# Patient Record
Sex: Female | Born: 1965 | Race: White | Hispanic: No | Marital: Married | Smoking: Never smoker
Health system: Southern US, Community
[De-identification: ages and names within clinical notes are randomized; demographics above are authoritative.]

## PROBLEM LIST (undated history)

## (undated) DIAGNOSIS — Z8049 Family history of malignant neoplasm of other genital organs: Secondary | ICD-10-CM

## (undated) DIAGNOSIS — J189 Pneumonia, unspecified organism: Secondary | ICD-10-CM

## (undated) DIAGNOSIS — R87610 Atypical squamous cells of undetermined significance on cytologic smear of cervix (ASC-US): Secondary | ICD-10-CM

## (undated) DIAGNOSIS — R51 Headache: Secondary | ICD-10-CM

## (undated) DIAGNOSIS — Z923 Personal history of irradiation: Secondary | ICD-10-CM

## (undated) DIAGNOSIS — Z8489 Family history of other specified conditions: Secondary | ICD-10-CM

## (undated) DIAGNOSIS — Z9889 Other specified postprocedural states: Secondary | ICD-10-CM

## (undated) DIAGNOSIS — C801 Malignant (primary) neoplasm, unspecified: Secondary | ICD-10-CM

## (undated) DIAGNOSIS — R519 Headache, unspecified: Secondary | ICD-10-CM

## (undated) DIAGNOSIS — R896 Abnormal cytological findings in specimens from other organs, systems and tissues: Secondary | ICD-10-CM

## (undated) DIAGNOSIS — E7800 Pure hypercholesterolemia, unspecified: Secondary | ICD-10-CM

## (undated) DIAGNOSIS — T8859XA Other complications of anesthesia, initial encounter: Secondary | ICD-10-CM

## (undated) DIAGNOSIS — G935 Compression of brain: Secondary | ICD-10-CM

## (undated) DIAGNOSIS — E039 Hypothyroidism, unspecified: Secondary | ICD-10-CM

## (undated) DIAGNOSIS — M810 Age-related osteoporosis without current pathological fracture: Secondary | ICD-10-CM

## (undated) DIAGNOSIS — Z9221 Personal history of antineoplastic chemotherapy: Secondary | ICD-10-CM

## (undated) DIAGNOSIS — R112 Nausea with vomiting, unspecified: Secondary | ICD-10-CM

## (undated) DIAGNOSIS — G8929 Other chronic pain: Secondary | ICD-10-CM

## (undated) DIAGNOSIS — IMO0001 Reserved for inherently not codable concepts without codable children: Secondary | ICD-10-CM

## (undated) DIAGNOSIS — Z853 Personal history of malignant neoplasm of breast: Secondary | ICD-10-CM

## (undated) HISTORY — PX: BREAST EXCISIONAL BIOPSY: SUR124

## (undated) HISTORY — DX: Hypothyroidism, unspecified: E03.9

## (undated) HISTORY — DX: Abnormal cytological findings in specimens from other organs, systems and tissues: R89.6

## (undated) HISTORY — DX: Age-related osteoporosis without current pathological fracture: M81.0

## (undated) HISTORY — DX: Pure hypercholesterolemia, unspecified: E78.00

## (undated) HISTORY — PX: UPPER GI ENDOSCOPY: SHX6162

## (undated) HISTORY — PX: COLONOSCOPY: SHX174

## (undated) HISTORY — PX: PELVIC LAPAROSCOPY: SHX162

## (undated) HISTORY — DX: Personal history of malignant neoplasm of breast: Z85.3

## (undated) HISTORY — DX: Other chronic pain: G89.29

## (undated) HISTORY — PX: UPPER GASTROINTESTINAL ENDOSCOPY: SHX188

## (undated) HISTORY — DX: Headache, unspecified: R51.9

## (undated) HISTORY — DX: Atypical squamous cells of undetermined significance on cytologic smear of cervix (ASC-US): R87.610

## (undated) HISTORY — DX: Malignant (primary) neoplasm, unspecified: C80.1

## (undated) HISTORY — PX: COLPOSCOPY: SHX161

## (undated) HISTORY — DX: Headache: R51

## (undated) HISTORY — DX: Family history of malignant neoplasm of other genital organs: Z80.49

## (undated) HISTORY — PX: TONSILLECTOMY: SUR1361

## (undated) HISTORY — DX: Compression of brain: G93.5

## (undated) HISTORY — PX: BREAST BIOPSY: SHX20

## (undated) HISTORY — DX: Reserved for inherently not codable concepts without codable children: IMO0001

---
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
## 1998-02-10 DIAGNOSIS — Z853 Personal history of malignant neoplasm of breast: Secondary | ICD-10-CM

## 1998-02-10 HISTORY — DX: Personal history of malignant neoplasm of breast: Z85.3

## 1998-12-24 ENCOUNTER — Other Ambulatory Visit: Admission: RE | Admit: 1998-12-24 | Discharge: 1998-12-24 | Payer: Self-pay | Admitting: Gynecology

## 1999-01-10 ENCOUNTER — Other Ambulatory Visit: Admission: RE | Admit: 1999-01-10 | Discharge: 1999-01-10 | Payer: Self-pay | Admitting: *Deleted

## 1999-01-10 ENCOUNTER — Encounter (INDEPENDENT_AMBULATORY_CARE_PROVIDER_SITE_OTHER): Payer: Self-pay | Admitting: Specialist

## 1999-01-15 ENCOUNTER — Encounter (INDEPENDENT_AMBULATORY_CARE_PROVIDER_SITE_OTHER): Payer: Self-pay | Admitting: *Deleted

## 1999-01-15 ENCOUNTER — Ambulatory Visit (HOSPITAL_COMMUNITY): Admission: RE | Admit: 1999-01-15 | Discharge: 1999-01-15 | Payer: Self-pay | Admitting: *Deleted

## 1999-02-11 DIAGNOSIS — C801 Malignant (primary) neoplasm, unspecified: Secondary | ICD-10-CM

## 1999-02-11 DIAGNOSIS — Z9221 Personal history of antineoplastic chemotherapy: Secondary | ICD-10-CM

## 1999-02-11 DIAGNOSIS — Z923 Personal history of irradiation: Secondary | ICD-10-CM

## 1999-02-11 HISTORY — PX: BREAST LUMPECTOMY: SHX2

## 1999-02-11 HISTORY — DX: Personal history of antineoplastic chemotherapy: Z92.21

## 1999-02-11 HISTORY — DX: Personal history of irradiation: Z92.3

## 1999-02-11 HISTORY — DX: Malignant (primary) neoplasm, unspecified: C80.1

## 1999-02-18 ENCOUNTER — Encounter: Admission: RE | Admit: 1999-02-18 | Discharge: 1999-05-19 | Payer: Self-pay | Admitting: Radiation Oncology

## 1999-03-08 ENCOUNTER — Ambulatory Visit (HOSPITAL_COMMUNITY): Admission: RE | Admit: 1999-03-08 | Discharge: 1999-03-08 | Payer: Self-pay | Admitting: *Deleted

## 1999-03-15 ENCOUNTER — Encounter: Admission: RE | Admit: 1999-03-15 | Discharge: 1999-03-15 | Payer: Self-pay | Admitting: Hematology & Oncology

## 1999-03-15 ENCOUNTER — Encounter: Payer: Self-pay | Admitting: Hematology & Oncology

## 1999-09-04 ENCOUNTER — Ambulatory Visit (HOSPITAL_COMMUNITY): Admission: RE | Admit: 1999-09-04 | Discharge: 1999-09-04 | Payer: Self-pay | Admitting: General Surgery

## 1999-09-11 ENCOUNTER — Encounter: Admission: RE | Admit: 1999-09-11 | Discharge: 1999-12-10 | Payer: Self-pay | Admitting: Radiation Oncology

## 2000-01-21 ENCOUNTER — Other Ambulatory Visit: Admission: RE | Admit: 2000-01-21 | Discharge: 2000-01-21 | Payer: Self-pay | Admitting: Gynecology

## 2000-04-22 ENCOUNTER — Encounter (INDEPENDENT_AMBULATORY_CARE_PROVIDER_SITE_OTHER): Payer: Self-pay

## 2000-04-22 ENCOUNTER — Other Ambulatory Visit: Admission: RE | Admit: 2000-04-22 | Discharge: 2000-04-22 | Payer: Self-pay | Admitting: Gynecology

## 2000-06-23 ENCOUNTER — Ambulatory Visit: Admission: RE | Admit: 2000-06-23 | Discharge: 2000-09-21 | Payer: Self-pay | Admitting: Radiation Oncology

## 2000-07-20 ENCOUNTER — Other Ambulatory Visit: Admission: RE | Admit: 2000-07-20 | Discharge: 2000-07-20 | Payer: Self-pay | Admitting: Gynecology

## 2001-01-26 ENCOUNTER — Other Ambulatory Visit: Admission: RE | Admit: 2001-01-26 | Discharge: 2001-01-26 | Payer: Self-pay | Admitting: Gynecology

## 2001-02-16 ENCOUNTER — Encounter: Admission: RE | Admit: 2001-02-16 | Discharge: 2001-02-16 | Payer: Self-pay | Admitting: Hematology & Oncology

## 2001-02-16 ENCOUNTER — Encounter: Payer: Self-pay | Admitting: Hematology & Oncology

## 2001-10-04 ENCOUNTER — Encounter: Admission: RE | Admit: 2001-10-04 | Discharge: 2001-10-04 | Payer: Self-pay | Admitting: *Deleted

## 2001-10-27 ENCOUNTER — Encounter: Payer: Self-pay | Admitting: Hematology & Oncology

## 2001-10-27 ENCOUNTER — Encounter: Admission: RE | Admit: 2001-10-27 | Discharge: 2001-10-27 | Payer: Self-pay | Admitting: Hematology & Oncology

## 2001-12-08 ENCOUNTER — Encounter: Payer: Self-pay | Admitting: Hematology & Oncology

## 2001-12-08 ENCOUNTER — Encounter: Admission: RE | Admit: 2001-12-08 | Discharge: 2001-12-08 | Payer: Self-pay | Admitting: Hematology & Oncology

## 2002-02-07 ENCOUNTER — Other Ambulatory Visit: Admission: RE | Admit: 2002-02-07 | Discharge: 2002-02-07 | Payer: Self-pay | Admitting: Gynecology

## 2002-02-15 ENCOUNTER — Ambulatory Visit (HOSPITAL_BASED_OUTPATIENT_CLINIC_OR_DEPARTMENT_OTHER): Admission: RE | Admit: 2002-02-15 | Discharge: 2002-02-15 | Payer: Self-pay | Admitting: *Deleted

## 2002-02-15 ENCOUNTER — Encounter (INDEPENDENT_AMBULATORY_CARE_PROVIDER_SITE_OTHER): Payer: Self-pay | Admitting: *Deleted

## 2002-07-28 ENCOUNTER — Encounter: Admission: RE | Admit: 2002-07-28 | Discharge: 2002-07-28 | Payer: Self-pay | Admitting: Hematology & Oncology

## 2002-07-28 ENCOUNTER — Encounter: Payer: Self-pay | Admitting: Hematology & Oncology

## 2003-02-08 ENCOUNTER — Other Ambulatory Visit: Admission: RE | Admit: 2003-02-08 | Discharge: 2003-02-08 | Payer: Self-pay | Admitting: Gynecology

## 2004-02-27 ENCOUNTER — Other Ambulatory Visit: Admission: RE | Admit: 2004-02-27 | Discharge: 2004-02-27 | Payer: Self-pay | Admitting: Gynecology

## 2004-03-20 ENCOUNTER — Ambulatory Visit: Payer: Self-pay | Admitting: Hematology & Oncology

## 2004-03-27 ENCOUNTER — Encounter: Admission: RE | Admit: 2004-03-27 | Discharge: 2004-03-27 | Payer: Self-pay | Admitting: Hematology & Oncology

## 2004-09-17 ENCOUNTER — Ambulatory Visit: Payer: Self-pay | Admitting: Hematology & Oncology

## 2004-09-24 ENCOUNTER — Ambulatory Visit (HOSPITAL_COMMUNITY): Admission: RE | Admit: 2004-09-24 | Discharge: 2004-09-24 | Payer: Self-pay | Admitting: Hematology & Oncology

## 2005-02-28 ENCOUNTER — Other Ambulatory Visit: Admission: RE | Admit: 2005-02-28 | Discharge: 2005-02-28 | Payer: Self-pay | Admitting: Gynecology

## 2005-03-18 ENCOUNTER — Ambulatory Visit: Payer: Self-pay | Admitting: Hematology & Oncology

## 2005-09-10 ENCOUNTER — Encounter: Admission: RE | Admit: 2005-09-10 | Discharge: 2005-09-10 | Payer: Self-pay | Admitting: Hematology & Oncology

## 2005-09-15 ENCOUNTER — Ambulatory Visit: Payer: Self-pay | Admitting: Hematology & Oncology

## 2005-09-17 LAB — CBC WITH DIFFERENTIAL/PLATELET
BASO%: 0.3 % (ref 0.0–2.0)
Basophils Absolute: 0 10*3/uL (ref 0.0–0.1)
EOS%: 1.7 % (ref 0.0–7.0)
HGB: 13.6 g/dL (ref 11.6–15.9)
MCH: 31.3 pg (ref 26.0–34.0)
MCV: 91.4 fL (ref 81.0–101.0)
MONO%: 6 % (ref 0.0–13.0)
RBC: 4.35 10*6/uL (ref 3.70–5.32)
RDW: 13.4 % (ref 11.3–14.5)
lymph#: 2.5 10*3/uL (ref 0.9–3.3)

## 2005-09-17 LAB — COMPREHENSIVE METABOLIC PANEL
ALT: 16 U/L (ref 0–40)
AST: 17 U/L (ref 0–37)
Calcium: 9.9 mg/dL (ref 8.4–10.5)
Chloride: 105 mEq/L (ref 96–112)
Creatinine, Ser: 0.86 mg/dL (ref 0.40–1.20)
Potassium: 4.3 mEq/L (ref 3.5–5.3)

## 2005-11-08 ENCOUNTER — Emergency Department (HOSPITAL_COMMUNITY): Admission: EM | Admit: 2005-11-08 | Discharge: 2005-11-08 | Payer: Self-pay | Admitting: Emergency Medicine

## 2006-03-02 ENCOUNTER — Other Ambulatory Visit: Admission: RE | Admit: 2006-03-02 | Discharge: 2006-03-02 | Payer: Self-pay | Admitting: Gynecology

## 2006-03-23 ENCOUNTER — Ambulatory Visit: Payer: Self-pay | Admitting: Hematology & Oncology

## 2006-03-25 LAB — COMPREHENSIVE METABOLIC PANEL
Albumin: 4.6 g/dL (ref 3.5–5.2)
BUN: 16 mg/dL (ref 6–23)
CO2: 27 mEq/L (ref 19–32)
Calcium: 9.5 mg/dL (ref 8.4–10.5)
Glucose, Bld: 92 mg/dL (ref 70–99)
Potassium: 4.3 mEq/L (ref 3.5–5.3)
Sodium: 142 mEq/L (ref 135–145)
Total Protein: 7 g/dL (ref 6.0–8.3)

## 2006-03-25 LAB — CBC WITH DIFFERENTIAL/PLATELET
Basophils Absolute: 0 10*3/uL (ref 0.0–0.1)
Eosinophils Absolute: 0 10*3/uL (ref 0.0–0.5)
HGB: 13.5 g/dL (ref 11.6–15.9)
MONO#: 0.3 10*3/uL (ref 0.1–0.9)
NEUT#: 1.8 10*3/uL (ref 1.5–6.5)
Platelets: 220 10*3/uL (ref 145–400)
RBC: 4.3 10*6/uL (ref 3.70–5.32)
RDW: 12.8 % (ref 11.3–14.5)
WBC: 4.8 10*3/uL (ref 3.9–10.0)

## 2006-03-25 LAB — LACTATE DEHYDROGENASE: LDH: 144 U/L (ref 94–250)

## 2006-06-10 ENCOUNTER — Other Ambulatory Visit: Admission: RE | Admit: 2006-06-10 | Discharge: 2006-06-10 | Payer: Self-pay | Admitting: Gynecology

## 2006-09-21 ENCOUNTER — Ambulatory Visit: Payer: Self-pay | Admitting: Hematology & Oncology

## 2006-09-23 LAB — COMPREHENSIVE METABOLIC PANEL
ALT: 14 U/L (ref 0–35)
CO2: 30 mEq/L (ref 19–32)
Potassium: 4.2 mEq/L (ref 3.5–5.3)
Sodium: 140 mEq/L (ref 135–145)
Total Bilirubin: 0.5 mg/dL (ref 0.3–1.2)
Total Protein: 6.7 g/dL (ref 6.0–8.3)

## 2006-09-23 LAB — CBC WITH DIFFERENTIAL/PLATELET
BASO%: 0.6 % (ref 0.0–2.0)
LYMPH%: 44.9 % (ref 14.0–48.0)
MCHC: 35.9 g/dL (ref 32.0–36.0)
MONO#: 0.4 10*3/uL (ref 0.1–0.9)
RBC: 4.2 10*6/uL (ref 3.70–5.32)
RDW: 13.6 % (ref 11.3–14.5)
WBC: 5.6 10*3/uL (ref 3.9–10.0)
lymph#: 2.5 10*3/uL (ref 0.9–3.3)

## 2006-09-23 LAB — LACTATE DEHYDROGENASE: LDH: 148 U/L (ref 94–250)

## 2006-11-04 ENCOUNTER — Encounter: Admission: RE | Admit: 2006-11-04 | Discharge: 2006-11-04 | Payer: Self-pay | Admitting: Hematology & Oncology

## 2006-11-06 ENCOUNTER — Encounter: Admission: RE | Admit: 2006-11-06 | Discharge: 2006-11-06 | Payer: Self-pay | Admitting: Gynecology

## 2006-11-17 ENCOUNTER — Encounter (INDEPENDENT_AMBULATORY_CARE_PROVIDER_SITE_OTHER): Payer: Self-pay | Admitting: *Deleted

## 2006-11-17 ENCOUNTER — Ambulatory Visit (HOSPITAL_COMMUNITY): Admission: RE | Admit: 2006-11-17 | Discharge: 2006-11-17 | Payer: Self-pay | Admitting: *Deleted

## 2007-03-04 ENCOUNTER — Other Ambulatory Visit: Admission: RE | Admit: 2007-03-04 | Discharge: 2007-03-04 | Payer: Self-pay | Admitting: Gynecology

## 2007-03-25 ENCOUNTER — Ambulatory Visit: Payer: Self-pay | Admitting: Hematology & Oncology

## 2007-03-29 LAB — COMPREHENSIVE METABOLIC PANEL
AST: 20 U/L (ref 0–37)
Albumin: 4.6 g/dL (ref 3.5–5.2)
Alkaline Phosphatase: 63 U/L (ref 39–117)
BUN: 20 mg/dL (ref 6–23)
Calcium: 9.7 mg/dL (ref 8.4–10.5)
Chloride: 107 mEq/L (ref 96–112)
Glucose, Bld: 80 mg/dL (ref 70–99)
Potassium: 5.4 mEq/L — ABNORMAL HIGH (ref 3.5–5.3)
Sodium: 143 mEq/L (ref 135–145)
Total Protein: 7 g/dL (ref 6.0–8.3)

## 2007-03-29 LAB — CBC WITH DIFFERENTIAL/PLATELET
Basophils Absolute: 0 10*3/uL (ref 0.0–0.1)
EOS%: 1.3 % (ref 0.0–7.0)
Eosinophils Absolute: 0.1 10*3/uL (ref 0.0–0.5)
HGB: 13.1 g/dL (ref 11.6–15.9)
NEUT#: 2.7 10*3/uL (ref 1.5–6.5)
RBC: 4.35 10*6/uL (ref 3.70–5.32)
RDW: 12.9 % (ref 11.3–14.5)
lymph#: 2.9 10*3/uL (ref 0.9–3.3)

## 2007-09-16 ENCOUNTER — Ambulatory Visit: Payer: Self-pay | Admitting: Hematology & Oncology

## 2007-09-20 LAB — CBC WITH DIFFERENTIAL (CANCER CENTER ONLY)
BASO#: 0 10*3/uL (ref 0.0–0.2)
Eosinophils Absolute: 0.1 10*3/uL (ref 0.0–0.5)
HGB: 14.4 g/dL (ref 11.6–15.9)
LYMPH#: 2.2 10*3/uL (ref 0.9–3.3)
MONO#: 0.3 10*3/uL (ref 0.1–0.9)
MONO%: 7 % (ref 0.0–13.0)
NEUT#: 2 10*3/uL (ref 1.5–6.5)
Platelets: 213 10*3/uL (ref 145–400)
RBC: 4.64 10*6/uL (ref 3.70–5.32)
WBC: 4.7 10*3/uL (ref 3.9–10.0)

## 2007-09-20 LAB — COMPREHENSIVE METABOLIC PANEL
Albumin: 4.7 g/dL (ref 3.5–5.2)
BUN: 16 mg/dL (ref 6–23)
CO2: 26 mEq/L (ref 19–32)
Calcium: 9.7 mg/dL (ref 8.4–10.5)
Chloride: 106 mEq/L (ref 96–112)
Creatinine, Ser: 0.89 mg/dL (ref 0.40–1.20)
Glucose, Bld: 112 mg/dL — ABNORMAL HIGH (ref 70–99)

## 2007-09-21 LAB — VITAMIN D 25 HYDROXY (VIT D DEFICIENCY, FRACTURES): Vit D, 25-Hydroxy: 50 ng/mL (ref 30–89)

## 2008-01-18 ENCOUNTER — Emergency Department (HOSPITAL_COMMUNITY): Admission: EM | Admit: 2008-01-18 | Discharge: 2008-01-18 | Payer: Self-pay | Admitting: Emergency Medicine

## 2008-03-06 ENCOUNTER — Other Ambulatory Visit: Admission: RE | Admit: 2008-03-06 | Discharge: 2008-03-06 | Payer: Self-pay | Admitting: Gynecology

## 2008-03-06 ENCOUNTER — Ambulatory Visit: Payer: Self-pay | Admitting: Gynecology

## 2008-03-06 ENCOUNTER — Encounter: Payer: Self-pay | Admitting: Gynecology

## 2008-04-12 ENCOUNTER — Ambulatory Visit: Payer: Self-pay | Admitting: Hematology & Oncology

## 2008-05-05 LAB — CBC WITH DIFFERENTIAL (CANCER CENTER ONLY)
EOS%: 3 % (ref 0.0–7.0)
HGB: 13.7 g/dL (ref 11.6–15.9)
LYMPH#: 1.8 10*3/uL (ref 0.9–3.3)
MCH: 31.2 pg (ref 26.0–34.0)
MCHC: 33.8 g/dL (ref 32.0–36.0)
MONO%: 4.8 % (ref 0.0–13.0)
NEUT#: 4.1 10*3/uL (ref 1.5–6.5)
Platelets: 247 10*3/uL (ref 145–400)
RBC: 4.4 10*6/uL (ref 3.70–5.32)

## 2008-05-05 LAB — COMPREHENSIVE METABOLIC PANEL
Albumin: 4.4 g/dL (ref 3.5–5.2)
Alkaline Phosphatase: 92 U/L (ref 39–117)
CO2: 29 mEq/L (ref 19–32)
Glucose, Bld: 153 mg/dL — ABNORMAL HIGH (ref 70–99)
Potassium: 5.3 mEq/L (ref 3.5–5.3)
Sodium: 147 mEq/L — ABNORMAL HIGH (ref 135–145)
Total Protein: 6.9 g/dL (ref 6.0–8.3)

## 2008-10-24 ENCOUNTER — Ambulatory Visit: Payer: Self-pay | Admitting: Hematology & Oncology

## 2008-10-25 LAB — CBC WITH DIFFERENTIAL (CANCER CENTER ONLY)
BASO#: 0 10*3/uL (ref 0.0–0.2)
BASO%: 0.7 % (ref 0.0–2.0)
EOS%: 2.5 % (ref 0.0–7.0)
HGB: 13 g/dL (ref 11.6–15.9)
LYMPH#: 3 10*3/uL (ref 0.9–3.3)
MCH: 30.9 pg (ref 26.0–34.0)
MCHC: 34.5 g/dL (ref 32.0–36.0)
MONO%: 5.3 % (ref 0.0–13.0)
NEUT#: 2.8 10*3/uL (ref 1.5–6.5)
NEUT%: 44.1 % (ref 39.6–80.0)
RDW: 12.4 % (ref 10.5–14.6)

## 2008-10-26 LAB — COMPREHENSIVE METABOLIC PANEL
Alkaline Phosphatase: 86 U/L (ref 39–117)
BUN: 16 mg/dL (ref 6–23)
Creatinine, Ser: 0.74 mg/dL (ref 0.40–1.20)
Glucose, Bld: 126 mg/dL — ABNORMAL HIGH (ref 70–99)
Total Bilirubin: 0.3 mg/dL (ref 0.3–1.2)

## 2009-03-07 ENCOUNTER — Ambulatory Visit: Payer: Self-pay | Admitting: Gynecology

## 2009-03-07 ENCOUNTER — Other Ambulatory Visit: Admission: RE | Admit: 2009-03-07 | Discharge: 2009-03-07 | Payer: Self-pay | Admitting: Gynecology

## 2009-03-12 ENCOUNTER — Ambulatory Visit: Payer: Self-pay | Admitting: Gynecology

## 2009-04-24 ENCOUNTER — Ambulatory Visit: Payer: Self-pay | Admitting: Hematology & Oncology

## 2009-04-25 LAB — COMPREHENSIVE METABOLIC PANEL
Albumin: 4.7 g/dL (ref 3.5–5.2)
Alkaline Phosphatase: 91 U/L (ref 39–117)
BUN: 18 mg/dL (ref 6–23)
CO2: 27 mEq/L (ref 19–32)
Glucose, Bld: 140 mg/dL — ABNORMAL HIGH (ref 70–99)
Potassium: 3.9 mEq/L (ref 3.5–5.3)
Total Protein: 7.2 g/dL (ref 6.0–8.3)

## 2009-04-25 LAB — CBC WITH DIFFERENTIAL (CANCER CENTER ONLY)
BASO#: 0.1 10*3/uL (ref 0.0–0.2)
Eosinophils Absolute: 0.2 10*3/uL (ref 0.0–0.5)
HGB: 13.8 g/dL (ref 11.6–15.9)
LYMPH%: 25.3 % (ref 14.0–48.0)
MCH: 29.8 pg (ref 26.0–34.0)
MCV: 91 fL (ref 81–101)
MONO#: 0.5 10*3/uL (ref 0.1–0.9)
MONO%: 5.3 % (ref 0.0–13.0)
NEUT#: 6.5 10*3/uL (ref 1.5–6.5)
RBC: 4.62 10*6/uL (ref 3.70–5.32)
WBC: 9.7 10*3/uL (ref 3.9–10.0)

## 2009-04-25 LAB — VITAMIN D 25 HYDROXY (VIT D DEFICIENCY, FRACTURES): Vit D, 25-Hydroxy: 32 ng/mL (ref 30–89)

## 2009-09-24 ENCOUNTER — Ambulatory Visit: Payer: Self-pay | Admitting: Hematology & Oncology

## 2009-09-26 LAB — CBC WITH DIFFERENTIAL (CANCER CENTER ONLY)
BASO#: 0 10*3/uL (ref 0.0–0.2)
EOS%: 2.5 % (ref 0.0–7.0)
HCT: 38.4 % (ref 34.8–46.6)
HGB: 13.3 g/dL (ref 11.6–15.9)
LYMPH%: 42 % (ref 14.0–48.0)
MCH: 31.2 pg (ref 26.0–34.0)
MCHC: 34.6 g/dL (ref 32.0–36.0)
MCV: 90 fL (ref 81–101)
MONO%: 6.4 % (ref 0.0–13.0)
NEUT#: 2.5 10*3/uL (ref 1.5–6.5)
NEUT%: 48.5 % (ref 39.6–80.0)

## 2009-09-27 LAB — COMPREHENSIVE METABOLIC PANEL
AST: 17 U/L (ref 0–37)
Alkaline Phosphatase: 73 U/L (ref 39–117)
BUN: 17 mg/dL (ref 6–23)
Creatinine, Ser: 0.86 mg/dL (ref 0.40–1.20)

## 2009-11-13 ENCOUNTER — Ambulatory Visit: Payer: Self-pay | Admitting: Gynecology

## 2010-03-08 ENCOUNTER — Other Ambulatory Visit: Payer: Self-pay | Admitting: Gynecology

## 2010-03-08 ENCOUNTER — Ambulatory Visit
Admission: RE | Admit: 2010-03-08 | Discharge: 2010-03-08 | Payer: Self-pay | Source: Home / Self Care | Attending: Gynecology | Admitting: Gynecology

## 2010-03-08 ENCOUNTER — Other Ambulatory Visit (HOSPITAL_COMMUNITY)
Admission: RE | Admit: 2010-03-08 | Discharge: 2010-03-08 | Disposition: A | Discharge status: Home / Self Care | Payer: BC Managed Care – PPO | Source: Ambulatory Visit | Attending: Gynecology | Admitting: Gynecology

## 2010-03-08 DIAGNOSIS — Z124 Encounter for screening for malignant neoplasm of cervix: Secondary | ICD-10-CM | POA: Insufficient documentation

## 2010-04-02 ENCOUNTER — Encounter (INDEPENDENT_AMBULATORY_CARE_PROVIDER_SITE_OTHER): Payer: BC Managed Care – PPO

## 2010-04-02 DIAGNOSIS — M810 Age-related osteoporosis without current pathological fracture: Secondary | ICD-10-CM

## 2010-04-02 DIAGNOSIS — R823 Hemoglobinuria: Secondary | ICD-10-CM

## 2010-04-15 ENCOUNTER — Other Ambulatory Visit: Payer: Self-pay | Admitting: Hematology & Oncology

## 2010-04-15 DIAGNOSIS — Z1239 Encounter for other screening for malignant neoplasm of breast: Secondary | ICD-10-CM

## 2010-04-18 ENCOUNTER — Encounter (HOSPITAL_BASED_OUTPATIENT_CLINIC_OR_DEPARTMENT_OTHER): Payer: BC Managed Care – PPO | Admitting: Hematology & Oncology

## 2010-04-18 ENCOUNTER — Other Ambulatory Visit: Payer: Self-pay | Admitting: Hematology & Oncology

## 2010-04-18 DIAGNOSIS — Z853 Personal history of malignant neoplasm of breast: Secondary | ICD-10-CM

## 2010-04-18 DIAGNOSIS — C50919 Malignant neoplasm of unspecified site of unspecified female breast: Secondary | ICD-10-CM

## 2010-04-18 DIAGNOSIS — Z170 Estrogen receptor positive status [ER+]: Secondary | ICD-10-CM

## 2010-04-18 LAB — CBC WITH DIFFERENTIAL (CANCER CENTER ONLY)
BASO#: 0 10*3/uL (ref 0.0–0.2)
BASO%: 0.3 % (ref 0.0–2.0)
EOS%: 1.9 % (ref 0.0–7.0)
Eosinophils Absolute: 0.1 10*3/uL (ref 0.0–0.5)
HCT: 38 % (ref 34.8–46.6)
HGB: 12.9 g/dL (ref 11.6–15.9)
LYMPH#: 2.4 10*3/uL (ref 0.9–3.3)
LYMPH%: 41.3 % (ref 14.0–48.0)
MCH: 30.6 pg (ref 26.0–34.0)
MCHC: 33.9 g/dL (ref 32.0–36.0)
MCV: 90 fL (ref 81–101)
MONO#: 0.5 10*3/uL (ref 0.1–0.9)
MONO%: 8.3 % (ref 0.0–13.0)
NEUT#: 2.8 10*3/uL (ref 1.5–6.5)
NEUT%: 48.2 % (ref 39.6–80.0)
Platelets: 234 10*3/uL (ref 145–400)
RBC: 4.21 10*6/uL (ref 3.70–5.32)
RDW: 12.9 % (ref 11.1–15.7)
WBC: 5.8 10*3/uL (ref 3.9–10.0)

## 2010-04-19 LAB — COMPREHENSIVE METABOLIC PANEL
ALT: 29 U/L (ref 0–35)
AST: 22 U/L (ref 0–37)
Albumin: 4.5 g/dL (ref 3.5–5.2)
Alkaline Phosphatase: 67 U/L (ref 39–117)
Glucose, Bld: 104 mg/dL — ABNORMAL HIGH (ref 70–99)
Potassium: 3.8 mEq/L (ref 3.5–5.3)
Sodium: 141 mEq/L (ref 135–145)
Total Bilirubin: 0.3 mg/dL (ref 0.3–1.2)
Total Protein: 6.7 g/dL (ref 6.0–8.3)

## 2010-04-25 ENCOUNTER — Ambulatory Visit
Admission: RE | Admit: 2010-04-25 | Discharge: 2010-04-25 | Disposition: A | Discharge status: Home / Self Care | Payer: BC Managed Care – PPO | Source: Ambulatory Visit | Attending: Hematology & Oncology | Admitting: Hematology & Oncology

## 2010-04-25 DIAGNOSIS — Z1239 Encounter for other screening for malignant neoplasm of breast: Secondary | ICD-10-CM

## 2010-05-03 ENCOUNTER — Ambulatory Visit (INDEPENDENT_AMBULATORY_CARE_PROVIDER_SITE_OTHER): Payer: BC Managed Care – PPO | Admitting: Gynecology

## 2010-05-03 ENCOUNTER — Ambulatory Visit: Payer: BC Managed Care – PPO | Admitting: Gynecology

## 2010-05-03 DIAGNOSIS — N898 Other specified noninflammatory disorders of vagina: Secondary | ICD-10-CM

## 2010-05-03 DIAGNOSIS — L293 Anogenital pruritus, unspecified: Secondary | ICD-10-CM

## 2010-05-03 DIAGNOSIS — B373 Candidiasis of vulva and vagina: Secondary | ICD-10-CM

## 2010-06-25 NOTE — Op Note (Signed)
Rachel [name], TOWLE                 ACCOUNT NO.:  [id number]   MEDICAL RECORD NO.:  [id number]          PATIENT TYPE:  AMB   LOCATION:  SDS                          FACILITY:  [facility]   PHYSICIAN:  Alfonse Ras, MD   DATE OF BIRTH:  [birth year]   DATE OF PROCEDURE:  DATE OF DISCHARGE:                               OPERATIVE REPORT   PREOPERATIVE DIAGNOSIS:  History of right-sided breast cancer now with  left-sided breast mass.   POSTOPERATIVE DIAGNOSIS:  History of right-sided breast cancer now with  left-sided breast mass.   PROCEDURE:  Left breast biopsy.   SURGEON:  Alfonse Ras, MD   ANESTHESIA:  Local MAC.   DESCRIPTION:  The patient was taken to the operating room and placed in  supine position.  After adequate MAC anesthesia was induced using MAC  technique, the left breast was prepped and draped in the normal sterile  fashion.  Using 1% lidocaine local anesthesia was induced.  Subcutaneous  tissue overlying the superficial palpable mass in the left lateral  aspect of the breast was anesthetized.  Curvilinear incision was made at  the areolar edge.  A very small palpable nodule was identified and  excised in its entirety.  Adequate hemostasis was ensured.  The skin was  closed with 4-0 Monocryl.  Steri-Strips and sterile dressings were  applied.  The patient tolerated the procedure well and went to PACU in  good condition.      Alfonse Ras, MD  Electronically Signed     KRE/MEDQ  D:  11/17/2006  T:  11/17/2006  Job:  [id number]

## 2010-06-28 NOTE — Op Note (Signed)
   NAME:  Rachel [name], Rachel [name]                           ACCOUNT NO.:  [id number]   MEDICAL RECORD NO.:  [id number]                   PATIENT TYPE:  AMB   LOCATION:  DSC                                  FACILITY:  [facility]   PHYSICIAN:  Vikki Ports, M.D.         DATE OF BIRTH:  [birth year]   DATE OF PROCEDURE:  02/15/2002  DATE OF DISCHARGE:                                 OPERATIVE REPORT   PREOPERATIVE DIAGNOSES:  History of right-sided breast cancer, right breast  mass.   POSTOPERATIVE DIAGNOSES:  History of right-sided breast cancer, right breast  mass.   PROCEDURE:  Excisional right breast biopsy.   ANESTHESIA:  Local MAC.   DESCRIPTION OF PROCEDURE:  The patient was taken to the operating room and  placed in the supine position.  After adequate MAC anesthesia was induced,  the right breast was prepped and draped in a normal sterile fashion.  The  skin and subcutaneous tissue overlying the mass in the upper outer quadrant  of the right breast was anesthetized.  A curvilinear incision was made,  dissecting down to the dermis in the subcutaneous tissue, taking it down and  around the palpable mass.  This was excised in its entirety and sent for  pathological evaluation.  Adequate hemostasis was assured.  The loose skin was closed with subcuticular #4-0 Monocryl.  Steri-Strips and  sterile dressings were applied.  The patient tolerated the procedure well and went to PACU in good condition.                                                Vikki Ports, M.D.    KRH/MEDQ  D:  02/15/2002  T:  02/15/2002  Job:  [id number]   cc:   Marcial Pacas P. Audie Box, M.D.  [address], [address]  [place]  [place] [address]  Fax: [phone]   Arlan Organ, M.D.

## 2010-06-28 NOTE — Op Note (Signed)
[facility]  Patient:    AMEYAH, BANGURA                        MRN: [id number] Proc. Date: 09/04/99 Adm. Date:  [id number] Disc. Date: [id number] Attending:  Tempie Donning                           Operative Report  OPERATION PERFORMED:  Removal Port-A-Cath.  SURGEON:  Dr. Maryagnes Amos.  ANESTHESIA:  MAC--IV sedation, local 1% xylocaine.  PREOPERATIVE DIAGNOSIS:  Breast cancer, Port-A-Cath used for chemotherapy and no longer needed.  POSTOPERATIVE DIAGNOSIS:  Breast cancer, Port-A-Cath used for chemotherapy and no longer needed.  OPERATION PERFORMED:  Under satisfactory intravenous sedation, the patient was positioned and prepped and draped in a standard fashion. One percent xylocaine was infiltrated throughout for good analgesia. The previous incision was excised and then the Port-A-Cath reservoir pocket entered. The 3 sutures holding it in place were removed and then the device with attached catheter removed intact without complication. The wound was lavaged with saline and suctioned dry and was hemostatic by cautery. The wound was then closed in layers with interrupted 3-0 Vicryl and Steri-Strips for skin. Sterile absorbent dressings applied and the patient went to the recovery room from the operating room in good condition without complication. DD:  09/13/99 TD:  09/16/99 Job: [id number]

## 2010-06-28 NOTE — Op Note (Signed)
[place]  Patient:    RHILYN BATTLE                         MRN: [id number] Proc. Date: 03/08/99 Adm. Date:  [id number] Disc. Date: [id number] Attending:  Stephenie Acres Dictator:   Catalina Lunger, M.D. CC:         Rose Phi. Myna Hidalgo, M.D.                           Operative Report  PREOPERATIVE DIAGNOSIS:  Right breast cancer, invasive.  POSTOPERATIVE DIAGNOSIS:  Right breast cancer, invasive.  PROCEDURE:  Placement of left subclavian single lumen Port-A-Cath.  SURGEON:  Dr. Lovie Chol.  ANESTHESIA:  Local MAC.  DESCRIPTION OF PROCEDURE:  The patient was taken to the operating room, placed n a supine position after adequate anesthesia was induced using MAC technique.  The  left chest and neck was prepped and draped in normal sterile fashion.  Using 1%  lidocaine local anesthesia this skin underlying the left clavicle was anesthetized. The periosteum and left clavicle was also anesthetized.  A separate area in the  left chest just superior to the left nipple areolar complex was also anesthetized down to the pectoralis fascia.  A left subclavian venipuncture was performed, and using Seldinger technique, the guide wire was placed into the superior vena cava. This was verified by fluoroscopy.  A dilator and sheath were then placed over the guide wire, and the separate pocket was made just superior to the nipple areolar complex.  The single lumen Port-A-Cath was placed within the pocket tunnel and brought out along side the dilator and sheath.  Dilator and guide wire were removed.  The Port-A-Cath was passed down through the sheath.  The sheath was removed.  Catheter was aspirated and flushed with concentrated heparin.  The Port-A-Cath was tacked to the pectoralis fascia using interrupted 2-0 Prolene sutures.  Skin incisions were closed in two layers using a subcutaneous 3-0 Vicryl, and a subcuticular 4-0 Monocryl.   Steri-Strips and sterile dressings were applied. The patient tolerated the procedure well.  The catheter was left accessed with  butterfly syringe.  The patient was taken to PACU in good condition where she awaits chest x-ray. DD:  03/08/99 TD:  03/09/99 Job: [id number]

## 2010-09-12 ENCOUNTER — Telehealth: Payer: Self-pay | Admitting: *Deleted

## 2010-09-12 DIAGNOSIS — B373 Candidiasis of vulva and vagina: Secondary | ICD-10-CM

## 2010-09-12 MED ORDER — FLUCONAZOLE 200 MG PO TABS: 200.0000 mg | ORAL_TABLET | Freq: Every day | ORAL | Status: AC

## 2010-09-12 NOTE — Telephone Encounter (Signed)
PT C/O YEAST INFECTION ITCHING, WHITE DISCHARGE, DISCOMFORT FEELS RAW X5 DAYS. PT STATES SHE GETS YEAST INFECTION FREQUENTLY AND SHE GETS RX GIVEN TO HER  IN THE PAST. PT WOULD LIKE RX. PLEASE ADVISE.

## 2010-09-12 NOTE — Telephone Encounter (Signed)
As patient tends to have resistant yeast infections we will treat with Diflucan 200 daily x3 days

## 2010-09-12 NOTE — Telephone Encounter (Signed)
PT INFORMED ABOUT THE BELOW RX.

## 2010-09-23 ENCOUNTER — Other Ambulatory Visit: Payer: Self-pay | Admitting: Hematology & Oncology

## 2010-09-23 ENCOUNTER — Encounter (HOSPITAL_BASED_OUTPATIENT_CLINIC_OR_DEPARTMENT_OTHER): Payer: BC Managed Care – PPO | Admitting: Hematology & Oncology

## 2010-09-23 DIAGNOSIS — Z853 Personal history of malignant neoplasm of breast: Secondary | ICD-10-CM

## 2010-09-23 LAB — CBC WITH DIFFERENTIAL (CANCER CENTER ONLY)
BASO%: 0.8 % (ref 0.0–2.0)
EOS%: 2.4 % (ref 0.0–7.0)
HCT: 39.8 % (ref 34.8–46.6)
LYMPH#: 2.5 10*3/uL (ref 0.9–3.3)
LYMPH%: 49.7 % — ABNORMAL HIGH (ref 14.0–48.0)
MCHC: 34.9 g/dL (ref 32.0–36.0)
MONO#: 0.4 10*3/uL (ref 0.1–0.9)
NEUT%: 40.1 % (ref 39.6–80.0)
Platelets: 201 10*3/uL (ref 145–400)
RDW: 13 % (ref 11.1–15.7)
WBC: 5 10*3/uL (ref 3.9–10.0)

## 2010-09-24 LAB — COMPREHENSIVE METABOLIC PANEL
CO2: 28 mEq/L (ref 19–32)
Creatinine, Ser: 0.77 mg/dL (ref 0.50–1.10)
Glucose, Bld: 87 mg/dL (ref 70–99)
Total Bilirubin: 0.5 mg/dL (ref 0.3–1.2)

## 2010-09-24 LAB — LIPID PANEL
Cholesterol: 241 mg/dL — ABNORMAL HIGH (ref 0–200)
HDL: 43 mg/dL (ref >39)
Total CHOL/HDL Ratio: 5.6 Ratio

## 2010-09-24 LAB — VITAMIN D 25 HYDROXY (VIT D DEFICIENCY, FRACTURES): Vit D, 25-Hydroxy: 81 ng/mL (ref 30–89)

## 2010-11-21 LAB — CBC
HCT: 41.3
Hemoglobin: 13.9
MCV: 92.2
RBC: 4.48
WBC: 6.8

## 2010-11-21 LAB — HCG, SERUM, QUALITATIVE: Preg, Serum: NEGATIVE

## 2011-01-31 ENCOUNTER — Telehealth: Payer: Self-pay | Admitting: *Deleted

## 2011-01-31 MED ORDER — FLUCONAZOLE 200 MG PO TABS: 200.0000 mg | ORAL_TABLET | Freq: Every day | ORAL | Status: AC

## 2011-01-31 NOTE — Telephone Encounter (Signed)
Pt calling c/o recurrent yeast infection. Itching,white discharge. Pt tried to use OTC but no relief. Pt would like rx to help. Please advise.

## 2011-01-31 NOTE — Telephone Encounter (Signed)
Pt informed with the below note. 

## 2011-01-31 NOTE — Telephone Encounter (Signed)
Recommend Diflucan 200 mg daily x3 days

## 2011-02-24 ENCOUNTER — Other Ambulatory Visit: Payer: Self-pay | Admitting: Dermatology

## 2011-03-04 DIAGNOSIS — E039 Hypothyroidism, unspecified: Secondary | ICD-10-CM | POA: Insufficient documentation

## 2011-03-04 DIAGNOSIS — C801 Malignant (primary) neoplasm, unspecified: Secondary | ICD-10-CM | POA: Insufficient documentation

## 2011-03-04 DIAGNOSIS — E7800 Pure hypercholesterolemia, unspecified: Secondary | ICD-10-CM | POA: Insufficient documentation

## 2011-03-04 DIAGNOSIS — M8580 Other specified disorders of bone density and structure, unspecified site: Secondary | ICD-10-CM | POA: Insufficient documentation

## 2011-03-04 DIAGNOSIS — IMO0001 Reserved for inherently not codable concepts without codable children: Secondary | ICD-10-CM | POA: Insufficient documentation

## 2011-03-10 ENCOUNTER — Encounter: Payer: Self-pay | Admitting: Gynecology

## 2011-03-10 ENCOUNTER — Ambulatory Visit (INDEPENDENT_AMBULATORY_CARE_PROVIDER_SITE_OTHER): Payer: BC Managed Care – PPO | Admitting: Gynecology

## 2011-03-10 VITALS — BP 116/74 | Ht 63.5 in | Wt 140.0 lb

## 2011-03-10 DIAGNOSIS — N951 Menopausal and female climacteric states: Secondary | ICD-10-CM

## 2011-03-10 DIAGNOSIS — Z01419 Encounter for gynecological examination (general) (routine) without abnormal findings: Secondary | ICD-10-CM

## 2011-03-10 DIAGNOSIS — Z131 Encounter for screening for diabetes mellitus: Secondary | ICD-10-CM

## 2011-03-10 DIAGNOSIS — Z13220 Encounter for screening for lipoid disorders: Secondary | ICD-10-CM

## 2011-03-10 LAB — URINALYSIS W MICROSCOPIC + REFLEX CULTURE
Bilirubin Urine: NEGATIVE
Specific Gravity, Urine: >1.03 — ABNORMAL HIGH (ref 1.005–1.030)
Urobilinogen, UA: 0.2 mg/dL (ref 0.0–1.0)
pH: 5.5 (ref 5.0–8.0)

## 2011-03-10 LAB — CBC WITH DIFFERENTIAL/PLATELET
Basophils Absolute: 0 10*3/uL (ref 0.0–0.1)
Basophils Relative: 0 % (ref 0–1)
MCHC: 33.2 g/dL (ref 30.0–36.0)
Neutro Abs: 2.7 10*3/uL (ref 1.7–7.7)
Neutrophils Relative %: 42 % — ABNORMAL LOW (ref 43–77)
Platelets: 240 10*3/uL (ref 150–400)
RDW: 13.2 % (ref 11.5–15.5)
WBC: 6.4 10*3/uL (ref 4.0–10.5)

## 2011-03-10 LAB — LIPID PANEL
Cholesterol: 267 mg/dL — ABNORMAL HIGH (ref 0–200)
VLDL: 48 mg/dL — ABNORMAL HIGH (ref 0–40)

## 2011-03-10 LAB — GLUCOSE, RANDOM: Glucose, Bld: 78 mg/dL (ref 70–99)

## 2011-03-10 MED ORDER — VENLAFAXINE HCL ER 37.5 MG PO CP24: 37.5000 mg | ORAL_CAPSULE | Freq: Every day | ORAL | Status: DC

## 2011-03-10 MED ORDER — ALENDRONATE SODIUM 70 MG PO TABS: 70.0000 mg | ORAL_TABLET | ORAL | Status: DC

## 2011-03-10 NOTE — Progress Notes (Signed)
Rachel [name] [facility] [birth year] [id number]        46 y.o.  for annual exam.  Several issues as noted below.  Past medical history,surgical history, medications, allergies, family history and social history were all reviewed and documented in the EPIC chart. ROS:  Was performed and pertinent positives and negatives are included in the history.  Exam: Kim chaperone present Filed Vitals:   03/10/11 1441  BP: 116/74   General appearance  Normal Skin grossly normal Head/Neck normal with no cervical or supraclavicular adenopathy thyroid normal Lungs  clear Cardiac RR, without RMG Abdominal  soft, nontender, without masses, organomegaly or hernia Breasts  examined lying and sitting without masses, retractions, discharge or axillary adenopathy. Pelvic  Ext/BUS/vagina  normal   Cervix  normal    Uterus  anteverted, normal size, shape and contour, midline and mobile nontender   Adnexa  Without masses or tenderness    Anus and perineum  normal   Rectovaginal  normal sphincter tone without palpated masses or tenderness.    Assessment/Plan:  46 y.o. female for annual exam.    1. History of breast cancer. Patient has history of stage I infiltrating ductal breast cancer treated 2001. NED since then, continues to see Dr. Myna Hidalgo every 6 months and will continue to do so. She is coming due for her mammogram in March I reminded her to schedule this. 2. Menopausal symptoms. She continues to have menopausal symptoms, primarily hot flashes. She remains amenorrheic and her last menstrual period was 2002 with significantly elevated FSH as in the past.  We again discussed options for management and ultimately she wants to go ahead and try Effexor and I prescribed Effexor 37.5 mg. To start at this dose times one month she'll call me again in a month we'll see how she's doing. Will increase to 75 mg as needed. I discussed side effect profile up to including suicide ideation. 3. Osteopenia. Patient's last DEXA February  2012 showed osteopenia with -2.3. She is on Fosamax. She started on Actonel in January 2010 and was switched to Fosamax due to her insurance issues. She had statistically significant improvement with her last DEXA she will continue on Fosamax for now and repeat her bone density next year and then decide if we want to consider a drug-free holiday at that point. 4. Pap smear. No Pap smear was done today. She has no history of significantly abnormal Pap smears noting an ASCUS Pap smear in 2008 with negative high-risk HPV and negative Pap smears since then. We'll plan on every 3 year Pap smears in accordance with current screening guidelines and she agrees with this. 5. Hypercholesterolemia. She had discussed this with her primary who had suggested treatment but she rejected this. I went and ordered a lipid profile today can provide her with the results to give to her primary. 6. Health maintenance. She is being followed by Dr. Lucianne Muss for her thyroid. I did baseline CBC glucose lipid profile and urinalysis. She will continue to see her primary for general health maintenance and Dr. Myna Hidalgo for her breast cancer.    Dara Lords MD, 3:44 PM 03/10/2011

## 2011-03-11 NOTE — Patient Instructions (Signed)
Start on Effexor as we discussed. Call me in one month in follow up.

## 2011-03-28 ENCOUNTER — Other Ambulatory Visit: Payer: BC Managed Care – PPO | Admitting: Lab

## 2011-03-28 LAB — CBC WITH DIFFERENTIAL/PLATELET
Eosinophils Absolute: 0.1 10*3/uL (ref 0.0–0.5)
HGB: 13.7 g/dL (ref 11.6–15.9)
LYMPH%: 38.1 % (ref 14.0–49.7)
MONO#: 0.5 10*3/uL (ref 0.1–0.9)
NEUT#: 3.8 10*3/uL (ref 1.5–6.5)
Platelets: 189 10*3/uL (ref 145–400)
RBC: 4.41 10*6/uL (ref 3.70–5.45)
RDW: 13.3 % (ref 11.2–14.5)
WBC: 7.1 10*3/uL (ref 3.9–10.3)

## 2011-03-29 LAB — LIPID PANEL: Cholesterol: 235 mg/dL — ABNORMAL HIGH (ref 0–200)

## 2011-03-29 LAB — COMPREHENSIVE METABOLIC PANEL
Albumin: 4.6 g/dL (ref 3.5–5.2)
CO2: 25 mEq/L (ref 19–32)
Glucose, Bld: 89 mg/dL (ref 70–99)
Potassium: 4.4 mEq/L (ref 3.5–5.3)
Sodium: 142 mEq/L (ref 135–145)
Total Protein: 6.9 g/dL (ref 6.0–8.3)

## 2011-03-29 LAB — LACTATE DEHYDROGENASE: LDH: 163 U/L (ref 94–250)

## 2011-03-31 ENCOUNTER — Ambulatory Visit (HOSPITAL_BASED_OUTPATIENT_CLINIC_OR_DEPARTMENT_OTHER): Payer: BC Managed Care – PPO | Admitting: Hematology & Oncology

## 2011-03-31 ENCOUNTER — Telehealth: Payer: Self-pay | Admitting: Hematology & Oncology

## 2011-03-31 ENCOUNTER — Encounter: Payer: Self-pay | Admitting: Hematology & Oncology

## 2011-03-31 DIAGNOSIS — M810 Age-related osteoporosis without current pathological fracture: Secondary | ICD-10-CM

## 2011-03-31 DIAGNOSIS — Z853 Personal history of malignant neoplasm of breast: Secondary | ICD-10-CM

## 2011-03-31 DIAGNOSIS — C50919 Malignant neoplasm of unspecified site of unspecified female breast: Secondary | ICD-10-CM | POA: Insufficient documentation

## 2011-03-31 DIAGNOSIS — E039 Hypothyroidism, unspecified: Secondary | ICD-10-CM

## 2011-03-31 NOTE — Progress Notes (Signed)
This office note has been dictated.

## 2011-03-31 NOTE — Progress Notes (Signed)
CC:   Timothy P. Fontaine, M.D. Rachel [name], M.D.  DIAGNOSIS:  Stage I (T1 N0 M0) ductal carcinoma of the right breast, clinical remission.  CURRENT THERAPY:  Observation.  INTERVAL HISTORY:  Rachel [name] comes in for followup.  As always, she is busy.  Both of her boys are in school now.  Her oldest is a Holiday representative at [employer].  Hopefully he will be working for the police department when he gets done.  Her youngest is a Printmaker at [employer]  He has had a little bit of a tough time in his engineering program but she is encouraging him just to "stick with it".  Her main health problem however is cholesterol.  We actually checked her cholesterol a week or so ago.  I was quite surprised as to how bad it was.  Her total cholesterol was 235.  Her triglycerides were 171.  Her LDL was quite high at 154 and her HDL was 47.  She said that no one in her family has cholesterol problems.  She watches what she eats.  She says she does not exercise and will try to make an effort.  She does not want to take statin drugs.  She is on Fish Oil.  Otherwise she is doing well.  She is on vitamin D.  We did check her vitamin D level and it was 41.  She has had no problems with cough or shortness of breath.  There has been no change in bowel or bladder habits.  She is now "postmenopausal" now for 12 years.  PHYSICAL EXAMINATION:  General:  This is a well-developed, well- nourished white female in no obvious distress.  Vital signs:  Show a temperature of 98, pulse is 68, respiratory rate 18, blood pressure is 118/70.  Her weight was 136.  Head and neck:  Exam showed normocephalic, atraumatic skull.  There are no ocular or oral lesions.  There are no palpable cervical or supraclavicular lymph nodes.  Lungs:  Clear bilaterally.  Cardiac:  Regular rate and rhythm with normal S1, S2.  No murmurs, rubs or bruits.  Abdomen:  Soft with good bowel sounds.  There was no palpable abdominal mass.  No palpable  hepatosplenomegaly. Breasts:  Exam shows left breast with no masses, edema or erythema. There is no left axillary adenopathy.  Right breast shows well-healed lumpectomy at about the 8 o'clock position.  There may be some slight contraction of the right breast.  There are no masses in the right breast.  There is no right axillary adenopathy.  Back:  Shows no tenderness over the spine, ribs or hips.  She has no kyphosis or osteoporotic changes.  Extremities:  Shows no clubbing, cyanosis or edema.  Neurological:  Exam shows no focal neurological deficits.  LABORATORY STUDIES:  White cell count is 7.1, hemoglobin 13.7, hematocrit 40.7, platelet count 189.  Her liver function tests were all normal.  BUN 24, creatinine 0.7.  IMPRESSION:  Rachel [name] is a very nice 45 year old white female with a past history of stage I infiltrating ductal carcinoma of the right breast.  She did undergo systemic chemotherapy with CMF.  She tolerated this well.  She was on tamoxifen.  She completed 5 years of tamoxifen in August 2006.  I am surprised by her cholesterol being high.  Typically we do not see that with her chemotherapy regimen.  In addition, tamoxifen has been shown to help with cholesterol issues.  She does see Dr. Audie Box at family  practice.  They are definitely on top of the cholesterol issue.  I will plan to get her back in another 6 months.  ADDENDUM:  I forgot to mention that Rachel [name] does have hypothyroidism. She is on Synthroid, which is being monitored by I think Dr. Lucianne Muss.    ______________________________ Josph Macho, M.D. PRE/MEDQ  D:  03/31/2011  T:  03/31/2011  Job:  [id number]

## 2011-03-31 NOTE — Telephone Encounter (Signed)
Mailed 09-25-11 schedule to patient

## 2011-04-01 ENCOUNTER — Encounter: Payer: Self-pay | Admitting: *Deleted

## 2011-04-01 NOTE — Progress Notes (Signed)
Per Dr. Myna Hidalgo, pt called, left message on pt's home phone that her cholesterol is to high.  Dr. Myna Hidalgo wants to know who her primary care MD is.  If she doesn't have one he will put her on Crestor 10mg  1 po daily.

## 2011-04-03 ENCOUNTER — Other Ambulatory Visit: Payer: BC Managed Care – PPO | Admitting: Lab

## 2011-04-03 ENCOUNTER — Ambulatory Visit: Payer: BC Managed Care – PPO | Admitting: Hematology & Oncology

## 2011-05-16 ENCOUNTER — Other Ambulatory Visit: Payer: Self-pay | Admitting: Hematology & Oncology

## 2011-05-16 DIAGNOSIS — Z1231 Encounter for screening mammogram for malignant neoplasm of breast: Secondary | ICD-10-CM

## 2011-05-24 ENCOUNTER — Other Ambulatory Visit: Payer: Self-pay | Admitting: Hematology & Oncology

## 2011-05-29 ENCOUNTER — Ambulatory Visit
Admission: RE | Admit: 2011-05-29 | Discharge: 2011-05-29 | Disposition: A | Discharge status: Home / Self Care | Payer: BC Managed Care – PPO | Source: Ambulatory Visit | Attending: Hematology & Oncology | Admitting: Hematology & Oncology

## 2011-05-29 DIAGNOSIS — Z1231 Encounter for screening mammogram for malignant neoplasm of breast: Secondary | ICD-10-CM

## 2011-08-19 ENCOUNTER — Telehealth: Payer: Self-pay | Admitting: *Deleted

## 2011-08-19 MED ORDER — FLUCONAZOLE 150 MG PO TABS: 150.0000 mg | ORAL_TABLET | Freq: Once | ORAL | Status: AC

## 2011-08-19 NOTE — Telephone Encounter (Signed)
Pt informed with the below note. 

## 2011-08-19 NOTE — Telephone Encounter (Signed)
Diflucan 150 mg #4. 1 by mouth when necessary for yeast.

## 2011-08-19 NOTE — Telephone Encounter (Signed)
Pt calling c/o recurrent yeast infection c/o itching, white discharge x 3 days now. Pt would like diflucan if possible,she asked for more than 1 pills. Please advise

## 2011-09-25 ENCOUNTER — Ambulatory Visit (HOSPITAL_BASED_OUTPATIENT_CLINIC_OR_DEPARTMENT_OTHER): Payer: BC Managed Care – PPO | Admitting: Hematology & Oncology

## 2011-09-25 ENCOUNTER — Other Ambulatory Visit (HOSPITAL_BASED_OUTPATIENT_CLINIC_OR_DEPARTMENT_OTHER): Payer: BC Managed Care – PPO | Admitting: Lab

## 2011-09-25 VITALS — BP 114/70 | HR 51 | Temp 97.0°F | Resp 18 | Ht 63.0 in | Wt 128.0 lb

## 2011-09-25 DIAGNOSIS — E785 Hyperlipidemia, unspecified: Secondary | ICD-10-CM

## 2011-09-25 DIAGNOSIS — M810 Age-related osteoporosis without current pathological fracture: Secondary | ICD-10-CM

## 2011-09-25 DIAGNOSIS — Z853 Personal history of malignant neoplasm of breast: Secondary | ICD-10-CM

## 2011-09-25 DIAGNOSIS — C50919 Malignant neoplasm of unspecified site of unspecified female breast: Secondary | ICD-10-CM

## 2011-09-25 LAB — CBC WITH DIFFERENTIAL (CANCER CENTER ONLY)
Eosinophils Absolute: 0.2 10*3/uL (ref 0.0–0.5)
HCT: 40.3 % (ref 34.8–46.6)
LYMPH%: 42.2 % (ref 14.0–48.0)
MCV: 91 fL (ref 81–101)
MONO#: 0.5 10*3/uL (ref 0.1–0.9)
NEUT%: 46.3 % (ref 39.6–80.0)
RBC: 4.45 10*6/uL (ref 3.70–5.32)
WBC: 5.5 10*3/uL (ref 3.9–10.0)

## 2011-09-25 NOTE — Progress Notes (Signed)
This office note has been dictated.

## 2011-09-26 LAB — COMPREHENSIVE METABOLIC PANEL
CO2: 30 mEq/L (ref 19–32)
Calcium: 9.2 mg/dL (ref 8.4–10.5)
Glucose, Bld: 89 mg/dL (ref 70–99)
Sodium: 142 mEq/L (ref 135–145)
Total Bilirubin: 0.5 mg/dL (ref 0.3–1.2)
Total Protein: 6.3 g/dL (ref 6.0–8.3)

## 2011-09-26 LAB — LIPID PANEL
Cholesterol: 237 mg/dL — ABNORMAL HIGH (ref 0–200)
Total CHOL/HDL Ratio: 5.2 Ratio

## 2011-09-26 LAB — VITAMIN D 25 HYDROXY (VIT D DEFICIENCY, FRACTURES): Vit D, 25-Hydroxy: 49 ng/mL (ref 30–89)

## 2011-09-26 NOTE — Progress Notes (Signed)
CC:   Claude Manges, MD Timothy P. Fontaine, M.D. Reather Littler, M.D.  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  CURRENT THERAPY:  Observation.  INTERIM HISTORY:  Ms. Labrecque comes in for followup.  She and I, as always, caught up on our families.  Her oldest son is out of college now.  He is going to be working for the [employer].  He goes to the police academy in November.  She was up in [place] at [employer].  She got to meet Wynonia Lawman.  This really is exciting and I am very jealous.  Her other son is a Medical laboratory scientific officer at [employer].  He seems to be doing okay.  She has lost weight.  She is exercising.  She is really doing her best to watch what she eats.  She has had problems with her cholesterol. She, however, is making an effort to bring this down without having to take any medications.  Hot flashes have been an occasional problem for her.  She was taking Effexor, which she did not think was working all that well.  She has not noted any problems with respect to her thyroid.  There has been no change in bowel or bladder habits.  Her last mammogram was in April of this year.  Everything looked okay with no evidence of suspicious findings.  She had a bone density done back in, I think, January.  She is on Fosamax weekly.  PHYSICAL EXAMINATION:  General:  This is a well-developed, well- nourished white female in no obvious distress.  Vital Signs: Temperature 97, pulse 51, respiratory rate 18, blood pressure 114/70, weight is 128.  Head and Neck Exam:  Shows a normocephalic, atraumatic skull.  There are no ocular or oral lesions.  There are no palpable cervical or supraclavicular lymph nodes.  Lungs:  Clear bilaterally. Cardiac Exam:  Regular rate and rhythm with a normal S1 and S2.  There are no murmurs, rubs, or bruits.  Breasts:  Shows left breast with no masses, edema, or erythema.  There is no left  axillary adenopathy. Right breast shows a well-healed lumpectomy at about the 8 o'clock position.  There is some slight contraction at the lumpectomy site. There is some slight firmness, but no distinct mass.  There is no right axillary adenopathy.  Back Exam:  Shows no kyphosis or osteoporotic changes.  Abdominal Exam:  Soft with good bowel sounds.  There is no fluid wave.  There is no palpable hepatosplenomegaly.  Extremities: Show no clubbing, cyanosis, or edema.  Skin Exam:  No rashes, ecchymoses, or petechia.  LABORATORY STUDIES:  White cell count 5.5, hemoglobin 13.6, hematocrit 40.3, platelet count 206.  Her electrolytes all are within normal limits.  Liver function tests are normal.  Glucose is 89.  Her lipid panel shows a cholesterol of 237.  LDL is 175 with an HDL of 409.  Her cholesterol/HDL ratio is 5.2.  IMPRESSION:  Rachel [name] is a 46 year old white female with history of stage I infiltrating ductal carcinoma of the right breast.  She underwent systemic chemotherapy with CMF.  This was, I think, completed back in 2001.  She then had 5 years of tamoxifen.  Her cholesterol is still on the high side.  Again, she does not want to take anything for this that is in a prescription form.  We may have to get her to her family doctor to help with this.  We will plan to see her back in 6 more months.  We do not need any blood work or lab work or x-rays in between visits.    ______________________________ Josph Macho, M.D. PRE/MEDQ  D:  09/25/2011  T:  09/26/2011  Job:  [id number]

## 2011-10-01 ENCOUNTER — Telehealth: Payer: Self-pay | Admitting: *Deleted

## 2011-10-01 NOTE — Telephone Encounter (Signed)
Pt called wanting to know her cholesterol results from her last visit. Returned call. No answer. Left message stating that her total cholesterol remains elevated along with her LDL. Good news is that her triglycerides have come down. Will need to see primary care per Dr Gustavo Lah discussion during her last visit.

## 2011-11-20 ENCOUNTER — Telehealth: Payer: Self-pay | Admitting: *Deleted

## 2011-11-20 NOTE — Telephone Encounter (Signed)
Message copied by Anselm Jungling on Thu Nov 20, 2011  1:30 PM ------      Message from: Arlan Organ R      Created: Thu Nov 20, 2011  7:22 AM       Please call and tell her that her cholesterol is still quite high. We will fax the results to her family Dr. 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 Thanks. Pete      Please make sure that her lab results are sent to her family Dr. Rudean Curt not sure who that is.

## 2011-11-20 NOTE — Telephone Encounter (Signed)
Called patient.  Left message on personal  Answering machine to have patient give our office a call about her blood work whenever you get a chance.

## 2011-11-20 NOTE — Telephone Encounter (Signed)
Message copied by Anselm Jungling on Thu Nov 20, 2011 12:38 PM ------      Message from: Arlan Organ R      Created: Thu Nov 20, 2011  7:22 AM       Please call and tell her that her cholesterol is still quite high. We will fax the results to her family Dr. 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 Thanks. Pete      Please make sure that her lab results are sent to her family Dr. Rudean Curt not sure who that is.

## 2011-11-20 NOTE — Telephone Encounter (Signed)
Called patient to tell her that her cholesterol was still quite high.  Results faxed to Dr. Lynnea Ferrier per dr. Myna Hidalgo request.  Reviewed numbers with patient

## 2012-03-11 ENCOUNTER — Ambulatory Visit (INDEPENDENT_AMBULATORY_CARE_PROVIDER_SITE_OTHER): Payer: BC Managed Care – PPO | Admitting: Gynecology

## 2012-03-11 ENCOUNTER — Encounter: Payer: Self-pay | Admitting: Gynecology

## 2012-03-11 VITALS — BP 112/66 | Ht 64.5 in | Wt 126.0 lb

## 2012-03-11 DIAGNOSIS — M8580 Other specified disorders of bone density and structure, unspecified site: Secondary | ICD-10-CM

## 2012-03-11 DIAGNOSIS — M899 Disorder of bone, unspecified: Secondary | ICD-10-CM

## 2012-03-11 DIAGNOSIS — Z01419 Encounter for gynecological examination (general) (routine) without abnormal findings: Secondary | ICD-10-CM

## 2012-03-11 NOTE — Progress Notes (Signed)
Rachel [name] [facility] November 20, 1965 [id number]        47 y.o.  [id number] for annual exam.  Several issues noted below.  Past medical history,surgical history, medications, allergies, family history and social history were all reviewed and documented in the EPIC chart. ROS:  Was performed and pertinent positives and negatives are included in the history.  Exam: Kim assistant Filed Vitals:   03/11/12 1533  BP: 112/66  Height: 5' 4.5" (1.638 m)  Weight: 126 lb (57.153 kg)   General appearance  Normal Skin grossly normal Head/Neck normal with no cervical or supraclavicular adenopathy thyroid normal Lungs  clear Cardiac RR, without RMG Abdominal  soft, nontender, without masses, organomegaly or hernia Breasts  examined lying and sitting without masses, retractions, discharge or axillary adenopathy. Pelvic  Ext/BUS/vagina  normal with mild atrophic changes  Cervix  normal   Uterus  anteverted, normal size, shape and contour, midline and mobile nontender   Adnexa  Without masses or tenderness    Anus and perineum  normal   Rectovaginal  normal sphincter tone without palpated masses or tenderness.    Assessment/Plan:  47 y.o. [id number] female for annual exam.   1. History of breast cancer. Patient has history of stage I infiltrating ductal breast cancer treated 2001. NED since then, continues to see Dr. Myna Hidalgo. Mammogram 05/2011. We'll continue with annual mammography. SBE monthly reviewed. 2. Menopausal symptoms. Patient continues to have some menopausal symptoms. Had talked about Effexor last year but she is not using this and overall tolerating her symptoms. We'll continue to monitor. 3.  Osteopenia. Patient's last DEXA February 2012 showed osteopenia with -2.3. She is on Fosamax. She started on Actonel in January 2010 and was switched to Fosamax due to her insurance issues. She had statistically significant improvement with her last DEXA. We discussed the possibility of drug-free holiday and we will  repeat DEXA now at a two-year interval. If she continues stable or improved then we'll stop her Fosamax for now and repeat her DEXA in another 2 years. Increase calcium and vitamin D recommendations reviewed. 4. Pap smear 2012. No Pap smear was done today. She has no history of significantly abnormal Pap smears noting an ASCUS Pap smear in 2008 with negative high-risk HPV and negative Pap smears since then. We'll plan on every 3 year Pap smears in accordance with current screening guidelines and she agrees with this.  5. Hypercholesterolemia/health maintenance. She had discussed her elevated LDL with her primary who had suggested treatment but she rejected this. I did not check her labs this year as this is being done at her primary and also Dr. Gustavo Lah office and she is to follow up with her primary reference to her elevated cholesterol and his recommendation to start medication.     Dara Lords MD, 4:03 PM 03/11/2012

## 2012-03-11 NOTE — Patient Instructions (Signed)
Follow up for DEXA as scheduled

## 2012-03-12 LAB — URINALYSIS W MICROSCOPIC + REFLEX CULTURE
Bilirubin Urine: NEGATIVE
Casts: NONE SEEN
Crystals: NONE SEEN
Glucose, UA: NEGATIVE mg/dL
Protein, ur: NEGATIVE mg/dL
Specific Gravity, Urine: 1.017 (ref 1.005–1.030)
Squamous Epithelial / LPF: NONE SEEN
pH: 7 (ref 5.0–8.0)

## 2012-03-24 ENCOUNTER — Telehealth: Payer: Self-pay | Admitting: Hematology & Oncology

## 2012-03-24 NOTE — Telephone Encounter (Signed)
Patient called and cx 03/25/12 and resch for 04/09/12 °

## 2012-03-25 ENCOUNTER — Other Ambulatory Visit: Payer: BC Managed Care – PPO | Admitting: Lab

## 2012-03-25 ENCOUNTER — Ambulatory Visit: Payer: BC Managed Care – PPO | Admitting: Hematology & Oncology

## 2012-04-09 ENCOUNTER — Ambulatory Visit (HOSPITAL_BASED_OUTPATIENT_CLINIC_OR_DEPARTMENT_OTHER): Payer: BC Managed Care – PPO | Admitting: Hematology & Oncology

## 2012-04-09 ENCOUNTER — Other Ambulatory Visit (HOSPITAL_BASED_OUTPATIENT_CLINIC_OR_DEPARTMENT_OTHER): Payer: BC Managed Care – PPO | Admitting: Lab

## 2012-04-09 VITALS — BP 108/58 | HR 65 | Temp 97.8°F | Resp 16 | Ht 64.0 in | Wt 126.0 lb

## 2012-04-09 DIAGNOSIS — Z853 Personal history of malignant neoplasm of breast: Secondary | ICD-10-CM

## 2012-04-09 LAB — CBC WITH DIFFERENTIAL (CANCER CENTER ONLY)
BASO#: 0 10e3/uL (ref 0.0–0.2)
BASO%: 0.3 % (ref 0.0–2.0)
EOS%: 2.2 % (ref 0.0–7.0)
Eosinophils Absolute: 0.1 10e3/uL (ref 0.0–0.5)
HCT: 38.7 % (ref 34.8–46.6)
HGB: 13.1 g/dL (ref 11.6–15.9)
LYMPH#: 3.2 10e3/uL (ref 0.9–3.3)
LYMPH%: 50.7 % — ABNORMAL HIGH (ref 14.0–48.0)
MCH: 30.6 pg (ref 26.0–34.0)
MCHC: 33.9 g/dL (ref 32.0–36.0)
MCV: 90 fL (ref 81–101)
MONO#: 0.5 10e3/uL (ref 0.1–0.9)
MONO%: 7.2 % (ref 0.0–13.0)
NEUT#: 2.5 10e3/uL (ref 1.5–6.5)
NEUT%: 39.6 % (ref 39.6–80.0)
Platelets: 206 10e3/uL (ref 145–400)
RBC: 4.28 10e6/uL (ref 3.70–5.32)
RDW: 12.4 % (ref 11.1–15.7)
WBC: 6.3 10e3/uL (ref 3.9–10.0)

## 2012-04-09 NOTE — Progress Notes (Signed)
This office note has been dictated.

## 2012-04-10 LAB — COMPREHENSIVE METABOLIC PANEL
AST: 33 U/L (ref 0–37)
Albumin: 4.2 g/dL (ref 3.5–5.2)
Alkaline Phosphatase: 63 U/L (ref 39–117)
Potassium: 4.1 mEq/L (ref 3.5–5.3)
Sodium: 142 mEq/L (ref 135–145)
Total Bilirubin: 0.4 mg/dL (ref 0.3–1.2)
Total Protein: 6.6 g/dL (ref 6.0–8.3)

## 2012-04-10 LAB — LIPID PANEL
HDL: 42 mg/dL (ref >39)
LDL Cholesterol: 148 mg/dL — ABNORMAL HIGH (ref 0–99)
Triglycerides: 138 mg/dL (ref <150)
VLDL: 28 mg/dL (ref 0–40)

## 2012-04-10 LAB — VITAMIN D 25 HYDROXY (VIT D DEFICIENCY, FRACTURES): Vit D, 25-Hydroxy: 46 ng/mL (ref 30–89)

## 2012-04-10 NOTE — Progress Notes (Signed)
CC:   Rachel Manges, MD Timothy P. Fontaine, M.D. Reather Littler, M.D.  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  CURRENT THERAPY:  Observation.  INTERIM HISTORY:  Rachel [name] comes in for her followup.  We see her every 6 months.  She is doing okay.  She did have a URI recently.  This was treated by Dr. Tanya Nones, who is her family doctor.  It did take a couple of rounds of antibiotics.  She still has a little bit of tenderness in the throat.  There is no odynophagia.  She has had no weight loss or weight gain.  There has been no change in bowel or bladder habits.  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  Her last mammogram was back in April 2013.  Everything looked fine on the mammogram.  She does have some cholesterol issues.  She does not want take cholesterol medication.  She is trying to do this "naturally."  PHYSICAL EXAM:  General:  This is a well-developed, well-nourished white female in no obvious distress.  Vital signs:  Temperature of 97.8, pulse 65, respiratory rate 16, blood pressure 108/56.  Weight is 126.  Head and neck:  Normocephalic, atraumatic skull.  There are no ocular or oral lesions.  There are no palpable cervical or supraclavicular lymph nodes. Lungs:  Clear bilaterally.  Cardiac:  Regular rate and rhythm with a normal S1 and S2.  There are no murmurs, rubs, or bruits.  Abdomen: Soft with good bowel sounds.  There is no palpable abdominal mass. There is no palpable hepatosplenomegaly.  Breasts:  Left breast with no masses, edema, or erythema.  There is no left axillary adenopathy. Right breast shows a well-healed lumpectomy at the 8 o'clock position. There is some slight contraction of the right breast.  There is no right axillary adenopathy.  Abdomen:  Soft with good bowel sounds.  There is no fluid wave.  No palpable abdominal mass is noted.  No palpable hepatosplenomegaly.  Back:  No kyphosis or  osteoporotic changes. Neurological:  No focal neurological deficits.  Skin:  No rashes, ecchymoses, or petechiae.  LABORATORY STUDIES:  White cell count is 6.3, hemoglobin 13.1, hematocrit 38.7, platelet count 206.  Cholesterol 218.  LDL is 148.  HDL is 42.  Cholesterol/HDL ratio is 5.2.  IMPRESSION:  Rachel [name] is very nice 47 year old white female.  She is now 13 years out from completion of her adjuvant chemotherapy.  She received CMF.  She was on 5 years of tamoxifen.  She did have some radiation therapy.  We will continue to see her back every 6 months.    ______________________________ Josph Macho, M.D. PRE/MEDQ  D:  04/10/2012  T:  04/10/2012  Job:  [id number]

## 2012-04-15 ENCOUNTER — Telehealth: Payer: Self-pay | Admitting: Hematology & Oncology

## 2012-04-15 NOTE — Telephone Encounter (Addendum)
Message copied by Cathi Roan on Thu Apr 15, 2012 12:53 PM ------      Message from: Josph Macho      Created: Sat Apr 10, 2012  8:39 AM       Call and let her know that her cholesterol is still high but a little better. Please fax the lab results including cholesterol to her family MD. thanks. Pete  04-14-12 Called patient and left message for her to call this office.   04-14-12 Spoke to patient regarding above MD note, also reviewed with her the "My Chart" system. Mailed patient labs reports and the instructions on how to register for my chart.  [name]  LPN ------

## 2012-05-26 ENCOUNTER — Ambulatory Visit (INDEPENDENT_AMBULATORY_CARE_PROVIDER_SITE_OTHER): Payer: BC Managed Care – PPO

## 2012-05-26 DIAGNOSIS — M8580 Other specified disorders of bone density and structure, unspecified site: Secondary | ICD-10-CM

## 2012-05-26 DIAGNOSIS — M899 Disorder of bone, unspecified: Secondary | ICD-10-CM

## 2012-05-27 ENCOUNTER — Encounter: Payer: Self-pay | Admitting: Gynecology

## 2012-08-24 ENCOUNTER — Other Ambulatory Visit (INDEPENDENT_AMBULATORY_CARE_PROVIDER_SITE_OTHER): Payer: BC Managed Care – PPO

## 2012-08-24 ENCOUNTER — Other Ambulatory Visit: Payer: Self-pay | Admitting: *Deleted

## 2012-08-24 DIAGNOSIS — E039 Hypothyroidism, unspecified: Secondary | ICD-10-CM

## 2012-08-27 ENCOUNTER — Encounter: Payer: Self-pay | Admitting: Endocrinology

## 2012-08-27 ENCOUNTER — Ambulatory Visit (INDEPENDENT_AMBULATORY_CARE_PROVIDER_SITE_OTHER): Payer: BC Managed Care – PPO | Admitting: Endocrinology

## 2012-08-27 VITALS — BP 104/60 | HR 73 | Temp 98.6°F | Resp 12 | Ht 65.0 in | Wt 125.9 lb

## 2012-08-27 DIAGNOSIS — E039 Hypothyroidism, unspecified: Secondary | ICD-10-CM

## 2012-08-27 NOTE — Progress Notes (Signed)
Patient ID: Rachel [name], female   DOB: 03-Jul-1965, 47 y.o.   MRN: [id number]  Reason for Appointment:  Hypothyroidism, followup visit    History of Present Illness:   The hypothyroidism was first diagnosed several years ago  Her thyroid dosage has been typically difficult to regulate and requires fairly frequent adjustments of dosage despite her being compliant with brand name Synthroid.  Complaints are reported by the patient now are fatigue for the last month,  can't seem to lose weight , cold sensitivity which is not new.  She was feeling fairly good on the last visit 6 weeks ago even though she had iatrogenic hyperthyroidism         The treatments that the patient has taken include Synthroid 100 mg recently and previously was on 125 mcg.                 Compliance with the medical regimen has been as prescribed with taking the tablet in the morning before breakfast.  Her TSH in 5/14 was 0.03 with a free T4 of 1.68, TSH in 11/13 TSH was 0.32 and previously was 0.22   Appointment on 08/24/2012  Component Date Value Range Status  . Free T4 08/24/2012 1.03  0.60 - 1.60 ng/dL Final  . TSH 40/98/1191 0.43  0.35 - 5.50 uIU/mL Final      Medication List       This list is accurate as of: 08/27/12 10:17 AM.  Always use your most recent med list.               flunisolide 25 MCG/ACT (0.025%) Soln  Commonly known as:  NASALIDE  USE AS DIRECTED     SYNTHROID PO  Take 125 mcg by mouth.     VITAMIN D PO  Take 2,000 Units by mouth.         Past Medical History  Diagnosis Date  . ASCUS (atypical squamous cells of undetermined significance) on Pap smear 02/2006, 05/2006    NEG HPV ----- PAP 12/2007 WNL  . Hypothyroidism   . Osteopenia 05/2012    T score -2.2 FRAX 6.5%/1.0%  . Hypercholesteremia   . 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 Breast CA 03/31/2011    Past Surgical History  Procedure Laterality Date  . Cesarean  section    . Pelvic laparoscopy    . Colposcopy    . Tonsillectomy    . Breast lumpectomy      Family History  Problem Relation Age of Onset  . Diabetes Father     Social History:  reports that she has never smoked. She does not have any smokeless tobacco history on file. She reports that she does not drink alcohol or use illicit drugs.  Allergies:  Allergies  Allergen Reactions  . Erythromycin    Review of systems:  She has a history of breast cancer Insomnia has been a problem with waking up early; no depressive symptoms    Examination:   LMP 04/24/2000   GENERAL APPEARANCE: Alert And looks well.    FACE: No puffiness of face or hands        NECK: no thyromegaly.          NEUROLOGIC EXAM: DTRs 2+ bilaterally at biceps.    Assessments    Hypothyroidism - 244.9   Her TSH is finally normal. This has been persistently low previously and she had significant over replacement with taking  125 mcg on her last visit 6 weeks ago She is probably having fatigue related to an encouraged sleeping at night and discussed that her thyroid level is excellent and will hopefully be more normal on the next visit  Insomnia: To discuss with PCP, also encouraged regular exercise   Treatment:   Continue same dosage before breakfast daily. Avoid taking any calcium or iron supplements with the thyroid supplement.    Ashayla Subia 08/27/2012, 10:17 AM

## 2012-08-27 NOTE — Patient Instructions (Addendum)
Continue same dosage before breakfast daily.  Avoid taking any calcium or iron supplements with the thyroid supplement.    Start regular exercise

## 2012-09-24 ENCOUNTER — Other Ambulatory Visit (HOSPITAL_BASED_OUTPATIENT_CLINIC_OR_DEPARTMENT_OTHER): Payer: BC Managed Care – PPO | Admitting: Lab

## 2012-09-24 ENCOUNTER — Ambulatory Visit (HOSPITAL_BASED_OUTPATIENT_CLINIC_OR_DEPARTMENT_OTHER): Payer: BC Managed Care – PPO | Admitting: Hematology & Oncology

## 2012-09-24 VITALS — BP 106/67 | HR 72 | Temp 97.9°F | Resp 20 | Ht 64.0 in | Wt 126.0 lb

## 2012-09-24 DIAGNOSIS — E785 Hyperlipidemia, unspecified: Secondary | ICD-10-CM

## 2012-09-24 DIAGNOSIS — E039 Hypothyroidism, unspecified: Secondary | ICD-10-CM

## 2012-09-24 DIAGNOSIS — C50911 Malignant neoplasm of unspecified site of right female breast: Secondary | ICD-10-CM

## 2012-09-24 DIAGNOSIS — Z853 Personal history of malignant neoplasm of breast: Secondary | ICD-10-CM

## 2012-09-24 DIAGNOSIS — M8580 Other specified disorders of bone density and structure, unspecified site: Secondary | ICD-10-CM

## 2012-09-24 LAB — TSH CHCC: TSH: 1.153 m(IU)/L (ref 0.308–3.960)

## 2012-09-24 LAB — CBC WITH DIFFERENTIAL (CANCER CENTER ONLY)
BASO#: 0.1 10*3/uL (ref 0.0–0.2)
Eosinophils Absolute: 0.4 10*3/uL (ref 0.0–0.5)
HGB: 14.3 g/dL (ref 11.6–15.9)
MCH: 31 pg (ref 26.0–34.0)
MCV: 91 fL (ref 81–101)
MONO#: 0.5 10*3/uL (ref 0.1–0.9)
NEUT#: 2.4 10*3/uL (ref 1.5–6.5)
Platelets: 226 10*3/uL (ref 145–400)
RBC: 4.61 10*6/uL (ref 3.70–5.32)
WBC: 5.9 10*3/uL (ref 3.9–10.0)

## 2012-09-24 NOTE — Progress Notes (Signed)
This office note has been dictated.

## 2012-09-25 LAB — COMPREHENSIVE METABOLIC PANEL
Albumin: 4.7 g/dL (ref 3.5–5.2)
BUN: 14 mg/dL (ref 6–23)
CO2: 27 mEq/L (ref 19–32)
Calcium: 9.7 mg/dL (ref 8.4–10.5)
Glucose, Bld: 81 mg/dL (ref 70–99)
Potassium: 4 mEq/L (ref 3.5–5.3)
Sodium: 142 mEq/L (ref 135–145)
Total Protein: 6.4 g/dL (ref 6.0–8.3)

## 2012-09-25 LAB — T4, FREE: Free T4: 1.22 ng/dL (ref 0.80–1.80)

## 2012-09-25 LAB — VITAMIN D 25 HYDROXY (VIT D DEFICIENCY, FRACTURES): Vit D, 25-Hydroxy: 42 ng/mL (ref 30–89)

## 2012-09-25 LAB — LIPID PANEL
Cholesterol: 247 mg/dL — ABNORMAL HIGH (ref 0–200)
Total CHOL/HDL Ratio: 5 Ratio

## 2012-09-25 NOTE — Progress Notes (Signed)
CC:   Rachel Manges, MD Timothy P. Fontaine, M.D. Rachel [name], M.D.  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  CURRENT THERAPY:  Observation.  INTERIM HISTORY:  Rachel [name] comes in for her followup.  As always, it is fun to see her and talk to her.  Her oldest son is engaged.  We certainly had a nice time talking about simultaneous engagements with her son and my daughter.  She is getting ready to start school again.  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  She is holding her weight.  She, unfortunately, has not been able to exercise like she would like.  She is watching what she eats.  She does have hypothyroidism and is on Synthroid.  Her last mammogram I think was in April.  Unfortunately, I do not have the results.  She has had no change in bowel or bladder habits.  There has been no cough.  There has been no nausea or vomiting.  There has been no leg swelling.  PHYSICAL EXAMINATION:  General:  This is a well-developed, well- nourished white female in no obvious distress.  Vital signs: Temperature of 97.9, pulse 72, respiratory rate 20, blood pressure 106/67.  Weight is 126.  Head and neck:  Normocephalic, atraumatic skull.  There are no ocular or oral lesions.  There are no palpable cervical or supraclavicular lymph nodes.  Lungs:  Clear bilaterally. Cardiac:  Regular rate and rhythm with a normal S1 and S2.  There are no murmurs, rubs or bruits.  Abdomen:  Soft.  She has good bowel sounds. There is no fluid wave.  There is no palpable hepatosplenomegaly. Breasts:  Left breast with no masses, edema or erythema.  There is no left axillary adenopathy.  Right breast shows well-healed lumpectomy at the 8 o'clock position.  There is some contraction of the right breast. No distinct mass is noted in the right breast.  There is no right axillary adenopathy.  Back:  No tenderness over the spine, ribs, or hips.   Extremities:  No clubbing, cyanosis or edema.  Skin:  No rashes, ecchymosis, or petechia.  LABORATORY STUDIES:  White cell count 5.9, hemoglobin 14.3, hematocrit 41.9, platelet count 226.  Cholesterol is 247 and LDL is 126.  Her cholesterol/HDL ratio is 5.  Her electrolytes all look okay.  IMPRESSION:  Rachel [name] is a very charming 47 year old white female with a history of stage I ductal carcinoma of the right breast.  She underwent lumpectomy.  She had adjuvant chemotherapy with [facility].  This was completed probably 14 years ago.  She had tamoxifen and radiation.  I really believe that she is cured.  I think her risk of recurrence is going to be less than 5%.  I think her biggest issue is going to be this cholesterol.  She sees Dr. Tanya Nones but does not like to be on any medicine for the cholesterol.  We will make sure that we fax Dr. Tanya Nones the cholesterol levels.  I will see her back in 6 months.    ______________________________ Josph Macho, M.D. PRE/MEDQ  D:  09/24/2012  T:  09/25/2012  Job:  [id number]

## 2012-09-27 ENCOUNTER — Telehealth: Payer: Self-pay | Admitting: *Deleted

## 2012-09-27 NOTE — Telephone Encounter (Addendum)
Message copied by Mirian Capuchin on Mon Sep 27, 2012  2:02 PM ------      Message from: Arlan Organ R      Created: Mon Sep 27, 2012  7:37 AM       Please call her and tell her that her cholesterol is 247. Please send labs to her family doctor. Thanks. Pete ------This message left on pt's home answering machine.  Labs faxed to pt's family MD>

## 2012-10-08 ENCOUNTER — Other Ambulatory Visit: Payer: BC Managed Care – PPO | Admitting: Lab

## 2012-10-08 ENCOUNTER — Ambulatory Visit: Payer: BC Managed Care – PPO | Admitting: Hematology & Oncology

## 2012-10-16 ENCOUNTER — Other Ambulatory Visit: Payer: Self-pay | Admitting: Hematology & Oncology

## 2012-10-20 ENCOUNTER — Encounter: Payer: Self-pay | Admitting: Family Medicine

## 2012-10-20 ENCOUNTER — Telehealth: Payer: Self-pay | Admitting: Family Medicine

## 2012-10-20 MED ORDER — ATORVASTATIN CALCIUM 20 MG PO TABS: 20.0000 mg | ORAL_TABLET | Freq: Every day | ORAL | Status: DC

## 2012-10-20 NOTE — Telephone Encounter (Signed)
We did receive lab results from Dr. Josph Macho that showed pt's cholesterol is elevated and per Dr. Tanya Nones pt needs to be on Lipitor 20mg  qhs. After several unsuccessful attempts to contact pt by phone a letter was sent to pt explaining her labs and that the medication was sent to pharmacy.

## 2012-12-07 ENCOUNTER — Encounter: Payer: Self-pay | Admitting: Family Medicine

## 2012-12-07 ENCOUNTER — Ambulatory Visit (INDEPENDENT_AMBULATORY_CARE_PROVIDER_SITE_OTHER): Payer: BC Managed Care – PPO | Admitting: Family Medicine

## 2012-12-07 VITALS — BP 114/72 | HR 60 | Temp 98.1°F | Resp 18 | Wt 130.0 lb

## 2012-12-07 DIAGNOSIS — G43901 Migraine, unspecified, not intractable, with status migrainosus: Secondary | ICD-10-CM

## 2012-12-07 MED ORDER — METHYLPREDNISOLONE (PAK) 4 MG PO TABS: ORAL_TABLET | ORAL | Status: DC

## 2012-12-07 NOTE — Progress Notes (Signed)
  Subjective:    Patient ID: Rachel [name], female    DOB: May 08, 1965, 47 y.o.   MRN: [id number]  HPI Patient has had almost a daily headache for the last 4 weeks. She is having a severe migraine at least one to per week. She's having a dull constant headache every day in between. She has photophobia and phonophobia. All headaches in on her left side. Her classic migraines. She tried Topamax in the past for migraine prevention but did not like the medication due to to the hypersomnolence that caused. She denies any sinus infection or fevers. She has been having more trouble with her allergies however. Past Medical History  Diagnosis Date  . ASCUS (atypical squamous cells of undetermined significance) on Pap smear 02/2006, 05/2006    NEG HPV ----- PAP 12/2007 WNL  . Hypothyroidism   . Osteopenia 05/2012    T score -2.2 FRAX 6.5%/1.0%  . Hypercholesteremia   . 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 Breast CA 03/31/2011   Current Outpatient Prescriptions on File Prior to Visit  Medication Sig Dispense Refill  . Cholecalciferol (VITAMIN D PO) Take 2,000 Units by mouth.      . flunisolide (NASALIDE) 25 MCG/ACT (0.025%) SOLN USE AS DIRECTED  2 Bottle  4  . Levothyroxine Sodium (SYNTHROID PO) Take 100 mcg by mouth.       . rizatriptan (MAXALT) 10 MG tablet       . atorvastatin (LIPITOR) 20 MG tablet Take 1 tablet (20 mg total) by mouth daily.  30 tablet  3   No current facility-administered medications on file prior to visit.   Allergies  Allergen Reactions  . Erythromycin    History   Social History  . Marital Status: Married    Spouse Name: N/A    Number of Children: N/A  . Years of Education: N/A   Occupational History  . Not on file.   Social History Main Topics  . Smoking status: Never Smoker   . Smokeless tobacco: Not on file  . Alcohol Use: No  . Drug Use: No  . Sexual Activity: Yes    Birth Control/ Protection: Post-menopausal    Other Topics Concern  . Not on file   Social History Narrative  . No narrative on file      Review of Systems  All other systems reviewed and are negative.       Objective:   Physical Exam  Vitals reviewed. Constitutional: She is oriented to person, place, and time. She appears well-developed and well-nourished.  HENT:  Right Ear: External ear normal.  Left Ear: External ear normal.  Nose: Nose normal.  Mouth/Throat: Oropharynx is clear and moist. No oropharyngeal exudate.  Cardiovascular: Normal rate, regular rhythm and normal heart sounds.   Pulmonary/Chest: Effort normal and breath sounds normal. No respiratory distress. She has no wheezes. She has no rales.  Neurological: She is alert and oriented to person, place, and time. She has normal reflexes. She displays normal reflexes. No cranial nerve deficit. She exhibits normal muscle tone. Coordination normal.          Assessment & Plan:  1. Migraine with status migrainosus I recommended a Medrol Dosepak.  If symptoms do not improve after Medrol Dosepak recommend beginning Inderal LA 80 mg by mouth daily as a preventative versus retrying topamax. - methylPREDNIsolone (MEDROL DOSPACK) 4 MG tablet; follow package directions  Dispense: 21 tablet; Refill: 0

## 2012-12-16 ENCOUNTER — Other Ambulatory Visit: Payer: Self-pay

## 2012-12-27 ENCOUNTER — Other Ambulatory Visit: Payer: Self-pay | Admitting: *Deleted

## 2012-12-27 ENCOUNTER — Other Ambulatory Visit (INDEPENDENT_AMBULATORY_CARE_PROVIDER_SITE_OTHER): Payer: BC Managed Care – PPO

## 2012-12-27 DIAGNOSIS — E039 Hypothyroidism, unspecified: Secondary | ICD-10-CM

## 2012-12-28 LAB — T4, FREE: Free T4: 1.01 ng/dL (ref 0.60–1.60)

## 2012-12-28 LAB — TSH: TSH: 0.21 u[IU]/mL — ABNORMAL LOW (ref 0.35–5.50)

## 2012-12-30 ENCOUNTER — Ambulatory Visit (INDEPENDENT_AMBULATORY_CARE_PROVIDER_SITE_OTHER): Payer: BC Managed Care – PPO | Admitting: Endocrinology

## 2012-12-30 ENCOUNTER — Encounter: Payer: Self-pay | Admitting: Endocrinology

## 2012-12-30 VITALS — BP 120/74 | HR 65 | Temp 97.9°F | Resp 10 | Ht 64.0 in | Wt 128.7 lb

## 2012-12-30 DIAGNOSIS — E039 Hypothyroidism, unspecified: Secondary | ICD-10-CM

## 2012-12-30 NOTE — Progress Notes (Signed)
Patient ID: Rachel [name], female   DOB: [birth year], 47 y.o.   MRN: [id number]  Reason for Appointment:  Hypothyroidism, followup visit   History of Present Illness:   The hypothyroidism was first diagnosed in 1992  With a TSH of 51  Her thyroid dosage has been typically difficult to regulate and requires fairly frequent adjustments of dosage despite her being compliant with brand name Synthroid. Over the years she has taken a wide range of doses for her supplement  Complaints are reported by the patient now are hair loss, dry skin,  can't seem to lose weight, cold sensitivity which is not new.  On her last visit her TSH was normal at 0.43 with a regimen of 100 mcg and this was continued Previously on 125 mcg she had significantly high free T4 and undetectable TSH       The treatments that the patient has taken include Synthroid 100 mg recently and previously was on 125 mcg.                 Compliance with the medical regimen has been as prescribed with taking the tablet in the morning before breakfast. ` Her TSH in 5/14 was 0.03 with a free T4 of 1.68, TSH in 11/13 TSH was 0.32 and previously was 0.22   Appointment on 12/27/2012  Component Date Value Range Status  . TSH 12/27/2012 0.21* 0.35 - 5.50 uIU/mL Final  . Free T4 12/27/2012 1.01  0.60 - 1.60 ng/dL Final      Medication List       This list is accurate as of: 12/30/12  3:42 PM.  Always use your most recent med list.               atorvastatin 20 MG tablet  Commonly known as:  LIPITOR  Take 1 tablet (20 mg total) by mouth daily.     flunisolide 25 MCG/ACT (0.025%) Soln  Commonly known as:  NASALIDE  USE AS DIRECTED     methylPREDNIsolone 4 MG tablet  Commonly known as:  MEDROL DOSPACK  follow package directions     rizatriptan 10 MG tablet  Commonly known as:  MAXALT     SYNTHROID PO  Take 100 mcg by mouth.     VITAMIN D PO  Take 2,000 Units by mouth.         Past Medical History  Diagnosis Date  .  ASCUS (atypical squamous cells of undetermined significance) on Pap smear 02/2006, 05/2006    NEG HPV ----- PAP 12/2007 WNL  . Hypothyroidism   . Osteopenia 05/2012    T score -2.2 FRAX 6.5%/1.0%  . Hypercholesteremia   . 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 Breast CA 03/31/2011    Past Surgical History  Procedure Laterality Date  . Cesarean section    . Pelvic laparoscopy    . Colposcopy    . Tonsillectomy    . Breast lumpectomy      Family History  Problem Relation Age of Onset  . Diabetes Father     Social History:  reports that she has never smoked. She does not have any smokeless tobacco history on file. She reports that she does not drink alcohol or use illicit drugs.  Allergies:  Allergies  Allergen Reactions  . Erythromycin    Review of systems:  She has a history of breast cancer History of osteopenia    Examination:  BP 120/74  Pulse 65  Temp(Src) 97.9 F (36.6 C)  Resp 10  Ht 5\' 4"  (1.626 m)  Wt 128 lb 11.2 oz (58.378 kg)  BMI 22.08 kg/m2  SpO2 99%  LMP 04/24/2000   GENERAL APPEARANCE:  looks well  No puffiness of face or hands. No obvious alopecia        NECK: no thyromegaly.          NEUROLOGIC EXAM:  biceps reflexes show a normal to slow relaxation    Assessments    Hypothyroidism   Her TSH which was normal about 4 months ago is now low at 0.2 despite continuing the same dose and no change in her weight  Discussed that her weight gain and related to her thyroid Also her hair loss may be related to a delayed effect of her change in thyroid level earlier this year    Treatment:  Since her TSH is only minimally reduced with stable T4 level will have her reduce her dose by half a tablet weekly only She will get her TSH checked in 6 weeks and adjust her dose further  Rachel [name] 12/30/2012, 3:42 PM

## 2012-12-30 NOTE — Patient Instructions (Signed)
Take 1/2 tab on Sundays and 1 on others

## 2013-02-08 ENCOUNTER — Other Ambulatory Visit: Payer: Self-pay | Admitting: Family Medicine

## 2013-02-08 MED ORDER — RIZATRIPTAN BENZOATE 10 MG PO TABS: 10.0000 mg | ORAL_TABLET | ORAL | Status: DC | PRN

## 2013-02-11 ENCOUNTER — Other Ambulatory Visit (INDEPENDENT_AMBULATORY_CARE_PROVIDER_SITE_OTHER): Payer: BC Managed Care – PPO

## 2013-02-11 DIAGNOSIS — E039 Hypothyroidism, unspecified: Secondary | ICD-10-CM

## 2013-02-11 LAB — TSH: TSH: 1.02 u[IU]/mL (ref 0.35–5.50)

## 2013-02-16 ENCOUNTER — Telehealth: Payer: Self-pay | Admitting: *Deleted

## 2013-02-16 DIAGNOSIS — G43901 Migraine, unspecified, not intractable, with status migrainosus: Secondary | ICD-10-CM

## 2013-02-16 NOTE — Telephone Encounter (Signed)
Pt states that her migraines are back and wants to know if you can prescribe her the Medrol dosepak again.States it works to take the edge off when the Migraine pills dont wrk.

## 2013-02-16 NOTE — Telephone Encounter (Signed)
?   OK to Refill  

## 2013-02-17 MED ORDER — METHYLPREDNISOLONE (PAK) 4 MG PO TABS: ORAL_TABLET | ORAL | Status: DC

## 2013-02-17 NOTE — Telephone Encounter (Signed)
Ok with medrol dose pack.

## 2013-02-17 NOTE — Telephone Encounter (Signed)
Med sent - pt aware per vm

## 2013-03-04 ENCOUNTER — Other Ambulatory Visit: Payer: Self-pay | Admitting: *Deleted

## 2013-03-04 MED ORDER — LEVOTHYROXINE SODIUM 100 MCG PO TABS: 100.0000 ug | ORAL_TABLET | Freq: Every day | ORAL | Status: DC

## 2013-03-17 ENCOUNTER — Other Ambulatory Visit (HOSPITAL_COMMUNITY)
Admission: RE | Admit: 2013-03-17 | Discharge: 2013-03-17 | Disposition: A | Discharge status: Home / Self Care | Payer: BC Managed Care – PPO | Source: Ambulatory Visit | Attending: Gynecology | Admitting: Gynecology

## 2013-03-17 ENCOUNTER — Encounter: Payer: Self-pay | Admitting: Gynecology

## 2013-03-17 ENCOUNTER — Ambulatory Visit (INDEPENDENT_AMBULATORY_CARE_PROVIDER_SITE_OTHER): Payer: BC Managed Care – PPO | Admitting: Gynecology

## 2013-03-17 VITALS — BP 112/66 | Ht 64.0 in | Wt 132.0 lb

## 2013-03-17 DIAGNOSIS — Z01419 Encounter for gynecological examination (general) (routine) without abnormal findings: Secondary | ICD-10-CM

## 2013-03-17 DIAGNOSIS — Z1151 Encounter for screening for human papillomavirus (HPV): Secondary | ICD-10-CM | POA: Insufficient documentation

## 2013-03-17 DIAGNOSIS — M899 Disorder of bone, unspecified: Secondary | ICD-10-CM

## 2013-03-17 DIAGNOSIS — N912 Amenorrhea, unspecified: Secondary | ICD-10-CM

## 2013-03-17 DIAGNOSIS — M8580 Other specified disorders of bone density and structure, unspecified site: Secondary | ICD-10-CM

## 2013-03-17 DIAGNOSIS — M949 Disorder of cartilage, unspecified: Secondary | ICD-10-CM

## 2013-03-17 NOTE — Patient Instructions (Signed)
Schedule your mammogram  Followup for lab results. Otherwise followup in one year for annual exam.

## 2013-03-17 NOTE — Progress Notes (Signed)
Rachel [name] [place] 05-30-1965 [id number]        48 y.o.  [id number] for annual exam.  Several issues noted below.  Past medical history,surgical history, problem list, medications, allergies, family history and social history were all reviewed and documented in the EPIC chart.  ROS:  Performed and pertinent positives and negatives are included in the history, assessment and plan .  Exam: Kim assistant Filed Vitals:   03/17/13 1534  BP: 112/66  Height: 5\' 4"  (1.626 m)  Weight: 132 lb (59.875 kg)   General appearance  Normal Skin grossly normal Head/Neck normal with no cervical or supraclavicular adenopathy thyroid normal Lungs  clear Cardiac RR, without RMG Abdominal  soft, nontender, without masses, organomegaly or hernia Breasts  examined lying and sitting without masses, retractions, discharge or axillary adenopathy. Right breast with well-healed lumpectomy scar  Pelvic  Ext/BUS/vagina  Normal with mild atrophic changes  Cervix  Normal with mild atrophic changes  Uterus  anteverted, normal size, shape and contour, midline and mobile nontender   Adnexa  Without masses or tenderness    Anus and perineum  Normal   Rectovaginal  Normal sphincter tone without palpated masses or tenderness.    Assessment/Plan:  48 y.o. [id number] female for annual exam.   1. Menopausal symptoms. Patient continues without menses status post breast cancer treatment. Is having hot flashes and night sweats. We've discussed on previous visits options and I again rediscuss options today to include Effexor trial patient is not interested but would prefer just to monitor at present. Has done no bleeding. We'll check baseline FSH to make sure we remain within the menopausal level. Call if any vaginal bleeding or she changes her mind about Effexor. 2. Osteopenia. DEXA 05/2012 with T score -2.2 FRAX 6.5%/1.0% recognizing patient had been on bisphosphonates. Patient had been on Actonel January 2010 and ultimately switched to  Fosamax. She discontinued last year at 4 year duration for a drug-free holiday. Recent vitamin D level normal at 42. Will plan repeat DEXA next year a two-year interval. She has had multiple TSHs, comprehensive metabolic panels vitamin Ds checked in the past. I cannot see where a PTH was done for completeness given her young age and osteopenia which we have attributed to her hypoestrogenic state. I do not have access to her older paper record. I therefore ordered a PTH for completeness. 3. Pap smear 2012. Pap/HPV today. No history of abnormal Pap smears previously. 4. Mammography 2013. History of stage I infiltrating ductal carcinoma the right breast 2001. Understands she is way overdue for her mammogram and she agrees to schedule this. SBE monthly reviewed. Still actively followed by her oncologist. 5. Hypothyroidism/hypercholesterolemia. Patient being actively managed by her primary physician. 6. Health maintenance. Patient's recently had baseline labs to include CBC metabolic panel lipid profile vitamin D. Will check urinalysis PTH and FSH. Followup 1 year, sooner she elects to try Effexor.   Note: This document was prepared with digital dictation and possible smart phrase technology. Any transcriptional errors that result from this process are unintentional.   [name] MD, 4:17 PM 03/17/2013

## 2013-03-17 NOTE — Addendum Note (Signed)
Addended by: Nelva Nay on: 03/17/2013 04:28 PM   Modules accepted: Orders

## 2013-03-18 ENCOUNTER — Encounter: Payer: Self-pay | Admitting: Gynecology

## 2013-03-18 LAB — URINALYSIS W MICROSCOPIC + REFLEX CULTURE
Bacteria, UA: NONE SEEN
Bilirubin Urine: NEGATIVE
CRYSTALS: NONE SEEN
Casts: NONE SEEN
GLUCOSE, UA: NEGATIVE mg/dL
HGB URINE DIPSTICK: NEGATIVE
Ketones, ur: NEGATIVE mg/dL
LEUKOCYTES UA: NEGATIVE
NITRITE: NEGATIVE
Protein, ur: NEGATIVE mg/dL
SPECIFIC GRAVITY, URINE: 1.022 (ref 1.005–1.030)
SQUAMOUS EPITHELIAL / LPF: NONE SEEN
Urobilinogen, UA: 0.2 mg/dL (ref 0.0–1.0)
pH: 6.5 (ref 5.0–8.0)

## 2013-03-18 LAB — FOLLICLE STIMULATING HORMONE: FSH: 104.4 m[IU]/mL

## 2013-03-19 ENCOUNTER — Encounter: Payer: Self-pay | Admitting: Family Medicine

## 2013-03-21 LAB — PTH, INTACT AND CALCIUM
Calcium: 9.3 mg/dL (ref 8.4–10.5)
PTH: 34.9 pg/mL (ref 14.0–72.0)

## 2013-03-22 ENCOUNTER — Ambulatory Visit (INDEPENDENT_AMBULATORY_CARE_PROVIDER_SITE_OTHER): Payer: BC Managed Care – PPO | Admitting: Family Medicine

## 2013-03-22 ENCOUNTER — Encounter: Payer: Self-pay | Admitting: Family Medicine

## 2013-03-22 VITALS — BP 110/70 | HR 96 | Temp 101.4°F | Resp 18 | Ht 64.0 in | Wt 136.0 lb

## 2013-03-22 DIAGNOSIS — R509 Fever, unspecified: Secondary | ICD-10-CM

## 2013-03-22 DIAGNOSIS — J111 Influenza due to unidentified influenza virus with other respiratory manifestations: Secondary | ICD-10-CM

## 2013-03-22 LAB — INFLUENZA A AND B
INFLUENZA A AG: NEGATIVE
INFLUENZA B AG: NEGATIVE

## 2013-03-22 MED ORDER — OSELTAMIVIR PHOSPHATE 75 MG PO CAPS: 75.0000 mg | ORAL_CAPSULE | Freq: Two times a day (BID) | ORAL | Status: DC

## 2013-03-22 NOTE — Progress Notes (Signed)
Subjective:    Patient ID: Rachel [name], female    DOB: March 27, 1965, 48 y.o.   MRN: [id number]  HPI Patient had the sudden onset of fever to 101, chills, myalgias, malaise beginning in 48 hours ago. It is associated with rhinorrhea, coughing, sore throat, and head congestion. Today she reports that she aches all over her body. She denies any chest pain, shortness of breath, dyspnea on exertion. She denies any otalgia. She denies any nausea vomiting or diarrhea. Past Medical History  Diagnosis Date  . ASCUS (atypical squamous cells of undetermined significance) on Pap smear 02/2006, 05/2006    NEG HPV ----- PAP 12/2007 WNL  . Hypothyroidism   . Osteopenia 05/2012    T score -2.2 FRAX 6.5%/1.0%  . Hypercholesteremia   . 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 Cholecalciferol (VITAMIN D PO) Take 2,000 Units by mouth.      . flunisolide (NASALIDE) 25 MCG/ACT (0.025%) SOLN USE AS DIRECTED  2 Bottle  4  . levothyroxine (SYNTHROID) 100 MCG tablet Take 1 tablet (100 mcg total) by mouth daily before breakfast.  30 tablet  5  . rizatriptan (MAXALT) 10 MG tablet Take 1 tablet (10 mg total) by mouth as needed for migraine.  6 tablet  5   No current facility-administered medications on file prior to visit.   Allergies  Allergen Reactions  . Erythromycin Nausea And Vomiting   History   Social History  . Marital Status: Married    Spouse Name: N/A    Number of Children: N/A  . Years of Education: N/A   Occupational History  . Not on file.   Social History Main Topics  . Smoking status: Never Smoker   . Smokeless tobacco: Not on file  . Alcohol Use: No  . Drug Use: No  . Sexual Activity: Yes    Birth Control/ Protection: Post-menopausal   Other Topics Concern  . Not on file   Social History Narrative  . No narrative on file      Review of  Systems  All other systems reviewed and are negative.       Objective:   Physical Exam  Vitals reviewed. Constitutional: She appears well-developed and well-nourished.  HENT:  Right Ear: Tympanic membrane, external ear and ear canal normal.  Left Ear: Tympanic membrane, external ear and ear canal normal.  Nose: Mucosal edema and rhinorrhea present. Right sinus exhibits no maxillary sinus tenderness and no frontal sinus tenderness. Left sinus exhibits no maxillary sinus tenderness and no frontal sinus tenderness.  Mouth/Throat: Oropharynx is clear and moist. No oropharyngeal exudate.  Eyes: Conjunctivae are normal. No scleral icterus.  Neck: Neck supple.  Cardiovascular: Normal rate, regular rhythm and normal heart sounds.   Pulmonary/Chest: Effort normal and breath sounds normal. No respiratory distress. She has no wheezes. She has no rales.  Abdominal: Soft. Bowel sounds are normal. She exhibits no distension. There is no tenderness. There is no rebound.  Lymphadenopathy:    She has no cervical adenopathy.          Assessment & Plan:  1. Fever, unspecified  - Influenza a and b  2. Influenza with other respiratory manifestations Clinically the patient has influenza. I recommended Tamiflu 75 mg by mouth twice a day for 5 days. I recommended symptomatic therapy with Tylenol and ibuprofen as needed for  fever and body aches, Mucinex DM as needed for cough, Sudafed as needed for congestion. I warned the patient about secondary pneumonia. Particular for this patient she is prone to sinusitis. She will call me if symptoms of pneumonia or sinusitis develop. - oseltamivir (TAMIFLU) 75 MG capsule; Take 1 capsule (75 mg total) by mouth 2 (two) times daily.  Dispense: 10 capsule; Refill: 0

## 2013-03-28 ENCOUNTER — Telehealth: Payer: Self-pay | Admitting: Family Medicine

## 2013-03-28 ENCOUNTER — Encounter: Payer: Self-pay | Admitting: Family Medicine

## 2013-03-28 MED ORDER — CEFDINIR 300 MG PO CAPS: 300.0000 mg | ORAL_CAPSULE | Freq: Two times a day (BID) | ORAL | Status: DC

## 2013-03-28 MED ORDER — PREDNISONE 20 MG PO TABS: ORAL_TABLET | ORAL | Status: DC

## 2013-03-28 NOTE — Telephone Encounter (Signed)
Med called out and pt aware 

## 2013-03-28 NOTE — Telephone Encounter (Signed)
[phone] Pharmacy is [facility] Pt is needing something called in for her sinus infection and she states that Dr Dennard Schaumann told her last week to call back if she started to get this and he would call her in something

## 2013-03-28 NOTE — Telephone Encounter (Signed)
Call back number is [phone] Pharmacy is [facility]  Pt states she was told by Dr Dennard Schaumann last week if she started to get a sinus infection to call back and he would get her something called in, so she is wondering if something could get called in

## 2013-03-28 NOTE — Telephone Encounter (Signed)
Med sent to pharm and Patient aware via mychart

## 2013-03-31 ENCOUNTER — Encounter: Payer: Self-pay | Admitting: Hematology & Oncology

## 2013-03-31 ENCOUNTER — Other Ambulatory Visit (HOSPITAL_BASED_OUTPATIENT_CLINIC_OR_DEPARTMENT_OTHER): Payer: BC Managed Care – PPO | Admitting: Lab

## 2013-03-31 ENCOUNTER — Ambulatory Visit (HOSPITAL_BASED_OUTPATIENT_CLINIC_OR_DEPARTMENT_OTHER): Payer: BC Managed Care – PPO | Admitting: Hematology & Oncology

## 2013-03-31 VITALS — BP 106/58 | HR 61 | Temp 98.6°F | Resp 14 | Ht 65.0 in | Wt 136.0 lb

## 2013-03-31 DIAGNOSIS — E785 Hyperlipidemia, unspecified: Secondary | ICD-10-CM

## 2013-03-31 DIAGNOSIS — Z853 Personal history of malignant neoplasm of breast: Secondary | ICD-10-CM

## 2013-03-31 DIAGNOSIS — R945 Abnormal results of liver function studies: Secondary | ICD-10-CM

## 2013-03-31 DIAGNOSIS — C50919 Malignant neoplasm of unspecified site of unspecified female breast: Secondary | ICD-10-CM

## 2013-03-31 DIAGNOSIS — E7800 Pure hypercholesterolemia, unspecified: Secondary | ICD-10-CM

## 2013-03-31 DIAGNOSIS — C50911 Malignant neoplasm of unspecified site of right female breast: Secondary | ICD-10-CM

## 2013-03-31 DIAGNOSIS — M8580 Other specified disorders of bone density and structure, unspecified site: Secondary | ICD-10-CM

## 2013-03-31 LAB — COMPREHENSIVE METABOLIC PANEL
ALT: 213 U/L — AB (ref 0–35)
AST: 88 U/L — ABNORMAL HIGH (ref 0–37)
Albumin: 4 g/dL (ref 3.5–5.2)
Alkaline Phosphatase: 122 U/L — ABNORMAL HIGH (ref 39–117)
BILIRUBIN TOTAL: 0.2 mg/dL (ref 0.2–1.2)
BUN: 21 mg/dL (ref 6–23)
CO2: 30 mEq/L (ref 19–32)
CREATININE: 0.63 mg/dL (ref 0.50–1.10)
Calcium: 9 mg/dL (ref 8.4–10.5)
Chloride: 101 mEq/L (ref 96–112)
GLUCOSE: 186 mg/dL — AB (ref 70–99)
Potassium: 4 mEq/L (ref 3.5–5.3)
Sodium: 139 mEq/L (ref 135–145)
Total Protein: 6.5 g/dL (ref 6.0–8.3)

## 2013-03-31 LAB — CBC WITH DIFFERENTIAL (CANCER CENTER ONLY)
BASO#: 0 10*3/uL (ref 0.0–0.2)
BASO%: 0.1 % (ref 0.0–2.0)
EOS%: 0 % (ref 0.0–7.0)
Eosinophils Absolute: 0 10*3/uL (ref 0.0–0.5)
HCT: 36.3 % (ref 34.8–46.6)
HEMOGLOBIN: 11.7 g/dL (ref 11.6–15.9)
LYMPH#: 1.3 10*3/uL (ref 0.9–3.3)
LYMPH%: 9.8 % — ABNORMAL LOW (ref 14.0–48.0)
MCH: 30.5 pg (ref 26.0–34.0)
MCHC: 32.2 g/dL (ref 32.0–36.0)
MCV: 95 fL (ref 81–101)
MONO#: 0.2 10*3/uL (ref 0.1–0.9)
MONO%: 1.5 % (ref 0.0–13.0)
NEUT#: 11.7 10*3/uL — ABNORMAL HIGH (ref 1.5–6.5)
NEUT%: 88.6 % — ABNORMAL HIGH (ref 39.6–80.0)
Platelets: 389 10*3/uL (ref 145–400)
RBC: 3.84 10*6/uL (ref 3.70–5.32)
RDW: 12.8 % (ref 11.1–15.7)
WBC: 13.2 10*3/uL — AB (ref 3.9–10.0)

## 2013-03-31 LAB — LIPID PANEL
CHOL/HDL RATIO: 4.6 ratio
Cholesterol: 226 mg/dL — ABNORMAL HIGH (ref 0–200)
HDL: 49 mg/dL (ref >39)
LDL CALC: 135 mg/dL — AB (ref 0–99)
Triglycerides: 208 mg/dL — ABNORMAL HIGH (ref <150)
VLDL: 42 mg/dL — ABNORMAL HIGH (ref 0–40)

## 2013-03-31 NOTE — Progress Notes (Signed)
Hematology and Oncology Follow Up Visit  Rachel [name] [id number] Feb 18, 1965 48 y.o. 03/31/2013   Principle Diagnosis:  Stage I (T1 N0M0) infiltrating ductal carcinoma the right breast Current Therapy:    observation     Interim History:  [name] is in for followup. We see her in 6 months. She's been 13 years out from surgery. She's doing okay. She does have a residuals of the flu. She has a bronchitis. She is on oral antibiotics and steroids. She says this is getting better. She had the flu a couple weeks ago. It now has caused her to have the bronchitis.  Outside of this, there's been no other issues. She is still working. She's a Pharmacist, hospital. Her oldest son is getting married in June. We talked about this. Her daughter is getting married in September.  She's had no problems with bowels or bladder. She's had no rashes. There's been no leg swelling. She's watching her cholesterol. She's not taking any cholesterol medicine.  She's due for a mammogram in April. Medications: Current outpatient prescriptions:cefdinir (OMNICEF) 300 MG capsule, Take 300 mg by mouth 2 (two) times daily., Disp: , Rfl: ;  Cholecalciferol (VITAMIN D PO), Take 2,000 Units by mouth., Disp: , Rfl: ;  flunisolide (NASALIDE) 25 MCG/ACT (0.025%) SOLN, USE AS DIRECTED, Disp: 2 Bottle, Rfl: 4;  levothyroxine (SYNTHROID) 100 MCG tablet, Take 1 tablet (100 mcg total) by mouth daily before breakfast., Disp: 30 tablet, Rfl: 5 methylPREDNIsolone (MEDROL DOSPACK) 4 MG tablet, Take 4 mg by mouth every morning. , Disp: , Rfl: ;  predniSONE (DELTASONE) 20 MG tablet, 3 tabs po qd x 2 days, 2 tabs po qd x 2 days, 1 tab po qd x 2 days, Disp: 12 tablet, Rfl: 0;  rizatriptan (MAXALT) 10 MG tablet, Take 1 tablet (10 mg total) by mouth as needed for migraine., Disp: 6 tablet, Rfl: 5  Allergies:  Allergies  Allergen Reactions  . Erythromycin Nausea And Vomiting    Past Medical History, Surgical history, Social history, and Family History were  reviewed and updated.  Review of Systems: As above  Physical Exam:  height is 5\' 5"  (1.651 m) and weight is 136 lb (61.689 kg). Her oral temperature is 98.6 F (37 C). Her blood pressure is 106/58 and her pulse is 61. Her respiration is 14.   Well-developed well-nourished white female in no obvious distress. Head and neck exam shows no ocular or oral lesions. There no palpable cervical or supraclavicular lymph nodes. Lungs are clear and bilaterally. There is some slight crackles. She has no wheezes. Cardiac exam regular in rhythm with no murmurs rubs or bruits. Abdomen is soft. Has good bowel sounds. There is no fluid wave. There is no liver or spleen.  Was exam shows left breast with no masses edema or erythema. There is no left axillary adenopathy. Right breast has well-healed lumpectomy at a clock position. There is no right breast masses. Right breast is slightly contracted from surgery and radiation. There is no right axillary adenopathy. Extremities shows no clubbing cyanosis or edema. No lymphedemas noted. Skin exam no rashes.  Lab Results  Component Value Date   WBC 13.2* 03/31/2013   HGB 11.7 03/31/2013   HCT 36.3 03/31/2013   MCV 95 03/31/2013   PLT 389 03/31/2013     Chemistry      Component Value Date/Time   NA 142 09/24/2012 0917   K 4.0 09/24/2012 0917   CL 105 09/24/2012 0917   CO2 27  09/24/2012 0917   BUN 14 09/24/2012 0917   CREATININE 0.77 09/24/2012 0917      Component Value Date/Time   CALCIUM 9.3 03/17/2013 1618   ALKPHOS 73 09/24/2012 0917   AST 24 09/24/2012 0917   ALT 36* 09/24/2012 0917   BILITOT 0.2* 09/24/2012 0917         Impression and Plan: Rachel [name] is a 48 year old white female. She had breast cancer 13 years ago. We did treat her with chemotherapy. Her tumor was ER positive.  I really don't think that her tumor will come back. I think the risk is less than 5% now.  We'll plan to see her back in 6 months.   Volanda Napoleon, MD 2/19/20155:41 PM

## 2013-04-06 ENCOUNTER — Telehealth: Payer: Self-pay | Admitting: *Deleted

## 2013-04-06 NOTE — Telephone Encounter (Signed)
Message copied by Rico Ala on Wed Apr 06, 2013  8:57 AM ------      Message from: Burney Gauze R      Created: Mon Apr 04, 2013  6:31 PM       I call her at a message about her liver tests. In the message I said I would order an ultrasound of her liver to see what is going on. I don't know this might be a fatty liver from her elevated cholesterol. I don't think it's a medicine that she is on. I don't think she's taking any supplements.            I told her to call me back tomorrow if she has any questions. ------

## 2013-04-06 NOTE — Telephone Encounter (Signed)
Per Dr. Marin Olp. I call her at a message about her liver tests. In the message I said I would order an ultrasound of her liver to see what is going on. I don't know this might be a fatty liver from her elevated cholesterol. I don't think it's a medicine that she is on. I don't think she's taking any supplements. I told her to call me back tomorrow if she has any questions.

## 2013-04-07 NOTE — Addendum Note (Signed)
Addended by: Burney Gauze R on: 04/07/2013 02:55 PM   Modules accepted: Orders

## 2013-04-08 ENCOUNTER — Telehealth: Payer: Self-pay | Admitting: Hematology & Oncology

## 2013-04-08 NOTE — Telephone Encounter (Signed)
Pt aware of 3-19 lab at [facility] she wanted 8 am

## 2013-04-11 ENCOUNTER — Other Ambulatory Visit: Payer: Self-pay | Admitting: *Deleted

## 2013-04-11 DIAGNOSIS — E039 Hypothyroidism, unspecified: Secondary | ICD-10-CM

## 2013-04-25 ENCOUNTER — Other Ambulatory Visit: Payer: BC Managed Care – PPO

## 2013-04-28 ENCOUNTER — Ambulatory Visit: Payer: BC Managed Care – PPO | Admitting: Endocrinology

## 2013-04-28 ENCOUNTER — Ambulatory Visit (INDEPENDENT_AMBULATORY_CARE_PROVIDER_SITE_OTHER): Payer: BC Managed Care – PPO | Admitting: Family Medicine

## 2013-04-28 ENCOUNTER — Other Ambulatory Visit: Payer: BC Managed Care – PPO

## 2013-04-28 ENCOUNTER — Encounter: Payer: Self-pay | Admitting: Family Medicine

## 2013-04-28 VITALS — BP 100/68 | HR 66 | Temp 96.8°F | Resp 18 | Ht 64.0 in | Wt 131.0 lb

## 2013-04-28 DIAGNOSIS — G43909 Migraine, unspecified, not intractable, without status migrainosus: Secondary | ICD-10-CM

## 2013-04-28 MED ORDER — KETOROLAC TROMETHAMINE 60 MG/2ML IM SOLN
60.0000 mg | Freq: Once | INTRAMUSCULAR | Status: AC
  Administered 2013-04-28: 60 mg via INTRAMUSCULAR

## 2013-04-28 MED ORDER — OXYCODONE-ACETAMINOPHEN 10-325 MG PO TABS: 1.0000 | ORAL_TABLET | Freq: Four times a day (QID) | ORAL | Status: DC | PRN

## 2013-04-28 MED ORDER — PROMETHAZINE HCL 25 MG/ML IJ SOLN
25.0000 mg | Freq: Once | INTRAMUSCULAR | Status: AC
  Administered 2013-04-28: 25 mg via INTRAMUSCULAR

## 2013-04-28 MED ORDER — PROMETHAZINE HCL 25 MG PO TABS: 25.0000 mg | ORAL_TABLET | Freq: Three times a day (TID) | ORAL | Status: DC | PRN

## 2013-04-28 NOTE — Progress Notes (Signed)
Subjective:    Patient ID: Rachel [name], female    DOB: [birth year], 48 y.o.   MRN: [id number]  HPI Patient is a very pleasant 48 year old white female who presents today with a sudden onset of a headache. She has a history of migraines. She states that this began at 3 AM. He describes it as her entire head is hurting. She denies any vision changes. She denies any hearing changes. She denies any neurologic deficits. She reports photophobia and phonophobia and nausea and vomiting. She denies any head injury. She tried Maxalt without any relief. Past Medical History  Diagnosis Date  . ASCUS (atypical squamous cells of undetermined significance) on Pap smear 02/2006, 05/2006    NEG HPV ----- PAP 12/2007 WNL  . Hypothyroidism   . Osteopenia 05/2012    T score -2.2 FRAX 6.5%/1.0%  . Hypercholesteremia   . 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 Cholecalciferol (VITAMIN D PO) Take 2,000 Units by mouth.      . flunisolide (NASALIDE) 25 MCG/ACT (0.025%) SOLN USE AS DIRECTED  2 Bottle  4  . levothyroxine (SYNTHROID) 100 MCG tablet Take 1 tablet (100 mcg total) by mouth daily before breakfast.  30 tablet  5  . predniSONE (DELTASONE) 20 MG tablet 3 tabs po qd x 2 days, 2 tabs po qd x 2 days, 1 tab po qd x 2 days  12 tablet  0  . rizatriptan (MAXALT) 10 MG tablet Take 1 tablet (10 mg total) by mouth as needed for migraine.  6 tablet  5   No current facility-administered medications on file prior to visit.   Allergies  Allergen Reactions  . Erythromycin Nausea And Vomiting   History   Social History  . Marital Status: Married    Spouse Name: N/A    Number of Children: N/A  . Years of Education: N/A   Occupational History  . Not on file.   Social History Main Topics  . Smoking status: Never Smoker   . Smokeless tobacco: Never Used     Comment: never used  product  . Alcohol Use: No  . Drug Use: No  . Sexual Activity: Yes    Birth Control/ Protection: Post-menopausal   Other Topics Concern  . Not on file   Social History Narrative  . No narrative on file      Review of Systems  All other systems reviewed and are negative.       Objective:   Physical Exam  Vitals reviewed. Constitutional: She is oriented to person, place, and time. She appears well-developed and well-nourished. No distress.  HENT:  Head: Normocephalic and atraumatic.  Eyes: Conjunctivae and EOM are normal. Pupils are equal, round, and reactive to light.  Cardiovascular: Normal rate and regular rhythm.   Pulmonary/Chest: Effort normal and breath sounds normal.  Neurological: She is alert and oriented to person, place, and time. She has normal reflexes. She displays normal reflexes. No cranial nerve deficit. She exhibits normal muscle tone. Coordination normal.  Skin: She is not diaphoretic.   patient is lying in a dark room on exam table.        Assessment & Plan:  1. Migraine, unspecified, without mention of intractable migraine without mention of status migrainosus Give the patient Toradol 60 mg IM times one and Phenergan 25 mg IM x1. On recheck  the patient later this morning. - ketorolac (TORADOL) injection 60 mg; Inject 2 mLs (60 mg total) into the muscle once. - promethazine (PHENERGAN) injection 25 mg; Inject 1 mL (25 mg total) into the muscle once. By 9:30 the patient's headache has started to improve. I will send her home on Phenergan 25 mg every 4 hours as needed for nausea or headache Percocet 10/325 one half to one tablet every 6 hours as needed for pain. Also recommend she start taking ibuprofen 800 mg every 8 hours.

## 2013-05-02 ENCOUNTER — Telehealth: Payer: Self-pay | Admitting: Endocrinology

## 2013-05-02 ENCOUNTER — Encounter: Payer: Self-pay | Admitting: Nurse Practitioner

## 2013-05-02 ENCOUNTER — Other Ambulatory Visit (HOSPITAL_BASED_OUTPATIENT_CLINIC_OR_DEPARTMENT_OTHER): Payer: BC Managed Care – PPO

## 2013-05-02 ENCOUNTER — Other Ambulatory Visit: Payer: Self-pay | Admitting: Nurse Practitioner

## 2013-05-02 DIAGNOSIS — R945 Abnormal results of liver function studies: Secondary | ICD-10-CM

## 2013-05-02 DIAGNOSIS — E7800 Pure hypercholesterolemia, unspecified: Secondary | ICD-10-CM

## 2013-05-02 DIAGNOSIS — Z853 Personal history of malignant neoplasm of breast: Secondary | ICD-10-CM

## 2013-05-02 DIAGNOSIS — C50919 Malignant neoplasm of unspecified site of unspecified female breast: Secondary | ICD-10-CM

## 2013-05-02 LAB — LIPID PANEL
CHOL/HDL RATIO: 4.5 ratio
Cholesterol: 229 mg/dL — ABNORMAL HIGH (ref 0–200)
HDL: 51 mg/dL (ref >39)
LDL Cholesterol: 150 mg/dL — ABNORMAL HIGH (ref 0–99)
Triglycerides: 138 mg/dL (ref <150)
VLDL: 28 mg/dL (ref 0–40)

## 2013-05-02 LAB — CBC WITH DIFFERENTIAL/PLATELET
BASO%: 0.8 % (ref 0.0–2.0)
BASOS ABS: 0 10*3/uL (ref 0.0–0.1)
EOS ABS: 0.1 10*3/uL (ref 0.0–0.5)
EOS%: 2.6 % (ref 0.0–7.0)
HEMATOCRIT: 41.3 % (ref 34.8–46.6)
HEMOGLOBIN: 13.6 g/dL (ref 11.6–15.9)
LYMPH%: 43.2 % (ref 14.0–49.7)
MCH: 30.2 pg (ref 25.1–34.0)
MCHC: 32.9 g/dL (ref 31.5–36.0)
MCV: 91.6 fL (ref 79.5–101.0)
MONO#: 0.3 10*3/uL (ref 0.1–0.9)
MONO%: 5.8 % (ref 0.0–14.0)
NEUT%: 47.6 % (ref 38.4–76.8)
NEUTROS ABS: 2.4 10*3/uL (ref 1.5–6.5)
PLATELETS: 250 10*3/uL (ref 145–400)
RBC: 4.51 10*6/uL (ref 3.70–5.45)
RDW: 13.5 % (ref 11.2–14.5)
WBC: 5 10*3/uL (ref 3.9–10.3)
lymph#: 2.2 10*3/uL (ref 0.9–3.3)

## 2013-05-02 LAB — COMPREHENSIVE METABOLIC PANEL (CC13)
ALK PHOS: 77 U/L (ref 40–150)
ALT: 20 U/L (ref 0–55)
AST: 18 U/L (ref 5–34)
Albumin: 4.1 g/dL (ref 3.5–5.0)
Anion Gap: 10 mEq/L (ref 3–11)
BILIRUBIN TOTAL: 0.27 mg/dL (ref 0.20–1.20)
BUN: 16.1 mg/dL (ref 7.0–26.0)
CO2: 27 mEq/L (ref 22–29)
Calcium: 9.7 mg/dL (ref 8.4–10.4)
Chloride: 108 mEq/L (ref 98–109)
Creatinine: 0.8 mg/dL (ref 0.6–1.1)
Glucose: 99 mg/dl (ref 70–140)
Potassium: 4 mEq/L (ref 3.5–5.1)
SODIUM: 146 meq/L — AB (ref 136–145)
Total Protein: 6.8 g/dL (ref 6.4–8.3)

## 2013-05-02 NOTE — Telephone Encounter (Signed)
Orders faxed

## 2013-05-02 NOTE — Progress Notes (Signed)
Contacted pt and informed her that per Dr. Marin Olp all labworked looked good. Instructed her to contact the office with any further questions or concerns.

## 2013-05-02 NOTE — Telephone Encounter (Signed)
They drew the labs for [place] and now can you please fax over lab orders so they can send off and bill

## 2013-05-03 ENCOUNTER — Telehealth: Payer: Self-pay | Admitting: Oncology

## 2013-05-03 NOTE — Telephone Encounter (Signed)
Left voice mail

## 2013-05-03 NOTE — Telephone Encounter (Addendum)
Message copied by Cottie Banda on Tue May 03, 2013  1:42 PM ------      Message from: Burney Gauze R      Created: Mon May 02, 2013  5:19 PM       Please call and let her know that her liver tests are back to normal. He had to be high because of the antibiotic that she was on or the infection that she had. Her cholesterol is about the same. Thanks. Pete ------Left voicemail message.

## 2013-05-04 ENCOUNTER — Ambulatory Visit (INDEPENDENT_AMBULATORY_CARE_PROVIDER_SITE_OTHER): Payer: BC Managed Care – PPO | Admitting: Endocrinology

## 2013-05-04 ENCOUNTER — Encounter: Payer: Self-pay | Admitting: Endocrinology

## 2013-05-04 VITALS — BP 110/64 | HR 67 | Temp 97.8°F | Resp 14 | Ht 64.0 in | Wt 133.4 lb

## 2013-05-04 DIAGNOSIS — E039 Hypothyroidism, unspecified: Secondary | ICD-10-CM

## 2013-05-04 DIAGNOSIS — E7800 Pure hypercholesterolemia, unspecified: Secondary | ICD-10-CM

## 2013-05-04 NOTE — Progress Notes (Signed)
Patient ID: Rachel [name], female   DOB: 26-Jul-1965, 48 y.o.   MRN: [id number]   Reason for Appointment:  Hypothyroidism, followup visit   History of Present Illness:   The hypothyroidism was first diagnosed in 1992  With a TSH of 51  Her thyroid dosage has been typically difficult to regulate and requires fairly frequent adjustments of dosage despite her being compliant with brand name Synthroid. Over the years she has taken a wide range of doses for her supplement  Complaints are reported by the patient now are none. On her last visit she was having some fatigue and difficulty losing weight. She tends to have mild chronic hair loss, dry skin and cold sensitivity which is not new.   On her last visit her TSH was low at 0.2 with a regimen of 100 mcg and she was told to take 6 a half tablets a week Had been on doses as high as 125 mcg in the last couple of years       The treatments that the patient has taken include Synthroid 100 mg recently and previously was on 125 mcg.        TSH on 05/02/13 = 1.49 with normal free T4            Compliance with the medical regimen has been as prescribed with taking the tablet in the morning before breakfast.  Wt Readings from Last 3 Encounters:  05/04/13 133 lb 6.4 oz (60.51 kg)  04/28/13 131 lb (59.421 kg)  03/31/13 136 lb (61.689 kg)    Previous levels: TSH in 5/14 was 0.03 with a free T4 of 1.68, TSH in 11/13  was 0.32   Appointment on 05/02/2013  Component Date Value Ref Range Status  . Cholesterol 05/02/2013 229* 0 - 200 mg/dL Final   ATP III Classification:      < 200        mg/dL        Desirable     200 - 239     mg/dL        Borderline High     >= 240        mg/dL        High   . Triglycerides 05/02/2013 138  <150 mg/dL Final  . HDL 05/02/2013 51  >39 mg/dL Final  . Total CHOL/HDL Ratio 05/02/2013 4.5   Final  . VLDL 05/02/2013 28  0 - 40 mg/dL Final  . LDL Cholesterol 05/02/2013 150* 0 - 99 mg/dL Final   Comment:  Total  Cholesterol/HDL Ratio:CHD Risk                       Coronary Heart Disease Risk Table                                       Men       Women         1/2 Average Risk              3.4        3.3             Average Risk              5.0        4.4  2X Average Risk              9.6        7.1          3X Average Risk             23.4       11.0Use the calculated Patient Ratio above and the CHD Risk table to determine the patient's CHD Risk.ATP III Classification (LDL):      < 100                                  mg/dL         Optimal     100 - 129     mg/dL         Near or Above Optimal     130 - 159     mg/dL         Borderline High     160 - 189     mg/dL         High      > 190        mg/dL         Very High   . Sodium 05/02/2013 146* 136 - 145 mEq/L Final  . Potassium 05/02/2013 4.0  3.5 - 5.1 mEq/L Final  . Chloride 05/02/2013 108  98 - 109 mEq/L Final  . CO2 05/02/2013 27  22 - 29 mEq/L Final  . Glucose 05/02/2013 99  70 - 140 mg/dl Final  . BUN 05/02/2013 16.1  7.0 - 26.0 mg/dL Final  . Creatinine 05/02/2013 0.8  0.6 - 1.1 mg/dL Final  . Total Bilirubin 05/02/2013 0.27  0.20 - 1.20 mg/dL Final  . Alkaline Phosphatase 05/02/2013 77  40 - 150 U/L Final  . AST 05/02/2013 18  5 - 34 U/L Final  . ALT 05/02/2013 20  0 - 55 U/L Final  . Total Protein 05/02/2013 6.8  6.4 - 8.3 g/dL Final  . Albumin 05/02/2013 4.1  3.5 - 5.0 g/dL Final  . Calcium 05/02/2013 9.7  8.4 - 10.4 mg/dL Final  . Anion Gap 05/02/2013 10  3 - 11 mEq/L Final  . WBC 05/02/2013 5.0  3.9 - 10.3 10e3/uL Final  . NEUT# 05/02/2013 2.4  1.5 - 6.5 10e3/uL Final  . HGB 05/02/2013 13.6  11.6 - 15.9 g/dL Final  . HCT 05/02/2013 41.3  34.8 - 46.6 % Final  . Platelets 05/02/2013 250  145 - 400 10e3/uL Final  . MCV 05/02/2013 91.6  79.5 - 101.0 fL Final  . MCH 05/02/2013 30.2  25.1 - 34.0 pg Final  . MCHC 05/02/2013 32.9  31.5 - 36.0 g/dL Final  . RBC 05/02/2013 4.51  3.70 - 5.45 10e6/uL Final  . RDW  05/02/2013 13.5  11.2 - 14.5 % Final  . lymph# 05/02/2013 2.2  0.9 - 3.3 10e3/uL Final  . MONO# 05/02/2013 0.3  0.1 - 0.9 10e3/uL Final  . Eosinophils Absolute 05/02/2013 0.1  0.0 - 0.5 10e3/uL Final  . Basophils Absolute 05/02/2013 0.0  0.0 - 0.1 10e3/uL Final  . NEUT% 05/02/2013 47.6  38.4 - 76.8 % Final  . LYMPH% 05/02/2013 43.2  14.0 - 49.7 % Final  . MONO% 05/02/2013 5.8  0.0 - 14.0 % Final  . EOS% 05/02/2013 2.6  0.0 - 7.0 % Final  . BASO% 05/02/2013 0.8  0.0 - 2.0 % Final  Medication List       This list is accurate as of: 05/04/13  3:38 PM.  Always use your most recent med list.               flunisolide 25 MCG/ACT (0.025%) Soln  Commonly known as:  NASALIDE  USE AS DIRECTED     levothyroxine 100 MCG tablet  Commonly known as:  SYNTHROID  Take 1 tablet (100 mcg total) by mouth daily before breakfast.     oxyCODONE-acetaminophen 10-325 MG per tablet  Commonly known as:  PERCOCET  Take 1 tablet by mouth every 6 (six) hours as needed for pain.     predniSONE 20 MG tablet  Commonly known as:  DELTASONE  3 tabs po qd x 2 days, 2 tabs po qd x 2 days, 1 tab po qd x 2 days     promethazine 25 MG tablet  Commonly known as:  PHENERGAN  Take 1 tablet (25 mg total) by mouth every 8 (eight) hours as needed for nausea or vomiting.     rizatriptan 10 MG tablet  Commonly known as:  MAXALT  Take 1 tablet (10 mg total) by mouth as needed for migraine.     VITAMIN D PO  Take 2,000 Units by mouth.         Past Medical History  Diagnosis Date  . ASCUS (atypical squamous cells of undetermined significance) on Pap smear 02/2006, 05/2006    NEG HPV ----- PAP 12/2007 WNL  . Hypothyroidism   . Osteopenia 05/2012    T score -2.2 FRAX 6.5%/1.0%  . Hypercholesteremia   . 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 Cesarean section    . Pelvic laparoscopy    . Colposcopy    .  Tonsillectomy    . Breast lumpectomy      Family History  Problem Relation Age of Onset  . Diabetes Father     Social History:  reports that she has never smoked. She has never used smokeless tobacco. She reports that she does not drink alcohol or use illicit drugs.  Allergies:  Allergies  Allergen Reactions  . Erythromycin Nausea And Vomiting   Review of systems:  She has a history of breast cancer History of osteopenia   Examination:   LMP 04/24/2000   GENERAL APPEARANCE:  looks well  No puffiness of face or hands. No obvious alopecia          Assessment    Hypothyroidism, long-standing with history of variability and TSH With only reducing her dosage by 50 mcg a week on her last visit her TSH is quite normal now and subjectively she is doing well  She is having some hair shedding but unlikely to be related to hypothyroidism She is quite compliant with her medication and using brand-name daily  Hypercholesterolemia: LDL 150, has no other risk factors   Treatment:  No change  Zurisadai Helminiak 05/04/2013, 3:38 PM

## 2013-05-24 ENCOUNTER — Encounter: Payer: Self-pay | Admitting: Endocrinology

## 2013-06-01 ENCOUNTER — Other Ambulatory Visit: Payer: Self-pay | Admitting: Family Medicine

## 2013-06-02 ENCOUNTER — Ambulatory Visit (INDEPENDENT_AMBULATORY_CARE_PROVIDER_SITE_OTHER): Payer: BC Managed Care – PPO | Admitting: Family Medicine

## 2013-06-02 ENCOUNTER — Encounter: Payer: Self-pay | Admitting: Family Medicine

## 2013-06-02 VITALS — BP 118/72 | HR 78 | Resp 18 | Ht 64.0 in | Wt 130.0 lb

## 2013-06-02 DIAGNOSIS — G43909 Migraine, unspecified, not intractable, without status migrainosus: Secondary | ICD-10-CM

## 2013-06-02 MED ORDER — PROMETHAZINE HCL 25 MG/ML IJ SOLN
25.0000 mg | Freq: Once | INTRAMUSCULAR | Status: DC
  Administered 2013-06-02: 25 mg via INTRAMUSCULAR

## 2013-06-02 MED ORDER — PROMETHAZINE HCL 25 MG/ML IJ SOLN: 25.0000 mg | Freq: Once | INTRAMUSCULAR | Status: DC

## 2013-06-02 MED ORDER — KETOROLAC TROMETHAMINE 60 MG/2ML IM SOLN: 60.0000 mg | Freq: Once | INTRAMUSCULAR | Status: AC

## 2013-06-02 MED ORDER — KETOROLAC TROMETHAMINE 60 MG/2ML IM SOLN
60.0000 mg | Freq: Once | INTRAMUSCULAR | Status: DC
  Administered 2013-06-02: 60 mg via INTRAMUSCULAR

## 2013-06-02 NOTE — Progress Notes (Signed)
Subjective:    Patient ID: Rachel [name], female    DOB: [birth year], 48 y.o.   MRN: [id number]  Migraine    Patient is a very pleasant 48 year old white female who presents today with a sudden onset of a headache which began at 5 AM. She has a history of migraines. She describes it as her entire head is hurting. She denies any vision changes. She denies any hearing changes. She denies any neurologic deficits. She reports photophobia and phonophobia and nausea and vomiting. She denies any head injury. She was unable to keep down her Phenergan or maxalt.  She is nauseated. The pain is hurting mainly behind her eyes. Past Medical History  Diagnosis Date  . ASCUS (atypical squamous cells of undetermined significance) on Pap smear 02/2006, 05/2006    NEG HPV ----- PAP 12/2007 WNL  . Hypothyroidism   . Osteopenia 05/2012    T score -2.2 FRAX 6.5%/1.0%  . Hypercholesteremia   . 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 Cholecalciferol (VITAMIN D PO) Take 2,000 Units by mouth.      . flunisolide (NASALIDE) 25 MCG/ACT (0.025%) SOLN USE AS DIRECTED  2 Bottle  4  . levothyroxine (SYNTHROID) 100 MCG tablet Take 1 tablet (100 mcg total) by mouth daily before breakfast.  30 tablet  5  . oxyCODONE-acetaminophen (PERCOCET) 10-325 MG per tablet Take 1 tablet by mouth every 6 (six) hours as needed for pain.  20 tablet  0  . predniSONE (DELTASONE) 20 MG tablet 3 tabs po qd x 2 days, 2 tabs po qd x 2 days, 1 tab po qd x 2 days  12 tablet  0  . promethazine (PHENERGAN) 25 MG tablet Take 1 tablet (25 mg total) by mouth every 8 (eight) hours as needed for nausea or vomiting.  20 tablet  0  . rizatriptan (MAXALT) 10 MG tablet Take 1 tablet (10 mg total) by mouth as needed for migraine.  6 tablet  5   No current facility-administered medications on file prior to visit.   Allergies   Allergen Reactions  . Erythromycin Nausea And Vomiting   History   Social History  . Marital Status: Married    Spouse Name: N/A    Number of Children: N/A  . Years of Education: N/A   Occupational History  . Not on file.   Social History Main Topics  . Smoking status: Never Smoker   . Smokeless tobacco: Never Used     Comment: never used product  . Alcohol Use: No  . Drug Use: No  . Sexual Activity: Yes    Birth Control/ Protection: Post-menopausal   Other Topics Concern  . Not on file   Social History Narrative  . No narrative on file      Review of Systems  All other systems reviewed and are negative.      Objective:   Physical Exam  Vitals reviewed. Constitutional: She is oriented to person, place, and time. She appears well-developed and well-nourished. No distress.  HENT:  Head: Normocephalic and atraumatic.  Eyes: Conjunctivae and EOM are normal. Pupils are equal, round, and reactive to light.  Cardiovascular: Normal rate and regular rhythm.   Pulmonary/Chest: Effort normal and breath sounds normal.  Neurological: She is alert and oriented to person, place, and time. She has normal reflexes. No cranial nerve  deficit. She exhibits normal muscle tone. Coordination normal.  Skin: She is not diaphoretic.   patient is lying in a dark room on exam table.        Assessment & Plan:  1. Migraine, unspecified, without mention of intractable migraine without mention of status migrainosus Give the patient Toradol 60 mg IM times one and Phenergan 25 mg IM x1. On recheck the patient later this morning. - ketorolac (TORADOL) injection 60 mg; Inject 2 mLs (60 mg total) into the muscle once. - promethazine (PHENERGAN) injection 25 mg; Inject 1 mL (25 mg total) into the muscle once.  This is the second severe migraine the patient has had in the last 2 months.  We discussed starting preventative strategies that she could take on a daily basis such as Topamax.  She  will consider.

## 2013-06-21 ENCOUNTER — Other Ambulatory Visit: Payer: Self-pay

## 2013-06-21 DIAGNOSIS — Z1231 Encounter for screening mammogram for malignant neoplasm of breast: Secondary | ICD-10-CM

## 2013-07-06 ENCOUNTER — Ambulatory Visit
Admission: RE | Admit: 2013-07-06 | Discharge: 2013-07-06 | Disposition: A | Discharge status: Home / Self Care | Payer: BC Managed Care – PPO | Source: Ambulatory Visit

## 2013-07-06 DIAGNOSIS — Z1231 Encounter for screening mammogram for malignant neoplasm of breast: Secondary | ICD-10-CM

## 2013-07-18 ENCOUNTER — Encounter: Payer: Self-pay | Admitting: Family Medicine

## 2013-07-18 ENCOUNTER — Ambulatory Visit (INDEPENDENT_AMBULATORY_CARE_PROVIDER_SITE_OTHER): Payer: BC Managed Care – PPO | Admitting: Family Medicine

## 2013-07-18 VITALS — BP 110/74 | HR 72 | Temp 97.2°F | Resp 14 | Ht 64.0 in | Wt 132.0 lb

## 2013-07-18 DIAGNOSIS — J019 Acute sinusitis, unspecified: Secondary | ICD-10-CM

## 2013-07-18 MED ORDER — CEFDINIR 300 MG PO CAPS: 300.0000 mg | ORAL_CAPSULE | Freq: Two times a day (BID) | ORAL | Status: DC

## 2013-07-18 MED ORDER — PREDNISONE 20 MG PO TABS: ORAL_TABLET | ORAL | Status: DC

## 2013-07-18 NOTE — Progress Notes (Signed)
Subjective:    Patient ID: Rachel [name], female    DOB: [birth year], 48 y.o.   MRN: [id number]  HPI Patient is having one week of right maxillary and right frontal sinus pain and pressure. She has tried Flonase and decongestants without benefit. She reports subjective fevers. She reports postnasal drip. She feels like the right side of her face is swollen. She denies any cough or sore throat or otalgia.  The pain is worse when she leans forward. Past Medical History  Diagnosis Date  . ASCUS (atypical squamous cells of undetermined significance) on Pap smear 02/2006, 05/2006    NEG HPV ----- PAP 12/2007 WNL  . Hypothyroidism   . Osteopenia 05/2012    T score -2.2 FRAX 6.5%/1.0%  . Hypercholesteremia   . 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 Cholecalciferol (VITAMIN D PO) Take 2,000 Units by mouth.      . flunisolide (NASALIDE) 25 MCG/ACT (0.025%) SOLN USE AS DIRECTED  2 Bottle  4  . fluticasone (FLONASE) 50 MCG/ACT nasal spray INHALE 2 SPRASY IN EACH NOSTRIL ONCE DAILY  16 g  11  . levothyroxine (SYNTHROID) 100 MCG tablet Take 1 tablet (100 mcg total) by mouth daily before breakfast.  30 tablet  5  . oxyCODONE-acetaminophen (PERCOCET) 10-325 MG per tablet Take 1 tablet by mouth every 6 (six) hours as needed for pain.  20 tablet  0  . promethazine (PHENERGAN) 25 MG tablet Take 1 tablet (25 mg total) by mouth every 8 (eight) hours as needed for nausea or vomiting.  20 tablet  0  . rizatriptan (MAXALT) 10 MG tablet Take 1 tablet (10 mg total) by mouth as needed for migraine.  6 tablet  5   Current Facility-Administered Medications on File Prior to Visit  Medication Dose Route Frequency Provider Last Rate Last Dose  . promethazine (PHENERGAN) injection 25 mg  25 mg Intramuscular Once Susy Frizzle, MD       Allergies  Allergen Reactions  . Erythromycin  Nausea And Vomiting   History   Social History  . Marital Status: Married    Spouse Name: N/A    Number of Children: N/A  . Years of Education: N/A   Occupational History  . Not on file.   Social History Main Topics  . Smoking status: Never Smoker   . Smokeless tobacco: Never Used     Comment: never used product  . Alcohol Use: No  . Drug Use: No  . Sexual Activity: Yes    Birth Control/ Protection: Post-menopausal   Other Topics Concern  . Not on file   Social History Narrative  . No narrative on file      Review of Systems  All other systems reviewed and are negative.      Objective:   Physical Exam  Vitals reviewed. Constitutional: She appears well-developed and well-nourished.  HENT:  Right Ear: Tympanic membrane, external ear and ear canal normal.  Left Ear: Tympanic membrane, external ear and ear canal normal.  Nose: Mucosal edema and rhinorrhea present. Right sinus exhibits maxillary sinus tenderness and frontal sinus tenderness.  Mouth/Throat: Oropharynx is clear and moist. No oropharyngeal exudate.  Neck: Neck supple.  Cardiovascular: Normal rate, regular rhythm and normal heart sounds.   Pulmonary/Chest: Effort normal and breath sounds normal. No respiratory distress. She has no wheezes. She  has no rales.  Lymphadenopathy:    She has no cervical adenopathy.          Assessment & Plan:  1. Acute rhinosinusitis - cefdinir (OMNICEF) 300 MG capsule; Take 1 capsule (300 mg total) by mouth 2 (two) times daily.  Dispense: 20 capsule; Refill: 0 - predniSONE (DELTASONE) 20 MG tablet; 3 tabs poqday 1-2, 2 tabs poqday 3-4, 1 tab poqday 5-6  Dispense: 12 tablet; Refill: 0

## 2013-08-17 ENCOUNTER — Other Ambulatory Visit: Payer: Self-pay | Admitting: Dermatology

## 2013-09-26 ENCOUNTER — Ambulatory Visit (HOSPITAL_BASED_OUTPATIENT_CLINIC_OR_DEPARTMENT_OTHER): Payer: BC Managed Care – PPO | Admitting: Hematology & Oncology

## 2013-09-26 ENCOUNTER — Encounter: Payer: Self-pay | Admitting: Hematology & Oncology

## 2013-09-26 ENCOUNTER — Other Ambulatory Visit (HOSPITAL_BASED_OUTPATIENT_CLINIC_OR_DEPARTMENT_OTHER): Payer: BC Managed Care – PPO | Admitting: Lab

## 2013-09-26 VITALS — BP 108/67 | HR 55 | Temp 97.8°F | Resp 14 | Ht 64.0 in | Wt 130.0 lb

## 2013-09-26 DIAGNOSIS — M899 Disorder of bone, unspecified: Secondary | ICD-10-CM

## 2013-09-26 DIAGNOSIS — M8580 Other specified disorders of bone density and structure, unspecified site: Secondary | ICD-10-CM

## 2013-09-26 DIAGNOSIS — E7800 Pure hypercholesterolemia, unspecified: Secondary | ICD-10-CM

## 2013-09-26 DIAGNOSIS — C50919 Malignant neoplasm of unspecified site of unspecified female breast: Secondary | ICD-10-CM

## 2013-09-26 DIAGNOSIS — M949 Disorder of cartilage, unspecified: Secondary | ICD-10-CM

## 2013-09-26 DIAGNOSIS — C50911 Malignant neoplasm of unspecified site of right female breast: Secondary | ICD-10-CM

## 2013-09-26 LAB — CBC WITH DIFFERENTIAL (CANCER CENTER ONLY)
BASO#: 0 10*3/uL (ref 0.0–0.2)
BASO%: 0.4 % (ref 0.0–2.0)
EOS ABS: 0.2 10*3/uL (ref 0.0–0.5)
EOS%: 2.9 % (ref 0.0–7.0)
HCT: 42.2 % (ref 34.8–46.6)
HGB: 14.3 g/dL (ref 11.6–15.9)
LYMPH#: 3.1 10*3/uL (ref 0.9–3.3)
LYMPH%: 45.4 % (ref 14.0–48.0)
MCH: 30.9 pg (ref 26.0–34.0)
MCHC: 33.9 g/dL (ref 32.0–36.0)
MCV: 91 fL (ref 81–101)
MONO#: 0.5 10*3/uL (ref 0.1–0.9)
MONO%: 7.5 % (ref 0.0–13.0)
NEUT#: 3 10*3/uL (ref 1.5–6.5)
NEUT%: 43.8 % (ref 39.6–80.0)
Platelets: 224 10*3/uL (ref 145–400)
RBC: 4.63 10*6/uL (ref 3.70–5.32)
RDW: 13 % (ref 11.1–15.7)
WBC: 6.8 10*3/uL (ref 3.9–10.0)

## 2013-09-26 LAB — CMP (CANCER CENTER ONLY)
ALBUMIN: 3.8 g/dL (ref 3.3–5.5)
ALT(SGPT): 18 U/L (ref 10–47)
AST: 20 U/L (ref 11–38)
Alkaline Phosphatase: 61 U/L (ref 26–84)
BUN, Bld: 12 mg/dL (ref 7–22)
CO2: 30 mEq/L (ref 18–33)
Calcium: 9.2 mg/dL (ref 8.0–10.3)
Chloride: 102 mEq/L (ref 98–108)
Creat: 0.9 mg/dl (ref 0.6–1.2)
GLUCOSE: 99 mg/dL (ref 73–118)
Potassium: 4.3 mEq/L (ref 3.3–4.7)
SODIUM: 144 meq/L (ref 128–145)
TOTAL PROTEIN: 6.9 g/dL (ref 6.4–8.1)
Total Bilirubin: 0.8 mg/dl (ref 0.20–1.60)

## 2013-09-26 NOTE — Progress Notes (Signed)
Hematology and Oncology Follow Up Visit  Rachel [name] [id number] 08/26/1965 48 y.o. 09/26/2013   Principle Diagnosis:  Stage I (T1 N0M0) infiltrating ductal carcinoma the right breast  Current Therapy:    Observation     Interim History:  [name] is back for followup. Receiving her every 6 months. Her oldest son got married in June. She showed me a log of pictures from the wedding. It is like everybody had a very good time.  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  Her health has been doing well. I told her to take one baby aspirin a day. This will help her out.  The last time that we saw her, she had the flu. All of her labs were really abnormal. However, they have normalized. She does have cholesterol issues. She just does not want to take any statin drugs.  She's had no problems with pain. There's been no change in bowel or bladder habits. She's had no cough. She's had no rashes. She-had a dermatofibroma removed from her arm back in July.    Medications: Current outpatient prescriptions:Cholecalciferol (VITAMIN D PO), Take 2,000 Units by mouth., Disp: , Rfl: ;  fluticasone (FLONASE) 50 MCG/ACT nasal spray, INHALE 2 SPRASY IN EACH NOSTRIL ONCE DAILY, Disp: 16 g, Rfl: 11;  levothyroxine (SYNTHROID) 100 MCG tablet, Take 1 tablet (100 mcg total) by mouth daily before breakfast., Disp: 30 tablet, Rfl: 5 rizatriptan (MAXALT) 10 MG tablet, Take 1 tablet (10 mg total) by mouth as needed for migraine., Disp: 6 tablet, Rfl: 5;  flunisolide (NASALIDE) 25 MCG/ACT (0.025%) SOLN, USE AS DIRECTED, Disp: 2 Bottle, Rfl: 4;  promethazine (PHENERGAN) 25 MG tablet, Take 1 tablet (25 mg total) by mouth every 8 (eight) hours as needed for nausea or vomiting., Disp: 20 tablet, Rfl: 0 Current facility-administered medications:promethazine (PHENERGAN) injection 25 mg, 25 mg, Intramuscular, Once, Susy Frizzle, MD  Allergies:  Allergies   Allergen Reactions  . Erythromycin Nausea And Vomiting    Past Medical History, Surgical history, Social history, and Family History were reviewed and updated.  Review of Systems: As above  Physical Exam:  height is 5\' 4"  (1.626 m) and weight is 130 lb (58.968 kg). Her oral temperature is 97.8 F (36.6 C). Her blood pressure is 108/67 and her pulse is 55. Her respiration is 14.   Well-developed and well-nourished white female. Head and neck exam shows no ocular or oral lesions. She has no palpable cervical or supraclavicular lymph nodes. Lungs are clear bilaterally. Cardiac exam regular in rhythm with no murmurs rubs or bruits. Abdomen is soft. Has good bowel sounds. There is no fluid wave. There is no palpable liver or spleen tip. Breast exam shows left breast no masses edema or erythema. There is no left axillary adenopathy. Right breast is slightly contracted from surgery and radiation. She has a well-healed lumpectomy at the 10:00 position. No distinct mass is noted in the right breast. There is no right axillary adenopathy. Back exam shows no tenderness over the spine ribs or hips. Extremities shows no clubbing cyanosis or edema. Skin exam no rashes, ecchymosis or petechia. Neurological exam is nonfocal.  Lab Results  Component Value Date   WBC 6.8 09/26/2013   HGB 14.3 09/26/2013   HCT 42.2 09/26/2013   MCV 91 09/26/2013   PLT 224 09/26/2013     Chemistry      Component Value Date/Time  NA 144 09/26/2013 1129   NA 146* 05/02/2013 0826   NA 139 03/31/2013 1540   K 4.3 09/26/2013 1129   K 4.0 05/02/2013 0826   K 4.0 03/31/2013 1540   CL 102 09/26/2013 1129   CL 101 03/31/2013 1540   CO2 30 09/26/2013 1129   CO2 27 05/02/2013 0826   CO2 30 03/31/2013 1540   BUN 12 09/26/2013 1129   BUN 16.1 05/02/2013 0826   BUN 21 03/31/2013 1540   CREATININE 0.9 09/26/2013 1129   CREATININE 0.8 05/02/2013 0826   CREATININE 0.63 03/31/2013 1540      Component Value Date/Time   CALCIUM 9.2 09/26/2013 1129    CALCIUM 9.7 05/02/2013 0826   CALCIUM 9.0 03/31/2013 1540   ALKPHOS 61 09/26/2013 1129   ALKPHOS 77 05/02/2013 0826   ALKPHOS 122* 03/31/2013 1540   AST 20 09/26/2013 1129   AST 18 05/02/2013 0826   AST 88* 03/31/2013 1540   ALT 18 09/26/2013 1129   ALT 20 05/02/2013 0826   ALT 213* 03/31/2013 1540   BILITOT 0.80 09/26/2013 1129   BILITOT 0.27 05/02/2013 0826   BILITOT 0.2 03/31/2013 1540         Impression and Plan: Rachel [name] is a 48 year old white female with a history of stage I ductal carcinoma of the right breast. She was diagnosed 14 years ago. 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 I would that she is cured.  I really need to talk to her about getting genetic testing. I don't think she's had this yet. It certainly would be reasonable to do this.  I'll see her back in 6 months.   Volanda Napoleon, MD 8/17/201512:29 PM

## 2013-09-27 LAB — LIPID PANEL
CHOL/HDL RATIO: 5.5 ratio
Cholesterol: 278 mg/dL — ABNORMAL HIGH (ref 0–200)
HDL: 51 mg/dL (ref >39)
LDL Cholesterol: 199 mg/dL — ABNORMAL HIGH (ref 0–99)
Triglycerides: 140 mg/dL (ref <150)
VLDL: 28 mg/dL (ref 0–40)

## 2013-09-27 LAB — VITAMIN D 25 HYDROXY (VIT D DEFICIENCY, FRACTURES): VIT D 25 HYDROXY: 48 ng/mL (ref 30–89)

## 2013-10-03 ENCOUNTER — Encounter: Payer: Self-pay | Admitting: Family Medicine

## 2013-10-03 ENCOUNTER — Encounter: Payer: Self-pay | Admitting: Hematology & Oncology

## 2013-10-19 ENCOUNTER — Other Ambulatory Visit: Payer: Self-pay | Admitting: *Deleted

## 2013-10-19 MED ORDER — LEVOTHYROXINE SODIUM 100 MCG PO TABS: 100.0000 ug | ORAL_TABLET | Freq: Every day | ORAL | Status: DC

## 2013-11-07 ENCOUNTER — Other Ambulatory Visit: Payer: BC Managed Care – PPO

## 2013-11-07 ENCOUNTER — Telehealth: Payer: Self-pay | Admitting: *Deleted

## 2013-11-07 DIAGNOSIS — E039 Hypothyroidism, unspecified: Secondary | ICD-10-CM

## 2013-11-07 NOTE — Telephone Encounter (Signed)
Received call from patient.   Reports that she was to have labs obtained for Dr. Dwyane Dee at [facility].  States that phlebotomist at their office is out sick.   Requested to have labs obtained at [facility].  Call placed to endocrinologist office to have requested labs faxed to [facility].

## 2013-11-08 LAB — TSH: TSH: 8.844 u[IU]/mL — ABNORMAL HIGH (ref 0.350–4.500)

## 2013-11-08 LAB — T4, FREE: FREE T4: 1.13 ng/dL (ref 0.80–1.80)

## 2013-11-10 ENCOUNTER — Encounter: Payer: Self-pay | Admitting: Endocrinology

## 2013-11-10 ENCOUNTER — Telehealth: Payer: Self-pay | Admitting: Endocrinology

## 2013-11-10 ENCOUNTER — Ambulatory Visit (INDEPENDENT_AMBULATORY_CARE_PROVIDER_SITE_OTHER): Payer: BC Managed Care – PPO | Admitting: Endocrinology

## 2013-11-10 VITALS — BP 110/63 | HR 66 | Temp 98.3°F | Resp 14 | Ht 64.0 in | Wt 132.8 lb

## 2013-11-10 DIAGNOSIS — E039 Hypothyroidism, unspecified: Secondary | ICD-10-CM

## 2013-11-10 NOTE — Telephone Encounter (Signed)
They are being faxed now.

## 2013-11-10 NOTE — Patient Instructions (Signed)
1 pill daily extra per week of Synthroid

## 2013-11-10 NOTE — Progress Notes (Signed)
Patient ID: Rachel [name], female   DOB: Apr 08, 1965, 48 y.o.   MRN: [id number]   Reason for Appointment:  Hypothyroidism, followup visit   History of Present Illness:   The hypothyroidism was first diagnosed in 1992  With a TSH of 51  Her thyroxine dosage has been typically difficult to regulate and requires fairly frequent adjustments of dosage despite her being compliant with brand name Synthroid. Over the years she has taken a wide range of doses for her supplement  Complaints are reported by the patient now are feeling cold, taking more naps although she did not think this was unusual.  She tends to have mild chronic hair loss, dry skin and cold sensitivity which is not new.   On her last visit her TSH was quite normal at 1.02, previously was low at 0.2 with a regimen of 100 mcg  She has been taking 6 and a half tablets a week consistently Had been on doses as high as 125 mcg in the last couple of years              Compliance with the medical regimen has been as prescribed with taking the tablet in the morning before breakfast.  Wt Readings from Last 3 Encounters:  11/10/13 132 lb 12.8 oz (60.238 kg)  09/26/13 130 lb (58.968 kg)  07/18/13 132 lb (59.875 kg)    Previous levels: TSH in 5/14 was 0.03 with a free T4 of 1.68, TSH in 11/13  was 0.32  Lab Results  Component Value Date   FREET4 1.13 11/07/2013   FREET4 1.01 12/27/2012   FREET4 1.22 09/24/2012   TSH 8.844* 11/07/2013   TSH 1.02 02/11/2013   TSH 0.21* 12/27/2012      Lab on 11/07/2013  Component Date Value Ref Range Status  . TSH 11/07/2013 8.844* 0.350 - 4.500 uIU/mL Final  . Free T4 11/07/2013 1.13  0.80 - 1.80 ng/dL Final      Medication List       This list is accurate as of: 11/10/13  3:47 PM.  Always use your most recent med list.               fluticasone 50 MCG/ACT nasal spray  Commonly known as:  FLONASE  INHALE 2 SPRASY IN EACH NOSTRIL ONCE DAILY     levothyroxine 100 MCG tablet  Commonly  known as:  SYNTHROID  Take 1 tablet (100 mcg total) by mouth daily before breakfast.     promethazine 25 MG tablet  Commonly known as:  PHENERGAN  Take 1 tablet (25 mg total) by mouth every 8 (eight) hours as needed for nausea or vomiting.     rizatriptan 10 MG tablet  Commonly known as:  MAXALT  Take 1 tablet (10 mg total) by mouth as needed for migraine.     VITAMIN D PO  Take 2,000 Units by mouth.         Past Medical History  Diagnosis Date  . ASCUS (atypical squamous cells of undetermined significance) on Pap smear 02/2006, 05/2006    NEG HPV ----- PAP 12/2007 WNL  . Hypothyroidism   . Osteopenia 05/2012    T score -2.2 FRAX 6.5%/1.0%  . Hypercholesteremia   . 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 Cesarean section    . Pelvic laparoscopy    . Colposcopy    . Tonsillectomy    .  Breast lumpectomy      Family History  Problem Relation Age of Onset  . Diabetes Father     Social History:  reports that she has never smoked. She has never used smokeless tobacco. She reports that she does not drink alcohol or use illicit drugs.  Allergies:  Allergies  Allergen Reactions  . Erythromycin Nausea And Vomiting   Review of systems:  She has a history of breast cancer History of osteopenia Hypercholesterolemia: LDL 150, has no other risk factors   Examination:   BP 110/63  Pulse 66  Temp(Src) 98.3 F (36.8 C)  Resp 14  Ht 5\' 4"  (1.626 m)  Wt 132 lb 12.8 oz (60.238 kg)  BMI 22.78 kg/m2  SpO2 97%  LMP 04/24/2000   GENERAL APPEARANCE:  looks well  No puffiness of face or hands Biceps reflexes show slow relaxation Skin appears normal.         Assessment    Hypothyroidism, long-standing with history of variability and TSH With  continuing the same dose despite a normal TSH earlier this year her TSH has gone up significantly She appears to be having mild  symptoms of hypothyroidism also She is quite compliant with her medication and using brand-name daily    Treatment:   She will add an extra 100 mcg of levothyroxine per week in your regimen will be 7-1/2 tablets a week Followup in 3 months unless she has more symptoms  Abria Vannostrand 11/10/2013, 3:47 PM

## 2013-11-10 NOTE — Telephone Encounter (Signed)
Patient would like to know if Dr Dwyane Dee has her lab results

## 2013-12-12 ENCOUNTER — Encounter: Payer: Self-pay | Admitting: Endocrinology

## 2013-12-19 ENCOUNTER — Other Ambulatory Visit: Payer: Self-pay | Admitting: Family Medicine

## 2013-12-19 ENCOUNTER — Other Ambulatory Visit: Payer: Self-pay | Admitting: Endocrinology

## 2014-02-13 ENCOUNTER — Other Ambulatory Visit: Payer: Self-pay | Admitting: Endocrinology

## 2014-02-13 ENCOUNTER — Other Ambulatory Visit: Payer: BC Managed Care – PPO

## 2014-02-13 ENCOUNTER — Encounter: Payer: Self-pay | Admitting: Endocrinology

## 2014-02-13 LAB — T4, FREE: Free T4: 1.56 ng/dL (ref 0.80–1.80)

## 2014-02-13 LAB — TSH: TSH: 0.055 u[IU]/mL — AB (ref 0.350–4.500)

## 2014-02-16 ENCOUNTER — Encounter: Payer: Self-pay | Admitting: Endocrinology

## 2014-02-16 ENCOUNTER — Ambulatory Visit (INDEPENDENT_AMBULATORY_CARE_PROVIDER_SITE_OTHER): Payer: BC Managed Care – PPO | Admitting: Endocrinology

## 2014-02-16 VITALS — BP 120/64 | HR 61 | Temp 97.7°F | Resp 14 | Ht 64.0 in | Wt 135.4 lb

## 2014-02-16 DIAGNOSIS — E039 Hypothyroidism, unspecified: Secondary | ICD-10-CM

## 2014-02-16 NOTE — Progress Notes (Signed)
Patient ID: Rachel [name], female   DOB: [birth year], 49 y.o.   MRN: [id number]   Reason for Appointment:  Hypothyroidism, followup visit   History of Present Illness:   The hypothyroidism was first diagnosed in 1992  With a TSH of 51  Her thyroxine dosage has been typically difficult to regulate and requires fairly frequent adjustments of dosage despite her being compliant with brand name Synthroid. Over the years she has taken a wide range of doses for her supplement  On her last visit her TSH was significantly high at 8.4, previously was quite normal at 1.02 She had been taking 6 and a half tablets a week and was told to take 7-1/2 tablets weekly which she is taking 8 tablets a week now by mistake. However her previous symptoms of fatigue, sleepiness and cold intolerance improved after increasing her dose. She feels fairly good now without any unusual fatigue can also no feeling of jitteriness or tachycardia She tends to have mild chronic hair loss, dry skin and cold sensitivity which is not new.             Compliance with the medical regimen has been as prescribed with taking the tablet in the morning before breakfast. She continues to take brand-name Synthroid  Wt Readings from Last 3 Encounters:  02/16/14 135 lb 6.4 oz (61.417 kg)  11/10/13 132 lb 12.8 oz (60.238 kg)  09/26/13 130 lb (58.968 kg)    Previous levels: TSH in 5/14 was 0.03 with a free T4 of 1.68, TSH in 11/13  was 0.32  Lab Results  Component Value Date   FREET4 1.56 02/13/2014   FREET4 1.13 11/07/2013   FREET4 1.01 12/27/2012   TSH 0.055* 02/13/2014   TSH 8.844* 11/07/2013   TSH 1.02 02/11/2013      Orders Only on 02/13/2014  Component Date Value Ref Range Status  . TSH 02/13/2014 0.055* 0.350 - 4.500 uIU/mL Final  . Free T4 02/13/2014 1.56  0.80 - 1.80 ng/dL Final      Medication List       This list is accurate as of: 02/16/14  4:37 PM.  Always use your most recent med list.               fluticasone 50 MCG/ACT nasal spray  Commonly known as:  FLONASE  INHALE 2 SPRASY IN EACH NOSTRIL ONCE DAILY     promethazine 25 MG tablet  Commonly known as:  PHENERGAN  Take 1 tablet (25 mg total) by mouth every 8 (eight) hours as needed for nausea or vomiting.     rizatriptan 10 MG tablet  Commonly known as:  MAXALT  TAKE 1 TABLET (10 MG TOTAL) BY MOUTH AS NEEDED FOR MIGRAINE.     SYNTHROID 100 MCG tablet  Generic drug:  levothyroxine  TAKE 1 TABLET (100 MCG TOTAL) BY MOUTH DAILY BEFORE BREAKFAST.     VITAMIN D PO  Take 2,000 Units by mouth.         Past Medical History  Diagnosis Date  . ASCUS (atypical squamous cells of undetermined significance) on Pap smear 02/2006, 05/2006    NEG HPV ----- PAP 12/2007 WNL  . Hypothyroidism   . Osteopenia 05/2012    T score -2.2 FRAX 6.5%/1.0%  . Hypercholesteremia   . 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 Cesarean section    . Pelvic  laparoscopy    . Colposcopy    . Tonsillectomy    . Breast lumpectomy      Family History  Problem Relation Age of Onset  . Diabetes Father     Social History:  reports that she has never smoked. She has never used smokeless tobacco. She reports that she does not drink alcohol or use illicit drugs.  Allergies:  Allergies  Allergen Reactions  . Erythromycin Nausea And Vomiting   Review of systems:  She has a history of breast cancer Has occasional hot flushes  History of osteopenia Hypercholesterolemia: LDL 150, has no other risk factors   Examination:   BP 120/64 mmHg  Pulse 61  Temp(Src) 97.7 F (36.5 C)  Resp 14  Ht 5\' 4"  (1.626 m)  Wt 135 lb 6.4 oz (61.417 kg)  BMI 23.23 kg/m2  SpO2 97%  LMP 04/24/2000   GENERAL APPEARANCE:  looks well   Biceps reflexes show normal relaxation Skin appears normal.  No tremor        Assessment    Hypothyroidism, long-standing with  history of variability and TSH With increasing the dose on her last visit when she was hypothyroid she is now mildly hyperthyroid biochemically This is despite changing her daily dose by less than 25 g    Treatment:   She will again reduce her dose and take 100 g daily which will reduce her dose by 14 g a day She will get her TSH checked next month when she has labs drawn with her oncologist  Followup in 3 months unless she has new symptoms   Rachel [name] 02/16/2014, 4:37 PM

## 2014-02-16 NOTE — Patient Instructions (Signed)
Take 1 pill daily                                                                                                                                                                                                                                                                                                                                                                                                                       pill daily

## 2014-03-10 ENCOUNTER — Ambulatory Visit (INDEPENDENT_AMBULATORY_CARE_PROVIDER_SITE_OTHER): Payer: BC Managed Care – PPO | Admitting: Family Medicine

## 2014-03-10 ENCOUNTER — Encounter: Payer: Self-pay | Admitting: Family Medicine

## 2014-03-10 VITALS — BP 124/80 | HR 60 | Temp 97.9°F | Resp 18 | Wt 135.0 lb

## 2014-03-10 DIAGNOSIS — J019 Acute sinusitis, unspecified: Secondary | ICD-10-CM

## 2014-03-10 MED ORDER — CEFDINIR 300 MG PO CAPS: 300.0000 mg | ORAL_CAPSULE | Freq: Two times a day (BID) | ORAL | Status: DC

## 2014-03-10 NOTE — Progress Notes (Signed)
Subjective:    Patient ID: Rachel [name], female    DOB: [birth year], 49 y.o.   MRN: [id number]  HPI  Patient is a very pleasant 49 year old white female who presents with 3 weeks of maxillary and frontal sinus tenderness to palpation, severe headaches, pain in her jaw and in her teeth, and bloody purulent nasal discharge. She is requesting treatment for sinus infection Past Medical History  Diagnosis Date  . ASCUS (atypical squamous cells of undetermined significance) on Pap smear 02/2006, 05/2006    NEG HPV ----- PAP 12/2007 WNL  . Hypothyroidism   . Osteopenia 05/2012    T score -2.2 FRAX 6.5%/1.0%  . Hypercholesteremia   . 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 Cesarean section    . Pelvic laparoscopy    . Colposcopy    . Tonsillectomy    . Breast lumpectomy     Current Outpatient Prescriptions on File Prior to Visit  Medication Sig Dispense Refill  . Cholecalciferol (VITAMIN D PO) Take 2,000 Units by mouth.    . fluticasone (FLONASE) 50 MCG/ACT nasal spray INHALE 2 SPRASY IN EACH NOSTRIL ONCE DAILY 16 g 11  . promethazine (PHENERGAN) 25 MG tablet Take 1 tablet (25 mg total) by mouth every 8 (eight) hours as needed for nausea or vomiting. 20 tablet 0  . rizatriptan (MAXALT) 10 MG tablet TAKE 1 TABLET (10 MG TOTAL) BY MOUTH AS NEEDED FOR MIGRAINE. 6 tablet 5  . SYNTHROID 100 MCG tablet TAKE 1 TABLET (100 MCG TOTAL) BY MOUTH DAILY BEFORE BREAKFAST. 30 tablet 3   Current Facility-Administered Medications on File Prior to Visit  Medication Dose Route Frequency Provider Last Rate Last Dose  . promethazine (PHENERGAN) injection 25 mg  25 mg Intramuscular Once Susy Frizzle, MD       Allergies  Allergen Reactions  . Erythromycin Nausea And Vomiting   History   Social History  . Marital Status: Married    Spouse Name: N/A    Number of Children: N/A  . Years of Education: N/A     Occupational History  . Not on file.   Social History Main Topics  . Smoking status: Never Smoker   . Smokeless tobacco: Never Used     Comment: never used tobacco  . Alcohol Use: No  . Drug Use: No  . Sexual Activity: Yes    Birth Control/ Protection: Post-menopausal   Other Topics Concern  . Not on file   Social History Narrative     Review of Systems  All other systems reviewed and are negative.      Objective:   Physical Exam  HENT:  Right Ear: External ear normal.  Left Ear: External ear normal.  Nose: Mucosal edema and rhinorrhea present. Right sinus exhibits maxillary sinus tenderness and frontal sinus tenderness. Left sinus exhibits maxillary sinus tenderness and frontal sinus tenderness.  Mouth/Throat: Oropharynx is clear and moist. No oropharyngeal exudate.  Eyes: Conjunctivae are normal. Pupils are equal, round, and reactive to light.  Neck: Neck supple.  Cardiovascular: Normal rate, regular rhythm and normal heart sounds.   No murmur heard. Pulmonary/Chest: Effort normal and breath sounds normal.  Lymphadenopathy:    She has no cervical adenopathy.  Vitals reviewed.         Assessment & Plan:  Acute rhinosinusitis - Plan: cefdinir (OMNICEF) 300 MG capsule  I will treat the  patient on Omnicef 300 mg by mouth twice a day for 10 days. I continue to strongly encouraged the patient to take cholesterol medication given her LDL cholesterol was recently 199 but the patient continues to refuse.

## 2014-03-22 ENCOUNTER — Telehealth: Payer: Self-pay | Admitting: Hematology & Oncology

## 2014-03-22 NOTE — Telephone Encounter (Signed)
Left pt message moved 2-18 appointment time.

## 2014-03-23 ENCOUNTER — Ambulatory Visit (INDEPENDENT_AMBULATORY_CARE_PROVIDER_SITE_OTHER): Payer: BC Managed Care – PPO | Admitting: Gynecology

## 2014-03-23 ENCOUNTER — Encounter: Payer: Self-pay | Admitting: Gynecology

## 2014-03-23 VITALS — BP 120/74 | Ht 64.0 in | Wt 132.0 lb

## 2014-03-23 DIAGNOSIS — Z01419 Encounter for gynecological examination (general) (routine) without abnormal findings: Secondary | ICD-10-CM

## 2014-03-23 DIAGNOSIS — C50911 Malignant neoplasm of unspecified site of right female breast: Secondary | ICD-10-CM

## 2014-03-23 DIAGNOSIS — M8580 Other specified disorders of bone density and structure, unspecified site: Secondary | ICD-10-CM

## 2014-03-23 DIAGNOSIS — N952 Postmenopausal atrophic vaginitis: Secondary | ICD-10-CM

## 2014-03-23 NOTE — Patient Instructions (Signed)
Follow up for bone density as scheduled.  You may obtain a copy of any labs that were done today by logging onto MyChart as outlined in the instructions provided with your AVS (after visit summary). The office will not call with normal lab results but certainly if there are any significant abnormalities then we will contact you.   Health Maintenance, Female A healthy lifestyle and preventative care can promote health and wellness.  Maintain regular health, dental, and eye exams.  Eat a healthy diet. Foods like vegetables, fruits, whole grains, low-fat dairy products, and lean protein foods contain the nutrients you need without too many calories. Decrease your intake of foods high in solid fats, added sugars, and salt. Get information about a proper diet from your caregiver, if necessary.  Regular physical exercise is one of the most important things you can do for your health. Most adults should get at least 150 minutes of moderate-intensity exercise (any activity that increases your heart rate and causes you to sweat) each week. In addition, most adults need muscle-strengthening exercises on 2 or more days a week.   Maintain a healthy weight. The body mass index (BMI) is a screening tool to identify possible weight problems. It provides an estimate of body fat based on height and weight. Your caregiver can help determine your BMI, and can help you achieve or maintain a healthy weight. For adults 20 years and older:  A BMI below 18.5 is considered underweight.  A BMI of 18.5 to 24.9 is normal.  A BMI of 25 to 29.9 is considered overweight.  A BMI of 30 and above is considered obese.  Maintain normal blood lipids and cholesterol by exercising and minimizing your intake of saturated fat. Eat a balanced diet with plenty of fruits and vegetables. Blood tests for lipids and cholesterol should begin at age 20 and be repeated every 5 years. If your lipid or cholesterol levels are high, you are over  50, or you are a high risk for heart disease, you may need your cholesterol levels checked more frequently.Ongoing high lipid and cholesterol levels should be treated with medicines if diet and exercise are not effective.  If you smoke, find out from your caregiver how to quit. If you do not use tobacco, do not start.  Lung cancer screening is recommended for adults aged 55 80 years who are at high risk for developing lung cancer because of a history of smoking. Yearly low-dose computed tomography (CT) is recommended for people who have at least a 30-pack-year history of smoking and are a current smoker or have quit within the past 15 years. A pack year of smoking is smoking an average of 1 pack of cigarettes a day for 1 year (for example: 1 pack a day for 30 years or 2 packs a day for 15 years). Yearly screening should continue until the smoker has stopped smoking for at least 15 years. Yearly screening should also be stopped for people who develop a health problem that would prevent them from having lung cancer treatment.  If you are pregnant, do not drink alcohol. If you are breastfeeding, be very cautious about drinking alcohol. If you are not pregnant and choose to drink alcohol, do not exceed 1 drink per day. One drink is considered to be 12 ounces (355 mL) of beer, 5 ounces (148 mL) of wine, or 1.5 ounces (44 mL) of liquor.  Avoid use of street drugs. Do not share needles with anyone. Ask for help   if you need support or instructions about stopping the use of drugs.  High blood pressure causes heart disease and increases the risk of stroke. Blood pressure should be checked at least every 1 to 2 years. Ongoing high blood pressure should be treated with medicines, if weight loss and exercise are not effective.  If you are 55 to 48 years old, ask your caregiver if you should take aspirin to prevent strokes.  Diabetes screening involves taking a blood sample to check your fasting blood sugar level.  This should be done once every 3 years, after age 45, if you are within normal weight and without risk factors for diabetes. Testing should be considered at a younger age or be carried out more frequently if you are overweight and have at least 1 risk factor for diabetes.  Breast cancer screening is essential preventative care for women. You should practice "breast self-awareness." This means understanding the normal appearance and feel of your breasts and may include breast self-examination. Any changes detected, no matter how small, should be reported to a caregiver. Women in their 20s and 30s should have a clinical breast exam (CBE) by a caregiver as part of a regular health exam every 1 to 3 years. After age 40, women should have a CBE every year. Starting at age 40, women should consider having a mammogram (breast X-ray) every year. Women who have a family history of breast cancer should talk to their caregiver about genetic screening. Women at a high risk of breast cancer should talk to their caregiver about having an MRI and a mammogram every year.  Breast cancer gene (BRCA)-related cancer risk assessment is recommended for women who have family members with BRCA-related cancers. BRCA-related cancers include breast, ovarian, tubal, and peritoneal cancers. Having family members with these cancers may be associated with an increased risk for harmful changes (mutations) in the breast cancer genes BRCA1 and BRCA2. Results of the assessment will determine the need for genetic counseling and BRCA1 and BRCA2 testing.  The Pap test is a screening test for cervical cancer. Women should have a Pap test starting at age 21. Between ages 21 and 29, Pap tests should be repeated every 2 years. Beginning at age 30, you should have a Pap test every 3 years as long as the past 3 Pap tests have been normal. If you had a hysterectomy for a problem that was not cancer or a condition that could lead to cancer, then you no  longer need Pap tests. If you are between ages 65 and 70, and you have had normal Pap tests going back 10 years, you no longer need Pap tests. If you have had past treatment for cervical cancer or a condition that could lead to cancer, you need Pap tests and screening for cancer for at least 20 years after your treatment. If Pap tests have been discontinued, risk factors (such as a new sexual partner) need to be reassessed to determine if screening should be resumed. Some women have medical problems that increase the chance of getting cervical cancer. In these cases, your caregiver may recommend more frequent screening and Pap tests.  The human papillomavirus (HPV) test is an additional test that may be used for cervical cancer screening. The HPV test looks for the virus that can cause the cell changes on the cervix. The cells collected during the Pap test can be tested for HPV. The HPV test could be used to screen women aged 30 years and older, and   should be used in women of any age who have unclear Pap test results. After the age of 30, women should have HPV testing at the same frequency as a Pap test.  Colorectal cancer can be detected and often prevented. Most routine colorectal cancer screening begins at the age of 50 and continues through age 75. However, your caregiver may recommend screening at an earlier age if you have risk factors for colon cancer. On a yearly basis, your caregiver may provide home test kits to check for hidden blood in the stool. Use of a small camera at the end of a tube, to directly examine the colon (sigmoidoscopy or colonoscopy), can detect the earliest forms of colorectal cancer. Talk to your caregiver about this at age 50, when routine screening begins. Direct examination of the colon should be repeated every 5 to 10 years through age 75, unless early forms of pre-cancerous polyps or small growths are found.  Hepatitis C blood testing is recommended for all people born from  1945 through 1965 and any individual with known risks for hepatitis C.  Practice safe sex. Use condoms and avoid high-risk sexual practices to reduce the spread of sexually transmitted infections (STIs). Sexually active women aged 25 and younger should be checked for Chlamydia, which is a common sexually transmitted infection. Older women with new or multiple partners should also be tested for Chlamydia. Testing for other STIs is recommended if you are sexually active and at increased risk.  Osteoporosis is a disease in which the bones lose minerals and strength with aging. This can result in serious bone fractures. The risk of osteoporosis can be identified using a bone density scan. Women ages 65 and over and women at risk for fractures or osteoporosis should discuss screening with their caregivers. Ask your caregiver whether you should be taking a calcium supplement or vitamin D to reduce the rate of osteoporosis.  Menopause can be associated with physical symptoms and risks. Hormone replacement therapy is available to decrease symptoms and risks. You should talk to your caregiver about whether hormone replacement therapy is right for you.  Use sunscreen. Apply sunscreen liberally and repeatedly throughout the day. You should seek shade when your shadow is shorter than you. Protect yourself by wearing long sleeves, pants, a wide-brimmed hat, and sunglasses year round, whenever you are outdoors.  Notify your caregiver of new moles or changes in moles, especially if there is a change in shape or color. Also notify your caregiver if a mole is larger than the size of a pencil eraser.  Stay current with your immunizations. Document Released: 08/12/2010 Document Revised: 05/24/2012 Document Reviewed: 08/12/2010 [employer] Patient Information 2014 [employer].   

## 2014-03-23 NOTE — Progress Notes (Addendum)
Rachel [name] [birth year] [id number]        48 y.o.  [id number] for annual exam.  Several issues noted below.  Past medical history,surgical history, problem list, medications, allergies, family history and social history were all reviewed and documented as reviewed in the EPIC chart.  ROS:  Performed with pertinent positives and negatives included in the history, assessment and plan.   Additional significant findings :  none   Exam: Kim Counsellor Vitals:   03/23/14 1454  BP: 120/74  Height: 5\' 4"  (1.626 m)  Weight: 132 lb (59.875 kg)   General appearance:  Normal affect, orientation and appearance. Skin: Grossly normal HEENT: Without gross lesions.  No cervical or supraclavicular adenopathy. Thyroid normal.  Lungs:  Clear without wheezing, rales or rhonchi Cardiac: RR, without RMG Abdominal:  Soft, nontender, without masses, guarding, rebound, organomegaly or hernia Breasts:  Examined lying and sitting without masses, retractions, discharge or axillary adenopathy. Pelvic:  Ext/BUS/vagina with atrophic changes  Cervix with atrophic changes  Uterus anteverted, normal size, shape and contour, midline and mobile nontender   Adnexa  Without masses or tenderness    Anus and perineum  Normal   Rectovaginal  Normal sphincter tone without palpated masses or tenderness.    Assessment/Plan:  49 y.o. [id number] female for annual exam.   1. Postmenopausal/atrophic genital changes.  Remains without menses. [place] 104. Without significant hot flashes night sweats vaginal dryness. Continue to monitor. Report any vaginal bleeding. 2. Breast cancer, right 2001. Exam NED.  Mammogram coming to 06/2014. Reminded her to schedule this. SBE monthly reviewed. 3. Osteopenia. DEXA 05/2012 T score -2.2 FRAX 6.5%/1% recognizing patient previously on his phosphates. Actonel/Fosamax for 4 years. Discontinued 2014. Schedule DEXA now at 2 year interval.  Increase calcium and vitamin D reviewed. 4. Pap smear/HPV  negative 03/2013. No Pap smear done today. No history of abnormal Pap smears previously. Plan repeat at 3-5 year interval per current screening guidelines. 5. Health maintenance. No routine blood work done as she has this done at her [facility] office is following her for her hypothyroid and hypercholesterolemia. Follow up for bone density, otherwise annually.     [name] MD, 3:21 PM 03/23/2014

## 2014-03-24 LAB — URINALYSIS W MICROSCOPIC + REFLEX CULTURE
BACTERIA UA: NONE SEEN
BILIRUBIN URINE: NEGATIVE
CASTS: NONE SEEN
Crystals: NONE SEEN
GLUCOSE, UA: NEGATIVE mg/dL
HGB URINE DIPSTICK: NEGATIVE
Ketones, ur: NEGATIVE mg/dL
Leukocytes, UA: NEGATIVE
Nitrite: NEGATIVE
Protein, ur: NEGATIVE mg/dL
Specific Gravity, Urine: 1.021 (ref 1.005–1.030)
Squamous Epithelial / LPF: NONE SEEN
Urobilinogen, UA: 0.2 mg/dL (ref 0.0–1.0)
pH: 5 (ref 5.0–8.0)

## 2014-03-28 ENCOUNTER — Other Ambulatory Visit: Payer: Self-pay | Admitting: *Deleted

## 2014-03-28 DIAGNOSIS — C50911 Malignant neoplasm of unspecified site of right female breast: Secondary | ICD-10-CM

## 2014-03-29 ENCOUNTER — Ambulatory Visit (HOSPITAL_BASED_OUTPATIENT_CLINIC_OR_DEPARTMENT_OTHER): Payer: BC Managed Care – PPO | Admitting: Hematology & Oncology

## 2014-03-29 ENCOUNTER — Encounter: Payer: Self-pay | Admitting: Hematology & Oncology

## 2014-03-29 ENCOUNTER — Ambulatory Visit (HOSPITAL_BASED_OUTPATIENT_CLINIC_OR_DEPARTMENT_OTHER): Payer: BC Managed Care – PPO | Admitting: Lab

## 2014-03-29 VITALS — BP 109/50 | HR 70 | Temp 98.3°F | Resp 14 | Ht 64.0 in | Wt 134.0 lb

## 2014-03-29 DIAGNOSIS — C50911 Malignant neoplasm of unspecified site of right female breast: Secondary | ICD-10-CM

## 2014-03-29 DIAGNOSIS — E032 Hypothyroidism due to medicaments and other exogenous substances: Secondary | ICD-10-CM

## 2014-03-29 DIAGNOSIS — M8580 Other specified disorders of bone density and structure, unspecified site: Secondary | ICD-10-CM

## 2014-03-29 DIAGNOSIS — E789 Disorder of lipoprotein metabolism, unspecified: Secondary | ICD-10-CM

## 2014-03-29 DIAGNOSIS — Z853 Personal history of malignant neoplasm of breast: Secondary | ICD-10-CM

## 2014-03-29 LAB — CBC WITH DIFFERENTIAL (CANCER CENTER ONLY)
BASO#: 0 10*3/uL (ref 0.0–0.2)
BASO%: 0.4 % (ref 0.0–2.0)
EOS%: 2.6 % (ref 0.0–7.0)
Eosinophils Absolute: 0.1 10*3/uL (ref 0.0–0.5)
HCT: 41.1 % (ref 34.8–46.6)
HGB: 13.7 g/dL (ref 11.6–15.9)
LYMPH#: 2.6 10*3/uL (ref 0.9–3.3)
LYMPH%: 48.5 % — AB (ref 14.0–48.0)
MCH: 30 pg (ref 26.0–34.0)
MCHC: 33.3 g/dL (ref 32.0–36.0)
MCV: 90 fL (ref 81–101)
MONO#: 0.4 10*3/uL (ref 0.1–0.9)
MONO%: 7.4 % (ref 0.0–13.0)
NEUT#: 2.2 10*3/uL (ref 1.5–6.5)
NEUT%: 41.1 % (ref 39.6–80.0)
PLATELETS: 224 10*3/uL (ref 145–400)
RBC: 4.56 10*6/uL (ref 3.70–5.32)
RDW: 12.4 % (ref 11.1–15.7)
WBC: 5.4 10*3/uL (ref 3.9–10.0)

## 2014-03-29 NOTE — Progress Notes (Signed)
Hematology and Oncology Follow Up Visit  Rachel [name] [id number] March 01, 1965 49 y.o. 03/29/2014   Principle Diagnosis:  Stage I (T1 N0M0) infiltrating ductal carcinoma the right breast  Current Therapy:    Observation     Interim History:  Rachel [name] is back for followup. She is doing quite well. 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  We last saw her back in August. She got to all the holidays without any problems. Her son is still doing well. He has she has 1 son who is married. He is a Engineer, structural. The other son will graduate from [employer] this year. He is interviewing right now.  She feels well. She's had no problem with infection. She's had no fever. She's had no change in bowel or bladder habits. She's not had a monthly cycle now probably for about 12 years.  There's been no leg swelling. She's had no rashes.  She's had no issues with her appetite. There's been no cough. She's had no visual problems. Tomasita Crumble she does see a endocrinologist for her thyroid.    Medications:  Current outpatient prescriptions:  .  Cholecalciferol (VITAMIN D PO), Take 2,000 Units by mouth., Disp: , Rfl:  .  fluticasone (FLONASE) 50 MCG/ACT nasal spray, INHALE 2 SPRASY IN EACH NOSTRIL ONCE DAILY, Disp: 16 g, Rfl: 11 .  promethazine (PHENERGAN) 25 MG tablet, Take 1 tablet (25 mg total) by mouth every 8 (eight) hours as needed for nausea or vomiting., Disp: 20 tablet, Rfl: 0 .  SYNTHROID 100 MCG tablet, TAKE 1 TABLET (100 MCG TOTAL) BY MOUTH DAILY BEFORE BREAKFAST., Disp: 30 tablet, Rfl: 3 .  rizatriptan (MAXALT) 10 MG tablet, TAKE 1 TABLET (10 MG TOTAL) BY MOUTH AS NEEDED FOR MIGRAINE. (Patient not taking: Reported on 03/29/2014), Disp: 6 tablet, Rfl: 5  Current facility-administered medications:  .  promethazine (PHENERGAN) injection 25 mg, 25 mg, Intramuscular, Once, Susy Frizzle, MD  Allergies:  Allergies  Allergen Reactions  . Erythromycin Nausea And Vomiting     Past Medical History, Surgical history, Social history, and Family History were reviewed and updated.  Review of Systems: As above  Physical Exam:  height is 5\' 4"  (1.626 m) and weight is 134 lb (60.782 kg). Her oral temperature is 98.3 F (36.8 C). Her blood pressure is 109/50 and her pulse is 70. Her respiration is 14.   Well-developed and well-nourished white female. Head and neck exam shows no ocular or oral lesions. She has no palpable cervical or supraclavicular lymph nodes. Lungs are clear bilaterally. Cardiac exam regular rate and rhythm with no murmurs rubs or bruits. Abdomen is soft. Has good bowel sounds. There is no fluid wave. There is no palpable liver or spleen tip. Breast exam shows left breast no masses edema or erythema. There is no left axillary adenopathy. Right breast is slightly contracted from surgery and radiation. She has a well-healed lumpectomy at the 10:00 position. No distinct mass is noted in the right breast. There is no right axillary adenopathy. Back exam shows no tenderness over the spine ribs or hips. Extremities shows no clubbing cyanosis or edema. Skin exam no rashes, ecchymosis or petechia. Neurological exam is nonfocal.  Lab Results  Component Value Date   WBC 5.4 03/29/2014   HGB 13.7 03/29/2014   HCT 41.1 03/29/2014   MCV 90 03/29/2014   PLT 224 03/29/2014     Chemistry      Component Value  Date/Time   NA 144 09/26/2013 1129   NA 146* 05/02/2013 0826   NA 139 03/31/2013 1540   K 4.3 09/26/2013 1129   K 4.0 05/02/2013 0826   K 4.0 03/31/2013 1540   CL 102 09/26/2013 1129   CL 101 03/31/2013 1540   CO2 30 09/26/2013 1129   CO2 27 05/02/2013 0826   CO2 30 03/31/2013 1540   BUN 12 09/26/2013 1129   BUN 16.1 05/02/2013 0826   BUN 21 03/31/2013 1540   CREATININE 0.9 09/26/2013 1129   CREATININE 0.8 05/02/2013 0826   CREATININE 0.63 03/31/2013 1540      Component Value Date/Time   CALCIUM 9.2 09/26/2013 1129   CALCIUM 9.7 05/02/2013  0826   CALCIUM 9.0 03/31/2013 1540   ALKPHOS 61 09/26/2013 1129   ALKPHOS 77 05/02/2013 0826   ALKPHOS 122* 03/31/2013 1540   AST 20 09/26/2013 1129   AST 18 05/02/2013 0826   AST 88* 03/31/2013 1540   ALT 18 09/26/2013 1129   ALT 20 05/02/2013 0826   ALT 213* 03/31/2013 1540   BILITOT 0.80 09/26/2013 1129   BILITOT 0.27 05/02/2013 0826   BILITOT 0.2 03/31/2013 1540         Impression and Plan: Rachel [name] is a 49 year old white female with a history of stage I ductal carcinoma of the right breast. She was diagnosed 15 years ago. 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 I would that she is cured.  I think that her beer problem probably will be cholesterol. She does not want take cholesterol medicine. She has had elevated lipids. We will see what her values are today.  She does have her mammograms routinely.  I really need to talk to her about getting genetic testing. I don't think she's had this yet. It certainly would be reasonable to do this.  I'll see her back in 6 months.   Volanda Napoleon, MD 2/17/20165:07 PM

## 2014-03-30 ENCOUNTER — Other Ambulatory Visit: Payer: BC Managed Care – PPO | Admitting: Lab

## 2014-03-30 ENCOUNTER — Ambulatory Visit: Payer: BC Managed Care – PPO | Admitting: Hematology & Oncology

## 2014-03-30 ENCOUNTER — Telehealth: Payer: Self-pay | Admitting: *Deleted

## 2014-03-30 LAB — LIPID PANEL
CHOL/HDL RATIO: 4.8 ratio
CHOLESTEROL: 229 mg/dL — AB (ref 0–200)
HDL: 48 mg/dL (ref >39)
LDL Cholesterol: 137 mg/dL — ABNORMAL HIGH (ref 0–99)
Triglycerides: 220 mg/dL — ABNORMAL HIGH (ref <150)
VLDL: 44 mg/dL — ABNORMAL HIGH (ref 0–40)

## 2014-03-30 LAB — COMPREHENSIVE METABOLIC PANEL
ALT: 16 U/L (ref 0–35)
AST: 18 U/L (ref 0–37)
Albumin: 4.2 g/dL (ref 3.5–5.2)
Alkaline Phosphatase: 63 U/L (ref 39–117)
BUN: 15 mg/dL (ref 6–23)
CALCIUM: 9.7 mg/dL (ref 8.4–10.5)
CO2: 27 mEq/L (ref 19–32)
Chloride: 102 mEq/L (ref 96–112)
Creatinine, Ser: 0.73 mg/dL (ref 0.50–1.10)
Glucose, Bld: 115 mg/dL — ABNORMAL HIGH (ref 70–99)
Potassium: 4.4 mEq/L (ref 3.5–5.3)
Sodium: 141 mEq/L (ref 135–145)
TOTAL PROTEIN: 6.5 g/dL (ref 6.0–8.3)
Total Bilirubin: 0.3 mg/dL (ref 0.2–1.2)

## 2014-03-30 LAB — VITAMIN D 25 HYDROXY (VIT D DEFICIENCY, FRACTURES): Vit D, 25-Hydroxy: 33 ng/mL (ref 30–100)

## 2014-03-30 LAB — TSH: TSH: 0.121 u[IU]/mL — ABNORMAL LOW (ref 0.350–4.500)

## 2014-03-30 NOTE — Telephone Encounter (Addendum)
Message left  ----- Message from Volanda Napoleon, MD sent at 03/30/2014  1:52 PM EST ----- Call - thyroid level is ok.  No change in synthroid.  Pete Call - cholesterol is actually better!!! Laurey Arrow

## 2014-04-22 ENCOUNTER — Other Ambulatory Visit: Payer: Self-pay | Admitting: Endocrinology

## 2014-05-02 ENCOUNTER — Telehealth: Payer: Self-pay | Admitting: Family Medicine

## 2014-05-02 MED ORDER — FLUTICASONE PROPIONATE 50 MCG/ACT NA SUSP: 2.0000 | Freq: Every day | NASAL | Status: DC

## 2014-05-02 MED ORDER — AMOXICILLIN-POT CLAVULANATE 875-125 MG PO TABS: 1.0000 | ORAL_TABLET | Freq: Two times a day (BID) | ORAL | Status: DC

## 2014-05-02 NOTE — Telephone Encounter (Signed)
Ok with augmentin 875 bid for 10 days and flonase 2 sprays ech nostril daily.  I would reserve prednisone unless not better after augmentin.

## 2014-05-02 NOTE — Telephone Encounter (Signed)
lmtrc

## 2014-05-02 NOTE — Telephone Encounter (Signed)
Pt aware of message and script called in to pt pharmacy

## 2014-05-02 NOTE — Telephone Encounter (Signed)
[phone] PT states that she has a history of bad sinuses and she is having a really bad Sinus Headache,teeth are hurting very bad, past 2 nights coughing really bad PT is wanting to know if she could have antibiotic and steroid called in  [facility]

## 2014-05-02 NOTE — Telephone Encounter (Signed)
Last time pt was seen for Acute Rhinosinusitis in Jan 2016, do want to see her first or send something in?

## 2014-05-15 ENCOUNTER — Telehealth: Payer: Self-pay | Admitting: Endocrinology

## 2014-05-15 NOTE — Telephone Encounter (Signed)
Pt has Thursday appt and would like to make appt. done

## 2014-05-16 ENCOUNTER — Other Ambulatory Visit: Payer: BC Managed Care – PPO

## 2014-05-18 ENCOUNTER — Ambulatory Visit: Payer: BC Managed Care – PPO | Admitting: Endocrinology

## 2014-05-18 ENCOUNTER — Other Ambulatory Visit: Payer: Self-pay | Admitting: Family Medicine

## 2014-05-19 NOTE — Telephone Encounter (Signed)
ok 

## 2014-05-19 NOTE — Telephone Encounter (Signed)
?   OK to Refill  

## 2014-05-19 NOTE — Telephone Encounter (Signed)
RX's called in  

## 2014-06-23 ENCOUNTER — Encounter: Payer: Self-pay | Admitting: Family Medicine

## 2014-06-23 ENCOUNTER — Ambulatory Visit (INDEPENDENT_AMBULATORY_CARE_PROVIDER_SITE_OTHER): Payer: BC Managed Care – PPO | Admitting: Family Medicine

## 2014-06-23 VITALS — BP 104/68 | HR 74 | Temp 98.3°F | Resp 16 | Ht 64.0 in | Wt 135.0 lb

## 2014-06-23 DIAGNOSIS — G43001 Migraine without aura, not intractable, with status migrainosus: Secondary | ICD-10-CM

## 2014-06-23 MED ORDER — TOPIRAMATE 50 MG PO TABS: 50.0000 mg | ORAL_TABLET | Freq: Two times a day (BID) | ORAL | Status: DC

## 2014-06-23 NOTE — Progress Notes (Signed)
Subjective:    Patient ID: Rachel [name], female    DOB: 1965-11-06, 49 y.o.   MRN: [id number]  HPI  Patient has had migraines for years.  She also has a history of breast cancer.  Over the last 2 months, she is having almost daily headache. The headache migrates in location. It can be on either side of her head. It can be pulsatile like her typical migraine aura can be chronic and pressure-like. She occasionally has photophobia and phonophobia. She occasionally has nausea. She is concerned that she may have metastasis to her brain from her previous breast cancer. Past Medical History  Diagnosis Date  . ASCUS (atypical squamous cells of undetermined significance) on Pap smear 02/2006, 05/2006    NEG HPV ----- PAP 12/2007 WNL  . Hypothyroidism   . Osteopenia 05/2012    T score -2.2 FRAX 6.5%/1.0%  . Hypercholesteremia   . 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 Cesarean section    . Pelvic laparoscopy    . Colposcopy    . Tonsillectomy    . Breast lumpectomy     Current Outpatient Prescriptions on File Prior to Visit  Medication Sig Dispense Refill  . amoxicillin-clavulanate (AUGMENTIN) 875-125 MG per tablet Take 1 tablet by mouth 2 (two) times daily. 20 tablet 0  . butalbital-acetaminophen-caffeine (FIORICET, ESGIC) 50-325-40 MG per tablet TAKE 1 TABLET BY MOUTH EVERY 6 HOURS AS NEEDED FOR HEADACHE 20 tablet 0  . Cholecalciferol (VITAMIN D PO) Take 2,000 Units by mouth.    . fluticasone (FLONASE) 50 MCG/ACT nasal spray INHALE 2 SPRASY IN EACH NOSTRIL ONCE DAILY 16 g 11  . fluticasone (FLONASE) 50 MCG/ACT nasal spray Place 2 sprays into both nostrils daily. 16 g 6  . promethazine (PHENERGAN) 25 MG tablet TAKE 1 TABLET BY MOUTH EVERY 8 HOURS AS NEEDED FOR NAUSEA/VOMITING 20 tablet 0  . rizatriptan (MAXALT) 10 MG tablet TAKE 1 TABLET (10 MG TOTAL) BY MOUTH AS NEEDED FOR MIGRAINE. (Patient not  taking: Reported on 03/29/2014) 6 tablet 5  . SYNTHROID 100 MCG tablet TAKE 1 TABLET (100 MCG TOTAL) BY MOUTH DAILY BEFORE BREAKFAST. 30 tablet 3   Current Facility-Administered Medications on File Prior to Visit  Medication Dose Route Frequency Provider Last Rate Last Dose  . promethazine (PHENERGAN) injection 25 mg  25 mg Intramuscular Once Susy Frizzle, MD       Allergies  Allergen Reactions  . Erythromycin Nausea And Vomiting   History   Social History  . Marital Status: Married    Spouse Name: N/A  . Number of Children: N/A  . Years of Education: N/A   Occupational History  . Not on file.   Social History Main Topics  . Smoking status: Never Smoker   . Smokeless tobacco: Never Used     Comment: never used tobacco  . Alcohol Use: 0.0 oz/week    0 Standard drinks or equivalent per week  . Drug Use: No  . Sexual Activity: Yes    Birth Control/ Protection: Post-menopausal     Comment: 1st intercourse 49 yo-Fewer than 5 partners   Other Topics Concern  . Not on file   Social History Narrative    Review of Systems  All other systems reviewed and are negative.      Objective:   Physical Exam  Constitutional: She is oriented to person, place, and  time. She appears well-developed and well-nourished.  Cardiovascular: Normal rate, regular rhythm, normal heart sounds and intact distal pulses.  Exam reveals no gallop and no friction rub.   No murmur heard. Pulmonary/Chest: Effort normal and breath sounds normal. No respiratory distress. She has no wheezes. She has no rales.  Abdominal: Soft. Bowel sounds are normal.  Neurological: She is alert and oriented to person, place, and time. She has normal reflexes. She displays normal reflexes. No cranial nerve deficit. She exhibits normal muscle tone. Coordination normal.  Vitals reviewed.         Assessment & Plan:  Migraine without aura and with status migrainosus, not intractable - Plan: topiramate (TOPAMAX) 50 MG  tablet  I believe the patient is having uncontrolled migraines in status migrainosus. I also believe that due to the frequency of the last 2 months, she would benefit from taking a daily prophylactic medication. I will start the patient on Topamax and gradually increase her dosage to 50 mg twice daily. Recheck in one month. Patient was still like me to schedule a CT scan of her brain for her own peace of mind and I will gladly do this although I see no evidence on her physical exam.

## 2014-06-27 ENCOUNTER — Other Ambulatory Visit (INDEPENDENT_AMBULATORY_CARE_PROVIDER_SITE_OTHER): Payer: BC Managed Care – PPO

## 2014-06-27 DIAGNOSIS — E039 Hypothyroidism, unspecified: Secondary | ICD-10-CM | POA: Diagnosis not present

## 2014-06-27 LAB — T4, FREE: Free T4: 1.11 ng/dL (ref 0.60–1.60)

## 2014-06-27 LAB — TSH: TSH: 0.46 u[IU]/mL (ref 0.35–4.50)

## 2014-06-29 ENCOUNTER — Ambulatory Visit
Admission: RE | Admit: 2014-06-29 | Discharge: 2014-06-29 | Disposition: A | Discharge status: Home / Self Care | Payer: BC Managed Care – PPO | Source: Ambulatory Visit | Attending: Family Medicine | Admitting: Family Medicine

## 2014-06-29 DIAGNOSIS — G43001 Migraine without aura, not intractable, with status migrainosus: Secondary | ICD-10-CM

## 2014-06-30 ENCOUNTER — Ambulatory Visit (INDEPENDENT_AMBULATORY_CARE_PROVIDER_SITE_OTHER): Payer: BC Managed Care – PPO | Admitting: Endocrinology

## 2014-06-30 ENCOUNTER — Encounter: Payer: Self-pay | Admitting: Endocrinology

## 2014-06-30 VITALS — BP 88/60 | HR 70 | Temp 97.7°F | Ht 64.0 in | Wt 135.0 lb

## 2014-06-30 DIAGNOSIS — E038 Other specified hypothyroidism: Secondary | ICD-10-CM | POA: Diagnosis not present

## 2014-06-30 DIAGNOSIS — E063 Autoimmune thyroiditis: Secondary | ICD-10-CM

## 2014-06-30 MED ORDER — THYROID 60 MG PO TABS: ORAL_TABLET | ORAL | Status: DC

## 2014-06-30 NOTE — Progress Notes (Signed)
Pre visit review using our clinic review tool, if applicable. No additional management support is needed unless otherwise documented below in the visit note. 

## 2014-06-30 NOTE — Progress Notes (Signed)
Patient ID: Rachel [name], female   DOB: 1965/02/25, 49 y.o.   MRN: [id number]   Reason for Appointment:  Hypothyroidism, followup visit   History of Present Illness:   The hypothyroidism was first diagnosed in 1992  With a TSH of 51  Her thyroxine dosage has been typically difficult to regulate and requires fairly frequent adjustments of dosage despite her being compliant with brand name Synthroid. Over the years she has taken a wide range of doses for her supplement  On her last visit her TSH was still relatively low at 0.05 and her dose was reduced to 100 g daily Follow-up results on 03/30/14 were not forwarded to this office  She feels tired for the last few weeks and also more cold intolerant than usual. She says she wakes up feeling tired and is also fairly tired at the end of the day.  She does not have any insomnia or mood swings and has not had any anemia or other reasons to be tired Has not discussed her fatigue with PCP  She tends to have mild chronic hair loss, dry skin and cold sensitivity anyway            Compliance with the medical regimen has been as prescribed with taking the tablet in the morning before breakfast. She continues to take brand-name Synthroid  Wt Readings from Last 3 Encounters:  06/30/14 135 lb (61.236 kg)  06/23/14 135 lb (61.236 kg)  03/29/14 134 lb (60.782 kg)    Lab results:  Lab Results  Component Value Date   FREET4 1.11 06/27/2014   FREET4 1.56 02/13/2014   FREET4 1.13 11/07/2013   TSH 0.46 06/27/2014   TSH 0.121* 03/30/2014   TSH 0.055* 02/13/2014      Lab on 06/27/2014  Component Date Value Ref Range Status  . TSH 06/27/2014 0.46  0.35 - 4.50 uIU/mL Final  . Free T4 06/27/2014 1.11  0.60 - 1.60 ng/dL Final      Medication List       This list is accurate as of: 06/30/14  4:08 PM.  Always use your most recent med list.               butalbital-acetaminophen-caffeine 50-325-40 MG per tablet  Commonly known as:  FIORICET,  ESGIC  TAKE 1 TABLET BY MOUTH EVERY 6 HOURS AS NEEDED FOR HEADACHE     fluticasone 50 MCG/ACT nasal spray  Commonly known as:  FLONASE  INHALE 2 SPRASY IN EACH NOSTRIL ONCE DAILY     promethazine 25 MG tablet  Commonly known as:  PHENERGAN  TAKE 1 TABLET BY MOUTH EVERY 8 HOURS AS NEEDED FOR NAUSEA/VOMITING     rizatriptan 10 MG tablet  Commonly known as:  MAXALT  TAKE 1 TABLET (10 MG TOTAL) BY MOUTH AS NEEDED FOR MIGRAINE.     SYNTHROID 100 MCG tablet  Generic drug:  levothyroxine  TAKE 1 TABLET (100 MCG TOTAL) BY MOUTH DAILY BEFORE BREAKFAST.     topiramate 50 MG tablet  Commonly known as:  TOPAMAX  Take 1 tablet (50 mg total) by mouth 2 (two) times daily.     VITAMIN D PO  Take 2,000 Units by mouth.         Past Medical History  Diagnosis Date  . ASCUS (atypical squamous cells of undetermined significance) on Pap smear 02/2006, 05/2006    NEG HPV ----- PAP 12/2007 WNL  . Hypothyroidism   . Osteopenia 05/2012    T score -2.2  FRAX 6.5%/1.0%  . Hypercholesteremia   . 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 Cesarean section    . Pelvic laparoscopy    . Colposcopy    . Tonsillectomy    . Breast lumpectomy      Family History  Problem Relation Age of Onset  . Diabetes Father     Social History:  reports that she has never smoked. She has never used smokeless tobacco. She reports that she drinks alcohol. She reports that she does not use illicit drugs.  Allergies:  Allergies  Allergen Reactions  . Erythromycin Nausea And Vomiting   Review of systems:  She has a history of breast cancer Has occasional hot flushes  History of osteopenia Hypercholesterolemia: LDL 150, has no other risk factors   Examination:   BP 88/60 mmHg  Pulse 70  Temp(Src) 97.7 F (36.5 C) (Oral)  Ht 5\' 4"  (1.626 m)  Wt 135 lb (61.236 kg)  BMI 23.16 kg/m2  SpO2 97%  LMP  04/24/2000   GENERAL APPEARANCE:  looks well   Biceps reflexes show slightly slow relaxation Thyroid not palpable Skin appears normal.  No tremor        Assessment    Hypothyroidism, long-standing with history of variability and TSH With reducing her dose to 100 g her TSH is finally come back to normal although still on the low end However she complains of unusual fatigue and cold intolerance Does not appear to have any other cause for fatigue   Treatment:  Discussed with the patient that since she is symptomatically not doing well with normal thyroid levels she can be given a trial for 30 days with Armour Thyroid.  She will take the equivalent of 100 g of Synthroid and take 60 mg alternating with 90 mg, thyroid If she feels better subjectively she can continue this dose otherwise go back to Synthroid   She will again follow-up, depending on what medication she decides to stay on   [facility] 06/30/2014, 4:08 PM

## 2014-07-11 ENCOUNTER — Other Ambulatory Visit: Payer: Self-pay | Admitting: Family Medicine

## 2014-07-11 DIAGNOSIS — G43001 Migraine without aura, not intractable, with status migrainosus: Secondary | ICD-10-CM

## 2014-07-18 ENCOUNTER — Ambulatory Visit
Admission: RE | Admit: 2014-07-18 | Discharge: 2014-07-18 | Disposition: A | Discharge status: Home / Self Care | Payer: BC Managed Care – PPO | Source: Ambulatory Visit | Attending: Family Medicine | Admitting: Family Medicine

## 2014-07-18 ENCOUNTER — Encounter: Payer: Self-pay | Admitting: Family Medicine

## 2014-07-18 DIAGNOSIS — G43001 Migraine without aura, not intractable, with status migrainosus: Secondary | ICD-10-CM

## 2014-07-19 ENCOUNTER — Encounter: Payer: Self-pay | Admitting: Family Medicine

## 2014-07-21 ENCOUNTER — Ambulatory Visit (INDEPENDENT_AMBULATORY_CARE_PROVIDER_SITE_OTHER): Payer: BC Managed Care – PPO | Admitting: Family Medicine

## 2014-07-21 ENCOUNTER — Encounter: Payer: Self-pay | Admitting: Family Medicine

## 2014-07-21 VITALS — BP 90/70 | HR 64 | Temp 98.1°F | Resp 16 | Ht 64.0 in | Wt 131.0 lb

## 2014-07-21 DIAGNOSIS — G935 Compression of brain: Secondary | ICD-10-CM

## 2014-07-21 NOTE — Progress Notes (Signed)
Subjective:    Patient ID: Rachel [name], female    DOB: 1966/01/09, 49 y.o.   MRN: [id number]  HPI  06/23/14 Patient has had migraines for years.  She also has a history of breast cancer.  Over the last 2 months, she is having almost daily headache. The headache migrates in location. It can be on either side of her head. It can be pulsatile like her typical migraine aura can be chronic and pressure-like. She occasionally has photophobia and phonophobia. She occasionally has nausea. She is concerned that she may have metastasis to her brain from her previous breast cancer. AT that time, my plan was: I believe the patient is having uncontrolled migraines in status migrainosus. I also believe that due to the frequency of the last 2 months, she would benefit from taking a daily prophylactic medication. I will start the patient on Topamax and gradually increase her dosage to 50 mg twice daily. Recheck in one month. Patient was still like me to schedule a CT scan of her brain for her own peace of mind and I will gladly do this although I see no evidence on her physical exam.  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 Cerebellar tonsils extended 6.4 mm below the foramen magnum. Patient has no syringomyelia. She has no evidence of cord compromise or signs of increased intracranial pressure. The Topamax has decreased the frequency and severity of her headaches. Past Medical History  Diagnosis Date  . ASCUS (atypical squamous cells of undetermined significance) on Pap smear 02/2006, 05/2006    NEG HPV ----- PAP 12/2007 WNL  . Hypothyroidism   . Osteopenia 05/2012    T score -2.2 FRAX 6.5%/1.0%  . Hypercholesteremia   . 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 Chiari malformation type I    Past Surgical History  Procedure Laterality Date  . Cesarean section    . Pelvic laparoscopy    . Colposcopy    . Tonsillectomy    . Breast lumpectomy      Current Outpatient Prescriptions on File Prior to Visit  Medication Sig Dispense Refill  . butalbital-acetaminophen-caffeine (FIORICET, ESGIC) 50-325-40 MG per tablet TAKE 1 TABLET BY MOUTH EVERY 6 HOURS AS NEEDED FOR HEADACHE 20 tablet 0  . Cholecalciferol (VITAMIN D PO) Take 2,000 Units by mouth.    . fluticasone (FLONASE) 50 MCG/ACT nasal spray INHALE 2 SPRASY IN EACH NOSTRIL ONCE DAILY 16 g 11  . promethazine (PHENERGAN) 25 MG tablet TAKE 1 TABLET BY MOUTH EVERY 8 HOURS AS NEEDED FOR NAUSEA/VOMITING 20 tablet 0  . rizatriptan (MAXALT) 10 MG tablet TAKE 1 TABLET (10 MG TOTAL) BY MOUTH AS NEEDED FOR MIGRAINE. 6 tablet 5  . SYNTHROID 100 MCG tablet TAKE 1 TABLET (100 MCG TOTAL) BY MOUTH DAILY BEFORE BREAKFAST. 30 tablet 3  . thyroid (ARMOUR) 60 MG tablet 1 tablet alternating with one and a half 36 tablet 1  . topiramate (TOPAMAX) 50 MG tablet Take 1 tablet (50 mg total) by mouth 2 (two) times daily. 60 tablet 3   Current Facility-Administered Medications on File Prior to Visit  Medication Dose Route Frequency Provider Last Rate Last Dose  . promethazine (PHENERGAN) injection 25 mg  25 mg Intramuscular Once Susy Frizzle, MD       Allergies  Allergen Reactions  . Erythromycin Nausea And Vomiting   History   Social History  . Marital Status: Married    Spouse  Name: N/A  . Number of Children: N/A  . Years of Education: N/A   Occupational History  . Not on file.   Social History Main Topics  . Smoking status: Never Smoker   . Smokeless tobacco: Never Used     Comment: never used tobacco  . Alcohol Use: 0.0 oz/week    0 Standard drinks or equivalent per week  . Drug Use: No  . Sexual Activity: Yes    Birth Control/ Protection: Post-menopausal     Comment: 1st intercourse 49 yo-Fewer than 5 partners   Other Topics Concern  . Not on file   Social History Narrative    Review of Systems  All other systems reviewed and are negative.      Objective:   Physical Exam   Constitutional: She is oriented to person, place, and time. She appears well-developed and well-nourished.  Cardiovascular: Normal rate, regular rhythm, normal heart sounds and intact distal pulses.  Exam reveals no gallop and no friction rub.   No murmur heard. Pulmonary/Chest: Effort normal and breath sounds normal. No respiratory distress. She has no wheezes. She has no rales.  Abdominal: Soft. Bowel sounds are normal.  Neurological: She is alert and oriented to person, place, and time. She has normal reflexes. No cranial nerve deficit. She exhibits normal muscle tone. Coordination normal.  Vitals reviewed.         Assessment & Plan:  Chiari I malformation  I spent over 15 minutes explaining Chiari I malformation to the patient. At the present time it is asymptomatic. I recommended that we monitor her clinically. If she develops symptoms of cord impingement or signs of increased intracranial pressure, at that time I would recommend repeating an MRI. However the present time I believe her headaches are related to her migraines and they seem to be responding to Topamax. Chiari I malformation was a coincidental finding.

## 2014-08-11 DIAGNOSIS — M810 Age-related osteoporosis without current pathological fracture: Secondary | ICD-10-CM

## 2014-08-11 HISTORY — DX: Age-related osteoporosis without current pathological fracture: M81.0

## 2014-08-24 ENCOUNTER — Encounter: Payer: Self-pay | Admitting: Gynecology

## 2014-08-24 ENCOUNTER — Telehealth: Payer: Self-pay | Admitting: Gynecology

## 2014-08-24 ENCOUNTER — Ambulatory Visit (INDEPENDENT_AMBULATORY_CARE_PROVIDER_SITE_OTHER): Payer: BC Managed Care – PPO

## 2014-08-24 ENCOUNTER — Other Ambulatory Visit: Payer: Self-pay | Admitting: Gynecology

## 2014-08-24 DIAGNOSIS — M810 Age-related osteoporosis without current pathological fracture: Secondary | ICD-10-CM

## 2014-08-24 DIAGNOSIS — M8580 Other specified disorders of bone density and structure, unspecified site: Secondary | ICD-10-CM

## 2014-08-24 NOTE — Telephone Encounter (Signed)
Tell patient that her bone density shows osteoporosis. Recommend baseline PTH and 24 hour urine collection for calcium excretion. Afterwards office visit to discuss treatment options.

## 2014-08-28 NOTE — Telephone Encounter (Signed)
LEFT MESSAGE FOR PT TO CALL.

## 2014-08-31 ENCOUNTER — Other Ambulatory Visit: Payer: BC Managed Care – PPO

## 2014-08-31 DIAGNOSIS — M810 Age-related osteoporosis without current pathological fracture: Secondary | ICD-10-CM

## 2014-08-31 NOTE — Telephone Encounter (Signed)
Pt informed, pt will come today at 2:30pm for labs will schedule follow up visit with TF as well. Orders placed

## 2014-09-01 ENCOUNTER — Other Ambulatory Visit: Payer: BC Managed Care – PPO

## 2014-09-01 LAB — PTH, INTACT AND CALCIUM
CALCIUM: 9.5 mg/dL (ref 8.4–10.5)
PTH: 47 pg/mL (ref 14–64)

## 2014-09-02 LAB — CALCIUM, URINE, 24 HOUR
CALCIUM 24HR UR: 220 mg/d (ref 100–250)
Calcium, Ur: 20 mg/dL

## 2014-09-03 ENCOUNTER — Other Ambulatory Visit: Payer: Self-pay | Admitting: Endocrinology

## 2014-09-19 ENCOUNTER — Other Ambulatory Visit (HOSPITAL_BASED_OUTPATIENT_CLINIC_OR_DEPARTMENT_OTHER): Payer: BC Managed Care – PPO

## 2014-09-19 ENCOUNTER — Ambulatory Visit (HOSPITAL_BASED_OUTPATIENT_CLINIC_OR_DEPARTMENT_OTHER): Payer: BC Managed Care – PPO | Admitting: Hematology & Oncology

## 2014-09-19 ENCOUNTER — Encounter: Payer: Self-pay | Admitting: Hematology & Oncology

## 2014-09-19 VITALS — BP 115/56 | HR 70 | Temp 97.9°F | Resp 16

## 2014-09-19 DIAGNOSIS — Z853 Personal history of malignant neoplasm of breast: Secondary | ICD-10-CM

## 2014-09-19 DIAGNOSIS — M8580 Other specified disorders of bone density and structure, unspecified site: Secondary | ICD-10-CM

## 2014-09-19 DIAGNOSIS — E785 Hyperlipidemia, unspecified: Secondary | ICD-10-CM

## 2014-09-19 DIAGNOSIS — E032 Hypothyroidism due to medicaments and other exogenous substances: Secondary | ICD-10-CM

## 2014-09-19 DIAGNOSIS — C50911 Malignant neoplasm of unspecified site of right female breast: Secondary | ICD-10-CM

## 2014-09-19 DIAGNOSIS — E039 Hypothyroidism, unspecified: Secondary | ICD-10-CM

## 2014-09-19 DIAGNOSIS — E559 Vitamin D deficiency, unspecified: Secondary | ICD-10-CM

## 2014-09-19 LAB — CMP (CANCER CENTER ONLY)
ALT(SGPT): 22 U/L (ref 10–47)
AST: 24 U/L (ref 11–38)
Albumin: 4.1 g/dL (ref 3.3–5.5)
Alkaline Phosphatase: 68 U/L (ref 26–84)
BUN: 19 mg/dL (ref 7–22)
CO2: 29 mEq/L (ref 18–33)
Calcium: 9.9 mg/dL (ref 8.0–10.3)
Chloride: 109 mEq/L — ABNORMAL HIGH (ref 98–108)
Creat: 0.9 mg/dl (ref 0.6–1.2)
GLUCOSE: 96 mg/dL (ref 73–118)
Potassium: 4.5 mEq/L (ref 3.3–4.7)
Sodium: 146 mEq/L — ABNORMAL HIGH (ref 128–145)
Total Bilirubin: 0.8 mg/dl (ref 0.20–1.60)
Total Protein: 7.3 g/dL (ref 6.4–8.1)

## 2014-09-19 LAB — CBC WITH DIFFERENTIAL (CANCER CENTER ONLY)
BASO#: 0 10*3/uL (ref 0.0–0.2)
BASO%: 0.4 % (ref 0.0–2.0)
EOS%: 2.7 % (ref 0.0–7.0)
Eosinophils Absolute: 0.1 10*3/uL (ref 0.0–0.5)
HCT: 41.6 % (ref 34.8–46.6)
HEMOGLOBIN: 14.3 g/dL (ref 11.6–15.9)
LYMPH#: 2 10*3/uL (ref 0.9–3.3)
LYMPH%: 41.4 % (ref 14.0–48.0)
MCH: 31 pg (ref 26.0–34.0)
MCHC: 34.4 g/dL (ref 32.0–36.0)
MCV: 90 fL (ref 81–101)
MONO#: 0.3 10*3/uL (ref 0.1–0.9)
MONO%: 5.9 % (ref 0.0–13.0)
NEUT%: 49.6 % (ref 39.6–80.0)
NEUTROS ABS: 2.4 10*3/uL (ref 1.5–6.5)
PLATELETS: 237 10*3/uL (ref 145–400)
RBC: 4.61 10*6/uL (ref 3.70–5.32)
RDW: 12.3 % (ref 11.1–15.7)
WBC: 4.8 10*3/uL (ref 3.9–10.0)

## 2014-09-19 LAB — LIPID PANEL
Cholesterol: 229 mg/dL — ABNORMAL HIGH (ref 125–200)
HDL: 50 mg/dL (ref >=46)
LDL Cholesterol: 154 mg/dL — ABNORMAL HIGH (ref <130)
Total CHOL/HDL Ratio: 4.6 Ratio (ref <=5.0)
Triglycerides: 127 mg/dL (ref <150)
VLDL: 25 mg/dL (ref <30)

## 2014-09-19 LAB — VITAMIN D 25 HYDROXY (VIT D DEFICIENCY, FRACTURES): VIT D 25 HYDROXY: 39 ng/mL (ref 30–100)

## 2014-09-19 LAB — TSH CHCC: TSH: 0.376 m[IU]/L (ref 0.308–3.960)

## 2014-09-20 ENCOUNTER — Telehealth: Payer: Self-pay | Admitting: *Deleted

## 2014-09-20 NOTE — Telephone Encounter (Addendum)
Patient aware of results. She states not to call her PCP as they have already told her about the cholesterol and she is not willing to take anything at this time.   ----- Message from Volanda Napoleon, MD sent at 09/20/2014  6:30 AM EDT ----- Call - vit D is good. Cholesterol is not!!!  Keep on baby aspirin. You probably need an actual medication to bring the cholesterol down!!!  Please fax this result to her family MD!!  Thanks!!  pete

## 2014-09-20 NOTE — Progress Notes (Signed)
Hematology and Oncology Follow Up Visit  Rachel [name] [id number] Dec 04, 1965 49 y.o. 09/20/2014   Principle Diagnosis:  Stage I (T1 N0M0) infiltrating ductal carcinoma the right breast  Current Therapy:    Observation     Interim History:  [name] is back for followup. She is doing quite well. 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  She and her husband went on a [place] vacation.hey really had a great time. She had no issues with fatigue. She pretty much did what she wished.  Her youngest son is now out of college. He is working in [place]. This is exciting for her.  Her oldest son is on the police force. She worries about him because very thing going on in the world right now.  She has has issues  with cholesterol . We check her cholesterol or time we see her. She has not wished to take cholesterol medications. There's been no leg swelling. She's had no rashes.  She's had no issues with her appetite. There's been no cough. She's had no visual problems.   She does see a endocrinologist for her thyroid.    Medications:  Current outpatient prescriptions:  .  ARMOUR THYROID 60 MG tablet, TAKE 1 TABLET ALTERNATING WITH ONE AND A HALF, Disp: 36 tablet, Rfl: 3 .  butalbital-acetaminophen-caffeine (FIORICET, ESGIC) 50-325-40 MG per tablet, TAKE 1 TABLET BY MOUTH EVERY 6 HOURS AS NEEDED FOR HEADACHE, Disp: 20 tablet, Rfl: 0 .  Cholecalciferol (VITAMIN D PO), Take 2,000 Units by mouth., Disp: , Rfl:  .  fluticasone (FLONASE) 50 MCG/ACT nasal spray, INHALE 2 SPRASY IN EACH NOSTRIL ONCE DAILY, Disp: 16 g, Rfl: 11 .  penicillin v potassium (VEETID) 500 MG tablet, , Disp: , Rfl:  .  promethazine (PHENERGAN) 25 MG tablet, TAKE 1 TABLET BY MOUTH EVERY 8 HOURS AS NEEDED FOR NAUSEA/VOMITING, Disp: 20 tablet, Rfl: 0 .  rizatriptan (MAXALT) 10 MG tablet, TAKE 1 TABLET (10 MG TOTAL) BY MOUTH AS NEEDED FOR MIGRAINE., Disp: 6 tablet, Rfl: 5 .  SYNTHROID 100 MCG tablet,  TAKE 1 TABLET (100 MCG TOTAL) BY MOUTH DAILY BEFORE BREAKFAST., Disp: 30 tablet, Rfl: 3 .  triazolam (HALCION) 0.125 MG tablet, , Disp: , Rfl:  .  topiramate (TOPAMAX) 50 MG tablet, Take 1 tablet (50 mg total) by mouth 2 (two) times daily. (Patient not taking: Reported on 09/19/2014), Disp: 60 tablet, Rfl: 3  Current facility-administered medications:  .  promethazine (PHENERGAN) injection 25 mg, 25 mg, Intramuscular, Once, Susy Frizzle, MD  Allergies:  Allergies  Allergen Reactions  . Erythromycin Nausea And Vomiting    Past Medical History, Surgical history, Social history, and Family History were reviewed and updated.  Review of Systems: As above  Physical Exam:  oral temperature is 97.9 F (36.6 C). Her blood pressure is 115/56 and her pulse is 70. Her respiration is 16.   Well-developed and well-nourished white female. Head and neck exam shows no ocular or oral lesions. She has no palpable cervical or supraclavicular lymph nodes. Lungs are clear bilaterally. Cardiac exam regular rate and rhythm with no murmurs rubs or bruits. Abdomen is soft. Has good bowel sounds. There is no fluid wave. There is no palpable liver or spleen tip. Breast exam shows left breast no masses edema or erythema. There is no left axillary adenopathy. Right breast is slightly contracted from surgery and radiation. She has a well-healed lumpectomy at the 10:00 position. No  distinct mass is noted in the right breast. There is no right axillary adenopathy. Back exam shows no tenderness over the spine ribs or hips. Extremities shows no clubbing cyanosis or edema. Skin exam no rashes, ecchymosis or petechia. Neurological exam is nonfocal.  Lab Results  Component Value Date   WBC 4.8 09/19/2014   HGB 14.3 09/19/2014   HCT 41.6 09/19/2014   MCV 90 09/19/2014   PLT 237 09/19/2014     Chemistry      Component Value Date/Time   NA 146* 09/19/2014 1017   NA 141 03/29/2014 1519   NA 146* 05/02/2013 0826   K  4.5 09/19/2014 1017   K 4.4 03/29/2014 1519   K 4.0 05/02/2013 0826   CL 109* 09/19/2014 1017   CL 102 03/29/2014 1519   CO2 29 09/19/2014 1017   CO2 27 03/29/2014 1519   CO2 27 05/02/2013 0826   BUN 19 09/19/2014 1017   BUN 15 03/29/2014 1519   BUN 16.1 05/02/2013 0826   CREATININE 0.9 09/19/2014 1017   CREATININE 0.73 03/29/2014 1519   CREATININE 0.8 05/02/2013 0826      Component Value Date/Time   CALCIUM 9.9 09/19/2014 1017   CALCIUM 9.5 08/31/2014 1442   CALCIUM 9.7 05/02/2013 0826   ALKPHOS 68 09/19/2014 1017   ALKPHOS 63 03/29/2014 1519   ALKPHOS 77 05/02/2013 0826   AST 24 09/19/2014 1017   AST 18 03/29/2014 1519   AST 18 05/02/2013 0826   ALT 22 09/19/2014 1017   ALT 16 03/29/2014 1519   ALT 20 05/02/2013 0826   BILITOT 0.80 09/19/2014 1017   BILITOT 0.3 03/29/2014 1519   BILITOT 0.27 05/02/2013 0826     TSH is 0.376.   Her cholesterol is 229. Her LDL is 154. Her cholesterol/HDL ratio is 4.6. Her vitamin D is 39.    Impression and Plan: Rachel [name] is a 49 year old white female with a history of stage I ductal carcinoma of the right breast. She was diagnosed 15 years ago. 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 I would that she is cured.  I think that her main problem probably will be cholesterol. She does not want take cholesterol medicine. She has had elevated lipids. We will see what her values are today.  She does have her mammograms routinely.  I really need to talk to her about getting genetic testing. I don't think she's had this yet. It certainly would be reasonable to do this.  I'll see her back in 6 months.   Volanda Napoleon, MD 8/10/20167:34 AM

## 2014-10-31 ENCOUNTER — Other Ambulatory Visit: Payer: BC Managed Care – PPO

## 2014-11-03 ENCOUNTER — Ambulatory Visit: Payer: BC Managed Care – PPO | Admitting: Endocrinology

## 2014-11-21 ENCOUNTER — Other Ambulatory Visit: Payer: BC Managed Care – PPO

## 2014-11-21 ENCOUNTER — Other Ambulatory Visit (INDEPENDENT_AMBULATORY_CARE_PROVIDER_SITE_OTHER): Payer: BC Managed Care – PPO

## 2014-11-21 DIAGNOSIS — E063 Autoimmune thyroiditis: Secondary | ICD-10-CM

## 2014-11-21 DIAGNOSIS — E038 Other specified hypothyroidism: Secondary | ICD-10-CM

## 2014-11-21 LAB — T4, FREE: FREE T4: 0.57 ng/dL — AB (ref 0.60–1.60)

## 2014-11-21 LAB — TSH: TSH: 0.42 u[IU]/mL (ref 0.35–4.50)

## 2014-11-23 ENCOUNTER — Encounter: Payer: Self-pay | Admitting: Endocrinology

## 2014-11-23 ENCOUNTER — Ambulatory Visit (INDEPENDENT_AMBULATORY_CARE_PROVIDER_SITE_OTHER): Payer: BC Managed Care – PPO | Admitting: Endocrinology

## 2014-11-23 VITALS — BP 118/74 | HR 93 | Temp 98.3°F | Resp 14 | Ht 64.0 in | Wt 137.8 lb

## 2014-11-23 DIAGNOSIS — E038 Other specified hypothyroidism: Secondary | ICD-10-CM

## 2014-11-23 DIAGNOSIS — E063 Autoimmune thyroiditis: Secondary | ICD-10-CM

## 2014-11-23 MED ORDER — LEVOTHYROXINE SODIUM 100 MCG PO TABS: 100.0000 ug | ORAL_TABLET | Freq: Every day | ORAL | Status: DC

## 2014-11-23 NOTE — Progress Notes (Signed)
Patient ID: Rachel [name], female   DOB: Jan 12, 1966, 49 y.o.   MRN: [id number]   Reason for Appointment:  Hypothyroidism, followup visit   History of Present Illness:   HYPOTHYROIDISM was first diagnosed in 1992  With a TSH of 51  Her thyroxine dosage has been typically difficult to regulate and requires fairly frequent adjustments of dosage despite her being compliant with brand name Synthroid. Over the years she has taken a wide range of doses for her supplement  In 02/2014 her TSH was  low at 0.05 and her dose was reduced to 100 g daily Subsequently her TSH was normal On her last visit she was complaining on her last visit about feeling significantly tired and more cold intolerant unusual She would wake up feeling tired and is also fairly tired at the end of the day.  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  TSH now is again normal  Wt Readings from Last 3 Encounters:  11/23/14 137 lb 12.8 oz (62.506 kg)  07/21/14 131 lb (59.421 kg)  06/30/14 135 lb (61.236 kg)    Lab results:  Lab Results  Component Value Date   FREET4 0.57* 11/21/2014   FREET4 1.11 06/27/2014   FREET4 1.56 02/13/2014   TSH 0.42 11/21/2014   TSH 0.376 09/19/2014   TSH 0.46 06/27/2014      Appointment on 11/21/2014  Component Date Value Ref Range Status  . TSH 11/21/2014 0.42  0.35 - 4.50 uIU/mL Final  . Free T4 11/21/2014 0.57* 0.60 - 1.60 ng/dL Final       Medication List       This list is accurate as of: 11/23/14  9:05 PM.  Always use your most recent med list.               ARMOUR THYROID 60 MG tablet  Generic drug:  thyroid  TAKE 1 TABLET ALTERNATING WITH ONE AND A HALF     butalbital-acetaminophen-caffeine 50-325-40 MG tablet  Commonly known as:  FIORICET, ESGIC  TAKE 1 TABLET BY MOUTH EVERY 6 HOURS AS NEEDED FOR HEADACHE     fluticasone 50 MCG/ACT nasal spray  Commonly known as:  FLONASE  INHALE 2 SPRASY IN EACH NOSTRIL ONCE DAILY     levothyroxine 100 MCG tablet  Commonly known as:  SYNTHROID, LEVOTHROID  Take 1 tablet (100 mcg total) by mouth daily before breakfast.     penicillin v potassium 500 MG tablet  Commonly known as:  VEETID     promethazine 25 MG tablet  Commonly known as:  PHENERGAN  TAKE 1 TABLET BY MOUTH EVERY 8 HOURS AS NEEDED FOR NAUSEA/VOMITING     rizatriptan 10 MG tablet  Commonly known as:  MAXALT  TAKE 1 TABLET (10 MG TOTAL) BY MOUTH AS NEEDED FOR MIGRAINE.     triazolam 0.125 MG tablet  Commonly known  as:  HALCION     VITAMIN D PO  Take 2,000 Units by mouth.         Past Medical History  Diagnosis Date  . ASCUS (atypical squamous cells of undetermined significance) on Pap smear 02/2006, 05/2006    NEG HPV ----- PAP 12/2007 WNL  . Hypothyroidism   . Osteoporosis 08/2014     T score -2.5  . Hypercholesteremia   . 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 Cesarean section    . Pelvic laparoscopy    . Colposcopy    . Tonsillectomy    . Breast lumpectomy      Family History  Problem Relation Age of Onset  . Diabetes Father     Social History:  reports that she has never smoked. She has never used smokeless tobacco. She reports that she drinks alcohol. She reports that she does not use illicit drugs.  Allergies:  Allergies   Allergen Reactions  . Erythromycin Nausea And Vomiting   Review of systems:  She has a history of breast cancer Has occasional hot flushes  History of osteopenia present  Hypercholesterolemia: Previous LDL 150, has no other risk factors   Examination:   BP 118/74 mmHg  Pulse 93  Temp(Src) 98.3 F (36.8 C)  Resp 14  Ht 5\' 4"  (1.626 m)  Wt 137 lb 12.8 oz (62.506 kg)  BMI 23.64 kg/m2  SpO2 94%  LMP 04/24/2000  She looks well   Biceps reflexes show normal relaxation Thyroid not palpable Repeat pulse 80, regular Skin appears normal.  No tremor        Assessment    Hypothyroidism, long-standing with history of variability and TSH With using Armour Thyroid 90 mg alternating with 60 mg she may be feeling a little better but she is not sure about this She has not been seen for 5 months since her dosage change However her TSH is normal  Today however her pulse rate is relatively fast.  She normally has a pulse in the 60-70 range T3 level not available   Plan:  Since she is not convinced that her fatigue was improved with switching to Armour Thyroid and she appears to have mild sinus tachycardia with this will switch her back to brand name Levoxyl  She does need to have a follow-up TSH in 6 weeks and she will let us know if she has any unusual fatigue   Ciela Mahajan 11/23/2014, 9:05 PM

## 2014-11-23 NOTE — Patient Instructions (Signed)
Go back to Synthroid 100ug

## 2015-01-09 ENCOUNTER — Other Ambulatory Visit (INDEPENDENT_AMBULATORY_CARE_PROVIDER_SITE_OTHER): Payer: BC Managed Care – PPO

## 2015-01-09 DIAGNOSIS — E063 Autoimmune thyroiditis: Secondary | ICD-10-CM

## 2015-01-09 DIAGNOSIS — E038 Other specified hypothyroidism: Secondary | ICD-10-CM | POA: Diagnosis not present

## 2015-01-09 LAB — TSH: TSH: 0.99 u[IU]/mL (ref 0.35–4.50)

## 2015-01-21 ENCOUNTER — Other Ambulatory Visit: Payer: Self-pay | Admitting: Family Medicine

## 2015-03-20 ENCOUNTER — Encounter: Payer: Self-pay | Admitting: Family Medicine

## 2015-03-20 ENCOUNTER — Ambulatory Visit (INDEPENDENT_AMBULATORY_CARE_PROVIDER_SITE_OTHER): Payer: BC Managed Care – PPO | Admitting: Family Medicine

## 2015-03-20 VITALS — BP 114/74 | HR 68 | Temp 98.4°F | Resp 18 | Wt 138.0 lb

## 2015-03-20 DIAGNOSIS — J019 Acute sinusitis, unspecified: Secondary | ICD-10-CM | POA: Diagnosis not present

## 2015-03-20 MED ORDER — AMOXICILLIN-POT CLAVULANATE 875-125 MG PO TABS: 1.0000 | ORAL_TABLET | Freq: Two times a day (BID) | ORAL | Status: DC

## 2015-03-20 NOTE — Progress Notes (Signed)
Subjective:    Patient ID: Rachel [name], female    DOB: January 04, 1966, 50 y.o.   MRN: [id number]  HPI   Patient is a very pleasant 50 year old white female who presents with 2 weeks of maxillary and frontal sinus tenderness to palpation, severe headaches, and bloody purulent nasal discharge. She is requesting treatment for sinus infection.  Has tried claritin d, sudafed, and flonase without relief. Past Medical History  Diagnosis Date  . ASCUS (atypical squamous cells of undetermined significance) on Pap smear 02/2006, 05/2006    NEG HPV ----- PAP 12/2007 WNL  . Hypothyroidism   . Osteoporosis 08/2014     T score -2.5  . Hypercholesteremia   . 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 Cesarean section    . Pelvic laparoscopy    . Colposcopy    . Tonsillectomy    . Breast lumpectomy     Current Outpatient Prescriptions on File Prior to Visit  Medication Sig Dispense Refill  . butalbital-acetaminophen-caffeine (FIORICET, ESGIC) 50-325-40 MG per tablet TAKE 1 TABLET BY MOUTH EVERY 6 HOURS AS NEEDED FOR HEADACHE 20 tablet 0  . Cholecalciferol (VITAMIN D PO) Take 2,000 Units by mouth.    . fluticasone (FLONASE) 50 MCG/ACT nasal spray INHALE 2 SPRASY IN EACH NOSTRIL ONCE DAILY 16 g 11  . levothyroxine (SYNTHROID, LEVOTHROID) 100 MCG tablet Take 1 tablet (100 mcg total) by mouth daily before breakfast. 30 tablet 3  . promethazine (PHENERGAN) 25 MG tablet TAKE 1 TABLET BY MOUTH EVERY 8 HOURS AS NEEDED FOR NAUSEA/VOMITING 20 tablet 0  . rizatriptan (MAXALT) 10 MG tablet TAKE 1 TABLET BY MOUTH EVERY DAY AS NEEDED FOR MIGRANE 6 tablet 4   No current facility-administered medications on file prior to visit.   Allergies  Allergen Reactions  . Erythromycin Nausea And Vomiting   Social History   Social History  . Marital Status: Married    Spouse Name:  N/A  . Number of Children: N/A  . Years of Education: N/A   Occupational History  . Not on file.   Social History Main Topics  . Smoking status: Never Smoker   . Smokeless tobacco: Never Used     Comment: never used tobacco  . Alcohol Use: 0.0 oz/week    0 Standard drinks or equivalent per week  . Drug Use: No  . Sexual Activity: Yes    Birth Control/ Protection: Post-menopausal     Comment: 1st intercourse 50 yo-Fewer than 5 partners   Other Topics Concern  . Not on file   Social History Narrative     Review of Systems  All other systems reviewed and are negative.      Objective:   Physical Exam  HENT:  Right Ear: External ear normal.  Left Ear: External ear normal.  Nose: Mucosal edema and rhinorrhea present. Right sinus exhibits maxillary sinus tenderness and frontal sinus tenderness. Left sinus exhibits maxillary sinus tenderness and frontal sinus tenderness.  Mouth/Throat: Oropharynx is clear and moist. No oropharyngeal exudate.  Eyes: Conjunctivae are normal. Pupils are equal, round, and reactive to light.  Neck: Neck supple.  Cardiovascular: Normal rate, regular rhythm and normal heart sounds.   No murmur heard. Pulmonary/Chest: Effort normal and breath sounds normal.  Lymphadenopathy:    She has no cervical adenopathy.  Vitals reviewed.  Assessment & Plan:  Acute rhinosinusitis - Plan: amoxicillin-clavulanate (AUGMENTIN) 875-125 MG tablet  I will treat the patient with augmentin 875 mg  by mouth twice a day for 10 days.

## 2015-03-22 ENCOUNTER — Other Ambulatory Visit: Payer: BC Managed Care – PPO

## 2015-03-22 ENCOUNTER — Ambulatory Visit: Payer: BC Managed Care – PPO | Admitting: Hematology & Oncology

## 2015-03-23 ENCOUNTER — Other Ambulatory Visit (HOSPITAL_BASED_OUTPATIENT_CLINIC_OR_DEPARTMENT_OTHER): Payer: BC Managed Care – PPO

## 2015-03-23 ENCOUNTER — Encounter: Payer: Self-pay | Admitting: Hematology & Oncology

## 2015-03-23 ENCOUNTER — Ambulatory Visit (HOSPITAL_BASED_OUTPATIENT_CLINIC_OR_DEPARTMENT_OTHER): Payer: BC Managed Care – PPO | Admitting: Hematology & Oncology

## 2015-03-23 VITALS — BP 134/62 | HR 65 | Temp 97.4°F | Resp 16 | Ht 64.0 in | Wt 142.0 lb

## 2015-03-23 DIAGNOSIS — E032 Hypothyroidism due to medicaments and other exogenous substances: Secondary | ICD-10-CM | POA: Diagnosis not present

## 2015-03-23 DIAGNOSIS — R1013 Epigastric pain: Secondary | ICD-10-CM

## 2015-03-23 DIAGNOSIS — R1012 Left upper quadrant pain: Secondary | ICD-10-CM

## 2015-03-23 DIAGNOSIS — C50911 Malignant neoplasm of unspecified site of right female breast: Secondary | ICD-10-CM | POA: Diagnosis not present

## 2015-03-23 DIAGNOSIS — E559 Vitamin D deficiency, unspecified: Secondary | ICD-10-CM

## 2015-03-23 DIAGNOSIS — C50012 Malignant neoplasm of nipple and areola, left female breast: Secondary | ICD-10-CM

## 2015-03-23 DIAGNOSIS — Z170 Estrogen receptor positive status [ER+]: Secondary | ICD-10-CM

## 2015-03-23 LAB — CBC WITH DIFFERENTIAL (CANCER CENTER ONLY)
BASO#: 0 10*3/uL (ref 0.0–0.2)
BASO%: 0.4 % (ref 0.0–2.0)
EOS%: 2.5 % (ref 0.0–7.0)
Eosinophils Absolute: 0.1 10*3/uL (ref 0.0–0.5)
HCT: 39.9 % (ref 34.8–46.6)
HGB: 13.4 g/dL (ref 11.6–15.9)
LYMPH#: 2.6 10*3/uL (ref 0.9–3.3)
LYMPH%: 45.7 % (ref 14.0–48.0)
MCH: 30 pg (ref 26.0–34.0)
MCHC: 33.6 g/dL (ref 32.0–36.0)
MCV: 90 fL (ref 81–101)
MONO#: 0.4 10*3/uL (ref 0.1–0.9)
MONO%: 6.7 % (ref 0.0–13.0)
NEUT#: 2.6 10*3/uL (ref 1.5–6.5)
NEUT%: 44.7 % (ref 39.6–80.0)
Platelets: 212 10*3/uL (ref 145–400)
RBC: 4.46 10*6/uL (ref 3.70–5.32)
RDW: 12.6 % (ref 11.1–15.7)
WBC: 5.7 10*3/uL (ref 3.9–10.0)

## 2015-03-23 LAB — COMPREHENSIVE METABOLIC PANEL
ALT: 28 U/L (ref 0–55)
AST: 24 U/L (ref 5–34)
Albumin: 4 g/dL (ref 3.5–5.0)
Alkaline Phosphatase: 82 U/L (ref 40–150)
Anion Gap: 11 mEq/L (ref 3–11)
BILIRUBIN TOTAL: 0.34 mg/dL (ref 0.20–1.20)
BUN: 18.8 mg/dL (ref 7.0–26.0)
CHLORIDE: 103 meq/L (ref 98–109)
CO2: 26 meq/L (ref 22–29)
CREATININE: 0.8 mg/dL (ref 0.6–1.1)
Calcium: 9.4 mg/dL (ref 8.4–10.4)
EGFR: 88 mL/min/{1.73_m2} — ABNORMAL LOW (ref >90)
GLUCOSE: 128 mg/dL (ref 70–140)
Potassium: 3.6 mEq/L (ref 3.5–5.1)
SODIUM: 140 meq/L (ref 136–145)
TOTAL PROTEIN: 6.9 g/dL (ref 6.4–8.3)

## 2015-03-23 LAB — TSH: TSH: 0.502 m[IU]/L (ref 0.308–3.960)

## 2015-03-23 LAB — LIPID PANEL
CHOL/HDL RATIO: 4.5 ratio — AB (ref 0.0–4.4)
Cholesterol, Total: 208 mg/dL — ABNORMAL HIGH (ref 100–199)
HDL: 46 mg/dL (ref >39)
LDL Calculated: 113 mg/dL — ABNORMAL HIGH (ref 0–99)
Triglycerides: 247 mg/dL — ABNORMAL HIGH (ref 0–149)
VLDL Cholesterol Cal: 49 mg/dL — ABNORMAL HIGH (ref 5–40)

## 2015-03-23 NOTE — Progress Notes (Signed)
Hematology and Oncology Follow Up Visit  Rachel [name] [facility] [id number] Jul 11, 1965 50 y.o. 03/23/2015   Principle Diagnosis:  Stage I (T1 N0M0) infiltrating ductal carcinoma the right breast  Current Therapy:    Observation     Interim History:  Rachel [name] is back for followup. She is doing quite well. 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  Her younger son is now engaged. He will be getting married in August. Unfortunate, no plans have been made yet as aware the wedding will be.  She feels well. She noted a little nodule behind her left ear. This is not painful. I looked at it. It might be a little cyst. I don't think is anything to worry about with respect to her breast cancer. I told her to try some steroid cream.   She's had no cough. She's had no change in bowel or bladder habits.  Her mammogram is due in April.  We are drawn blood work for her other health problems. It is just easiest for [place] to do this and then have the results go out to her specialist.  Overall, her performance status is ECOG 0.   Medications:  Current outpatient prescriptions:  .  amoxicillin-clavulanate (AUGMENTIN) 875-125 MG tablet, Take 1 tablet by mouth 2 (two) times daily., Disp: 20 tablet, Rfl: 0 .  butalbital-acetaminophen-caffeine (FIORICET, ESGIC) 50-325-40 MG per tablet, TAKE 1 TABLET BY MOUTH EVERY 6 HOURS AS NEEDED FOR HEADACHE, Disp: 20 tablet, Rfl: 0 .  Cholecalciferol (VITAMIN D PO), Take 2,000 Units by mouth., Disp: , Rfl:  .  fluticasone (FLONASE) 50 MCG/ACT nasal spray, INHALE 2 SPRASY IN EACH NOSTRIL ONCE DAILY, Disp: 16 g, Rfl: 11 .  levothyroxine (SYNTHROID, LEVOTHROID) 100 MCG tablet, Take 1 tablet (100 mcg total) by mouth daily before breakfast., Disp: 30 tablet, Rfl: 3 .  promethazine (PHENERGAN) 25 MG tablet, TAKE 1 TABLET BY MOUTH EVERY 8 HOURS AS NEEDED FOR NAUSEA/VOMITING, Disp: 20 tablet, Rfl: 0 .  rizatriptan (MAXALT) 10 MG tablet, TAKE 1 TABLET BY MOUTH  EVERY DAY AS NEEDED FOR MIGRANE, Disp: 6 tablet, Rfl: 4  Allergies:  Allergies  Allergen Reactions  . Erythromycin Nausea And Vomiting    Past Medical History, Surgical history, Social history, and Family History were reviewed and updated.  Review of Systems: As above  Physical Exam:  height is 5\' 4"  (1.626 m) and weight is 142 lb (64.411 kg). Her oral temperature is 97.4 F (36.3 C). Her blood pressure is 134/62 and her pulse is 65. Her respiration is 16.   Well-developed and well-nourished white female. Head and neck exam shows no ocular or oral lesions. She has no palpable cervical or supraclavicular lymph nodes. Lungs are clear bilaterally. Cardiac exam regular rate and rhythm with no murmurs rubs or bruits. Abdomen is soft. Has good bowel sounds. There is no fluid wave. There is no palpable liver or spleen tip. Breast exam shows left breast no masses edema or erythema. There is no left axillary adenopathy. Right breast is slightly contracted from surgery and radiation. She has a well-healed lumpectomy at the 10:00 position. No distinct mass is noted in the right breast. There is no right axillary adenopathy. Back exam shows no tenderness over the spine ribs or hips. Extremities shows no clubbing cyanosis or edema. Skin exam no rashes, ecchymosis or petechia. Neurological exam is nonfocal.  Lab Results  Component Value Date   WBC 5.7 03/23/2015   HGB  13.4 03/23/2015   HCT 39.9 03/23/2015   MCV 90 03/23/2015   PLT 212 03/23/2015     Chemistry      Component Value Date/Time   NA 140 03/23/2015 1215   NA 146* 09/19/2014 1017   NA 141 03/29/2014 1519   K 3.6 03/23/2015 1215   K 4.5 09/19/2014 1017   K 4.4 03/29/2014 1519   CL 109* 09/19/2014 1017   CL 102 03/29/2014 1519   CO2 26 03/23/2015 1215   CO2 29 09/19/2014 1017   CO2 27 03/29/2014 1519   BUN 18.8 03/23/2015 1215   BUN 19 09/19/2014 1017   BUN 15 03/29/2014 1519   CREATININE 0.8 03/23/2015 1215   CREATININE 0.9  09/19/2014 1017   CREATININE 0.73 03/29/2014 1519      Component Value Date/Time   CALCIUM 9.4 03/23/2015 1215   CALCIUM 9.9 09/19/2014 1017   CALCIUM 9.5 08/31/2014 1442   ALKPHOS 82 03/23/2015 1215   ALKPHOS 68 09/19/2014 1017   ALKPHOS 63 03/29/2014 1519   AST 24 03/23/2015 1215   AST 24 09/19/2014 1017   AST 18 03/29/2014 1519   ALT 28 03/23/2015 1215   ALT 22 09/19/2014 1017   ALT 16 03/29/2014 1519   BILITOT 0.34 03/23/2015 1215   BILITOT 0.80 09/19/2014 1017   BILITOT 0.3 03/29/2014 1519        Impression and Plan: Rachel [name] is a 50 year old white female with a history of stage I ductal carcinoma of the right breast. She was diagnosed 15 years ago. 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 I would that she is cured.  I talked to her at length about doing a genetic analysis. She had her breast cancer when she was 50 years old. I think that it would help her but also would help her 2 boys.  She agrees to have this done. She wants to have this done when she comes in for her next visit in 6 months.  We will see what her labs show. We will see what her cholesterol level is. Hopefully, she will exercise little bit more and lose a little bit overweight.   I'll see her back in 6 months.   Volanda Napoleon, MD 2/10/20171:38 PM

## 2015-03-24 LAB — VITAMIN D 25 HYDROXY (VIT D DEFICIENCY, FRACTURES): Vitamin D, 25-Hydroxy: 37.6 ng/mL (ref 30.0–100.0)

## 2015-03-26 ENCOUNTER — Encounter: Payer: Self-pay | Admitting: Hematology & Oncology

## 2015-03-26 ENCOUNTER — Telehealth: Payer: Self-pay | Admitting: *Deleted

## 2015-03-26 NOTE — Telephone Encounter (Addendum)
Patient aware of results. Will follow up with PCP. Records faxed.  ----- Message from Volanda Napoleon, MD sent at 03/26/2015  6:45 AM EST ----- Call - vit D is ok. Cholesterol is NOT ok!!  You really need to be on something for the cholesterol issue. We can call in special omega 3 fatty acid presription if you like!  Laurey Arrow  Fax this to her family MD  Laurey Arrow

## 2015-03-29 ENCOUNTER — Ambulatory Visit (INDEPENDENT_AMBULATORY_CARE_PROVIDER_SITE_OTHER): Payer: BC Managed Care – PPO | Admitting: Gynecology

## 2015-03-29 ENCOUNTER — Encounter: Payer: Self-pay | Admitting: Gynecology

## 2015-03-29 VITALS — BP 120/74 | Ht 64.0 in | Wt 139.0 lb

## 2015-03-29 DIAGNOSIS — M810 Age-related osteoporosis without current pathological fracture: Secondary | ICD-10-CM

## 2015-03-29 DIAGNOSIS — Z01419 Encounter for gynecological examination (general) (routine) without abnormal findings: Secondary | ICD-10-CM

## 2015-03-29 DIAGNOSIS — C50911 Malignant neoplasm of unspecified site of right female breast: Secondary | ICD-10-CM

## 2015-03-29 NOTE — Progress Notes (Signed)
Mauria Shontz [facility] August 10, 1965 [id number]        50 y.o.  G2P2002  for annual exam.  Several issues below.  Past medical history,surgical history, problem list, medications, allergies, family history and social history were all reviewed and documented as reviewed in the EPIC chart.  ROS:  Performed with pertinent positives and negatives included in the history, assessment and plan.   Additional significant findings :  none   Exam: Caryn Bee assistant Filed Vitals:   03/29/15 1558  BP: 120/74  Height: 5\' 4"  (1.626 m)  Weight: 139 lb (63.05 kg)   General appearance:  Normal affect, orientation and appearance. Skin: Grossly normal HEENT: Without gross lesions.  No cervical or supraclavicular adenopathy. Thyroid normal.  Lungs:  Clear without wheezing, rales or rhonchi Cardiac: RR, without RMG Abdominal:  Soft, nontender, without masses, guarding, rebound, organomegaly or hernia Breasts:  Examined lying and sitting without masses, retractions, discharge or axillary adenopathy.  Well-healed right lumpectomy scar Pelvic:  Ext/BUS/vagina normal  Cervix normal  Uterus anteverted, normal size, shape and contour, midline and mobile nontender   Adnexa without masses or tenderness    Anus and perineum normal   Rectovaginal normal sphincter tone without palpated masses or tenderness.    Assessment/Plan:  50 y.o. [id number] female for annual exam.   1. Postmenopausal. Remains without menses. [place] last checked at 104. No significant hot flashes, night sweats, vaginal dryness or any vaginal bleeding. The monitor report any issues include vaginal bleeding. 2. Breast cancer, right 2001. Exam NED. Mammography 06/2013. She missed her last year's mammogram and I reminded her to schedule this and she agrees to do so. SBE monthly reviewed. 3. Osteoporosis.  DEXA 08/2014 T score -2.5. Statistically significant decline from her prior DEXA 2 years before at the spine and right hip.  When compared to her initial DEXA  it is stable. We did a workup for secondary causes July 2016 with lower end of normal range with TSH, normal PTH, normal vitamin D, normal comprehensive metabolic panel, 123456 urine calcium excretion less than 250 mg. Had taken bisphosphonates for approximately 4 years off of them now by several years. Options to reinitiate treatment now or to wait and restudy her at a 2 year interval reviewed. Risks of continued bone loss discussed. She is being run a little hyper on her thyroid replacement where her TSH is at the lowest limits of normal. At this point the patient feels comfortable waiting and restudy in a year and a half given that she has been stable from the early on bone densities and has shown loss since her last study.  Will plan repeating her bone density 08/2016. 4. Pap smear/HPV normal 03/2013. No Pap smear done today. No history of abnormal Pap smears.  Plan repeat Pap smear 3-5 year interval. 5. Health maintenance. 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 Follow up 1 year, sooner as needed.   [name] MD, 4:40 PM 03/29/2015

## 2015-03-29 NOTE — Patient Instructions (Signed)

## 2015-04-10 ENCOUNTER — Encounter (HOSPITAL_COMMUNITY): Payer: BC Managed Care – PPO

## 2015-04-16 ENCOUNTER — Encounter (HOSPITAL_COMMUNITY)
Admission: RE | Admit: 2015-04-16 | Discharge: 2015-04-16 | Disposition: A | Discharge status: Home / Self Care | Payer: BC Managed Care – PPO | Source: Ambulatory Visit | Attending: Hematology & Oncology | Admitting: Hematology & Oncology

## 2015-04-16 DIAGNOSIS — R1013 Epigastric pain: Secondary | ICD-10-CM | POA: Diagnosis present

## 2015-04-16 DIAGNOSIS — C50012 Malignant neoplasm of nipple and areola, left female breast: Secondary | ICD-10-CM | POA: Insufficient documentation

## 2015-04-16 DIAGNOSIS — R1012 Left upper quadrant pain: Secondary | ICD-10-CM | POA: Diagnosis present

## 2015-04-16 MED ORDER — TECHNETIUM TC 99M MEBROFENIN IV KIT
5.1000 | PACK | Freq: Once | INTRAVENOUS | Status: AC | PRN
  Administered 2015-04-16: 5 via INTRAVENOUS

## 2015-04-20 ENCOUNTER — Other Ambulatory Visit: Payer: Self-pay | Admitting: Endocrinology

## 2015-05-19 ENCOUNTER — Other Ambulatory Visit: Payer: Self-pay | Admitting: Endocrinology

## 2015-05-21 ENCOUNTER — Telehealth: Payer: Self-pay | Admitting: Endocrinology

## 2015-05-21 NOTE — Telephone Encounter (Signed)
rx has already been sent for a 30 day supply, she does need an appointment for further refills.

## 2015-05-21 NOTE — Telephone Encounter (Signed)
Does the pt need to make an appt before refills or can we call in a bridge until she can be seen?

## 2015-05-22 ENCOUNTER — Ambulatory Visit (INDEPENDENT_AMBULATORY_CARE_PROVIDER_SITE_OTHER): Payer: BC Managed Care – PPO | Admitting: Family Medicine

## 2015-05-22 ENCOUNTER — Encounter: Payer: Self-pay | Admitting: Family Medicine

## 2015-05-22 VITALS — BP 126/72 | HR 76 | Temp 97.9°F | Resp 14 | Wt 141.0 lb

## 2015-05-22 DIAGNOSIS — N3001 Acute cystitis with hematuria: Secondary | ICD-10-CM | POA: Diagnosis not present

## 2015-05-22 LAB — URINALYSIS, MICROSCOPIC ONLY
CASTS: NONE SEEN [LPF]
CRYSTALS: NONE SEEN [HPF]
YEAST: NONE SEEN [HPF]

## 2015-05-22 LAB — URINALYSIS, ROUTINE W REFLEX MICROSCOPIC
BILIRUBIN URINE: NEGATIVE
GLUCOSE, UA: NEGATIVE
Ketones, ur: NEGATIVE
Nitrite: NEGATIVE
PH: 5.5 (ref 5.0–8.0)
SPECIFIC GRAVITY, URINE: 1.03 (ref 1.001–1.035)

## 2015-05-22 MED ORDER — SULFAMETHOXAZOLE-TRIMETHOPRIM 800-160 MG PO TABS: 1.0000 | ORAL_TABLET | Freq: Two times a day (BID) | ORAL | Status: DC

## 2015-05-22 NOTE — Progress Notes (Signed)
Subjective:    Patient ID: Rachel [name], female    DOB: [birth year], 50 y.o.   MRN: [id number]  HPI   Patient is a very pleasant 50 year old white female who presents with One-week of dysuria, urinary frequency, urinary urgency, hesitancy. She is also seen trace amounts of hematuria. She also reports bladder spasms and pelvic pressure that is uncomfortable. She denies any fevers or chills or low back pain Past Medical History  Diagnosis Date  . ASCUS (atypical squamous cells of undetermined significance) on Pap smear 02/2006, 05/2006    NEG HPV ----- PAP 12/2007 WNL  . Hypothyroidism   . Osteoporosis 08/2014     T score -2.5  . Hypercholesteremia   . 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 Cesarean section    . Pelvic laparoscopy    . Colposcopy    . Tonsillectomy    . Breast lumpectomy     Current Outpatient Prescriptions on File Prior to Visit  Medication Sig Dispense Refill  . butalbital-acetaminophen-caffeine (FIORICET, ESGIC) 50-325-40 MG per tablet TAKE 1 TABLET BY MOUTH EVERY 6 HOURS AS NEEDED FOR HEADACHE 20 tablet 0  . Cholecalciferol (VITAMIN D PO) Take 2,000 Units by mouth.    . fluticasone (FLONASE) 50 MCG/ACT nasal spray INHALE 2 SPRASY IN EACH NOSTRIL ONCE DAILY 16 g 11  . levothyroxine (SYNTHROID, LEVOTHROID) 100 MCG tablet TAKE 1 TABLET (100 MCG TOTAL) BY MOUTH DAILY BEFORE BREAKFAST. 30 tablet 0  . promethazine (PHENERGAN) 25 MG tablet TAKE 1 TABLET BY MOUTH EVERY 8 HOURS AS NEEDED FOR NAUSEA/VOMITING 20 tablet 0  . rizatriptan (MAXALT) 10 MG tablet TAKE 1 TABLET BY MOUTH EVERY DAY AS NEEDED FOR MIGRANE 6 tablet 4   No current facility-administered medications on file prior to visit.   Allergies  Allergen Reactions  . Erythromycin Nausea And Vomiting   Social History   Social History  . Marital Status: Married   Spouse Name: N/A  . Number of Children: N/A  . Years of Education: N/A   Occupational History  . Not on file.   Social History Main Topics  . Smoking status: Never Smoker   . Smokeless tobacco: Never Used     Comment: never used tobacco  . Alcohol Use: 0.0 oz/week    0 Standard drinks or equivalent per week  . Drug Use: No  . Sexual Activity: Yes    Birth Control/ Protection: Post-menopausal     Comment: 1st intercourse 50 yo-Fewer than 5 partners   Other Topics Concern  . Not on file   Social History Narrative     Review of Systems  All other systems reviewed and are negative.      Objective:   Physical Exam  HENT:  Mouth/Throat: Oropharynx is clear and moist. No oropharyngeal exudate.  Eyes: Conjunctivae are normal. Pupils are equal, round, and reactive to light.  Neck: Neck supple.  Cardiovascular: Normal rate, regular rhythm and normal heart sounds.   No murmur heard. Pulmonary/Chest: Effort normal and breath sounds normal.  Lymphadenopathy:    She has no cervical adenopathy.  Vitals reviewed.         Assessment & Plan:  Acute cystitis with hematuria - Plan: sulfamethoxazole-trimethoprim (BACTRIM DS,SEPTRA DS) 800-160 MG tablet  Obtain urinalysis. Symptoms are consistent with a bladder infection. Begin Bactrim double strength tablets 1  by mouth twice a day for 3 days. I will give the patient 5 days in case symptoms linger slightly longer

## 2015-05-22 NOTE — Addendum Note (Signed)
Addended by: Shary Decamp B on: 05/22/2015 04:50 PM   Modules accepted: Orders

## 2015-05-24 ENCOUNTER — Other Ambulatory Visit: Payer: Self-pay | Admitting: *Deleted

## 2015-05-24 ENCOUNTER — Encounter: Payer: Self-pay | Admitting: Endocrinology

## 2015-05-24 ENCOUNTER — Ambulatory Visit (INDEPENDENT_AMBULATORY_CARE_PROVIDER_SITE_OTHER): Payer: BC Managed Care – PPO | Admitting: Endocrinology

## 2015-05-24 VITALS — BP 116/68 | HR 64 | Temp 98.5°F | Resp 14 | Ht 64.0 in | Wt 139.8 lb

## 2015-05-24 DIAGNOSIS — E063 Autoimmune thyroiditis: Secondary | ICD-10-CM

## 2015-05-24 DIAGNOSIS — E038 Other specified hypothyroidism: Secondary | ICD-10-CM | POA: Diagnosis not present

## 2015-05-24 LAB — T3, FREE: T3, Free: 3.9 pg/mL (ref 2.3–4.2)

## 2015-05-24 LAB — TSH: TSH: 0.64 u[IU]/mL (ref 0.35–4.50)

## 2015-05-24 LAB — T4, FREE: FREE T4: 1.18 ng/dL (ref 0.60–1.60)

## 2015-05-24 MED ORDER — LEVOTHYROXINE SODIUM 100 MCG PO TABS: ORAL_TABLET | ORAL | Status: DC

## 2015-05-24 NOTE — Progress Notes (Signed)
Patient ID: Rachel [name], female   DOB: 21-Oct-1965, 50 y.o.   MRN: [id number]   Reason for Appointment:  Hypothyroidism, followup visit   History of Present Illness:   HYPOTHYROIDISM was first diagnosed in 1992  With a TSH of 51  Her thyroxine dosage has been typically difficult to regulate and requires fairly frequent adjustments of dosage despite her being compliant with brand name Synthroid. 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  TSH  is again normal but her last lab was done about 2 months ago and TSH was slightly lower  Wt Readings from Last 3 Encounters:  05/24/15 139 lb 12.8 oz (63.413 kg)  05/22/15 141 lb (63.957 kg)  03/29/15 139 lb (63.05 kg)    Lab results:  Lab Results  Component Value Date   TSH 0.502 03/23/2015   TSH 0.99 01/09/2015   TSH 0.42 11/21/2014   FREET4 0.57* 11/21/2014   FREET4 1.11 06/27/2014   FREET4 1.56 02/13/2014       Medication List       This list is accurate as of: 05/24/15 10:40 AM.  Always use your most recent med list.               butalbital-acetaminophen-caffeine 50-325-40 MG tablet  Commonly known as:  FIORICET, ESGIC  TAKE 1 TABLET BY  MOUTH EVERY 6 HOURS AS NEEDED FOR HEADACHE     fluticasone 50 MCG/ACT nasal spray  Commonly known as:  FLONASE  INHALE 2 SPRASY IN EACH NOSTRIL ONCE DAILY     levothyroxine 100 MCG tablet  Commonly known as:  SYNTHROID, LEVOTHROID  TAKE 1 TABLET (100 MCG TOTAL) BY MOUTH DAILY BEFORE BREAKFAST.     promethazine 25 MG tablet  Commonly known as:  PHENERGAN  TAKE 1 TABLET BY MOUTH EVERY 8 HOURS AS NEEDED FOR NAUSEA/VOMITING     rizatriptan 10 MG tablet  Commonly known as:  MAXALT  TAKE 1 TABLET BY MOUTH EVERY DAY AS NEEDED FOR MIGRANE     sulfamethoxazole-trimethoprim 800-160 MG tablet  Commonly known as:  BACTRIM DS,SEPTRA DS  Take 1 tablet by mouth 2 (two) times daily.     VITAMIN D PO  Take 2,000 Units by mouth.         Past Medical History  Diagnosis Date  . ASCUS (atypical squamous cells of undetermined significance) on Pap smear 02/2006, 05/2006    NEG HPV ----- PAP 12/2007 WNL  . Hypothyroidism   . Osteoporosis 08/2014     T score -2.5  . Hypercholesteremia   . 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 Chiari malformation type I [facility])     Past Surgical History  Procedure Laterality Date  . Cesarean section    . Pelvic laparoscopy    . Colposcopy    . Tonsillectomy    . Breast lumpectomy      Family History  Problem Relation Age of Onset  . Diabetes Father     Social History:  reports that she has never smoked. She has never used smokeless tobacco. She reports that she drinks alcohol. She reports that she does not use illicit drugs.  Allergies:  Allergies  Allergen Reactions  . Erythromycin Nausea And Vomiting   Review of systems:  She has a history of breast cancer  History of osteopenia present  Hypercholesterolemia: Previous LDL 150, has no other risk factors   Examination:   BP 116/68 mmHg  Pulse 64  Temp(Src) 98.5 F (36.9 C)  Resp 14  Ht 5\' 4"  (1.626 m)  Wt 139 lb 12.8 oz (63.413 kg)   BMI 23.98 kg/m2  SpO2 96%  She looks well   Biceps reflexes show normal relaxation Thyroid not palpable Skin appears normal.     Assessment    Hypothyroidism, long-standing with history of variability and TSH She is doing fairly well with levothyroxine 100 g daily which she is compliant with She does tend to have mild chronic tiredness but not new Previously did not find any subjective improvement with Armour Thyroid 75 mg  However her TSH is low normal as of 2 months ago    Plan:  Check thyroid level today and decide on dosage If normal Will see her back in 6 months   Kaulin Chaves 05/24/2015, 10:40 AM

## 2015-06-13 ENCOUNTER — Other Ambulatory Visit: Payer: BC Managed Care – PPO

## 2015-06-20 ENCOUNTER — Ambulatory Visit: Payer: BC Managed Care – PPO | Admitting: Endocrinology

## 2015-09-20 ENCOUNTER — Other Ambulatory Visit: Payer: BC Managed Care – PPO

## 2015-09-20 ENCOUNTER — Ambulatory Visit: Payer: BC Managed Care – PPO | Admitting: Hematology & Oncology

## 2015-09-21 ENCOUNTER — Other Ambulatory Visit (HOSPITAL_BASED_OUTPATIENT_CLINIC_OR_DEPARTMENT_OTHER): Payer: BC Managed Care – PPO

## 2015-09-21 ENCOUNTER — Encounter: Payer: Self-pay | Admitting: Hematology & Oncology

## 2015-09-21 ENCOUNTER — Ambulatory Visit (HOSPITAL_BASED_OUTPATIENT_CLINIC_OR_DEPARTMENT_OTHER): Payer: BC Managed Care – PPO | Admitting: Hematology & Oncology

## 2015-09-21 VITALS — BP 112/59 | HR 55 | Temp 97.7°F | Resp 18 | Ht 64.0 in | Wt 145.0 lb

## 2015-09-21 DIAGNOSIS — C50012 Malignant neoplasm of nipple and areola, left female breast: Secondary | ICD-10-CM

## 2015-09-21 DIAGNOSIS — R635 Abnormal weight gain: Secondary | ICD-10-CM

## 2015-09-21 DIAGNOSIS — C50911 Malignant neoplasm of unspecified site of right female breast: Secondary | ICD-10-CM | POA: Diagnosis not present

## 2015-09-21 DIAGNOSIS — E032 Hypothyroidism due to medicaments and other exogenous substances: Secondary | ICD-10-CM

## 2015-09-21 DIAGNOSIS — Z170 Estrogen receptor positive status [ER+]: Secondary | ICD-10-CM

## 2015-09-21 DIAGNOSIS — C50219 Malignant neoplasm of upper-inner quadrant of unspecified female breast: Secondary | ICD-10-CM

## 2015-09-21 DIAGNOSIS — M8580 Other specified disorders of bone density and structure, unspecified site: Secondary | ICD-10-CM

## 2015-09-21 DIAGNOSIS — R1012 Left upper quadrant pain: Secondary | ICD-10-CM

## 2015-09-21 DIAGNOSIS — R1013 Epigastric pain: Secondary | ICD-10-CM

## 2015-09-21 LAB — CBC WITH DIFFERENTIAL (CANCER CENTER ONLY)
BASO#: 0 10*3/uL (ref 0.0–0.2)
BASO%: 0.5 % (ref 0.0–2.0)
EOS%: 2.3 % (ref 0.0–7.0)
Eosinophils Absolute: 0.1 10*3/uL (ref 0.0–0.5)
HCT: 43.8 % (ref 34.8–46.6)
HEMOGLOBIN: 14.8 g/dL (ref 11.6–15.9)
LYMPH#: 2.2 10*3/uL (ref 0.9–3.3)
LYMPH%: 38.5 % (ref 14.0–48.0)
MCH: 30.7 pg (ref 26.0–34.0)
MCHC: 33.8 g/dL (ref 32.0–36.0)
MCV: 91 fL (ref 81–101)
MONO#: 0.4 10*3/uL (ref 0.1–0.9)
MONO%: 6.9 % (ref 0.0–13.0)
NEUT%: 51.8 % (ref 39.6–80.0)
NEUTROS ABS: 3 10*3/uL (ref 1.5–6.5)
PLATELETS: 230 10*3/uL (ref 145–400)
RBC: 4.82 10*6/uL (ref 3.70–5.32)
RDW: 12.9 % (ref 11.1–15.7)
WBC: 5.7 10*3/uL (ref 3.9–10.0)

## 2015-09-21 LAB — COMPREHENSIVE METABOLIC PANEL
ALT: 57 U/L — ABNORMAL HIGH (ref 0–55)
AST: 37 U/L — ABNORMAL HIGH (ref 5–34)
Albumin: 4 g/dL (ref 3.5–5.0)
Alkaline Phosphatase: 98 U/L (ref 40–150)
Anion Gap: 10 mEq/L (ref 3–11)
BUN: 13.9 mg/dL (ref 7.0–26.0)
CO2: 28 mEq/L (ref 22–29)
Calcium: 10.1 mg/dL (ref 8.4–10.4)
Chloride: 105 mEq/L (ref 98–109)
Creatinine: 0.8 mg/dL (ref 0.6–1.1)
EGFR: 86 mL/min/{1.73_m2} — ABNORMAL LOW (ref >90)
Glucose: 96 mg/dl (ref 70–140)
Potassium: 4.4 mEq/L (ref 3.5–5.1)
Sodium: 143 mEq/L (ref 136–145)
Total Bilirubin: 0.53 mg/dL (ref 0.20–1.20)
Total Protein: 7.2 g/dL (ref 6.4–8.3)

## 2015-09-21 LAB — TSH: TSH: 0.26 m(IU)/L — ABNORMAL LOW (ref 0.308–3.960)

## 2015-09-21 NOTE — Progress Notes (Signed)
Hematology and Oncology Follow Up Visit  [facility] [id number] 1965-08-29 50 y.o. 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  Kildow is back for followup. She really has had a very busy summer. 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  Her youngest son got married last weekend. Her father-in-law is one of our patients. He recently got treated for head and neck cancer. He had radiation chemotherapy.   She is most worried about her weight gain. She is not happy about gaining weight. She is on Synthroid. We will have see what her thyroid levels are.  Otherwise, she has not had a mammogram in about 2 years. She is reluctant to get mammograms as the mammogram did not show up the breast cancer that she had 16 years ago. I told her that the newer mammograms are 3-D. They're much more sensitive. She will agree to have one done.   She has had no problems with bowels or bladder. She has had no issues with nausea or vomiting. She has had no rashes. She has had no cough or shortness of breath.   She is trying to exercise. Again with her exercise, she just is not losing weight.   Overall, her performance status is ECOG 0.   Medications:  Current Outpatient Prescriptions:  .  butalbital-acetaminophen-caffeine (FIORICET, ESGIC) 50-325-40 MG per tablet, TAKE 1 TABLET BY MOUTH EVERY 6 HOURS AS NEEDED FOR HEADACHE, Disp: 20 tablet, Rfl: 0 .  Cholecalciferol (VITAMIN D PO), Take 2,000 Units by mouth., Disp: , Rfl:  .  fluticasone (FLONASE) 50 MCG/ACT nasal spray, INHALE 2 SPRASY IN EACH NOSTRIL ONCE DAILY, Disp: 16 g, Rfl: 11 .  levothyroxine (SYNTHROID, LEVOTHROID) 100 MCG tablet, TAKE 1 TABLET (100 MCG TOTAL) BY MOUTH DAILY BEFORE BREAKFAST., Disp: 30 tablet, Rfl: 6 .  promethazine (PHENERGAN) 25 MG tablet, TAKE 1 TABLET BY MOUTH EVERY 8 HOURS AS NEEDED FOR  NAUSEA/VOMITING, Disp: 20 tablet, Rfl: 0 .  rizatriptan (MAXALT) 10 MG tablet, TAKE 1 TABLET BY MOUTH EVERY DAY AS NEEDED FOR MIGRANE, Disp: 6 tablet, Rfl: 4  Allergies:  Allergies  Allergen Reactions  . Erythromycin Nausea And Vomiting    Past Medical History, Surgical history, Social history, and Family History were reviewed and updated.  Review of Systems: As above  Physical Exam:  height is 5\' 4"  (1.626 m) and weight is 145 lb (65.8 kg). Her oral temperature is 97.7 F (36.5 C). Her blood pressure is 112/59 (abnormal) and her pulse is 55 (abnormal). Her respiration is 18.   Well-developed and well-nourished white female. Head and neck exam shows no ocular or oral lesions. She has no palpable cervical or supraclavicular lymph nodes. Lungs are clear bilaterally. Cardiac exam regular rate and rhythm with no murmurs rubs or bruits. Abdomen is soft. Has good bowel sounds. There is no fluid wave. There is no palpable liver or spleen tip. Breast exam shows left breast no masses edema or erythema. There is no left axillary adenopathy. Right breast is slightly contracted from surgery and radiation. She has a well-healed lumpectomy at the 10:00 position. No distinct mass is noted in the right breast. There is no right axillary adenopathy. Back exam shows no tenderness over the spine ribs or hips. Extremities shows no clubbing cyanosis or edema. Skin exam no rashes, ecchymosis or petechia. Neurological exam is nonfocal.  Lab Results  Component Value Date   WBC 5.7 09/21/2015   HGB 14.8 09/21/2015   HCT 43.8 09/21/2015   MCV 91 09/21/2015   PLT 230 09/21/2015     Chemistry      Component Value Date/Time   NA 140 03/23/2015 1215   K 3.6 03/23/2015 1215   CL 109 (H) 09/19/2014 1017   CO2 26 03/23/2015 1215   BUN 18.8 03/23/2015 1215   CREATININE 0.8 03/23/2015 1215      Component Value Date/Time   CALCIUM 9.4 03/23/2015 1215   ALKPHOS 82 03/23/2015 1215   AST 24 03/23/2015 1215   ALT  28 03/23/2015 1215   BILITOT 0.34 03/23/2015 1215        Impression and Plan: Ms. Alarid is a 50 year old white female with a history of stage I ductal carcinoma of the right breast. She was diagnosed 15 years ago. 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 I would think that  she is cured.  I talked to her at length about doing a genetic analysis. She had her breast cancer when she was 50 years old. I think that it would help her but also would help her 2 boys.  I think one of her biggest issues is her weight gain area did she really is upset about her weight gain. I'll have to see how I might be a would help this. She is on Synthroid. We will see what her TSH is.  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   I will plan to see her back in 6 months. When I see her back, we will have to get the genetic testing done on her.   As always, we caught up on was going on with our kids. Her youngest son got married last weekend  Volanda Napoleon, MD 8/11/201710:28 AM

## 2015-09-22 LAB — VITAMIN D 25 HYDROXY (VIT D DEFICIENCY, FRACTURES): VIT D 25 HYDROXY: 41.5 ng/mL (ref 30.0–100.0)

## 2015-09-27 ENCOUNTER — Other Ambulatory Visit: Payer: Self-pay | Admitting: Hematology & Oncology

## 2015-09-27 DIAGNOSIS — Z1231 Encounter for screening mammogram for malignant neoplasm of breast: Secondary | ICD-10-CM

## 2015-09-28 ENCOUNTER — Telehealth: Payer: Self-pay | Admitting: Endocrinology

## 2015-09-28 NOTE — Telephone Encounter (Signed)
TSH is low, she will change her medication to 88 g levothyroxine and keep appointment in October

## 2015-09-28 NOTE — Telephone Encounter (Signed)
Please advise, Thanks!  

## 2015-09-28 NOTE — Telephone Encounter (Signed)
Patient ask if received her lab results from Dr Marin Olp office. Please advise

## 2015-10-02 MED ORDER — LEVOTHYROXINE SODIUM 88 MCG PO TABS: 88.0000 ug | ORAL_TABLET | Freq: Every day | ORAL | 3 refills | Status: DC

## 2015-10-02 NOTE — Telephone Encounter (Signed)
I contacted the pt and advised of MD's note via vm. Requested a call back if the pt would like to discuss. Rx submitted for 88 mcg of levothyroxine.

## 2015-10-05 ENCOUNTER — Encounter: Payer: Self-pay | Admitting: Family Medicine

## 2015-10-05 ENCOUNTER — Ambulatory Visit (INDEPENDENT_AMBULATORY_CARE_PROVIDER_SITE_OTHER): Payer: BC Managed Care – PPO | Admitting: Family Medicine

## 2015-10-05 VITALS — BP 118/74 | HR 64 | Temp 98.3°F | Resp 16 | Ht 64.0 in | Wt 147.0 lb

## 2015-10-05 DIAGNOSIS — J208 Acute bronchitis due to other specified organisms: Secondary | ICD-10-CM | POA: Diagnosis not present

## 2015-10-05 MED ORDER — HYDROCODONE-HOMATROPINE 5-1.5 MG/5ML PO SYRP: 5.0000 mL | ORAL_SOLUTION | Freq: Three times a day (TID) | ORAL | 0 refills | Status: DC | PRN

## 2015-10-05 MED ORDER — AMOXICILLIN-POT CLAVULANATE 875-125 MG PO TABS: 1.0000 | ORAL_TABLET | Freq: Two times a day (BID) | ORAL | 0 refills | Status: DC

## 2015-10-05 NOTE — Progress Notes (Signed)
Subjective:    Patient ID: Rachel [name], female    DOB: March 28, 1965, 50 y.o.   MRN: [id number]  HPI Patient has been sick for last 2-3 days. She had a low-grade fever earlier this week. She has a cough productive of white and green sputum. She denies any chest pain. She does report chest congestion. She denies any pleurisy or any hemoptysis. She denies any sinus pain. She does have rhinorrhea and head congestion. She is prone to sinus infections Past Medical History:  Diagnosis Date  . ASCUS (atypical squamous cells of undetermined significance) on Pap smear 02/2006, 05/2006   NEG HPV ----- PAP 12/2007 WNL  . 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 Chiari malformation type I ([place])   . Hypercholesteremia   . Hypothyroidism   . Osteoporosis 08/2014    T score -2.5   Past Surgical History:  Procedure Laterality Date  . BREAST LUMPECTOMY    . CESAREAN SECTION    . COLPOSCOPY    . PELVIC LAPAROSCOPY    . TONSILLECTOMY     Current Outpatient Prescriptions on File Prior to Visit  Medication Sig Dispense Refill  . butalbital-acetaminophen-caffeine (FIORICET, ESGIC) 50-325-40 MG per tablet TAKE 1 TABLET BY MOUTH EVERY 6 HOURS AS NEEDED FOR HEADACHE 20 tablet 0  . Cholecalciferol (VITAMIN D PO) Take 2,000 Units by mouth.    . fluticasone (FLONASE) 50 MCG/ACT nasal spray INHALE 2 SPRASY IN EACH NOSTRIL ONCE DAILY 16 g 11  . levothyroxine (SYNTHROID, LEVOTHROID) 88 MCG tablet Take 1 tablet (88 mcg total) by mouth daily. 90 tablet 3  . promethazine (PHENERGAN) 25 MG tablet TAKE 1 TABLET BY MOUTH EVERY 8 HOURS AS NEEDED FOR NAUSEA/VOMITING 20 tablet 0  . rizatriptan (MAXALT) 10 MG tablet TAKE 1 TABLET BY MOUTH EVERY DAY AS NEEDED FOR MIGRANE 6 tablet 4   No current facility-administered medications on file prior to visit.    Allergies  Allergen Reactions  . Erythromycin Nausea And Vomiting   Social History   Social History  . Marital  status: Married    Spouse name: N/A  . Number of children: N/A  . Years of education: N/A   Occupational History  . Not on file.   Social History Main Topics  . Smoking status: Never Smoker  . Smokeless tobacco: Never Used     Comment: never used tobacco  . Alcohol use 0.0 oz/week  . Drug use: No  . Sexual activity: Yes    Birth control/ protection: Post-menopausal     Comment: 1st intercourse 50 yo-Fewer than 5 partners   Other Topics Concern  . Not on file   Social History Narrative  . No narrative on file      Review of Systems  All other systems reviewed and are negative.      Objective:   Physical Exam  Constitutional: She appears well-developed and well-nourished. No distress.  HENT:  Head: Normocephalic and atraumatic.  Right Ear: External ear normal.  Left Ear: External ear normal.  Nose: Mucosal edema and rhinorrhea present. Right sinus exhibits no maxillary sinus tenderness and no frontal sinus tenderness. Left sinus exhibits no maxillary sinus tenderness and no frontal sinus tenderness.  Mouth/Throat: Oropharynx is clear and moist. No oropharyngeal exudate.  Eyes: Conjunctivae are normal.  Neck: Neck supple.  Cardiovascular: Normal rate, regular rhythm and normal heart sounds.   No murmur heard. Pulmonary/Chest: Effort normal and breath sounds normal.  No respiratory distress. She has no wheezes. She has no rales.  Lymphadenopathy:    She has no cervical adenopathy.  Skin: She is not diaphoretic.  Vitals reviewed.         Assessment & Plan:  Acute bronchitis due to other specified organisms - Plan: HYDROcodone-homatropine (HYCODAN) 5-1.5 MG/5ML syrup  Patient appears to have bronchitis. I recommended Mucinex for chest congestion. She can use Hycodan 1 teaspoon every 8 hours as needed for cough. I did give the patient a prescription for Augmentin. I believe she has a viral bronchitis and I recommended tincture of time. However she is prone to  develop secondary sinus infections. Next week should she develop a severe sinus infection as evidenced by severe headache, maxillary sinus pain and pressure, high fever, purulent nasal discharge, she can get the antibiotic without having to return. She will not fill the antibiotic unless the symptoms develop

## 2015-11-20 ENCOUNTER — Ambulatory Visit
Admission: RE | Admit: 2015-11-20 | Discharge: 2015-11-20 | Disposition: A | Discharge status: Home / Self Care | Payer: BC Managed Care – PPO | Source: Ambulatory Visit | Attending: Hematology & Oncology | Admitting: Hematology & Oncology

## 2015-11-20 DIAGNOSIS — Z1231 Encounter for screening mammogram for malignant neoplasm of breast: Secondary | ICD-10-CM

## 2015-11-23 ENCOUNTER — Other Ambulatory Visit: Payer: BC Managed Care – PPO

## 2015-11-29 ENCOUNTER — Ambulatory Visit: Payer: BC Managed Care – PPO | Admitting: Endocrinology

## 2015-12-05 ENCOUNTER — Other Ambulatory Visit (INDEPENDENT_AMBULATORY_CARE_PROVIDER_SITE_OTHER): Payer: BC Managed Care – PPO

## 2015-12-05 DIAGNOSIS — E038 Other specified hypothyroidism: Secondary | ICD-10-CM

## 2015-12-05 DIAGNOSIS — E063 Autoimmune thyroiditis: Secondary | ICD-10-CM

## 2015-12-05 LAB — TSH: TSH: 0.39 u[IU]/mL (ref 0.35–4.50)

## 2015-12-05 LAB — T4, FREE: FREE T4: 1.04 ng/dL (ref 0.60–1.60)

## 2015-12-10 ENCOUNTER — Encounter: Payer: Self-pay | Admitting: Endocrinology

## 2015-12-10 ENCOUNTER — Ambulatory Visit (INDEPENDENT_AMBULATORY_CARE_PROVIDER_SITE_OTHER): Payer: BC Managed Care – PPO | Admitting: Endocrinology

## 2015-12-10 VITALS — BP 118/74 | HR 63 | Ht 64.0 in | Wt 145.0 lb

## 2015-12-10 DIAGNOSIS — E038 Other specified hypothyroidism: Secondary | ICD-10-CM

## 2015-12-10 DIAGNOSIS — E063 Autoimmune thyroiditis: Secondary | ICD-10-CM

## 2015-12-10 NOTE — Progress Notes (Signed)
Patient ID: Rachel [name], female   DOB: December 28, 1965, 50 y.o.   MRN: [id number]   Reason for Appointment:  Hypothyroidism, followup visit   History of Present Illness:   HYPOTHYROIDISM was first diagnosed in 1992  With a TSH of 51  Her thyroxine dosage has been typically difficult to regulate and requires fairly frequent adjustments of dosage despite her being compliant with brand name Synthroid. 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  TSH was low in 8/17 with taking 100 g and she is now taking 88 g She does not feel any different with this   Wt Readings from Last 3 Encounters:  12/10/15 145 lb (65.8 kg)  10/05/15 147 lb (66.7 kg)  09/21/15 145 lb (65.8 kg)    Lab results:  Lab Results  Component Value Date   TSH 0.39 12/05/2015   TSH 0.260 (L) 09/21/2015   TSH 0.64 05/24/2015   FREET4 1.04 12/05/2015   FREET4 1.18 05/24/2015   FREET4 0.57 (L) 11/21/2014       Medication List       Accurate as of 12/10/15  3:09 PM. Always use your most recent med list.          amoxicillin-clavulanate 875-125 MG tablet Commonly known as:  AUGMENTIN Take 1 tablet by mouth 2 (two) times daily.     butalbital-acetaminophen-caffeine 50-325-40 MG tablet Commonly known as:  FIORICET, ESGIC TAKE 1 TABLET BY MOUTH EVERY 6 HOURS AS NEEDED FOR HEADACHE   fluticasone 50 MCG/ACT nasal spray Commonly known as:  FLONASE INHALE 2 SPRASY IN EACH NOSTRIL ONCE DAILY   HYDROcodone-homatropine 5-1.5 MG/5ML syrup Commonly known as:  HYCODAN Take 5 mLs by mouth every 8 (eight) hours as needed for cough.   levothyroxine 88 MCG tablet Commonly known as:  SYNTHROID, LEVOTHROID Take 1 tablet (88 mcg total) by mouth daily.   promethazine 25 MG tablet Commonly known as:  PHENERGAN TAKE 1 TABLET BY MOUTH EVERY 8 HOURS AS NEEDED FOR NAUSEA/VOMITING   rizatriptan 10 MG tablet Commonly known as:  MAXALT TAKE 1 TABLET BY MOUTH EVERY DAY AS NEEDED FOR MIGRANE   VITAMIN D PO Take 2,000 Units by mouth.        Past Medical History:  Diagnosis Date  . ASCUS (atypical squamous cells of undetermined significance) on Pap smear 02/2006, 05/2006   NEG HPV ----- PAP 12/2007 WNL  . 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 Chiari malformation type I ([place])   . Hypercholesteremia   . Hypothyroidism   . Osteoporosis 08/2014  T score -2.5    Past Surgical History:  Procedure Laterality Date  . BREAST LUMPECTOMY    . CESAREAN SECTION    . COLPOSCOPY    . PELVIC LAPAROSCOPY    . TONSILLECTOMY      Family History  Problem Relation Age of Onset  . Diabetes Father     Social History:  reports that she has never smoked. She has never used smokeless tobacco. She reports that she drinks alcohol. She reports that she does not use drugs.  Allergies:  Allergies  Allergen Reactions  . Erythromycin Nausea And Vomiting   Review of systems:   History of osteopenia present  Hypercholesterolemia: Previous LDL 150, has no other risk factors   Examination:   BP 118/74   Pulse 63   Ht 5\' 4"  (1.626 m)   Wt 145 lb (65.8 kg)   SpO2 98%   BMI 24.89  kg/m   She looks well   Biceps reflexes show Slightly brisk relaxation, no tremor Thyroid not palpable Skin appears normal.     Assessment    Hypothyroidism, long-standing with history of variability and TSH She is doing clinically fairly well with now taking levothyroxine 88 g since August TSH is still low normal but not depressed  Previously TSH was low on 100 g  She does tend to have cold intolerance  For now she has been taking levothyroxine without much variability in her dose   Plan: Since TSH is low normal she will take 6-1/2 tablets a week Check thyroid level again in 6 weeks   Dreden Rivere 12/10/2015, 3:09 PM

## 2016-02-07 ENCOUNTER — Encounter: Payer: BC Managed Care – PPO | Admitting: Endocrinology

## 2016-02-07 DIAGNOSIS — E063 Autoimmune thyroiditis: Secondary | ICD-10-CM

## 2016-02-07 LAB — TSH: TSH: 1.79 u[IU]/mL (ref 0.35–4.50)

## 2016-02-11 NOTE — Progress Notes (Signed)
Please let patient know that the lab result is normal, stay on 6-1/2 tablets a week

## 2016-02-12 ENCOUNTER — Other Ambulatory Visit: Payer: Self-pay | Admitting: Family Medicine

## 2016-03-11 ENCOUNTER — Encounter: Payer: Self-pay | Admitting: Family Medicine

## 2016-03-11 ENCOUNTER — Ambulatory Visit
Admission: RE | Admit: 2016-03-11 | Discharge: 2016-03-11 | Disposition: A | Discharge status: Home / Self Care | Payer: BC Managed Care – PPO | Source: Ambulatory Visit | Attending: Family Medicine | Admitting: Family Medicine

## 2016-03-11 ENCOUNTER — Ambulatory Visit (INDEPENDENT_AMBULATORY_CARE_PROVIDER_SITE_OTHER): Payer: BC Managed Care – PPO | Admitting: Family Medicine

## 2016-03-11 VITALS — BP 110/70 | HR 62 | Temp 98.5°F | Resp 16 | Ht 64.0 in | Wt 150.0 lb

## 2016-03-11 DIAGNOSIS — M79644 Pain in right finger(s): Secondary | ICD-10-CM

## 2016-03-11 NOTE — Progress Notes (Signed)
   Subjective:    Patient ID: Rachel [name], female    DOB: Mar 26, 1965, 51 y.o.   MRN: [id number]  HPI  Patient lacerated the dorsum of her left thumb over the IP joint over Christmas. Laceration healed on its own. Patient has full extension and flexion of the joint but she does have pain near the IP joint. She also reports feeling a fullness, firm area near the IP joint. Patient could be developing a ganglion cyst versus scar tissue Past Medical History:  Diagnosis Date  . ASCUS (atypical squamous cells of undetermined significance) on Pap smear 02/2006, 05/2006   NEG HPV ----- PAP 12/2007 WNL  . 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 Chiari malformation type I ([place])   . Hypercholesteremia   . Hypothyroidism   . Osteoporosis 08/2014    T score -2.5   Past Surgical History:  Procedure Laterality Date  . BREAST LUMPECTOMY    . CESAREAN SECTION    . COLPOSCOPY    . PELVIC LAPAROSCOPY    . TONSILLECTOMY     Current Outpatient Prescriptions on File Prior to Visit  Medication Sig Dispense Refill  . butalbital-acetaminophen-caffeine (FIORICET, ESGIC) 50-325-40 MG per tablet TAKE 1 TABLET BY MOUTH EVERY 6 HOURS AS NEEDED FOR HEADACHE 20 tablet 0  . Cholecalciferol (VITAMIN D PO) Take 2,000 Units by mouth.    . fluticasone (FLONASE) 50 MCG/ACT nasal spray INHALE 2 SPRASY IN EACH NOSTRIL ONCE DAILY 16 g 11  . levothyroxine (SYNTHROID, LEVOTHROID) 88 MCG tablet Take 1 tablet (88 mcg total) by mouth daily. 90 tablet 3  . promethazine (PHENERGAN) 25 MG tablet TAKE 1 TABLET BY MOUTH EVERY 8 HOURS AS NEEDED FOR NAUSEA/VOMITING 20 tablet 0  . rizatriptan (MAXALT) 10 MG tablet TAKE 1 TABLET BY MOUTH EVERY DAY AS NEEDED FOR MIGRANE 6 tablet 1   No current facility-administered medications on file prior to visit.    Allergies  Allergen Reactions  . Erythromycin Nausea And Vomiting   Social History   Social History  . Marital status: Married    Spouse name: N/A  . Number of children: N/A  . Years of education: N/A   Occupational History  . Not on file.   Social History Main Topics  . Smoking status: Never Smoker  . Smokeless tobacco: Never Used     Comment: never used tobacco  . Alcohol use 0.0 oz/week  . Drug use: No  . Sexual activity: Yes    Birth control/ protection: Post-menopausal     Comment: 1st intercourse 51 yo-Fewer than 5 partners   Other Topics Concern  . Not on file   Social History Narrative  . No narrative on file     Review of Systems  All other systems reviewed and are negative.      Objective:   Physical Exam  Cardiovascular: Normal rate, regular rhythm and normal heart sounds.   Pulmonary/Chest: Effort normal and breath sounds normal. No respiratory distress. She has no wheezes. She has no rales.  Vitals reviewed.         Assessment & Plan:

## 2016-03-11 NOTE — Progress Notes (Signed)
Subjective:    Patient ID: Rachel [name], female    DOB: July 21, 1965, 51 y.o.   MRN: [id number]  HPI Patient lacerated the dorsum of her left thumb over Christmas with a knife cutting food. The laceration healed spontaneously on its own. She has full flexion and extension of the IP joint. However she reports pain. There is a firm area near the laceration site which could represent scar tissue versus an early ganglion cyst. There is no erythema or warmth Past Medical History:  Diagnosis Date  . ASCUS (atypical squamous cells of undetermined significance) on Pap smear 02/2006, 05/2006   NEG HPV ----- PAP 12/2007 WNL  . 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 Chiari malformation type I ([place])   . Hypercholesteremia   . Hypothyroidism   . Osteoporosis 08/2014    T score -2.5   Past Surgical History:  Procedure Laterality Date  . BREAST LUMPECTOMY    . CESAREAN SECTION    . COLPOSCOPY    . PELVIC LAPAROSCOPY    . TONSILLECTOMY     Current Outpatient Prescriptions on File Prior to Visit  Medication Sig Dispense Refill  . butalbital-acetaminophen-caffeine (FIORICET, ESGIC) 50-325-40 MG per tablet TAKE 1 TABLET BY MOUTH EVERY 6 HOURS AS NEEDED FOR HEADACHE 20 tablet 0  . Cholecalciferol (VITAMIN D PO) Take 2,000 Units by mouth.    . fluticasone (FLONASE) 50 MCG/ACT nasal spray INHALE 2 SPRASY IN EACH NOSTRIL ONCE DAILY 16 g 11  . levothyroxine (SYNTHROID, LEVOTHROID) 88 MCG tablet Take 1 tablet (88 mcg total) by mouth daily. 90 tablet 3  . promethazine (PHENERGAN) 25 MG tablet TAKE 1 TABLET BY MOUTH EVERY 8 HOURS AS NEEDED FOR NAUSEA/VOMITING 20 tablet 0  . rizatriptan (MAXALT) 10 MG tablet TAKE 1 TABLET BY MOUTH EVERY DAY AS NEEDED FOR MIGRANE 6 tablet 1   No current facility-administered medications on file prior to visit.    Allergies  Allergen Reactions  . Erythromycin Nausea And Vomiting   Social History   Social History  .  Marital status: Married    Spouse name: N/A  . Number of children: N/A  . Years of education: N/A   Occupational History  . Not on file.   Social History Main Topics  . Smoking status: Never Smoker  . Smokeless tobacco: Never Used     Comment: never used tobacco  . Alcohol use 0.0 oz/week  . Drug use: No  . Sexual activity: Yes    Birth control/ protection: Post-menopausal     Comment: 1st intercourse 51 yo-Fewer than 5 partners   Other Topics Concern  . Not on file   Social History Narrative  . No narrative on file      Review of Systems  All other systems reviewed and are negative.      Objective:   Physical Exam  Cardiovascular: Normal rate, regular rhythm and normal heart sounds.   Pulmonary/Chest: Effort normal and breath sounds normal.  Vitals reviewed.  See hpi       Assessment & Plan:  Thumb pain, right - Plan: DG Hand Complete Left  Obtain x-ray of the hand. I believe the patient's likely developing a ganglion cyst versus scar tissue. I see no evidence of an infection. There is no evidence of damage to the extensor tendon. She has full range of motion in the joint. If x-ray is normal, we will monitor the situation clinically. May need  referral to a hand surgeon if she needs excision of ganglion cyst

## 2016-03-12 ENCOUNTER — Encounter: Payer: Self-pay | Admitting: Family Medicine

## 2016-03-28 ENCOUNTER — Other Ambulatory Visit: Payer: Self-pay | Admitting: *Deleted

## 2016-03-28 ENCOUNTER — Ambulatory Visit (HOSPITAL_BASED_OUTPATIENT_CLINIC_OR_DEPARTMENT_OTHER): Payer: BC Managed Care – PPO

## 2016-03-28 ENCOUNTER — Ambulatory Visit (HOSPITAL_BASED_OUTPATIENT_CLINIC_OR_DEPARTMENT_OTHER): Payer: BC Managed Care – PPO | Admitting: Hematology & Oncology

## 2016-03-28 VITALS — BP 100/68 | HR 69 | Temp 98.2°F | Resp 18 | Ht 64.0 in | Wt 149.8 lb

## 2016-03-28 DIAGNOSIS — R1310 Dysphagia, unspecified: Secondary | ICD-10-CM

## 2016-03-28 DIAGNOSIS — Z171 Estrogen receptor negative status [ER-]: Secondary | ICD-10-CM

## 2016-03-28 DIAGNOSIS — Z170 Estrogen receptor positive status [ER+]: Secondary | ICD-10-CM

## 2016-03-28 DIAGNOSIS — C50611 Malignant neoplasm of axillary tail of right female breast: Secondary | ICD-10-CM

## 2016-03-28 DIAGNOSIS — C50911 Malignant neoplasm of unspecified site of right female breast: Secondary | ICD-10-CM

## 2016-03-28 DIAGNOSIS — E7800 Pure hypercholesterolemia, unspecified: Secondary | ICD-10-CM

## 2016-03-28 DIAGNOSIS — C50219 Malignant neoplasm of upper-inner quadrant of unspecified female breast: Secondary | ICD-10-CM

## 2016-03-28 DIAGNOSIS — E032 Hypothyroidism due to medicaments and other exogenous substances: Secondary | ICD-10-CM

## 2016-03-28 DIAGNOSIS — M8580 Other specified disorders of bone density and structure, unspecified site: Secondary | ICD-10-CM

## 2016-03-28 LAB — COMPREHENSIVE METABOLIC PANEL (CC13)
A/G RATIO: 1.9 (ref 1.2–2.2)
ALT: 25 IU/L (ref 0–32)
AST (SGOT): 21 IU/L (ref 0–40)
Albumin, Serum: 4.3 g/dL (ref 3.5–5.5)
Alkaline Phosphatase, S: 88 IU/L (ref 39–117)
BUN / CREAT RATIO: 24 — AB (ref 9–23)
BUN: 20 mg/dL (ref 6–24)
Bilirubin Total: 0.3 mg/dL (ref 0.0–1.2)
CALCIUM: 10 mg/dL (ref 8.7–10.2)
CHLORIDE: 103 mmol/L (ref 96–106)
Carbon Dioxide, Total: 29 mmol/L (ref 18–29)
Creatinine, Ser: 0.82 mg/dL (ref 0.57–1.00)
GFR, EST AFRICAN AMERICAN: 97 mL/min/{1.73_m2} (ref >59)
GFR, EST NON AFRICAN AMERICAN: 84 mL/min/{1.73_m2} (ref >59)
GLOBULIN, TOTAL: 2.3 g/dL (ref 1.5–4.5)
Glucose: 103 mg/dL — ABNORMAL HIGH (ref 65–99)
POTASSIUM: 3.5 mmol/L (ref 3.5–5.2)
Sodium: 138 mmol/L (ref 134–144)
TOTAL PROTEIN: 6.6 g/dL (ref 6.0–8.5)

## 2016-03-28 LAB — CBC WITH DIFFERENTIAL (CANCER CENTER ONLY)
BASO#: 0 10*3/uL (ref 0.0–0.2)
BASO%: 0.4 % (ref 0.0–2.0)
EOS ABS: 0.1 10*3/uL (ref 0.0–0.5)
EOS%: 1.5 % (ref 0.0–7.0)
HCT: 39.8 % (ref 34.8–46.6)
HGB: 13.5 g/dL (ref 11.6–15.9)
LYMPH#: 2.9 10*3/uL (ref 0.9–3.3)
LYMPH%: 39.1 % (ref 14.0–48.0)
MCH: 31 pg (ref 26.0–34.0)
MCHC: 33.9 g/dL (ref 32.0–36.0)
MCV: 92 fL (ref 81–101)
MONO#: 0.5 10*3/uL (ref 0.1–0.9)
MONO%: 7 % (ref 0.0–13.0)
NEUT#: 3.9 10*3/uL (ref 1.5–6.5)
NEUT%: 52 % (ref 39.6–80.0)
Platelets: 224 10*3/uL (ref 145–400)
RBC: 4.35 10*6/uL (ref 3.70–5.32)
RDW: 12.9 % (ref 11.1–15.7)
WBC: 7.4 10*3/uL (ref 3.9–10.0)

## 2016-03-28 NOTE — Progress Notes (Signed)
Hematology and Oncology Follow Up Visit  Shatona Andujar [facility] [id number] 01-26-1966 51 y.o. 03/28/2016   Principle Diagnosis:  Stage I (T1 N0M0) infiltrating ductal carcinoma the right breast  Current Therapy:    Observation     Interim History:  Ms.  Carrozza is back for followup. She is still busy with school. 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 She is quite busy. I think she travels around to try to help improve teaching methods.   She is not having about her weight. She still is gaining weight. Unfortunately, she is not able to exercise a lot. This is probably her biggest problem.  She has decided on doing a genetic analysis. She had her breast cancer about 16 years ago. She clearly qualifies for genetic testing. We will have to go through the genetic counselor about this.   Her last mammogram was done back in October. Everything looked okay.  She does have issues with cholesterol. We will see about rechecking her cholesterol.  Her vitamin D levels have been okay. Back in August, her vitamin D level was 42.  She does see her gynecologist in a week or so. I think he serves as her family doctor.  She's got through the winter without having the flu. She is not having any problems with cough. There is no change in bowel or bladder habits. She's had no nausea or vomiting.  As always, we talked about our children. Nobody has any grandkids yet.   Overall, her performance status is ECOG 0.   Medications:  Current Outpatient Prescriptions:  .  butalbital-acetaminophen-caffeine (FIORICET, ESGIC) 50-325-40 MG per tablet, TAKE 1 TABLET BY MOUTH EVERY 6 HOURS AS NEEDED FOR HEADACHE, Disp: 20 tablet, Rfl: 0 .  Cholecalciferol (VITAMIN D PO), Take 2,000 Units by mouth., Disp: , Rfl:  .  fluticasone (FLONASE) 50 MCG/ACT nasal spray, INHALE 2 SPRASY IN EACH NOSTRIL ONCE DAILY, Disp: 16 g, Rfl: 11 .  levothyroxine (SYNTHROID, LEVOTHROID) 88 MCG tablet, Take 1 tablet  (88 mcg total) by mouth daily., Disp: 90 tablet, Rfl: 3 .  promethazine (PHENERGAN) 25 MG tablet, TAKE 1 TABLET BY MOUTH EVERY 8 HOURS AS NEEDED FOR NAUSEA/VOMITING, Disp: 20 tablet, Rfl: 0 .  rizatriptan (MAXALT) 10 MG tablet, TAKE 1 TABLET BY MOUTH EVERY DAY AS NEEDED FOR MIGRANE, Disp: 6 tablet, Rfl: 1  Allergies:  Allergies  Allergen Reactions  . Erythromycin Nausea And Vomiting    Past Medical History, Surgical history, Social history, and Family History were reviewed and updated.  Review of Systems: As above  Physical Exam:  height is 5' 4"  (1.626 m) and weight is 149 lb 12.8 oz (67.9 kg). Her oral temperature is 98.2 F (36.8 C). Her blood pressure is 100/68 and her pulse is 69. Her respiration is 18 and oxygen saturation is 100%.   Well-developed and well-nourished white female. Head and neck exam shows no ocular or oral lesions. She has no palpable cervical or supraclavicular lymph nodes. Lungs are clear bilaterally. Cardiac exam regular rate and rhythm with no murmurs rubs or bruits. Abdomen is soft. Has good bowel sounds. There is no fluid wave. There is no palpable liver or spleen tip. Breast exam shows left breast no masses edema or erythema. There is no left axillary adenopathy. Right breast is slightly contracted from surgery and radiation. She has a well-healed lumpectomy at the 10:00 position. No distinct mass is noted in the right breast. There is no right axillary  adenopathy. Back exam shows no tenderness over the spine ribs or hips. Extremities shows no clubbing cyanosis or edema. Skin exam no rashes, ecchymosis or petechia. Neurological exam is nonfocal.  Lab Results  Component Value Date   WBC 7.4 03/28/2016   HGB 13.5 03/28/2016   HCT 39.8 03/28/2016   MCV 92 03/28/2016   PLT 224 03/28/2016     Chemistry      Component Value Date/Time   NA 143 09/21/2015 0854   K 4.4 09/21/2015 0854   CL 109 (H) 09/19/2014 1017   CO2 28 09/21/2015 0854   BUN 13.9 09/21/2015  0854   CREATININE 0.8 09/21/2015 0854      Component Value Date/Time   CALCIUM 10.1 09/21/2015 0854   ALKPHOS 98 09/21/2015 0854   AST 37 (H) 09/21/2015 0854   ALT 57 (H) 09/21/2015 0854   BILITOT 0.53 09/21/2015 0854        Impression and Plan: Ms. Vanecek is a 51 year old white female with a history of stage I ductal carcinoma of the right breast. She was diagnosed 15 years ago. 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 I would think that  she is cured.  We will need to get her set up for genetic counseling. Apparently, this is the only way week and get the BRCA test done. I think this is important for her. She has 2 sons. She does not have a daughters but I think results from the genetic analysis will help her boys.  I will definitely pray for her father-in-law. We took care of him with head and neck cancer. He is still having a very hard time recovering. He still has a very hard time swallowing. Hopefully, he is not had damage from radiation with his swallowing.  I will plan to see her back in another 6 months. She feels very confident, back to see [place] as she feels that we really listen to her and are able to give her a good examination.   Volanda Napoleon, MD 2/16/20184:37 PM

## 2016-03-29 LAB — VITAMIN D 25 HYDROXY (VIT D DEFICIENCY, FRACTURES): Vitamin D, 25-Hydroxy: 42.3 ng/mL (ref 30.0–100.0)

## 2016-03-31 LAB — LIPID PANEL
CHOL/HDL RATIO: 5.2 ratio — AB (ref 0.0–4.4)
CHOLESTEROL TOTAL: 233 mg/dL — AB (ref 100–199)
Chol/HDL Ratio: 5.2 ratio units — ABNORMAL HIGH (ref 0.0–4.4)
Cholesterol, Total: 233 mg/dL — ABNORMAL HIGH (ref 100–199)
HDL: 45 mg/dL (ref >39)
HDL: 45 mg/dL (ref >39)
LDL CALC: 145 mg/dL — AB (ref 0–99)
LDL Calculated: 145 mg/dL — ABNORMAL HIGH (ref 0–99)
TRIGLYCERIDES: 214 mg/dL — AB (ref 0–149)
Triglycerides: 214 mg/dL — ABNORMAL HIGH (ref 0–149)
VLDL CHOLESTEROL CAL: 43 mg/dL — AB (ref 5–40)
VLDL Cholesterol Cal: 43 mg/dL — ABNORMAL HIGH (ref 5–40)

## 2016-03-31 LAB — TSH: TSH: 3.749 m(IU)/L (ref 0.308–3.960)

## 2016-04-03 ENCOUNTER — Ambulatory Visit (INDEPENDENT_AMBULATORY_CARE_PROVIDER_SITE_OTHER): Payer: BC Managed Care – PPO | Admitting: Gynecology

## 2016-04-03 ENCOUNTER — Encounter: Payer: Self-pay | Admitting: Gynecology

## 2016-04-03 VITALS — BP 120/70 | Ht 64.0 in | Wt 149.0 lb

## 2016-04-03 DIAGNOSIS — C50911 Malignant neoplasm of unspecified site of right female breast: Secondary | ICD-10-CM

## 2016-04-03 DIAGNOSIS — Z01419 Encounter for gynecological examination (general) (routine) without abnormal findings: Secondary | ICD-10-CM

## 2016-04-03 DIAGNOSIS — M899 Disorder of bone, unspecified: Secondary | ICD-10-CM | POA: Diagnosis not present

## 2016-04-03 NOTE — Progress Notes (Signed)
    Rachel [name] [facility] 1965/10/10 [id number]        51 y.o.  G2P2002 for annual exam.    Past medical history,surgical history, problem list, medications, allergies, family history and social history were all reviewed and documented as reviewed in the EPIC chart.  ROS:  Performed with pertinent positives and negatives included in the history, assessment and plan.   Additional significant findings :  None   Exam: Rachel [name] assistant Vitals:   04/03/16 1606  BP: 120/70  Weight: 149 lb (67.6 kg)  Height: 5\' 4"  (1.626 m)   Body mass index is 25.58 kg/m.  General appearance:  Normal affect, orientation and appearance. Skin: Grossly normal HEENT: Without gross lesions.  No cervical or supraclavicular adenopathy. Thyroid normal.  Lungs:  Clear without wheezing, rales or rhonchi Cardiac: RR, without RMG Abdominal:  Soft, nontender, without masses, guarding, rebound, organomegaly or hernia Breasts:  Examined lying and sitting without masses, retractions, discharge or axillary adenopathy. Well-healed right lumpectomy scar  Pelvic:  Ext, BUS, Vagina: With atrophic changes  Cervix: With atrophic changes  Uterus: Anteverted, normal size, shape and contour, midline and mobile nontender   Adnexa: Without masses or tenderness    Anus and perineum: Normal   Rectovaginal: Normal sphincter tone without palpated masses or tenderness.    Assessment/Plan:  51 y.o. G72P2002 female for annual exam.   1. Postmenopausal. Remains without menses. [name] 104 at last check. No significant hot flushes, night sweats, vaginal dryness or any bleeding. Follow up if any issues or bleeding. 2. Breast cancer, right 2001. Exam NED. Mammography 11/2015. Continue with annual mammography. Continue follow up with her oncologist. 3. Pap smear/HPV 2015. No Pap smear done today. No history of significant abnormal Pap smears. Plan repeat Pap smear at 5 year interval per current screening guidelines. 4. Osteoporosis. DEXA 08/2014  T score -2.5. Follow up DEXA end of this year at 2 year interval. Discussion reference or osteoporosis noted in her 03/29/2015 note. 5. Health maintenance. No routine lab work done as this is done elsewhere. Follow up for bone density and of this year. Follow up for annual exam in one year.  [name] MD, 4:40 PM 04/03/2016

## 2016-04-03 NOTE — Patient Instructions (Signed)
Follow up for bone density in the fall.  You may obtain a copy of any labs that were done today by logging onto MyChart as outlined in the instructions provided with your AVS (after visit summary). The office will not call with normal lab results but certainly if there are any significant abnormalities then we will contact you.   Health Maintenance Adopting a healthy lifestyle and getting preventive care can go a long way to promote health and wellness. Talk with your health care provider about what schedule of regular examinations is right for you. This is a good chance for you to check in with your provider about disease prevention and staying healthy. In between checkups, there are plenty of things you can do on your own. Experts have done a lot of research about which lifestyle changes and preventive measures are most likely to keep you healthy. Ask your health care provider for more information. WEIGHT AND DIET  Eat a healthy diet  Be sure to include plenty of vegetables, fruits, low-fat dairy products, and lean protein.  Do not eat a lot of foods high in solid fats, added sugars, or salt.  Get regular exercise. This is one of the most important things you can do for your health.  Most adults should exercise for at least 150 minutes each week. The exercise should increase your heart rate and make you sweat (moderate-intensity exercise).  Most adults should also do strengthening exercises at least twice a week. This is in addition to the moderate-intensity exercise.  Maintain a healthy weight  Body mass index (BMI) is a measurement that can be used to identify possible weight problems. It estimates body fat based on height and weight. Your health care provider can help determine your BMI and help you achieve or maintain a healthy weight.  For females 50 years of age and older:   A BMI below 18.5 is considered underweight.  A BMI of 18.5 to 24.9 is normal.  A BMI of 25 to 29.9 is  considered overweight.  A BMI of 30 and above is considered obese.  Watch levels of cholesterol and blood lipids  You should start having your blood tested for lipids and cholesterol at 50 years of age, then have this test every 5 years.  You may need to have your cholesterol levels checked more often if:  Your lipid or cholesterol levels are high.  You are older than 50 years of age.  You are at high risk for heart disease.  CANCER SCREENING   Lung Cancer  Lung cancer screening is recommended for adults 50-64 years old who are at high risk for lung cancer because of a history of smoking.  A yearly low-dose CT scan of the lungs is recommended for people who:  Currently smoke.  Have quit within the past 15 years.  Have at least a 30-pack-year history of smoking. A pack year is smoking an average of one pack of cigarettes a day for 1 year.  Yearly screening should continue until it has been 15 years since you quit.  Yearly screening should stop if you develop a health problem that would prevent you from having lung cancer treatment.  Breast Cancer  Practice breast self-awareness. This means understanding how your breasts normally appear and feel.  It also means doing regular breast self-exams. Let your health care provider know about any changes, no matter how small.  If you are in your 50s or 30s, you should have a clinical breast  exam (CBE) by a health care provider every 1-3 years as part of a regular health exam.  If you are 50 or older, have a CBE every year. Also consider having a breast X-ray (mammogram) every year.  If you have a family history of breast cancer, talk to your health care provider about genetic screening.  If you are at high risk for breast cancer, talk to your health care provider about having an MRI and a mammogram every year.  Breast cancer gene (BRCA) assessment is recommended for women who have family members with BRCA-related cancers.  BRCA-related cancers include:  Breast.  Ovarian.  Tubal.  Peritoneal cancers.  Results of the assessment will determine the need for genetic counseling and BRCA1 and BRCA2 testing. Cervical Cancer Routine pelvic examinations to screen for cervical cancer are no longer recommended for nonpregnant women who are considered low risk for cancer of the pelvic organs (ovaries, uterus, and vagina) and who do not have symptoms. A pelvic examination may be necessary if you have symptoms including those associated with pelvic infections. Ask your health care provider if a screening pelvic exam is right for you.   The Pap test is the screening test for cervical cancer for women who are considered at risk.  If you had a hysterectomy for a problem that was not cancer or a condition that could lead to cancer, then you no longer need Pap tests.  If you are older than 65 years, and you have had normal Pap tests for the past 10 years, you no longer need to have Pap tests.  If you have had past treatment for cervical cancer or a condition that could lead to cancer, you need Pap tests and screening for cancer for at least 20 years after your treatment.  If you no longer get a Pap test, assess your risk factors if they change (such as having a new sexual partner). This can affect whether you should start being screened again.  Some women have medical problems that increase their chance of getting cervical cancer. If this is the case for you, your health care provider may recommend more frequent screening and Pap tests.  The human papillomavirus (HPV) test is another test that may be used for cervical cancer screening. The HPV test looks for the virus that can cause cell changes in the cervix. The cells collected during the Pap test can be tested for HPV.  The HPV test can be used to screen women 50 years of age and older. Getting tested for HPV can extend the interval between normal Pap tests from three to  five years.  An HPV test also should be used to screen women of any age who have unclear Pap test results.  After 50 years of age, women should have HPV testing as often as Pap tests.  Colorectal Cancer  This type of cancer can be detected and often prevented.  Routine colorectal cancer screening usually begins at 50 years of age and continues through 50 years of age.  Your health care provider may recommend screening at an earlier age if you have risk factors for colon cancer.  Your health care provider may also recommend using home test kits to check for hidden blood in the stool.  A small camera at the end of a tube can be used to examine your colon directly (sigmoidoscopy or colonoscopy). This is done to check for the earliest forms of colorectal cancer.  Routine screening usually begins at age 26.  Direct examination of the colon should be repeated every 5-10 years through 50 years of age. However, you may need to be screened more often if early forms of precancerous polyps or small growths are found. Skin Cancer  Check your skin from head to toe regularly.  Tell your health care provider about any new moles or changes in moles, especially if there is a change in a mole's shape or color.  Also tell your health care provider if you have a mole that is larger than the size of a pencil eraser.  Always use sunscreen. Apply sunscreen liberally and repeatedly throughout the day.  Protect yourself by wearing long sleeves, pants, a wide-brimmed hat, and sunglasses whenever you are outside. HEART DISEASE, DIABETES, AND HIGH BLOOD PRESSURE   Have your blood pressure checked at least every 1-2 years. High blood pressure causes heart disease and increases the risk of stroke.  If you are between 76 years and 55 years old, ask your health care provider if you should take aspirin to prevent strokes.  Have regular diabetes screenings. This involves taking a blood sample to check your  fasting blood sugar level.  If you are at a normal weight and have a low risk for diabetes, have this test once every three years after 51 years of age.  If you are overweight and have a high risk for diabetes, consider being tested at a younger age or more often. PREVENTING INFECTION  Hepatitis B  If you have a higher risk for hepatitis B, you should be screened for this virus. You are considered at high risk for hepatitis B if:  You were born in a country where hepatitis B is common. Ask your health care provider which countries are considered high risk.  Your parents were born in a high-risk country, and you have not been immunized against hepatitis B (hepatitis B vaccine).  You have HIV or AIDS.  You use needles to inject street drugs.  You live with someone who has hepatitis B.  You have had sex with someone who has hepatitis B.  You get hemodialysis treatment.  You take certain medicines for conditions, including cancer, organ transplantation, and autoimmune conditions. Hepatitis C  Blood testing is recommended for:  Everyone born from [birth year] through 1964.  Anyone with known risk factors for hepatitis C. Sexually transmitted infections (STIs)  You should be screened for sexually transmitted infections (STIs) including gonorrhea and chlamydia if:  You are sexually active and are younger than 50 years of age.  You are older than 50 years of age and your health care provider tells you that you are at risk for this type of infection.  Your sexual activity has changed since you were last screened and you are at an increased risk for chlamydia or gonorrhea. Ask your health care provider if you are at risk.  If you do not have HIV, but are at risk, it may be recommended that you take a prescription medicine daily to prevent HIV infection. This is called pre-exposure prophylaxis (PrEP). You are considered at risk if:  You are sexually active and do not regularly use condoms or  know the HIV status of your partner(s).  You take drugs by injection.  You are sexually active with a partner who has HIV. Talk with your health care provider about whether you are at high risk of being infected with HIV. If you choose to begin PrEP, you should first be tested for HIV. You should then be  tested every 3 months for as long as you are taking PrEP.  PREGNANCY   If you are premenopausal and you may become pregnant, ask your health care provider about preconception counseling.  If you may become pregnant, take 400 to 800 micrograms (mcg) of folic acid every day.  If you want to prevent pregnancy, talk to your health care provider about birth control (contraception). OSTEOPOROSIS AND MENOPAUSE   Osteoporosis is a disease in which the bones lose minerals and strength with aging. This can result in serious bone fractures. Your risk for osteoporosis can be identified using a bone density scan.  If you are 25 years of age or older, or if you are at risk for osteoporosis and fractures, ask your health care provider if you should be screened.  Ask your health care provider whether you should take a calcium or vitamin D supplement to lower your risk for osteoporosis.  Menopause may have certain physical symptoms and risks.  Hormone replacement therapy may reduce some of these symptoms and risks. Talk to your health care provider about whether hormone replacement therapy is right for you.  HOME CARE INSTRUCTIONS   Schedule regular health, dental, and eye exams.  Stay current with your immunizations.   Do not use any tobacco products including cigarettes, chewing tobacco, or electronic cigarettes.  If you are pregnant, do not drink alcohol.  If you are breastfeeding, limit how much and how often you drink alcohol.  Limit alcohol intake to no more than 1 drink per day for nonpregnant women. One drink equals 12 ounces of beer, 5 ounces of wine, or 1 ounces of hard liquor.  Do  not use street drugs.  Do not share needles.  Ask your health care provider for help if you need support or information about quitting drugs.  Tell your health care provider if you often feel depressed.  Tell your health care provider if you have ever been abused or do not feel safe at home. Document Released: 08/12/2010 Document Revised: 06/13/2013 Document Reviewed: 12/29/2012 [facility] Patient Information 2015 [place], [place]. This information is not intended to replace advice given to you by your health care provider. Make sure you discuss any questions you have with your health care provider.

## 2016-05-06 ENCOUNTER — Encounter: Payer: Self-pay | Admitting: Family Medicine

## 2016-05-06 ENCOUNTER — Ambulatory Visit (INDEPENDENT_AMBULATORY_CARE_PROVIDER_SITE_OTHER): Payer: BC Managed Care – PPO | Admitting: Family Medicine

## 2016-05-06 VITALS — BP 118/74 | HR 60 | Temp 97.7°F | Resp 14 | Ht 64.0 in | Wt 150.0 lb

## 2016-05-06 DIAGNOSIS — J019 Acute sinusitis, unspecified: Secondary | ICD-10-CM | POA: Diagnosis not present

## 2016-05-06 DIAGNOSIS — B9689 Other specified bacterial agents as the cause of diseases classified elsewhere: Secondary | ICD-10-CM | POA: Diagnosis not present

## 2016-05-06 MED ORDER — AMOXICILLIN 875 MG PO TABS: 875.0000 mg | ORAL_TABLET | Freq: Two times a day (BID) | ORAL | 0 refills | Status: DC

## 2016-05-06 NOTE — Progress Notes (Signed)
Subjective:    Patient ID: Rachel [name], female    DOB: 1965-02-14, 51 y.o.   MRN: [id number]  HPI  Patient is a very pleasant 51 year old white female who presents with 4 weeks of maxillary and frontal sinus tenderness to palpation, severe headaches, and purulent nasal discharge. She reports constant rhinorrhea.  She has tried nasal saline, sudafed, and flonase without relief.  Now she reporst pain and pressure in her left ear with swallowing.   Past Medical History:  Diagnosis Date  . ASCUS (atypical squamous cells of undetermined significance) on Pap smear 02/2006, 05/2006   NEG HPV ----- PAP 12/2007 WNL  . 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 Chiari malformation type I ([place])   . Hypercholesteremia   . Hypothyroidism   . Osteoporosis 08/2014    T score -2.5   Past Surgical History:  Procedure Laterality Date  . BREAST LUMPECTOMY    . CESAREAN SECTION    . COLPOSCOPY    . PELVIC LAPAROSCOPY    . TONSILLECTOMY     Current Outpatient Prescriptions on File Prior to Visit  Medication Sig Dispense Refill  . butalbital-acetaminophen-caffeine (FIORICET, ESGIC) 50-325-40 MG per tablet TAKE 1 TABLET BY MOUTH EVERY 6 HOURS AS NEEDED FOR HEADACHE 20 tablet 0  . Cholecalciferol (VITAMIN D PO) Take 2,000 Units by mouth.    . fluticasone (FLONASE) 50 MCG/ACT nasal spray INHALE 2 SPRASY IN EACH NOSTRIL ONCE DAILY 16 g 11  . levothyroxine (SYNTHROID, LEVOTHROID) 88 MCG tablet Take 1 tablet (88 mcg total) by mouth daily. 90 tablet 3  . promethazine (PHENERGAN) 25 MG tablet TAKE 1 TABLET BY MOUTH EVERY 8 HOURS AS NEEDED FOR NAUSEA/VOMITING 20 tablet 0  . rizatriptan (MAXALT) 10 MG tablet TAKE 1 TABLET BY MOUTH EVERY DAY AS NEEDED FOR MIGRANE 6 tablet 1   No current facility-administered medications on file prior to visit.    Allergies  Allergen Reactions  . Erythromycin Nausea And Vomiting   Social History   Social History  . Marital  status: Married    Spouse name: N/A  . Number of children: N/A  . Years of education: N/A   Occupational History  . Not on file.   Social History Main Topics  . Smoking status: Never Smoker  . Smokeless tobacco: Never Used     Comment: never used tobacco  . Alcohol use 0.0 oz/week     Comment: social  . Drug use: No  . Sexual activity: Yes    Birth control/ protection: Post-menopausal     Comment: 1st intercourse 51 yo-Fewer than 5 partners   Other Topics Concern  . Not on file   Social History Narrative  . No narrative on file     Review of Systems  All other systems reviewed and are negative.      Objective:   Physical Exam  HENT:  Right Ear: External ear normal.  Left Ear: External ear normal.  Nose: Mucosal edema and rhinorrhea present. Right sinus exhibits maxillary sinus tenderness and frontal sinus tenderness. Left sinus exhibits maxillary sinus tenderness and frontal sinus tenderness.  Mouth/Throat: Oropharynx is clear and moist. No oropharyngeal exudate.  Eyes: Conjunctivae are normal. Pupils are equal, round, and reactive to light.  Neck: Neck supple.  Cardiovascular: Normal rate, regular rhythm and normal heart sounds.   No murmur heard. Pulmonary/Chest: Effort normal and breath sounds normal.  Lymphadenopathy:    She has no  cervical adenopathy.  Vitals reviewed.         Assessment & Plan:  Acute bacterial rhinosinusitis - Plan: amoxicillin (AMOXIL) 875 MG tablet   I will treat the patient with amoxicillin 875 mg  by mouth twice a day for 10 days.

## 2016-05-15 NOTE — Progress Notes (Signed)
This encounter was created in error - please disregard.

## 2016-06-03 ENCOUNTER — Telehealth: Payer: Self-pay | Admitting: Family Medicine

## 2016-06-03 NOTE — Telephone Encounter (Signed)
Try prednisone taper pack.  

## 2016-06-03 NOTE — Telephone Encounter (Signed)
Patient is calling to say she took all of her antibiotics for her sinus infection, felt better for a couple of days but now she is feeling the same again, would like to know if she should come back in or get something else called in for her [phone] (M)

## 2016-06-04 MED ORDER — PREDNISONE 20 MG PO TABS: ORAL_TABLET | ORAL | 0 refills | Status: DC

## 2016-06-04 MED ORDER — CEFDINIR 300 MG PO CAPS: 300.0000 mg | ORAL_CAPSULE | Freq: Two times a day (BID) | ORAL | 0 refills | Status: DC

## 2016-06-04 NOTE — Telephone Encounter (Signed)
Also per Dr. Dennard Schaumann add Omnicef 300 bid for 10 days

## 2016-06-04 NOTE — Telephone Encounter (Signed)
Patient aware of providers recommendations and med sent to pharm 

## 2016-06-06 ENCOUNTER — Other Ambulatory Visit: Payer: Self-pay

## 2016-06-06 ENCOUNTER — Other Ambulatory Visit (INDEPENDENT_AMBULATORY_CARE_PROVIDER_SITE_OTHER): Payer: BC Managed Care – PPO

## 2016-06-06 DIAGNOSIS — E032 Hypothyroidism due to medicaments and other exogenous substances: Secondary | ICD-10-CM

## 2016-06-06 LAB — T4, FREE: FREE T4: 0.96 ng/dL (ref 0.60–1.60)

## 2016-06-06 LAB — T3, FREE: T3 FREE: 3.7 pg/mL (ref 2.3–4.2)

## 2016-06-06 LAB — TSH: TSH: 0.53 u[IU]/mL (ref 0.35–4.50)

## 2016-06-13 ENCOUNTER — Ambulatory Visit (INDEPENDENT_AMBULATORY_CARE_PROVIDER_SITE_OTHER): Payer: BC Managed Care – PPO | Admitting: Endocrinology

## 2016-06-13 ENCOUNTER — Encounter: Payer: Self-pay | Admitting: Endocrinology

## 2016-06-13 VITALS — BP 118/78 | HR 70 | Ht 64.0 in | Wt 149.4 lb

## 2016-06-13 DIAGNOSIS — E063 Autoimmune thyroiditis: Secondary | ICD-10-CM

## 2016-06-13 NOTE — Progress Notes (Signed)
Patient ID: Rachel [name], female   DOB: 07-21-1965, 51 y.o.   MRN: [id number]   Reason for Appointment:  Hypothyroidism, followup visit   History of Present Illness:   HYPOTHYROIDISM was first diagnosed in 1992  With a TSH of 51  Her thyroxine dosage has been typically difficult to regulate and requires fairly frequent adjustments of dosage despite her being compliant with brand name Synthroid. 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  Although her TSH level has been normal since her last change it has been fluctuating somewhat and inconsistent in the normal range  Wt Readings from Last 3 Encounters:  06/13/16 149 lb 6.4 oz (67.8 kg)  05/06/16 150 lb (68 kg)  04/03/16 149 lb (67.6 kg)    Lab results:  Lab Results  Component Value Date   TSH 0.53 06/06/2016   TSH 3.749 03/28/2016   TSH 1.79 02/07/2016   FREET4 0.96 06/06/2016   FREET4 1.04 12/05/2015   FREET4 1.18 05/24/2015     Allergies as of 06/13/2016    Reactions   Erythromycin Nausea And Vomiting      Medication List       Accurate as of 06/13/16  3:50 PM. Always use your most recent med list.          butalbital-acetaminophen-caffeine 50-325-40 MG tablet Commonly known as:  FIORICET, ESGIC TAKE 1 TABLET BY MOUTH EVERY 6 HOURS AS NEEDED FOR HEADACHE   cefdinir 300 MG capsule Commonly known as:  OMNICEF Take 1 capsule (300 mg total) by mouth 2 (two) times daily.   fluticasone 50 MCG/ACT nasal spray Commonly known as:  FLONASE INHALE 2 SPRASY IN EACH NOSTRIL ONCE DAILY   levothyroxine 88 MCG tablet Commonly known as:  SYNTHROID, LEVOTHROID Take 1 tablet (88 mcg total) by mouth daily.   promethazine 25 MG tablet Commonly known as:  PHENERGAN TAKE 1 TABLET BY MOUTH EVERY 8 HOURS AS NEEDED FOR NAUSEA/VOMITING   rizatriptan 10 MG tablet Commonly known as:  MAXALT TAKE 1 TABLET BY MOUTH EVERY DAY AS NEEDED FOR MIGRANE   VITAMIN D PO Take 2,000 Units by mouth.        Past Medical History:  Diagnosis Date  . ASCUS (atypical squamous cells of undetermined significance) on Pap smear 02/2006, 05/2006   NEG HPV ----- PAP 12/2007 WNL  . 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 Chiari malformation type I ([place])   . Hypercholesteremia   . Hypothyroidism   . Osteoporosis 08/2014    T score -2.5    Past Surgical History:  Procedure Laterality Date  . BREAST LUMPECTOMY    . CESAREAN SECTION    . COLPOSCOPY    . PELVIC LAPAROSCOPY    . TONSILLECTOMY      Family History  Problem Relation Age of Onset  . Diabetes Father     Social History:  reports that she has never smoked. She has never used smokeless tobacco. She reports that she drinks alcohol. She reports that she does not use drugs.  Allergies:  Allergies  Allergen Reactions  . Erythromycin Nausea And Vomiting   Review of systems:   History of osteopenia present  Hypercholesterolemia: Previous LDL 150, has  no other risk factors   Examination:   BP 118/78   Pulse 70   Ht 5\' 4"  (1.626 m)   Wt 149 lb 6.4 oz (67.8 kg)   SpO2 98%   BMI 25.64 kg/m   She looks well   Biceps reflexes Appear normal  Thyroid not palpable Skin appears normal.     Assessment    Hypothyroidism, long-standing with history of variability and TSH She is doing clinically fairly well with now taking levothyroxine 88 g, 6-1/2 tablets since 11/2015 Doing subjectively well without unusual fatigue since her last visit She is very compliant with her medication  TSH is now 0.53 but has been on either end of the normal range since her last dosage change and not consistent   Plan: Since TSH is still within the normal range she will continue the same dosage Follow-up in 6 months   Cledis Sohn 06/13/2016, 3:50 PM

## 2016-06-26 ENCOUNTER — Ambulatory Visit (INDEPENDENT_AMBULATORY_CARE_PROVIDER_SITE_OTHER): Payer: BC Managed Care – PPO | Admitting: Family Medicine

## 2016-06-26 ENCOUNTER — Encounter: Payer: Self-pay | Admitting: Family Medicine

## 2016-06-26 VITALS — BP 118/62 | HR 80 | Temp 98.0°F | Resp 16 | Ht 64.0 in | Wt 148.0 lb

## 2016-06-26 DIAGNOSIS — J320 Chronic maxillary sinusitis: Secondary | ICD-10-CM | POA: Diagnosis not present

## 2016-06-26 MED ORDER — LEVOFLOXACIN 500 MG PO TABS: 500.0000 mg | ORAL_TABLET | Freq: Every day | ORAL | 0 refills | Status: DC

## 2016-06-26 MED ORDER — PREDNISONE 20 MG PO TABS: 40.0000 mg | ORAL_TABLET | Freq: Every day | ORAL | 0 refills | Status: DC

## 2016-06-26 NOTE — Progress Notes (Signed)
Subjective:    Patient ID: Rachel [name], female    DOB: December 18, 1965, 51 y.o.   MRN: [id number]  HPI 3/18 Patient is a very pleasant 51 year old white female who presents with 4 weeks of maxillary and frontal sinus tenderness to palpation, severe headaches, and purulent nasal discharge. She reports constant rhinorrhea.  She has tried nasal saline, sudafed, and flonase without relief.  Now she reporst pain and pressure in her left ear with swallowing.  At that time, my plan was: I will treat the patient with amoxicillin 875 mg  by mouth twice a day for 10 days.  06/26/16 Ultimately, patient had to take [facility] in April along with prednisone as the symptoms did not improve. After completing Omnicef and prednisone, her symptoms went away for approximately 7-10 days although never went away completely. For the last 2 weeks, she reports pain and pressure in both maxillary sinuses, pain and pressure in both frontal sinuses, postnasal drip causing a sore throat, headaches, nonproductive cough.     Past Medical History:  Diagnosis Date  . ASCUS (atypical squamous cells of undetermined significance) on Pap smear 02/2006, 05/2006   NEG HPV ----- PAP 12/2007 WNL  . 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 Chiari malformation type I ([place])   . Hypercholesteremia   . Hypothyroidism   . Osteoporosis 08/2014    T score -2.5   Past Surgical History:  Procedure Laterality Date  . BREAST LUMPECTOMY    . CESAREAN SECTION    . COLPOSCOPY    . PELVIC LAPAROSCOPY    . TONSILLECTOMY     Current Outpatient Prescriptions on File Prior to Visit  Medication Sig Dispense Refill  . butalbital-acetaminophen-caffeine (FIORICET, ESGIC) 50-325-40 MG per tablet TAKE 1 TABLET BY MOUTH EVERY 6 HOURS AS NEEDED FOR HEADACHE 20 tablet 0  . cefdinir (OMNICEF) 300 MG capsule Take 1 capsule (300 mg total) by mouth 2 (two) times daily. 20 capsule 0  . Cholecalciferol (VITAMIN  D PO) Take 2,000 Units by mouth.    . fluticasone (FLONASE) 50 MCG/ACT nasal spray INHALE 2 SPRASY IN EACH NOSTRIL ONCE DAILY 16 g 11  . levothyroxine (SYNTHROID, LEVOTHROID) 88 MCG tablet Take 1 tablet (88 mcg total) by mouth daily. 90 tablet 3  . promethazine (PHENERGAN) 25 MG tablet TAKE 1 TABLET BY MOUTH EVERY 8 HOURS AS NEEDED FOR NAUSEA/VOMITING 20 tablet 0  . rizatriptan (MAXALT) 10 MG tablet TAKE 1 TABLET BY MOUTH EVERY DAY AS NEEDED FOR MIGRANE 6 tablet 1   No current facility-administered medications on file prior to visit.    Allergies  Allergen Reactions  . Erythromycin Nausea And Vomiting   Social History   Social History  . Marital status: Married    Spouse name: N/A  . Number of children: N/A  . Years of education: N/A   Occupational History  . Not on file.   Social History Main Topics  . Smoking status: Never Smoker  . Smokeless tobacco: Never Used     Comment: never used tobacco  . Alcohol use 0.0 oz/week     Comment: social  . Drug use: No  . Sexual activity: Yes    Birth control/ protection: Post-menopausal     Comment: 1st intercourse 51 yo-Fewer than 5 partners   Other Topics Concern  . Not on file   Social History Narrative  . No narrative on file     Review of Systems  All other systems reviewed and are negative.      Objective:   Physical Exam  HENT:  Right Ear: External ear normal.  Left Ear: External ear normal.  Nose: Mucosal edema and rhinorrhea present. Right sinus exhibits maxillary sinus tenderness and frontal sinus tenderness. Left sinus exhibits maxillary sinus tenderness and frontal sinus tenderness.  Mouth/Throat: Oropharynx is clear and moist. No oropharyngeal exudate.  Eyes: Conjunctivae are normal. Pupils are equal, round, and reactive to light.  Neck: Neck supple.  Cardiovascular: Normal rate, regular rhythm and normal heart sounds.   No murmur heard. Pulmonary/Chest: Effort normal and breath sounds normal.    Lymphadenopathy:    She has no cervical adenopathy.  Vitals reviewed.         Assessment & Plan:  Chronic maxillary sinusitis - Plan: levofloxacin (LEVAQUIN) 500 MG tablet, predniSONE (DELTASONE) 20 MG tablet  I believe the patient has been dealing with chronic maxillary sinusitis now for almost 2 months intermittently. Symptoms have waxed and waned but never completely resolved. She is on the appropriate preventative medication including Flonase and Zyrtec without benefit. I will treat the patient with Levaquin 500 mg a day for 7 days and prednisone 40 mg a day for 7 days. If symptoms do not improve, I'll proceed with a CT scan of the sinuses to determine if the patient needs an ENT referral. Even if symptoms improve, I would recommend a referral to an allergist over the summer for formal allergy testing off antihistamines. I believe that allergies are this patient's trigger and I believe she would benefit from allergy shots.

## 2016-09-11 ENCOUNTER — Encounter: Payer: Self-pay | Admitting: Gynecology

## 2016-09-11 ENCOUNTER — Ambulatory Visit (INDEPENDENT_AMBULATORY_CARE_PROVIDER_SITE_OTHER): Payer: BC Managed Care – PPO

## 2016-09-11 ENCOUNTER — Other Ambulatory Visit: Payer: Self-pay | Admitting: Gynecology

## 2016-09-11 DIAGNOSIS — M810 Age-related osteoporosis without current pathological fracture: Secondary | ICD-10-CM

## 2016-09-11 DIAGNOSIS — M899 Disorder of bone, unspecified: Secondary | ICD-10-CM

## 2016-09-26 ENCOUNTER — Ambulatory Visit (HOSPITAL_BASED_OUTPATIENT_CLINIC_OR_DEPARTMENT_OTHER): Payer: BC Managed Care – PPO | Admitting: Hematology & Oncology

## 2016-09-26 ENCOUNTER — Other Ambulatory Visit (HOSPITAL_BASED_OUTPATIENT_CLINIC_OR_DEPARTMENT_OTHER): Payer: BC Managed Care – PPO

## 2016-09-26 ENCOUNTER — Telehealth: Payer: Self-pay | Admitting: *Deleted

## 2016-09-26 VITALS — BP 112/61 | HR 54 | Temp 98.7°F | Resp 18 | Wt 150.0 lb

## 2016-09-26 DIAGNOSIS — E7800 Pure hypercholesterolemia, unspecified: Secondary | ICD-10-CM

## 2016-09-26 DIAGNOSIS — M810 Age-related osteoporosis without current pathological fracture: Secondary | ICD-10-CM | POA: Diagnosis not present

## 2016-09-26 DIAGNOSIS — C50611 Malignant neoplasm of axillary tail of right female breast: Secondary | ICD-10-CM

## 2016-09-26 DIAGNOSIS — Z171 Estrogen receptor negative status [ER-]: Principal | ICD-10-CM

## 2016-09-26 DIAGNOSIS — E032 Hypothyroidism due to medicaments and other exogenous substances: Secondary | ICD-10-CM

## 2016-09-26 DIAGNOSIS — R635 Abnormal weight gain: Secondary | ICD-10-CM | POA: Diagnosis not present

## 2016-09-26 DIAGNOSIS — Z853 Personal history of malignant neoplasm of breast: Secondary | ICD-10-CM | POA: Diagnosis not present

## 2016-09-26 LAB — CMP (CANCER CENTER ONLY)
ALBUMIN: 3.9 g/dL (ref 3.3–5.5)
ALK PHOS: 80 U/L (ref 26–84)
ALT: 35 U/L (ref 10–47)
AST: 31 U/L (ref 11–38)
BILIRUBIN TOTAL: 0.8 mg/dL (ref 0.20–1.60)
BUN, Bld: 14 mg/dL (ref 7–22)
CALCIUM: 9.4 mg/dL (ref 8.0–10.3)
CO2: 30 mEq/L (ref 18–33)
CREATININE: 1 mg/dL (ref 0.6–1.2)
Chloride: 104 mEq/L (ref 98–108)
Glucose, Bld: 108 mg/dL (ref 73–118)
Potassium: 3.8 mEq/L (ref 3.3–4.7)
Sodium: 140 mEq/L (ref 128–145)
Total Protein: 7.2 g/dL (ref 6.4–8.1)

## 2016-09-26 LAB — LIPID PANEL
Chol/HDL Ratio: 5.3 ratio — ABNORMAL HIGH (ref 0.0–4.4)
Cholesterol, Total: 242 mg/dL — ABNORMAL HIGH (ref 100–199)
HDL: 46 mg/dL (ref >39)
LDL Calculated: 173 mg/dL — ABNORMAL HIGH (ref 0–99)
TRIGLYCERIDES: 115 mg/dL (ref 0–149)
VLDL Cholesterol Cal: 23 mg/dL (ref 5–40)

## 2016-09-26 LAB — CBC WITH DIFFERENTIAL (CANCER CENTER ONLY)
BASO#: 0 10*3/uL (ref 0.0–0.2)
BASO%: 0.5 % (ref 0.0–2.0)
EOS%: 2.7 % (ref 0.0–7.0)
Eosinophils Absolute: 0.2 10*3/uL (ref 0.0–0.5)
HEMATOCRIT: 41.6 % (ref 34.8–46.6)
HEMOGLOBIN: 14.1 g/dL (ref 11.6–15.9)
LYMPH#: 2.4 10*3/uL (ref 0.9–3.3)
LYMPH%: 42.5 % (ref 14.0–48.0)
MCH: 31.1 pg (ref 26.0–34.0)
MCHC: 33.9 g/dL (ref 32.0–36.0)
MCV: 92 fL (ref 81–101)
MONO#: 0.5 10*3/uL (ref 0.1–0.9)
MONO%: 9.2 % (ref 0.0–13.0)
NEUT%: 45.1 % (ref 39.6–80.0)
NEUTROS ABS: 2.5 10*3/uL (ref 1.5–6.5)
Platelets: 211 10*3/uL (ref 145–400)
RBC: 4.54 10*6/uL (ref 3.70–5.32)
RDW: 13 % (ref 11.1–15.7)
WBC: 5.6 10*3/uL (ref 3.9–10.0)

## 2016-09-26 LAB — TSH: TSH: 1.807 m[IU]/L (ref 0.308–3.960)

## 2016-09-26 NOTE — Telephone Encounter (Addendum)
Message left on personal voice mail  ----- Message from Volanda Napoleon, MD sent at 09/26/2016 11:42 AM EDT ----- Call - thyroid is ok!!  pete

## 2016-09-26 NOTE — Progress Notes (Signed)
Hematology and Oncology Follow Up Visit  Rachel [name] [facility] [id number] [birth year] 51 y.o. 09/26/2016   Principle Diagnosis:  Stage I (T1 N0M0) infiltrating ductal carcinoma the right breast  Current Therapy:    Observation     Interim History:  [name] is back for followup. She is doing great. The big news is that she will be a grandmother. Her oldest son and his wife will have a baby in March. In reality, she and I had a "sidebet" as to his child will have a baby first.  Health wise, she is doing okay. She has gained a little weight. I think she is on thyroid medicine.  She's had no problems with pain. She had a recent bone density test which did show some osteoporosis. She is on vitamin D.  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 She has been in the [employer] [employer] system for 30 years. She will retire next year.  She did have a mammogram done. This was back in October. Everything looked fine. She will have a another one this October.   We still have to work on doing the genetic counseling.   There's been no change in bowel or bladder habits. She is having hot flashes.    Overall, her performance status is ECOG 0.   Medications:  Current Outpatient Prescriptions:  .  butalbital-acetaminophen-caffeine (FIORICET, ESGIC) 50-325-40 MG per tablet, TAKE 1 TABLET BY MOUTH EVERY 6 HOURS AS NEEDED FOR HEADACHE, Disp: 20 tablet, Rfl: 0 .  Cholecalciferol (VITAMIN D PO), Take 2,000 Units by mouth., Disp: , Rfl:  .  fluticasone (FLONASE) 50 MCG/ACT nasal spray, INHALE 2 SPRASY IN EACH NOSTRIL ONCE DAILY, Disp: 16 g, Rfl: 11 .  levothyroxine (SYNTHROID, LEVOTHROID) 88 MCG tablet, Take 1 tablet (88 mcg total) by mouth daily., Disp: 90 tablet, Rfl: 3 .  predniSONE (DELTASONE) 20 MG tablet, Take 2 tablets (40 mg total) by mouth daily with breakfast., Disp: 14 tablet, Rfl: 0 .  promethazine (PHENERGAN) 25 MG tablet, TAKE 1 TABLET BY MOUTH EVERY 8 HOURS AS NEEDED FOR NAUSEA/VOMITING,  Disp: 20 tablet, Rfl: 0 .  rizatriptan (MAXALT) 10 MG tablet, TAKE 1 TABLET BY MOUTH EVERY DAY AS NEEDED FOR MIGRANE, Disp: 6 tablet, Rfl: 1  Allergies:  Allergies  Allergen Reactions  . Erythromycin Nausea And Vomiting    Past Medical History, Surgical history, Social history, and Family History were reviewed and updated.  Review of Systems: As above  Physical Exam:  weight is 150 lb (68 kg). Her oral temperature is 98.7 F (37.1 C). Her blood pressure is 112/61 and her pulse is 54 (abnormal). Her respiration is 18 and oxygen saturation is 98%.   Well-developed and well-nourished white female. Head and neck exam shows no ocular or oral lesions. She has no palpable cervical or supraclavicular lymph nodes. Lungs are clear bilaterally. Cardiac exam regular rate and rhythm with no murmurs rubs or bruits. Abdomen is soft. Has good bowel sounds. There is no fluid wave. There is no palpable liver or spleen tip. Breast exam shows left breast no masses edema or erythema. There is no left axillary adenopathy. Right breast is slightly contracted from surgery and radiation. She has a well-healed lumpectomy at the 10:00 position. No distinct mass is noted in the right breast. There is no right axillary adenopathy. Back exam shows no tenderness over the spine ribs or hips. Extremities shows no clubbing cyanosis or edema. Skin exam no rashes, ecchymosis or petechia. Neurological  exam is nonfocal.  Lab Results  Component Value Date   WBC 5.6 09/26/2016   HGB 14.1 09/26/2016   HCT 41.6 09/26/2016   MCV 92 09/26/2016   PLT 211 09/26/2016     Chemistry      Component Value Date/Time   NA 140 09/26/2016 0743   NA 143 09/21/2015 0854   K 3.8 09/26/2016 0743   K 4.4 09/21/2015 0854   CL 104 09/26/2016 0743   CO2 30 09/26/2016 0743   CO2 28 09/21/2015 0854   BUN 14 09/26/2016 0743   BUN 13.9 09/21/2015 0854   CREATININE 1.0 09/26/2016 0743   CREATININE 0.8 09/21/2015 0854      Component Value  Date/Time   CALCIUM 9.4 09/26/2016 0743   CALCIUM 10.1 09/21/2015 0854   ALKPHOS 80 09/26/2016 0743   ALKPHOS 98 09/21/2015 0854   AST 31 09/26/2016 0743   AST 37 (H) 09/21/2015 0854   ALT 35 09/26/2016 0743   ALT 57 (H) 09/21/2015 0854   BILITOT 0.80 09/26/2016 0743   BILITOT 0.53 09/21/2015 0854        Impression and Plan: Rachel [name] is a 51 year old white female with a history of stage I ductal carcinoma of the right breast. She was diagnosed 15 years ago. 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 I would think that  she is cured.  We will need to get her set up for genetic counseling. Apparently, this is the only way we can get the BRCA test done. I think this is important for her. She has 2 sons. She does not have a daughters but I think results from the genetic analysis will help her boys.  I will definitely pray for her father-in-law. We took care of him with head and neck cancer. He is still having a very hard time recovering. He still has a very hard time swallowing. Hopefully, he is not had damage from radiation with his swallowing.  I will plan to see her back in another 6 months.   I'm so happy that she will be a grandmother early next year.  Volanda Napoleon, MD 8/17/20189:11 AM

## 2016-09-27 LAB — VITAMIN D 25 HYDROXY (VIT D DEFICIENCY, FRACTURES): Vitamin D, 25-Hydroxy: 44 ng/mL (ref 30.0–100.0)

## 2016-09-29 ENCOUNTER — Telehealth: Payer: Self-pay | Admitting: *Deleted

## 2016-09-29 NOTE — Telephone Encounter (Signed)
-----   Message from Volanda Napoleon, MD sent at 09/29/2016  6:34 AM EDT ----- Call - vit D is good!! The cholesterol is awful!!!  Need to send this to her family MD.  Dennis Bast WILL need to be put on something for the cholesterol!!!  We do not want grannys to have heart attacks!!  pete

## 2016-10-01 ENCOUNTER — Other Ambulatory Visit: Payer: Self-pay | Admitting: Hematology & Oncology

## 2016-10-01 ENCOUNTER — Ambulatory Visit (HOSPITAL_COMMUNITY)
Admission: RE | Admit: 2016-10-01 | Discharge: 2016-10-01 | Disposition: A | Discharge status: Home / Self Care | Payer: BC Managed Care – PPO | Source: Ambulatory Visit | Attending: Hematology & Oncology | Admitting: Hematology & Oncology

## 2016-10-01 DIAGNOSIS — K219 Gastro-esophageal reflux disease without esophagitis: Secondary | ICD-10-CM | POA: Diagnosis not present

## 2016-10-01 DIAGNOSIS — Z171 Estrogen receptor negative status [ER-]: Secondary | ICD-10-CM | POA: Insufficient documentation

## 2016-10-01 DIAGNOSIS — K449 Diaphragmatic hernia without obstruction or gangrene: Secondary | ICD-10-CM | POA: Diagnosis not present

## 2016-10-01 DIAGNOSIS — C50611 Malignant neoplasm of axillary tail of right female breast: Secondary | ICD-10-CM

## 2016-10-01 MED ORDER — MAGNESIUM HYDROXIDE 400 MG/5ML PO SUSP
ORAL | Status: AC
Start: 2016-10-01 — End: 2016-10-01
  Filled 2016-10-01: qty 30

## 2016-10-03 ENCOUNTER — Ambulatory Visit: Payer: BC Managed Care – PPO | Admitting: Hematology & Oncology

## 2016-10-03 ENCOUNTER — Other Ambulatory Visit: Payer: BC Managed Care – PPO

## 2016-10-07 ENCOUNTER — Telehealth: Payer: Self-pay | Admitting: *Deleted

## 2016-10-07 NOTE — Telephone Encounter (Addendum)
Patient is aware of results  ----- Message from Volanda Napoleon, MD sent at 10/07/2016  6:58 AM EDT ----- Call - you do have some reflux and a small hiatal hernia.  I will make a referral to gastroenterology so they can try to help!!  We want the new granny to be healthy!!!  pete

## 2016-11-13 ENCOUNTER — Telehealth: Payer: Self-pay | Admitting: *Deleted

## 2016-11-13 ENCOUNTER — Encounter: Payer: Self-pay | Admitting: Gynecology

## 2016-11-13 ENCOUNTER — Ambulatory Visit (INDEPENDENT_AMBULATORY_CARE_PROVIDER_SITE_OTHER): Payer: BC Managed Care – PPO | Admitting: Gynecology

## 2016-11-13 VITALS — BP 118/78

## 2016-11-13 DIAGNOSIS — N898 Other specified noninflammatory disorders of vagina: Secondary | ICD-10-CM

## 2016-11-13 DIAGNOSIS — R350 Frequency of micturition: Secondary | ICD-10-CM

## 2016-11-13 LAB — WET PREP FOR TRICH, YEAST, CLUE

## 2016-11-13 MED ORDER — METRONIDAZOLE 500 MG PO TABS: 500.0000 mg | ORAL_TABLET | Freq: Two times a day (BID) | ORAL | 0 refills | Status: DC

## 2016-11-13 NOTE — Telephone Encounter (Signed)
Will have front desk schedule.

## 2016-11-13 NOTE — Telephone Encounter (Signed)
Pt called c/o vaginal burning, burning with urination, increased frequent urination, vaginal itching. Would you like to work patient in to be seen today? Please advise

## 2016-11-13 NOTE — Progress Notes (Signed)
    Rachel [name] [facility] 20-Jun-1965 [id number]        51 y.o.  G2P2002 presents with 1-2 weeks of vaginal irritation and itching. Also notes some urinary frequency with dysuria. No real urgency. No significant discharge or vaginal odor. No fever or chills. No nausea vomiting diarrhea constipation.  Past medical history,surgical history, problem list, medications, allergies, family history and social history were all reviewed and documented in the EPIC chart.  Directed ROS with pertinent positives and negatives documented in the history of present illness/assessment and plan.  Exam: Caryn Bee assistant Vitals:   11/13/16 1514  BP: 118/78   General appearance:  Normal Spine straight without CVA tenderness Abdomen soft nontender without masses guarding rebound Pelvic external BUS vagina with scant discharge. Cervix normal. Uterus grossly normal midline mobile nontender. Adnexa without masses or tenderness.  Assessment/Plan:  51 y.o. [id number] with history and exam as above. Wet prep is unremarkable. Suspect low-grade bacterial vaginosis. Urinalysis is negative for UTI. Will cover with Flagyl 500 mg twice a day 7 days. Alcohol avoidance discussed. Will call me if her urinary symptoms persist despite this treatment and may consider urinary antibiotic but for now given the clear urine analysis will hold on treatment.    [name] MD, 3:56 PM 11/13/2016

## 2016-11-13 NOTE — Telephone Encounter (Signed)
office visit

## 2016-11-13 NOTE — Patient Instructions (Addendum)
Take the antibiotic twice daily for 7 days.  Avoid alcohol while taking. 

## 2016-11-13 NOTE — Telephone Encounter (Signed)
Coming at 3:05pm

## 2016-11-13 NOTE — Addendum Note (Signed)
Addended by: Nelva Nay on: 11/13/2016 04:08 PM   Modules accepted: Orders

## 2016-11-14 ENCOUNTER — Other Ambulatory Visit: Payer: Self-pay | Admitting: Hematology & Oncology

## 2016-11-14 DIAGNOSIS — Z1231 Encounter for screening mammogram for malignant neoplasm of breast: Secondary | ICD-10-CM

## 2016-11-14 LAB — URINALYSIS W MICROSCOPIC + REFLEX CULTURE
Bacteria, UA: NONE SEEN /HPF
Bilirubin Urine: NEGATIVE
Glucose, UA: NEGATIVE
HGB URINE DIPSTICK: NEGATIVE
HYALINE CAST: NONE SEEN /LPF
Ketones, ur: NEGATIVE
LEUKOCYTE ESTERASE: NEGATIVE
Nitrites, Initial: NEGATIVE
PH: 6 (ref 5.0–8.0)
Protein, ur: NEGATIVE
RBC / HPF: NONE SEEN /HPF (ref 0–2)
SPECIFIC GRAVITY, URINE: 1.015 (ref 1.001–1.03)
WBC UA: NONE SEEN /HPF (ref 0–5)

## 2016-11-14 LAB — NO CULTURE INDICATED

## 2016-11-27 ENCOUNTER — Ambulatory Visit
Admission: RE | Admit: 2016-11-27 | Discharge: 2016-11-27 | Disposition: A | Discharge status: Home / Self Care | Payer: BC Managed Care – PPO | Source: Ambulatory Visit | Attending: Hematology & Oncology | Admitting: Hematology & Oncology

## 2016-11-27 DIAGNOSIS — Z1231 Encounter for screening mammogram for malignant neoplasm of breast: Secondary | ICD-10-CM

## 2016-11-27 HISTORY — DX: Personal history of irradiation: Z92.3

## 2016-11-27 HISTORY — DX: Personal history of antineoplastic chemotherapy: Z92.21

## 2016-12-06 ENCOUNTER — Other Ambulatory Visit: Payer: Self-pay | Admitting: Endocrinology

## 2016-12-12 ENCOUNTER — Other Ambulatory Visit (INDEPENDENT_AMBULATORY_CARE_PROVIDER_SITE_OTHER): Payer: BC Managed Care – PPO

## 2016-12-12 DIAGNOSIS — E063 Autoimmune thyroiditis: Secondary | ICD-10-CM | POA: Diagnosis not present

## 2016-12-12 LAB — TSH: TSH: 6.13 u[IU]/mL — ABNORMAL HIGH (ref 0.35–4.50)

## 2016-12-22 ENCOUNTER — Ambulatory Visit: Payer: BC Managed Care – PPO | Admitting: Endocrinology

## 2016-12-22 ENCOUNTER — Encounter: Payer: Self-pay | Admitting: Endocrinology

## 2016-12-22 VITALS — BP 110/68 | HR 67 | Ht 64.0 in | Wt 150.6 lb

## 2016-12-22 DIAGNOSIS — E039 Hypothyroidism, unspecified: Secondary | ICD-10-CM | POA: Diagnosis not present

## 2016-12-22 MED ORDER — UNITHROID 100 MCG PO TABS: 100.0000 ug | ORAL_TABLET | Freq: Every day | ORAL | 3 refills | Status: DC

## 2016-12-22 NOTE — Progress Notes (Signed)
Patient ID: Rachel [name], female   DOB: [birth year], 51 y.o.   MRN: [id number]   Reason for Appointment:  Hypothyroidism, followup visit   History of Present Illness:   HYPOTHYROIDISM was first diagnosed in 1992  With a TSH of 51  Her thyroxine dosage has been typically difficult to regulate and requires fairly frequent adjustments of dosage despite her being compliant with brand name Synthroid. 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            TSH was low in the early part of 2017 and her dose has been gradually reduced, now taking 7 tablets per week of 88 g since 11/2015 She does not feel any different with this change The dose was continued unchanged on the last visit in 5/18; she also had a follow-up lab in August  More recently in the last month or so she has been feeling exhausted and drained although she was not sure whether this is related to her work schedule No change in her weight any significance She is noticing more hair loss also Tends to have mild chronic cold intolerance  Compliance with the medical regimen has been as prescribed with taking the tablet in the morning before breakfast. She has more recently been on levothyroxine Generic since Synthroid is not covered  Although her TSH level has been normal in August it is significantly higher at 6.1   Wt Readings from Last 3 Encounters:    12/22/16 150 lb 9.6 oz (68.3 kg)  09/26/16 150 lb (68 kg)  06/26/16 148 lb (67.1 kg)    Lab results:  Lab Results  Component Value Date   TSH 6.13 (H) 12/12/2016   TSH 1.807 09/26/2016   TSH 0.53 06/06/2016   FREET4 0.96 06/06/2016   FREET4 1.04 12/05/2015   FREET4 1.18 05/24/2015     Allergies as of 12/22/2016      Reactions   Erythromycin Nausea And Vomiting      Medication List        Accurate as of 12/22/16 10:51 AM. Always use your most recent med list.          butalbital-acetaminophen-caffeine 50-325-40 MG tablet Commonly known as:  FIORICET, ESGIC TAKE 1 TABLET BY MOUTH EVERY 6 HOURS AS NEEDED FOR HEADACHE   fluticasone 50 MCG/ACT nasal spray Commonly known as:  FLONASE INHALE 2 SPRASY IN EACH NOSTRIL ONCE DAILY   levothyroxine 88 MCG tablet Commonly known as:  SYNTHROID, LEVOTHROID TAKE 1 TABLET (88 MCG TOTAL) BY MOUTH DAILY.   promethazine 25 MG tablet Commonly known as:  PHENERGAN TAKE 1 TABLET BY MOUTH EVERY 8 HOURS AS NEEDED FOR NAUSEA/VOMITING   rizatriptan 10 MG tablet Commonly known as:  MAXALT TAKE 1 TABLET BY MOUTH EVERY DAY AS NEEDED FOR MIGRANE   VITAMIN D PO Take 2,000 Units by mouth.  Past Medical History:  Diagnosis Date  . ASCUS (atypical squamous cells of undetermined significance) on Pap smear 02/2006, 05/2006   NEG HPV ----- PAP 12/2007 WNL  . 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 Chiari malformation type I ([place])   . Hypercholesteremia   . Hypothyroidism   . Osteoporosis 09/2016   T score of -2.5 statistically significant increased right hip, stable left hip and AP spine  . Personal history of chemotherapy 2001  . Personal history of radiation therapy 2001    Past Surgical History:  Procedure Laterality Date  . BREAST EXCISIONAL BIOPSY Left   . BREAST LUMPECTOMY Right 2001  . CESAREAN SECTION    . COLPOSCOPY    . PELVIC LAPAROSCOPY    . TONSILLECTOMY       Family History  Problem Relation Age of Onset  . Diabetes Father     Social History:  reports that  has never smoked. she has never used smokeless tobacco. She reports that she drinks alcohol. She reports that she does not use drugs.  Allergies:  Allergies  Allergen Reactions  . Erythromycin Nausea And Vomiting    Review of systems:   History of osteopenia followed elsewhere  Hypercholesterolemia: LDL 173   Examination:   BP 110/68   Pulse 67   Ht 5\' 4"  (1.626 m)   Wt 150 lb 9.6 oz (68.3 kg)   SpO2 92%   BMI 25.85 kg/m   She looks well    Thyroid not palpable Biceps reflex shows slight delayed relaxation  Skin appears normal.     Assessment    Hypothyroidism, long-standing with history of variability in her TSH and has required various doses of the last several years  Although she was doing fairly well with a stable dose of 88 g levothyroxine for about a year she now appears to be more hypothyroid She is complaining of fatigue and hair loss also  She is very compliant with her medication Thyroid is not palpable  TSH is now 6.1, previously was only 0.53 but also was normal in August   Plan: Since TSH is significantly higher than usual she will go up to the 100 g dose Discussed that we should try her on a brand name medication and since Synthroid brand is not covered she can try Unithroid, co-pay card given If she is not able to get this right away she can take A half tablet twice a week of the 88 g  Follow-up in 6 weeks   Carlous Olivares 12/22/2016, 10:51 AM

## 2017-03-02 ENCOUNTER — Other Ambulatory Visit: Payer: Self-pay | Admitting: Family Medicine

## 2017-03-09 ENCOUNTER — Telehealth: Payer: Self-pay | Admitting: Endocrinology

## 2017-03-09 NOTE — Telephone Encounter (Signed)
Abby from [facility] is requesting a refill for pt, she need a verbal request  [phone]

## 2017-03-10 ENCOUNTER — Other Ambulatory Visit: Payer: Self-pay

## 2017-03-10 MED ORDER — UNITHROID 100 MCG PO TABS: 100.0000 ug | ORAL_TABLET | Freq: Every day | ORAL | 3 refills | Status: DC

## 2017-03-13 NOTE — Telephone Encounter (Signed)
This has been done.

## 2017-04-03 ENCOUNTER — Inpatient Hospital Stay (HOSPITAL_BASED_OUTPATIENT_CLINIC_OR_DEPARTMENT_OTHER): Payer: BC Managed Care – PPO | Admitting: Hematology & Oncology

## 2017-04-03 ENCOUNTER — Other Ambulatory Visit: Payer: Self-pay

## 2017-04-03 ENCOUNTER — Inpatient Hospital Stay: Payer: BC Managed Care – PPO | Attending: Hematology & Oncology

## 2017-04-03 ENCOUNTER — Encounter: Payer: Self-pay | Admitting: Hematology & Oncology

## 2017-04-03 VITALS — BP 125/63 | HR 65 | Temp 98.1°F | Resp 16 | Wt 149.0 lb

## 2017-04-03 DIAGNOSIS — N951 Menopausal and female climacteric states: Secondary | ICD-10-CM

## 2017-04-03 DIAGNOSIS — Z86000 Personal history of in-situ neoplasm of breast: Secondary | ICD-10-CM | POA: Diagnosis not present

## 2017-04-03 DIAGNOSIS — Z171 Estrogen receptor negative status [ER-]: Secondary | ICD-10-CM

## 2017-04-03 DIAGNOSIS — Z170 Estrogen receptor positive status [ER+]: Secondary | ICD-10-CM

## 2017-04-03 DIAGNOSIS — C50611 Malignant neoplasm of axillary tail of right female breast: Secondary | ICD-10-CM

## 2017-04-03 DIAGNOSIS — C50911 Malignant neoplasm of unspecified site of right female breast: Secondary | ICD-10-CM | POA: Diagnosis present

## 2017-04-03 LAB — CMP (CANCER CENTER ONLY)
ALBUMIN: 3.7 g/dL (ref 3.5–5.0)
ALK PHOS: 85 U/L — AB (ref 26–84)
ALT: 31 U/L (ref 10–47)
AST: 24 U/L (ref 11–38)
Anion gap: 4 — ABNORMAL LOW (ref 5–15)
BILIRUBIN TOTAL: 0.5 mg/dL (ref 0.2–1.6)
BUN: 15 mg/dL (ref 7–22)
CALCIUM: 9.4 mg/dL (ref 8.0–10.3)
CO2: 32 mmol/L (ref 18–33)
CREATININE: 0.8 mg/dL (ref 0.60–1.20)
Chloride: 109 mmol/L — ABNORMAL HIGH (ref 98–108)
Glucose, Bld: 116 mg/dL (ref 73–118)
Potassium: 3.6 mmol/L (ref 3.3–4.7)
Sodium: 145 mmol/L (ref 128–145)
Total Protein: 6.9 g/dL (ref 6.4–8.1)

## 2017-04-03 LAB — CBC WITH DIFFERENTIAL (CANCER CENTER ONLY)
BASOS ABS: 0 10*3/uL (ref 0.0–0.1)
BASOS PCT: 1 %
Eosinophils Absolute: 0.2 10*3/uL (ref 0.0–0.5)
Eosinophils Relative: 3 %
HEMATOCRIT: 42.3 % (ref 34.8–46.6)
HEMOGLOBIN: 14.3 g/dL (ref 11.6–15.9)
Lymphocytes Relative: 47 %
Lymphs Abs: 2.9 10*3/uL (ref 0.9–3.3)
MCH: 31 pg (ref 26.0–34.0)
MCHC: 33.8 g/dL (ref 32.0–36.0)
MCV: 91.6 fL (ref 81.0–101.0)
Monocytes Absolute: 0.4 10*3/uL (ref 0.1–0.9)
Monocytes Relative: 7 %
NEUTROS ABS: 2.6 10*3/uL (ref 1.5–6.5)
NEUTROS PCT: 42 %
Platelet Count: 238 10*3/uL (ref 145–400)
RBC: 4.62 MIL/uL (ref 3.70–5.32)
RDW: 12.5 % (ref 11.1–15.7)
WBC: 6.1 10*3/uL (ref 3.9–10.0)

## 2017-04-03 MED ORDER — OXYBUTYNIN CHLORIDE ER 10 MG PO TB24: 10.0000 mg | ORAL_TABLET | Freq: Every day | ORAL | 6 refills | Status: DC

## 2017-04-03 NOTE — Progress Notes (Signed)
Hematology and Oncology Follow Up Visit  Rachel [name] [facility] [id number] 04-Jun-1965 52 y.o. 04/03/2017   Principle Diagnosis:  Stage I (T1 N0M0) infiltrating ductal carcinoma the right breast  Current Therapy:    Observation     Interim History:  [name] is back for followup.  We see her every 6 months.  The big news this time is that she will be retiring in December.  She will have had 30 years in the [employer] system.  Her first grandchild is coming in March.  Will be delivered via C-section.  She is excited about this.  It will be a boy.  She is quite congested this afternoon.  She takes over-the-counter medications.  She is still working full-time.  She is doing well and enjoying this.  She is had no issues with nausea or vomiting.  She has had no rashes.  She is having some hot flashes.  There is been no cough.  Again she is a little congested.  She still has not yet seen the genetics counselor.  She had breast cancer when she was 52 years old.  There is no history in the family of breast cancer.     Overall, her performance status is ECOG 0.   Medications:  Current Outpatient Medications:  .  butalbital-acetaminophen-caffeine (FIORICET, ESGIC) 50-325-40 MG per tablet, TAKE 1 TABLET BY MOUTH EVERY 6 HOURS AS NEEDED FOR HEADACHE, Disp: 20 tablet, Rfl: 0 .  Cholecalciferol (VITAMIN D PO), Take 2,000 Units by mouth., Disp: , Rfl:  .  fluticasone (FLONASE) 50 MCG/ACT nasal spray, INHALE 2 SPRASY IN EACH NOSTRIL ONCE DAILY, Disp: 16 g, Rfl: 11 .  oxybutynin (DITROPAN-XL) 10 MG 24 hr tablet, Take 1 tablet (10 mg total) by mouth at bedtime., Disp: 30 tablet, Rfl: 6 .  promethazine (PHENERGAN) 25 MG tablet, TAKE 1 TABLET BY MOUTH EVERY 8 HOURS AS NEEDED FOR NAUSEA/VOMITING, Disp: 20 tablet, Rfl: 0 .  rizatriptan (MAXALT) 10 MG tablet, TAKE 1 TABLET BY MOUTH EVERY DAY AS NEEDED FOR MIGRANE, Disp: 6 tablet, Rfl: 1 .  UNITHROID 100 MCG tablet, Take 1 tablet (100 mcg total) by  mouth daily before breakfast., Disp: 90 tablet, Rfl: 3  Allergies:  Allergies  Allergen Reactions  . Erythromycin Nausea And Vomiting  . Erythromycin Base     Past Medical History, Surgical history, Social history, and Family History were reviewed and updated.  Review of Systems: Review of Systems  Constitutional: Negative.   HENT: Positive for congestion.   Eyes: Negative.   Respiratory: Negative.   Cardiovascular: Negative.   Gastrointestinal: Negative.   Genitourinary: Negative.   Musculoskeletal: Negative.   Skin: Negative.   Neurological: Negative.   Endo/Heme/Allergies: Negative.   Psychiatric/Behavioral: Negative.      Physical Exam:  weight is 149 lb (67.6 kg). Her oral temperature is 98.1 F (36.7 C). Her blood pressure is 125/63 and her pulse is 65. Her respiration is 16 and oxygen saturation is 97%.   Well-developed and well-nourished white female. Head and neck exam shows no ocular or oral lesions. She has no palpable cervical or supraclavicular lymph nodes. Lungs are clear bilaterally. Cardiac exam regular rate and rhythm with no murmurs rubs or bruits. Abdomen is soft. Has good bowel sounds. There is no fluid wave. There is no palpable liver or spleen tip. Breast exam shows left breast no masses edema or erythema. There is no left axillary adenopathy. Right breast is slightly contracted from surgery and radiation. She  has a well-healed lumpectomy at the 10:00 position. No distinct mass is noted in the right breast. There is no right axillary adenopathy. Back exam shows no tenderness over the spine ribs or hips. Extremities shows no clubbing cyanosis or edema. Skin exam no rashes, ecchymosis or petechia. Neurological exam is nonfocal.  Lab Results  Component Value Date   WBC 6.1 04/03/2017   HGB 14.1 09/26/2016   HCT 42.3 04/03/2017   MCV 91.6 04/03/2017   PLT 238 04/03/2017     Chemistry      Component Value Date/Time   NA 145 04/03/2017 1508   NA 140  09/26/2016 0743   NA 143 09/21/2015 0854   K 3.6 04/03/2017 1508   K 3.8 09/26/2016 0743   K 4.4 09/21/2015 0854   CL 109 (H) 04/03/2017 1508   CL 104 09/26/2016 0743   CO2 32 04/03/2017 1508   CO2 30 09/26/2016 0743   CO2 28 09/21/2015 0854   BUN 15 04/03/2017 1508   BUN 14 09/26/2016 0743   BUN 13.9 09/21/2015 0854   CREATININE 0.80 04/03/2017 1508   CREATININE 1.0 09/26/2016 0743   CREATININE 0.8 09/21/2015 0854      Component Value Date/Time   CALCIUM 9.4 04/03/2017 1508   CALCIUM 9.4 09/26/2016 0743   CALCIUM 10.1 09/21/2015 0854   ALKPHOS 85 (H) 04/03/2017 1508   ALKPHOS 80 09/26/2016 0743   ALKPHOS 98 09/21/2015 0854   AST 24 04/03/2017 1508   AST 37 (H) 09/21/2015 0854   ALT 31 04/03/2017 1508   ALT 35 09/26/2016 0743   ALT 57 (H) 09/21/2015 0854   BILITOT 0.5 04/03/2017 1508   BILITOT 0.53 09/21/2015 0854        Impression and Plan: Rachel [name] is a 52 year old white female with a history of stage I ductal carcinoma of the right breast. She was diagnosed 15 years ago. 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 I would think that  she is cured.  We will get her set up with genetics counseling again.  Hopefully, she will be able to get the genetic testing done.  I will see her back in 6 months.  By then, we will both be grandparents.  I know this is incredibly exciting for her.  I know this is exciting for me.  I will try some oxybutynin (10 mg p.o. daily) to try to help with her hot flashes.    Volanda Napoleon, MD 2/22/20194:42 PM

## 2017-04-04 LAB — FOLLICLE STIMULATING HORMONE: FSH: 89.9 m[IU]/mL

## 2017-04-04 LAB — LUTEINIZING HORMONE: LH: 42.5 m[IU]/mL

## 2017-04-04 LAB — VITAMIN D 25 HYDROXY (VIT D DEFICIENCY, FRACTURES): VIT D 25 HYDROXY: 36 ng/mL (ref 30.0–100.0)

## 2017-04-06 ENCOUNTER — Telehealth: Payer: Self-pay | Admitting: *Deleted

## 2017-04-06 ENCOUNTER — Encounter: Payer: Self-pay | Admitting: *Deleted

## 2017-04-06 LAB — TSH: TSH: 0.735 u[IU]/mL (ref 0.308–3.960)

## 2017-04-06 NOTE — Telephone Encounter (Addendum)
Will send message via My Chart  ----- Message from Volanda Napoleon, MD sent at 04/06/2017  1:38 PM EST ----- Call - the thyroid level is fine!!  Laurey Arrow

## 2017-04-07 ENCOUNTER — Telehealth: Payer: Self-pay | Admitting: Hematology & Oncology

## 2017-04-07 NOTE — Telephone Encounter (Signed)
Scheduled appt per 2/25 sch message - Genetics - left message for patient with appt date and time.

## 2017-04-09 ENCOUNTER — Ambulatory Visit (INDEPENDENT_AMBULATORY_CARE_PROVIDER_SITE_OTHER): Payer: BC Managed Care – PPO | Admitting: Gynecology

## 2017-04-09 ENCOUNTER — Encounter: Payer: Self-pay | Admitting: Gynecology

## 2017-04-09 VITALS — BP 118/76 | Ht 64.0 in | Wt 150.0 lb

## 2017-04-09 DIAGNOSIS — Z853 Personal history of malignant neoplasm of breast: Secondary | ICD-10-CM

## 2017-04-09 DIAGNOSIS — M810 Age-related osteoporosis without current pathological fracture: Secondary | ICD-10-CM

## 2017-04-09 DIAGNOSIS — Z01411 Encounter for gynecological examination (general) (routine) with abnormal findings: Secondary | ICD-10-CM

## 2017-04-09 DIAGNOSIS — N952 Postmenopausal atrophic vaginitis: Secondary | ICD-10-CM | POA: Diagnosis not present

## 2017-04-09 DIAGNOSIS — N951 Menopausal and female climacteric states: Secondary | ICD-10-CM | POA: Diagnosis not present

## 2017-04-09 LAB — ESTRADIOL, ULTRA SENS: ESTRADIOL, SENSITIVE: 3.1 pg/mL

## 2017-04-09 NOTE — Patient Instructions (Signed)
Follow-up with results from the genetic testing.  Follow-up in 1 year for annual exam

## 2017-04-09 NOTE — Progress Notes (Addendum)
    Albertia Carvin [facility] 04-27-1965 [id number]        51 y.o.  [id number] for annual gynecologic exam.  Having some hot flushes and sweats.  Saw Dr. Marin Olp who started her on Ditropan trial for this.  Past medical history,surgical history, problem list, medications, allergies, family history and social history were all reviewed and documented as reviewed in the EPIC chart.  ROS:  Performed with pertinent positives and negatives included in the history, assessment and plan.   Additional significant findings : None   Exam: Caryn Bee assistant Vitals:   04/09/17 1603  BP: 118/76  Weight: 150 lb (68 kg)  Height: 5\' 4"  (1.626 m)   Body mass index is 25.75 kg/m.  General appearance:  Normal affect, orientation and appearance. Skin: Grossly normal HEENT: Without gross lesions.  No cervical or supraclavicular adenopathy. Thyroid normal.  Lungs:  Clear without wheezing, rales or rhonchi Cardiac: RR, without RMG Abdominal:  Soft, nontender, without masses, guarding, rebound, organomegaly or hernia Breasts:  Examined lying and sitting without masses, retractions, discharge or axillary adenopathy.  Well-healed right lumpectomy scar Pelvic:  Ext, BUS, Vagina: With atrophic changes  Cervix: With atrophic changes  Uterus: Anteverted, normal size, shape and contour, midline and mobile nontender   Adnexa: Without masses or tenderness    Anus and perineum: Normal   Rectovaginal: Normal sphincter tone without palpated masses or tenderness.    Assessment/Plan:  52 y.o. G1P2002 female for annual gynecologic exam.   1. Postmenopausal.  Having hot flushes and sweats on and off.  Was given prescription for Ditropan but has not started yet.  We will go ahead and try this and see how she does with this.  Alternatives of the nonhormonal discussed to include Effexor, Neurontin and other less well studied prescription alternatives.  At this point she is avoiding soy given her estrogen positive breast cancer  history.  She will follow-up if this continues to be an issue.  She has had no bleeding. 2. Right breast cancer 2001.  Exam NED.  Mammography 11/2016.  Continue with annual mammography when due.  Is in the process of arranging genetic testing.  If positive she knows the need to discuss prophylactic salpingo-oophorectomies.  She will follow-up with me for these results. 3. Osteoporosis.  DEXA 09/2016 T score -2.5.  Statistically increase in the right hip and stable at the left hip and AP spine from her prior study.  We had discussed treatment options in the past and she elects no treatment at this time but prefers observation with follow-up density in 2-year interval. 4. Pap smear/HPV 03/2013.  No Pap smear done today.  No history of significant abnormal Pap smears.  Plan repeat Pap smear next year at 5-year interval per current screening guidelines. 5. Colonoscopy not yet but will plan to schedule as she is turned 51. 6. Health maintenance.  No routine lab work done as patient does this elsewhere.  Follow-up 1 year, sooner as needed.   [name] MD, 4:40 PM 04/09/2017

## 2017-04-14 ENCOUNTER — Ambulatory Visit (INDEPENDENT_AMBULATORY_CARE_PROVIDER_SITE_OTHER): Payer: BC Managed Care – PPO | Admitting: Family Medicine

## 2017-04-14 ENCOUNTER — Other Ambulatory Visit: Payer: Self-pay

## 2017-04-14 ENCOUNTER — Encounter: Payer: Self-pay | Admitting: Family Medicine

## 2017-04-14 VITALS — BP 124/82 | HR 68 | Temp 97.8°F | Resp 14 | Wt 149.0 lb

## 2017-04-14 DIAGNOSIS — J019 Acute sinusitis, unspecified: Secondary | ICD-10-CM | POA: Diagnosis not present

## 2017-04-14 DIAGNOSIS — B9689 Other specified bacterial agents as the cause of diseases classified elsewhere: Secondary | ICD-10-CM

## 2017-04-14 MED ORDER — LEVOFLOXACIN 500 MG PO TABS: 500.0000 mg | ORAL_TABLET | Freq: Every day | ORAL | 0 refills | Status: DC

## 2017-04-14 MED ORDER — PREDNISONE 20 MG PO TABS: ORAL_TABLET | ORAL | 0 refills | Status: DC

## 2017-04-14 NOTE — Progress Notes (Signed)
Subjective:    Patient ID: Rachel [name], female    DOB: 1965/06/22, 52 y.o.   MRN: [id number]  HPI  Patient is a very pleasant 52 year old white female who presents with 2 weeks of maxillary and frontal sinus tenderness to palpation, headaches, and purulent nasal discharge. She reports constant rhinorrhea.  She  is unable to breathe through her nose.  She has tried nasal saline, sudafed, and flonase and afrin x 2 weeks without relief.   Past Medical History:  Diagnosis Date  . ASCUS (atypical squamous cells of undetermined significance) on Pap smear 02/2006, 05/2006   NEG HPV ----- PAP 12/2007 WNL  . 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 Chiari malformation type I ([place])   . Hypercholesteremia   . Hypothyroidism   . Osteoporosis 09/2016   T score of -2.5 statistically significant increased right hip, stable left hip and AP spine  . Personal history of chemotherapy 2001  . Personal history of radiation therapy 2001   Past Surgical History:  Procedure Laterality Date  . BREAST EXCISIONAL BIOPSY Left   . BREAST LUMPECTOMY Right 2001  . CESAREAN SECTION    . COLPOSCOPY    . PELVIC LAPAROSCOPY    . TONSILLECTOMY     Current Outpatient Medications on File Prior to Visit  Medication Sig Dispense Refill  . butalbital-acetaminophen-caffeine (FIORICET, ESGIC) 50-325-40 MG per tablet TAKE 1 TABLET BY MOUTH EVERY 6 HOURS AS NEEDED FOR HEADACHE 20 tablet 0  . Cholecalciferol (VITAMIN D PO) Take 2,000 Units by mouth.    . fluticasone (FLONASE) 50 MCG/ACT nasal spray INHALE 2 SPRASY IN EACH NOSTRIL ONCE DAILY 16 g 11  . oxybutynin (DITROPAN-XL) 10 MG 24 hr tablet Take 1 tablet (10 mg total) by mouth at bedtime. 30 tablet 6  . promethazine (PHENERGAN) 25 MG tablet TAKE 1 TABLET BY MOUTH EVERY 8 HOURS AS NEEDED FOR NAUSEA/VOMITING 20 tablet 0  . rizatriptan (MAXALT) 10 MG tablet TAKE 1 TABLET BY MOUTH EVERY DAY AS NEEDED FOR MIGRANE 6 tablet 1    . UNITHROID 100 MCG tablet Take 1 tablet (100 mcg total) by mouth daily before breakfast. 90 tablet 3   No current facility-administered medications on file prior to visit.    Allergies  Allergen Reactions  . Erythromycin Nausea And Vomiting  . Erythromycin Base    Social History   Socioeconomic History  . Marital status: Married    Spouse name: Not on file  . Number of children: Not on file  . Years of education: Not on file  . Highest education level: Not on file  Social Needs  . Financial resource strain: Not on file  . Food insecurity - worry: Not on file  . Food insecurity - inability: Not on file  . Transportation needs - medical: Not on file  . Transportation needs - non-medical: Not on file  Occupational History  . Not on file  Tobacco Use  . Smoking status: Never Smoker  . Smokeless tobacco: Never Used  . Tobacco comment: never used tobacco  Substance and Sexual Activity  . Alcohol use: Yes    Alcohol/week: 0.0 oz    Comment: social  . Drug use: No  . Sexual activity: Yes    Birth control/protection: Post-menopausal    Comment: 1st intercourse 52 yo-Fewer than 5 partners  Other Topics Concern  . Not on file  Social History Narrative  . Not on file  Review of Systems  All other systems reviewed and are negative.      Objective:   Physical Exam  HENT:  Right Ear: External ear normal.  Left Ear: External ear normal.  Nose: Mucosal edema and rhinorrhea present. Right sinus exhibits maxillary sinus tenderness and frontal sinus tenderness. Left sinus exhibits maxillary sinus tenderness and frontal sinus tenderness.  Mouth/Throat: Oropharynx is clear and moist. No oropharyngeal exudate.  Eyes: Conjunctivae are normal. Pupils are equal, round, and reactive to light.  Neck: Neck supple.  Cardiovascular: Normal rate, regular rhythm and normal heart sounds.  No murmur heard. Pulmonary/Chest: Effort normal and breath sounds normal.  Lymphadenopathy:     She has no cervical adenopathy.  Vitals reviewed.         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  Start prednisone taper pack.  Add levaquin 500 mg poday for 7 days in 2 days if no better on prednisone.

## 2017-04-30 ENCOUNTER — Other Ambulatory Visit: Payer: BC Managed Care – PPO

## 2017-07-23 ENCOUNTER — Inpatient Hospital Stay: Payer: BC Managed Care – PPO

## 2017-07-23 ENCOUNTER — Encounter: Payer: Self-pay | Admitting: Genetics

## 2017-07-23 ENCOUNTER — Inpatient Hospital Stay: Payer: BC Managed Care – PPO | Attending: Genetic Counselor | Admitting: Genetics

## 2017-07-23 DIAGNOSIS — Z8049 Family history of malignant neoplasm of other genital organs: Secondary | ICD-10-CM | POA: Insufficient documentation

## 2017-07-23 DIAGNOSIS — Z171 Estrogen receptor negative status [ER-]: Secondary | ICD-10-CM

## 2017-07-23 DIAGNOSIS — C50611 Malignant neoplasm of axillary tail of right female breast: Secondary | ICD-10-CM | POA: Diagnosis not present

## 2017-07-23 DIAGNOSIS — Z1379 Encounter for other screening for genetic and chromosomal anomalies: Secondary | ICD-10-CM | POA: Diagnosis not present

## 2017-07-23 DIAGNOSIS — C50219 Malignant neoplasm of upper-inner quadrant of unspecified female breast: Secondary | ICD-10-CM

## 2017-07-23 NOTE — Progress Notes (Signed)
REFERRING PROVIDER: Volanda Napoleon, MD [address], [place] [address]  PRIMARY PROVIDER:  Susy Frizzle, MD  PRIMARY REASON FOR VISIT:  1. 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 Family history of uterine cancer   3. Malignant neoplasm of upper-inner quadrant of female breast, unspecified estrogen receptor status, unspecified laterality ([place])     HISTORY OF PRESENT ILLNESS:   Rachel [name], a 52 y.o. female, was seen for a [facility] cancer genetics consultation at the request of Dr. Marin Olp due to a personal history of breast cancer dx at 27.  Rachel [name] presents to clinic today to discuss the possibility of a hereditary predisposition to cancer, genetic testing, and to further clarify her future cancer risks, as well as potential cancer risks for family members.   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  She underwent lumpectomy, chemotherapy, and radiation.     HORMONAL RISK FACTORS:  First live birth at age 36.  Ovaries intact: yes.  Hysterectomy: no.  Menopausal status: postmenopausal.  HRT use: 0 years. Colonoscopy: yes; reports having one many years ago that was nl.. Mammogram within the last year: yes.   Past Medical History:  Diagnosis Date  . ASCUS (atypical squamous cells of undetermined significance) on Pap smear 02/2006, 05/2006   NEG HPV ----- PAP 12/2007 WNL  . Cancer ([place]) 2001   HISTORY OF STAGE I INFILTRATING DUCTAL CARCINOMA OF RIGHT BREAST  . Chiari malformation type I ([place])   . Family history of uterine cancer   . Hypercholesteremia   . Hypothyroidism   . Osteoporosis 09/2016   T score of -2.5 statistically significant increased right hip, stable left hip and AP spine  . Personal history of chemotherapy 2001  . Personal history of radiation therapy 2001    Past Surgical History:  Procedure Laterality  Date  . BREAST EXCISIONAL BIOPSY Left   . BREAST LUMPECTOMY Right 2001  . CESAREAN SECTION    . COLPOSCOPY    . PELVIC LAPAROSCOPY    . TONSILLECTOMY      Social History   Socioeconomic History  . Marital status: Married    Spouse name: Not on file  . Number of children: Not on file  . Years of education: Not on file  . Highest education level: Not on file  Occupational History  . Not on file  Social Needs  . Financial resource strain: Not on file  . Food insecurity:    Worry: Not on file    Inability: Not on file  . Transportation needs:    Medical: Not on file    Non-medical: Not on file  Tobacco Use  . Smoking status: Never Smoker  . Smokeless tobacco: Never Used  . Tobacco comment: never used tobacco  Substance and Sexual Activity  . Alcohol use: Yes    Alcohol/week: 0.0 oz    Comment: social  . Drug use: No  . Sexual activity: Yes    Birth control/protection: Post-menopausal    Comment: 1st intercourse 52 yo-Fewer than 5 partners  Lifestyle  . Physical activity:    Days per week: Not on file    Minutes per session: Not on file  . Stress: Not on file  Relationships  . Social connections:    Talks on phone: Not on file    Gets together: Not  on file    Attends religious service: Not on file    Active member of club or organization: Not on file    Attends meetings of clubs or organizations: Not on file    Relationship status: Not on file  Other Topics Concern  . Not on file  Social History Narrative  . Not on file     FAMILY HISTORY:  We obtained a detailed, 4-generation family history.  Significant diagnoses are listed below: Family History  Problem Relation Age of Onset  . Diabetes Father   . Uterine cancer Maternal Aunt 60   Rachel [name] has 2 sons ages 93 and 34 with no history of cancer.  Rachel [name] oldest son just had a baby boy.  Rachel [name] has 3 brothers with no history of cancer (1 brother died at 80 year, the other brothers are in their 71's with  no history of cancer), and 1 sister who is 29 with no history of cancer.   Rachel [name] father: died at 67 with no history of cancer.  Paternal aunts/Uncles: none, father was only child Paternal cousins: none Paternal grandfather: died at 20 with no history of cancer.  He had a sister who died at 55.  Paternal grandmother:died at 81 with no history of cancer. She had a sister with no cancer and a mother who died in her 38's due to stroke.   Rachel [name] mother: 72, no history of cancer.  She had a hysterectomy to remove her uterus in her 47's, but ovaries intact.  Maternal Aunts/Uncles: 2 maternal uncles and 2 maternal aunts: -1 maternal aunt dx uterine cancer at 63, died at 56. -1 maternal aunt died at 38 with no history of cancer. -1 maternal uncle died at 5 with no history of cancer.  -1 maternal uncle is 34 with no history of cancer.  Maternal cousins: no history of cancer.  Maternal grandfather: died at 63 with no history of cancer.  Maternal grandmother:died at 73 with no history of cancer.   Rachel [name] is unaware of previous family history of genetic testing for hereditary cancer risks. Patient's maternal ancestors are of Caucasian descent, and paternal ancestors are of Caucasian descent. She reports she has some Native [employer] as well. There is no reported Ashkenazi Jewish ancestry. There is no known consanguinity.  GENETIC COUNSELING ASSESSMENT: DEVINNE EPSTEIN is a 52 y.o. female with a personal history which is somewhat suggestive of a Hereditary Cancer Predisposition Syndrome. We, therefore, discussed and recommended the following at today's visit.   DISCUSSION: We reviewed the characteristics, features and inheritance patterns of hereditary cancer syndromes. We also discussed genetic testing, including the appropriate family members to test, the process of testing, insurance coverage and turn-around-time for results. We discussed the implications of a negative, positive and/or  variant of uncertain significant result. Due to her age at dx of her breast cancer, we recommended Rachel [name] pursue genetic testing for the Common Hereditary Cancer gene panel.   The Common Hereditary Cancer Panel offered by [facility] includes sequencing and/or deletion duplication testing of the following 47 genes: APC, ATM, AXIN2, BARD1, BMPR1A, BRCA1, BRCA2, BRIP1, CDH1, CDKN2A (p14ARF), CDKN2A (p16INK4a), CKD4, CHEK2, CTNNA1, DICER1, EPCAM (Deletion/duplication testing only), GREM1 (promoter region deletion/duplication testing only), KIT, MEN1, MLH1, MSH2, MSH3, MSH6, MUTYH, NBN, NF1, NHTL1, PALB2, PDGFRA, PMS2, POLD1, POLE, PTEN, RAD50, RAD51C, RAD51D, SDHB, SDHC, SDHD, SMAD4, SMARCA4. STK11, TP53, TSC1, TSC2, and VHL.  The following genes were evaluated for sequence changes only: SDHA  and HOXB13 c.251G>A variant only.  We also discussed the option to pursue a smaller more targeted panel (breast and Gyn panel).    We discussed that only 5-10% of cancers are associated with a Hereditary cancer predisposition syndrome.  One of the most common hereditary cancer syndromes that increases breast cancer risk is called Hereditary Breast and Ovarian Cancer (HBOC) syndrome.  This syndrome is caused by mutations in the BRCA1 and BRCA2 genes.  This syndrome increases an individual's lifetime risk to develop breast, ovarian, pancreatic, and other types of cancer.  There are also many other cancer predisposition syndromes caused by mutations in several other genes.  Most often these are inherited from parents, but we did discuss in rare situations a person can be the first in their family to have a mutation (de novo).   We discussed that if she is found to have a mutation in one of these genes, it may impact surgical decisions, and alter future medical management recommendations such as increased cancer screenings and consideration of risk reducing surgeries.  A positive result could also have implications for the  patient's family members.  A Negative result would mean we were unable to identify a hereditary component to her cancer, but does not rule out the possibility of a hereditary basis for her cancer.  There could be mutations that are undetectable by current technology, or in genes not yet tested or identified to increase cancer risk.    We discussed the potential to find a Variant of Uncertain Significance or VUS.  These are variants that have not yet been identified as pathogenic or benign, and it is unknown if this variant is associated with increased cancer risk or if this is a normal finding.  Most VUS's are reclassified to benign or likely benign.   It should not be used to make medical management decisions. With time, we suspect the lab will determine the significance of any VUS's identified if any.   Based on Rachel [name] personal history of cancer, she meets medical criteria for genetic testing. Despite that she meets criteria, she may still have an out of pocket cost. We discussed that the laboratory can provide her with an estimate of the cost with her insurance as well as offer her the self-pay price of $250.   Rachel [name] asked about genetic discrimination. We discussed there is a Educational psychologist called the Genetic Information Non-Discrimination Act (GINA) of 2008 helps protect individuals against genetic discrimination based on their genetic test results.  It impacts both health insurance and employment.  For health insurance, it protects against increased premiums, being kicked off insurance or being forced to take a test in order to be insured.  For employment it protects against hiring, firing and promoting decisions based on genetic test results.  Health status due to a cancer diagnosis is not protected under GINA.  This law does not protect life insurance, disability insurance, or other types of insurance.    PLAN: Despite our recommendation, Rachel [name] declined to pursue genetic testing at today's  visit. We understand this decision, and remain available to coordinate genetic testing at any time in the future. We; therefore, recommend Rachel [name] continue to follow the cancer screening guidelines given by her primary healthcare provider. She will contact me directly if she changes her mind and would like to proceed with genetic testing in the future.   Given she has declined, other relatives may consider having genetic counseling and testing, although she is the  most informative person in the family to test.  Rachel [name] will let us know if we can be of any assistance in coordinating genetic counseling and/or testing for this family member.   Lastly, we encouraged Rachel [name] to remain in contact with cancer genetics annually so that we can continuously update the family history and inform her of any changes in cancer genetics and testing that may be of benefit for this family.   [name] questions were answered to her satisfaction today. Our contact information was provided as well as some patient materials/fact sheets about BRCA1/2 and genetic testing.  We are available should additional questions or concerns arise. Thank you for the referral and allowing [place] to share in the care of your patient.   Tana Felts, MS, [facility] Certified Genetic Counselor [email or web address] phone: [phone]  The patient was seen for a total of 60 minutes in face-to-face genetic counseling.   This patient was discussed with Drs. Magrinat, Lindi Adie and/or Burr Medico who agrees with the above.

## 2017-09-17 ENCOUNTER — Other Ambulatory Visit: Payer: Self-pay | Admitting: Family Medicine

## 2017-09-17 DIAGNOSIS — M8580 Other specified disorders of bone density and structure, unspecified site: Secondary | ICD-10-CM

## 2017-09-17 DIAGNOSIS — E039 Hypothyroidism, unspecified: Secondary | ICD-10-CM

## 2017-09-17 DIAGNOSIS — Z171 Estrogen receptor negative status [ER-]: Secondary | ICD-10-CM

## 2017-09-17 DIAGNOSIS — Z0000 Encounter for general adult medical examination without abnormal findings: Secondary | ICD-10-CM

## 2017-09-17 DIAGNOSIS — C50611 Malignant neoplasm of axillary tail of right female breast: Secondary | ICD-10-CM

## 2017-09-17 DIAGNOSIS — E7800 Pure hypercholesterolemia, unspecified: Secondary | ICD-10-CM

## 2017-09-23 ENCOUNTER — Other Ambulatory Visit: Payer: BC Managed Care – PPO

## 2017-09-23 DIAGNOSIS — M8580 Other specified disorders of bone density and structure, unspecified site: Secondary | ICD-10-CM

## 2017-09-23 DIAGNOSIS — C50611 Malignant neoplasm of axillary tail of right female breast: Secondary | ICD-10-CM

## 2017-09-23 DIAGNOSIS — Z171 Estrogen receptor negative status [ER-]: Secondary | ICD-10-CM

## 2017-09-23 DIAGNOSIS — Z0000 Encounter for general adult medical examination without abnormal findings: Secondary | ICD-10-CM

## 2017-09-23 DIAGNOSIS — E7800 Pure hypercholesterolemia, unspecified: Secondary | ICD-10-CM

## 2017-09-23 DIAGNOSIS — E039 Hypothyroidism, unspecified: Secondary | ICD-10-CM

## 2017-09-24 LAB — CBC WITH DIFFERENTIAL/PLATELET
BASOS ABS: 50 {cells}/uL (ref 0–200)
Basophils Relative: 0.9 %
EOS ABS: 162 {cells}/uL (ref 15–500)
Eosinophils Relative: 2.9 %
HCT: 43 % (ref 35.0–45.0)
HEMOGLOBIN: 14.7 g/dL (ref 11.7–15.5)
Lymphs Abs: 2783 cells/uL (ref 850–3900)
MCH: 29.7 pg (ref 27.0–33.0)
MCHC: 34.2 g/dL (ref 32.0–36.0)
MCV: 86.9 fL (ref 80.0–100.0)
MONOS PCT: 7.2 %
MPV: 10.4 fL (ref 7.5–12.5)
NEUTROS ABS: 2201 {cells}/uL (ref 1500–7800)
Neutrophils Relative %: 39.3 %
Platelets: 236 10*3/uL (ref 140–400)
RBC: 4.95 10*6/uL (ref 3.80–5.10)
RDW: 13.1 % (ref 11.0–15.0)
TOTAL LYMPHOCYTE: 49.7 %
WBC: 5.6 10*3/uL (ref 3.8–10.8)
WBCMIX: 403 {cells}/uL (ref 200–950)

## 2017-09-24 LAB — TSH: TSH: 2.29 m[IU]/L

## 2017-09-24 LAB — COMPREHENSIVE METABOLIC PANEL
AG RATIO: 2 (calc) (ref 1.0–2.5)
ALT: 19 U/L (ref 6–29)
AST: 18 U/L (ref 10–35)
Albumin: 4.3 g/dL (ref 3.6–5.1)
Alkaline phosphatase (APISO): 79 U/L (ref 33–130)
BUN: 19 mg/dL (ref 7–25)
CHLORIDE: 107 mmol/L (ref 98–110)
CO2: 29 mmol/L (ref 20–32)
CREATININE: 0.82 mg/dL (ref 0.50–1.05)
Calcium: 9.4 mg/dL (ref 8.6–10.4)
GLOBULIN: 2.2 g/dL (ref 1.9–3.7)
GLUCOSE: 100 mg/dL — AB (ref 65–99)
Potassium: 5 mmol/L (ref 3.5–5.3)
SODIUM: 142 mmol/L (ref 135–146)
TOTAL PROTEIN: 6.5 g/dL (ref 6.1–8.1)
Total Bilirubin: 0.6 mg/dL (ref 0.2–1.2)

## 2017-09-24 LAB — LIPID PANEL
Cholesterol: 248 mg/dL — ABNORMAL HIGH (ref <200)
HDL: 53 mg/dL (ref >50)
LDL Cholesterol (Calc): 169 mg/dL (calc) — ABNORMAL HIGH
Non-HDL Cholesterol (Calc): 195 mg/dL (calc) — ABNORMAL HIGH (ref <130)
Total CHOL/HDL Ratio: 4.7 (calc) (ref <5.0)
Triglycerides: 123 mg/dL (ref <150)

## 2017-09-24 LAB — VITAMIN D 25 HYDROXY (VIT D DEFICIENCY, FRACTURES): VIT D 25 HYDROXY: 49 ng/mL (ref 30–100)

## 2017-09-25 ENCOUNTER — Other Ambulatory Visit: Payer: Self-pay

## 2017-09-25 ENCOUNTER — Encounter: Payer: Self-pay | Admitting: Hematology & Oncology

## 2017-09-25 ENCOUNTER — Ambulatory Visit (INDEPENDENT_AMBULATORY_CARE_PROVIDER_SITE_OTHER): Payer: BC Managed Care – PPO | Admitting: Family Medicine

## 2017-09-25 ENCOUNTER — Encounter: Payer: Self-pay | Admitting: Family Medicine

## 2017-09-25 ENCOUNTER — Inpatient Hospital Stay: Payer: BC Managed Care – PPO | Attending: Genetic Counselor | Admitting: Hematology & Oncology

## 2017-09-25 ENCOUNTER — Other Ambulatory Visit: Payer: BC Managed Care – PPO

## 2017-09-25 VITALS — BP 110/64 | HR 54 | Temp 97.8°F | Resp 14 | Ht 64.0 in | Wt 150.0 lb

## 2017-09-25 VITALS — BP 115/63 | HR 53 | Temp 98.3°F | Resp 16 | Wt 150.0 lb

## 2017-09-25 DIAGNOSIS — Z23 Encounter for immunization: Secondary | ICD-10-CM | POA: Diagnosis not present

## 2017-09-25 DIAGNOSIS — E039 Hypothyroidism, unspecified: Secondary | ICD-10-CM | POA: Diagnosis not present

## 2017-09-25 DIAGNOSIS — N951 Menopausal and female climacteric states: Secondary | ICD-10-CM | POA: Insufficient documentation

## 2017-09-25 DIAGNOSIS — C50611 Malignant neoplasm of axillary tail of right female breast: Secondary | ICD-10-CM | POA: Diagnosis not present

## 2017-09-25 DIAGNOSIS — M8580 Other specified disorders of bone density and structure, unspecified site: Secondary | ICD-10-CM

## 2017-09-25 DIAGNOSIS — Z853 Personal history of malignant neoplasm of breast: Secondary | ICD-10-CM | POA: Diagnosis present

## 2017-09-25 DIAGNOSIS — E7800 Pure hypercholesterolemia, unspecified: Secondary | ICD-10-CM

## 2017-09-25 DIAGNOSIS — Z171 Estrogen receptor negative status [ER-]: Secondary | ICD-10-CM

## 2017-09-25 DIAGNOSIS — Z0000 Encounter for general adult medical examination without abnormal findings: Secondary | ICD-10-CM | POA: Diagnosis not present

## 2017-09-25 DIAGNOSIS — Z1211 Encounter for screening for malignant neoplasm of colon: Secondary | ICD-10-CM

## 2017-09-25 NOTE — Progress Notes (Signed)
Subjective:    Patient ID: Rachel [name], female    DOB: 1966/01/08, 52 y.o.   MRN: [id number]  HPI Patient is here today for complete physical exam.  She sees her gynecologist in February and she is scheduled to have a Pap smear at that time as this is due.  Her mammogram is up-to-date.  She has never had a colonoscopy.  Patient would be willing to have me schedule this for her now.  Her most recent lab work is listed below.  She is due for an HIV screen but she politely declines this due to lack of risk factors.  Is due for a flu shot this fall which I recommended.  She is due for a tetanus shot particularly given the fact she has a 59-month-old grandchild.  I would recommend a Tdap. Appointment on 09/23/2017  Component Date Value Ref Range Status  . Vit D, 25-Hydroxy 09/23/2017 49  30 - 100 ng/mL Final   Comment: Vitamin D Status         25-OH Vitamin D: . Deficiency:                    <20 ng/mL Insufficiency:             20 - 29 ng/mL Optimal:                 > or = 30 ng/mL . For 25-OH Vitamin D testing on patients on  D2-supplementation and patients for whom quantitation  of D2 and D3 fractions is required, the QuestAssureD(TM) 25-OH VIT D, (D2,D3), LC/MS/MS is recommended: order  code [phone] (patients >41yrs). . For more information on this test, go to: http://education.questdiagnostics.com/faq/FAQ163 (This link is being provided for  informational/educational purposes only.)   . TSH 09/23/2017 2.29  mIU/L Final   Comment:           Reference Range .           > or = 20 Years  0.40-4.50 .                Pregnancy Ranges           First trimester    0.26-2.66           Second trimester   0.55-2.73           Third trimester    0.43-2.91   . Cholesterol 09/23/2017 248* <200 mg/dL Final  . HDL 09/23/2017 53  >50 mg/dL Final  . Triglycerides 09/23/2017 123  <150 mg/dL Final  . LDL Cholesterol (Calc) 09/23/2017 169* mg/dL (calc) Final   Comment: Reference range:  <100 . Desirable range <100 mg/dL for primary prevention;   <70 mg/dL for patients with CHD or diabetic patients  with > or = 2 CHD risk factors. Marland Kitchen LDL-C is now calculated using the Martin-Hopkins  calculation, which is a validated novel method providing  better accuracy than the Friedewald equation in the  estimation of LDL-C.  Cresenciano Genre et al. Annamaria Helling. 1443;154(00): 2061-2068  (http://education.[facility].com/faq/FAQ164)   . Total CHOL/HDL Ratio 09/23/2017 4.7  <5.0 (calc) Final  . Non-HDL Cholesterol (Calc) 09/23/2017 195* <130 mg/dL (calc) Final   Comment: For patients with diabetes plus 1 major ASCVD risk  factor, treating to a non-HDL-C goal of <100 mg/dL  (LDL-C of <70 mg/dL) is considered a therapeutic  option.   . Glucose, Bld 09/23/2017 100* 65 - 99 mg/dL Final   Comment: .  Fasting reference interval . For someone without known diabetes, a glucose value between 100 and 125 mg/dL is consistent with prediabetes and should be confirmed with a follow-up test. .   . BUN 09/23/2017 19  7 - 25 mg/dL Final  . Creat 09/23/2017 0.82  0.50 - 1.05 mg/dL Final   Comment: For patients >64 years of age, the reference limit for Creatinine is approximately 13% higher for people identified as African-American. .   Havery Moros Ratio 52/77/8242 NOT APPLICABLE  6 - 22 (calc) Final  . Sodium 09/23/2017 142  135 - 146 mmol/L Final  . Potassium 09/23/2017 5.0  3.5 - 5.3 mmol/L Final  . Chloride 09/23/2017 107  98 - 110 mmol/L Final  . CO2 09/23/2017 29  20 - 32 mmol/L Final  . Calcium 09/23/2017 9.4  8.6 - 10.4 mg/dL Final  . Total Protein 09/23/2017 6.5  6.1 - 8.1 g/dL Final  . Albumin 09/23/2017 4.3  3.6 - 5.1 g/dL Final  . Globulin 09/23/2017 2.2  1.9 - 3.7 g/dL (calc) Final  . AG Ratio 09/23/2017 2.0  1.0 - 2.5 (calc) Final  . Total Bilirubin 09/23/2017 0.6  0.2 - 1.2 mg/dL Final  . Alkaline phosphatase (APISO) 09/23/2017 79  33 - 130 U/L Final  . AST  09/23/2017 18  10 - 35 U/L Final  . ALT 09/23/2017 19  6 - 29 U/L Final  . WBC 09/23/2017 5.6  3.8 - 10.8 Thousand/uL Final  . RBC 09/23/2017 4.95  3.80 - 5.10 Million/uL Final  . Hemoglobin 09/23/2017 14.7  11.7 - 15.5 g/dL Final  . HCT 09/23/2017 43.0  35.0 - 45.0 % Final  . MCV 09/23/2017 86.9  80.0 - 100.0 fL Final  . MCH 09/23/2017 29.7  27.0 - 33.0 pg Final  . MCHC 09/23/2017 34.2  32.0 - 36.0 g/dL Final  . RDW 09/23/2017 13.1  11.0 - 15.0 % Final  . Platelets 09/23/2017 236  140 - 400 Thousand/uL Final  . MPV 09/23/2017 10.4  7.5 - 12.5 fL Final  . Neutro Abs 09/23/2017 2,201  1,500 - 7,800 cells/uL Final  . Lymphs Abs 09/23/2017 2,783  850 - 3,900 cells/uL Final  . WBC mixed population 09/23/2017 403  200 - 950 cells/uL Final  . Eosinophils Absolute 09/23/2017 162  15 - 500 cells/uL Final  . Basophils Absolute 09/23/2017 50  0 - 200 cells/uL Final  . Neutrophils Relative % 09/23/2017 39.3  % Final  . Total Lymphocyte 09/23/2017 49.7  % Final  . Monocytes Relative 09/23/2017 7.2  % Final  . Eosinophils Relative 09/23/2017 2.9  % Final  . Basophils Relative 09/23/2017 0.9  % Final    Past Medical History:  Diagnosis Date  . ASCUS (atypical squamous cells of undetermined significance) on Pap smear 02/2006, 05/2006   NEG HPV ----- PAP 12/2007 WNL  . Cancer ([place]) 2001   HISTORY OF STAGE I INFILTRATING DUCTAL CARCINOMA OF RIGHT BREAST  . Chiari malformation type I ([place])   . Family history of uterine cancer   . Hypercholesteremia   . Hypothyroidism   . Osteoporosis 09/2016   T score of -2.5 statistically significant increased right hip, stable left hip and AP spine  . Personal history of chemotherapy 2001  . Personal history of radiation therapy 2001   Past Surgical History:  Procedure Laterality Date  . BREAST EXCISIONAL BIOPSY Left   . BREAST LUMPECTOMY Right 2001  . CESAREAN SECTION    . COLPOSCOPY    .  PELVIC LAPAROSCOPY    . TONSILLECTOMY     Current Outpatient  Medications on File Prior to Visit  Medication Sig Dispense Refill  . butalbital-acetaminophen-caffeine (FIORICET, ESGIC) 50-325-40 MG per tablet TAKE 1 TABLET BY MOUTH EVERY 6 HOURS AS NEEDED FOR HEADACHE 20 tablet 0  . Cholecalciferol (VITAMIN D PO) Take 2,000 Units by mouth.    . fluticasone (FLONASE) 50 MCG/ACT nasal spray INHALE 2 SPRASY IN EACH NOSTRIL ONCE DAILY 16 g 11  . promethazine (PHENERGAN) 25 MG tablet TAKE 1 TABLET BY MOUTH EVERY 8 HOURS AS NEEDED FOR NAUSEA/VOMITING 20 tablet 0  . rizatriptan (MAXALT) 10 MG tablet TAKE 1 TABLET BY MOUTH EVERY DAY AS NEEDED FOR MIGRANE 6 tablet 1  . UNITHROID 100 MCG tablet Take 1 tablet (100 mcg total) by mouth daily before breakfast. 90 tablet 3   No current facility-administered medications on file prior to visit.    Allergies  Allergen Reactions  . Erythromycin Nausea And Vomiting  . Erythromycin Base    Social History   Socioeconomic History  . Marital status: Married    Spouse name: Not on file  . Number of children: Not on file  . Years of education: Not on file  . Highest education level: Not on file  Occupational History  . Not on file  Social Needs  . Financial resource strain: Not on file  . Food insecurity:    Worry: Not on file    Inability: Not on file  . Transportation needs:    Medical: Not on file    Non-medical: Not on file  Tobacco Use  . Smoking status: Never Smoker  . Smokeless tobacco: Never Used  . Tobacco comment: never used tobacco  Substance and Sexual Activity  . Alcohol use: Yes    Alcohol/week: 0.0 standard drinks    Comment: social  . Drug use: No  . Sexual activity: Yes    Birth control/protection: Post-menopausal    Comment: 1st intercourse 52 yo-Fewer than 5 partners  Lifestyle  . Physical activity:    Days per week: Not on file    Minutes per session: Not on file  . Stress: Not on file  Relationships  . Social connections:    Talks on phone: Not on file    Gets together: Not on  file    Attends religious service: Not on file    Active member of club or organization: Not on file    Attends meetings of clubs or organizations: Not on file    Relationship status: Not on file  . Intimate partner violence:    Fear of current or ex partner: Not on file    Emotionally abused: Not on file    Physically abused: Not on file    Forced sexual activity: Not on file  Other Topics Concern  . Not on file  Social History Narrative  . Not on file   Family History  Problem Relation Age of Onset  . Diabetes Father   . Uterine cancer Maternal Aunt 60     Review of Systems  All other systems reviewed and are negative.      Objective:   Physical Exam  Constitutional: She is oriented to person, place, and time. She appears well-developed and well-nourished. No distress.  HENT:  Head: Normocephalic and atraumatic.  Right Ear: External ear normal.  Left Ear: External ear normal.  Nose: Nose normal.  Mouth/Throat: Oropharynx is clear and moist. No oropharyngeal exudate.  Eyes: Pupils are  equal, round, and reactive to light. Conjunctivae and EOM are normal. Right eye exhibits no discharge. Left eye exhibits no discharge. No scleral icterus.  Neck: Normal range of motion. Neck supple. No JVD present. No tracheal deviation present. No thyromegaly present.  Cardiovascular: Normal rate, regular rhythm, normal heart sounds and intact distal pulses. Exam reveals no gallop and no friction rub.  No murmur heard. Pulmonary/Chest: Effort normal and breath sounds normal. No stridor. No respiratory distress. She has no wheezes. She has no rales. She exhibits no tenderness.  Abdominal: Soft. Bowel sounds are normal. She exhibits no distension and no mass. There is no tenderness. There is no rebound and no guarding. No hernia.  Musculoskeletal: Normal range of motion. She exhibits no edema, tenderness or deformity.  Lymphadenopathy:    She has no cervical adenopathy.  Neurological: She is  alert and oriented to person, place, and time. She displays normal reflexes. No cranial nerve deficit or sensory deficit. She exhibits normal muscle tone. Coordination normal.  Skin: Skin is warm. Capillary refill takes less than 2 seconds. No rash noted. She is not diaphoretic. No erythema. No pallor.  Psychiatric: She has a normal mood and affect. Her behavior is normal. Judgment and thought content normal.  Vitals reviewed.         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  Therefore under current AHA guidelines, she does not require a statin.  However she has a family history of coronary artery disease with a mother who has a stent.  Her LDL cholesterol is 169.  Despite this being against current evidence, I have recommended a low-dose statin however the patient continues to decline statin therapy.  The remainder of her lab work is excellent.  I recommended a flu shot this fall.  She received a Tdap today.  Her mammogram is up-to-date.  She gets her Pap smear in February with her gynecologist.  I will refer the patient to a gastroenterologist for colonoscopy.

## 2017-09-25 NOTE — Progress Notes (Signed)
Hematology and Oncology Follow Up Visit  Rachel [name] [facility] [id number] 21-Mar-1965 52 y.o. 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  We see her every 6 months.  The big news this time is that she will be retiring in December.  She will have had 30 years in the [facility] system.  Her first grandchild is now 49 months old.  It is a boy.  This is a big deal for her.  She really is excited about this.  She is able to see him quite often.  She had lab work done outside of our office.  This was done a couple days ago.  Her cholesterol is quite high but the cholesterol/HDL ratio was only 4.7.    She still has not yet seen the genetics counselor.  She had breast cancer when she was 52 years old.  There is no history in the family of breast cancer.     Overall, her performance status is ECOG 0.   Medications:  Current Outpatient Medications:  .  butalbital-acetaminophen-caffeine (FIORICET, ESGIC) 50-325-40 MG per tablet, TAKE 1 TABLET BY MOUTH EVERY 6 HOURS AS NEEDED FOR HEADACHE, Disp: 20 tablet, Rfl: 0 .  Cholecalciferol (VITAMIN D PO), Take 2,000 Units by mouth., Disp: , Rfl:  .  fluticasone (FLONASE) 50 MCG/ACT nasal spray, INHALE 2 SPRASY IN EACH NOSTRIL ONCE DAILY, Disp: 16 g, Rfl: 11 .  oxybutynin (DITROPAN-XL) 10 MG 24 hr tablet, Take 1 tablet (10 mg total) by mouth at bedtime., Disp: 30 tablet, Rfl: 6 .  promethazine (PHENERGAN) 25 MG tablet, TAKE 1 TABLET BY MOUTH EVERY 8 HOURS AS NEEDED FOR NAUSEA/VOMITING, Disp: 20 tablet, Rfl: 0 .  rizatriptan (MAXALT) 10 MG tablet, TAKE 1 TABLET BY MOUTH EVERY DAY AS NEEDED FOR MIGRANE, Disp: 6 tablet, Rfl: 1 .  UNITHROID 100 MCG tablet, Take 1 tablet (100 mcg total) by mouth daily before breakfast., Disp: 90 tablet, Rfl: 3  Allergies:  Allergies  Allergen Reactions  . Erythromycin Nausea And  Vomiting  . Erythromycin Base     Past Medical History, Surgical history, Social history, and Family History were reviewed and updated.  Review of Systems: Review of Systems  Constitutional: Negative.   HENT: Positive for congestion.   Eyes: Negative.   Respiratory: Negative.   Cardiovascular: Negative.   Gastrointestinal: Negative.   Genitourinary: Negative.   Musculoskeletal: Negative.   Skin: Negative.   Neurological: Negative.   Endo/Heme/Allergies: Negative.   Psychiatric/Behavioral: Negative.      Physical Exam:  weight is 150 lb (68 kg). Her oral temperature is 98.3 F (36.8 C). Her blood pressure is 115/63 and her pulse is 53 (abnormal). Her respiration is 16 and oxygen saturation is 100%.   Well-developed and well-nourished white female. Head and neck exam shows no ocular or oral lesions. She has no palpable cervical or supraclavicular lymph nodes. Lungs are clear bilaterally. Cardiac exam regular rate and rhythm with no murmurs rubs or bruits. Abdomen is soft. Has good bowel sounds. There is no fluid wave. There is no palpable liver or spleen tip. Breast exam shows left breast no masses edema or erythema. There is no left axillary adenopathy. Right breast is slightly contracted from surgery and radiation. She has a well-healed lumpectomy at the 10:00 position. No distinct mass is noted in the right  breast. There is no right axillary adenopathy. Back exam shows no tenderness over the spine ribs or hips. Extremities shows no clubbing cyanosis or edema. Skin exam no rashes, ecchymosis or petechia. Neurological exam is nonfocal.  Lab Results  Component Value Date   WBC 5.6 09/23/2017   HGB 14.7 09/23/2017   HCT 43.0 09/23/2017   MCV 86.9 09/23/2017   PLT 236 09/23/2017     Chemistry      Component Value Date/Time   NA 142 09/23/2017 0759   NA 140 09/26/2016 0743   NA 143 09/21/2015 0854   K 5.0 09/23/2017 0759   K 3.8 09/26/2016 0743   K 4.4 09/21/2015 0854   CL  107 09/23/2017 0759   CL 104 09/26/2016 0743   CO2 29 09/23/2017 0759   CO2 30 09/26/2016 0743   CO2 28 09/21/2015 0854   BUN 19 09/23/2017 0759   BUN 14 09/26/2016 0743   BUN 13.9 09/21/2015 0854   CREATININE 0.82 09/23/2017 0759   CREATININE 0.8 09/21/2015 0854      Component Value Date/Time   CALCIUM 9.4 09/23/2017 0759   CALCIUM 9.4 09/26/2016 0743   CALCIUM 10.1 09/21/2015 0854   ALKPHOS 85 (H) 04/03/2017 1508   ALKPHOS 80 09/26/2016 0743   ALKPHOS 98 09/21/2015 0854   AST 18 09/23/2017 0759   AST 24 04/03/2017 1508   AST 37 (H) 09/21/2015 0854   ALT 19 09/23/2017 0759   ALT 31 04/03/2017 1508   ALT 35 09/26/2016 0743   ALT 57 (H) 09/21/2015 0854   BILITOT 0.6 09/23/2017 0759   BILITOT 0.5 04/03/2017 1508   BILITOT 0.53 09/21/2015 0854        Impression and Plan: Rachel [name] is a 52 year old white female with a history of stage I ductal carcinoma of the right breast. She was diagnosed 15 years ago. 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 I would think that  she is cured.  We will get her set up with genetics counseling again.  Hopefully, she will be able to get the genetic testing done.  I will see her back in 6 months.  There was a lot of fun talking to her about our grandkids.  I will try some oxybutynin (10 mg p.o. daily) to try to help with her hot flashes.    Volanda Napoleon, MD 8/16/20199:55 AM

## 2017-09-28 NOTE — Addendum Note (Signed)
Addended by: Shary Decamp B on: 09/28/2017 11:47 AM   Modules accepted: Orders

## 2017-10-23 ENCOUNTER — Telehealth: Payer: Self-pay | Admitting: *Deleted

## 2017-10-23 ENCOUNTER — Encounter: Payer: Self-pay | Admitting: Gastroenterology

## 2017-10-23 MED ORDER — VENLAFAXINE HCL ER 37.5 MG PO CP24: 37.5000 mg | ORAL_CAPSULE | Freq: Every day | ORAL | 0 refills | Status: DC

## 2017-10-23 NOTE — Telephone Encounter (Signed)
Patient called to follow up from [place] on 04/09/17 regarding hot flashes, she did try the Ditropan prescribed by Dr.Ennever and took for 1 month and stopped because it made her sick.  Michela Pitcher you spoke with her about different medication and would like to try to help with hot flashes. Please advise

## 2017-10-23 NOTE — Telephone Encounter (Signed)
We talked about Effexor and if that switch she would like to try then Effexor 37.5 mg XR 1 p.o. daily #30.  If she does not notice significant benefit after several weeks then will increase her to the 75 mg dose but will start with a low dose first to see how she does.  Call at the end of those 30 days to let us know regardless so we can refill her or adjust the dose.  This technically is not antidepressant/anti-anxiety medication.  If she would start to feel more depressed side effects let us know.

## 2017-10-23 NOTE — Telephone Encounter (Signed)
Patient aware, Rx sent.  

## 2017-11-13 ENCOUNTER — Other Ambulatory Visit: Payer: Self-pay | Admitting: Hematology & Oncology

## 2017-11-13 DIAGNOSIS — Z1231 Encounter for screening mammogram for malignant neoplasm of breast: Secondary | ICD-10-CM

## 2017-11-17 ENCOUNTER — Other Ambulatory Visit: Payer: Self-pay | Admitting: Gynecology

## 2017-12-01 ENCOUNTER — Encounter: Payer: Self-pay | Admitting: Gastroenterology

## 2017-12-01 ENCOUNTER — Ambulatory Visit: Payer: BC Managed Care – PPO | Admitting: Gastroenterology

## 2017-12-01 ENCOUNTER — Other Ambulatory Visit: Payer: Self-pay | Admitting: Gastroenterology

## 2017-12-01 VITALS — BP 96/58 | HR 48 | Ht 65.0 in | Wt 149.0 lb

## 2017-12-01 DIAGNOSIS — R1310 Dysphagia, unspecified: Secondary | ICD-10-CM | POA: Diagnosis not present

## 2017-12-01 DIAGNOSIS — R1319 Other dysphagia: Secondary | ICD-10-CM

## 2017-12-01 DIAGNOSIS — Z1211 Encounter for screening for malignant neoplasm of colon: Secondary | ICD-10-CM

## 2017-12-01 MED ORDER — PEG-KCL-NACL-NASULF-NA ASC-C 140 G PO SOLR: 140.0000 g | ORAL | 0 refills | Status: DC

## 2017-12-01 NOTE — Progress Notes (Signed)
[facility] Consult Note:  History: Rachel [name] [facility] 12/01/2017  Primary care physician: Susy Frizzle, MD  Reason for consult/chief complaint: Colon Cancer Screening and Dysphagia (had UGI 8/18)   Subjective  HPI:  Rachel [name] was self referred for chronic dysphagia, which is been going on for a few years.  Sometimes while eating, will feel like food just "stops" or builds up in the esophagus before it finally passes.  It does not seem to cause much chest pain or pressure and she does not have to bring food back up.  She has rare episodes of heartburn or regurgitation, usually with trigger foods.  She does not consider that a problem, nor has she felt the need to take medicines for it. She has no change in bowel habits or rectal bleeding.  There is no family history of colorectal cancer and she is interested in colon cancer screening.  Rachel [name] underwent upper GI series August 2018 that was a normal study as described below.  She was somewhat concerned because of her history of breast cancer in chemoradiation treatment for that, even though it was many years ago.  ROS:  Review of Systems  Constitutional: Negative for appetite change and unexpected weight change.  HENT: Negative for mouth sores and voice change.   Eyes: Negative for pain and redness.  Respiratory: Negative for cough and shortness of breath.   Cardiovascular: Negative for chest pain and palpitations.  Genitourinary: Negative for dysuria and hematuria.  Musculoskeletal: Negative for arthralgias and myalgias.  Skin: Negative for pallor and rash.  Neurological: Positive for headaches. Negative for weakness.  Hematological: Negative for adenopathy.     Past Medical History: Past Medical History:  Diagnosis Date  . ASCUS (atypical squamous cells of undetermined significance) on Pap smear 02/2006, 05/2006   NEG HPV ----- PAP 12/2007 WNL  . Cancer ([place]) 2001   HISTORY OF STAGE I INFILTRATING DUCTAL  CARCINOMA OF RIGHT BREAST  . Chiari malformation type I ([place])   . Chronic headache   . Family history of uterine cancer   . History of breast cancer 2000  . Hypercholesteremia   . Hypothyroidism   . Osteoporosis 09/2016   T score of -2.5 statistically significant increased right hip, stable left hip and AP spine  . Personal history of chemotherapy 2001  . Personal history of radiation therapy 2001     Past Surgical History: Past Surgical History:  Procedure Laterality Date  . BREAST EXCISIONAL BIOPSY Left   . BREAST LUMPECTOMY Right 2001  . CESAREAN SECTION    . COLPOSCOPY    . PELVIC LAPAROSCOPY    . TONSILLECTOMY       Family History: Family History  Problem Relation Age of Onset  . Diabetes Father   . Uterine cancer Maternal Aunt 60    Social History: Social History   Socioeconomic History  . Marital status: Married    Spouse name: Not on file  . Number of children: Not on file  . Years of education: Not on file  . Highest education level: Not on file  Occupational History  . Not on file  Social Needs  . Financial resource strain: Not on file  . Food insecurity:    Worry: Not on file    Inability: Not on file  . Transportation needs:    Medical: Not on file    Non-medical: Not on file  Tobacco Use  . Smoking status: Never Smoker  . Smokeless tobacco: Never Used  .  Tobacco comment: never used tobacco  Substance and Sexual Activity  . Alcohol use: Yes    Alcohol/week: 0.0 standard drinks    Comment: social  . Drug use: No  . Sexual activity: Yes    Birth control/protection: Post-menopausal    Comment: 1st intercourse 52 yo-Fewer than 5 partners  Lifestyle  . Physical activity:    Days per week: Not on file    Minutes per session: Not on file  . Stress: Not on file  Relationships  . Social connections:    Talks on phone: Not on file    Gets together: Not on file    Attends religious service: Not on file    Active member of club or  organization: Not on file    Attends meetings of clubs or organizations: Not on file    Relationship status: Not on file  Other Topics Concern  . Not on file  Social History Narrative  . Not on file    Allergies: Allergies  Allergen Reactions  . Erythromycin Nausea And Vomiting  . Erythromycin Base     Outpatient Meds: Current Outpatient Medications  Medication Sig Dispense Refill  . butalbital-acetaminophen-caffeine (FIORICET, ESGIC) 50-325-40 MG per tablet TAKE 1 TABLET BY MOUTH EVERY 6 HOURS AS NEEDED FOR HEADACHE 20 tablet 0  . Cholecalciferol (VITAMIN D PO) Take 2,000 Units by mouth.    . fluticasone (FLONASE) 50 MCG/ACT nasal spray INHALE 2 SPRASY IN EACH NOSTRIL ONCE DAILY 16 g 11  . promethazine (PHENERGAN) 25 MG tablet TAKE 1 TABLET BY MOUTH EVERY 8 HOURS AS NEEDED FOR NAUSEA/VOMITING 20 tablet 0  . rizatriptan (MAXALT) 10 MG tablet TAKE 1 TABLET BY MOUTH EVERY DAY AS NEEDED FOR MIGRANE 6 tablet 1  . UNITHROID 100 MCG tablet Take 1 tablet (100 mcg total) by mouth daily before breakfast. 90 tablet 3  . venlafaxine XR (EFFEXOR XR) 37.5 MG 24 hr capsule Take 1 capsule (37.5 mg total) by mouth daily with breakfast. 30 capsule 0  . PEG-KCl-NaCl-NaSulf-Na Asc-C (PLENVU) 140 g SOLR Take 140 g by mouth as directed. 1 each 0   No current facility-administered medications for this visit.       ___________________________________________________________________ Objective   Exam:  BP (!) 96/58   Pulse (!) 48   Ht 5\' 5"  (1.651 m)   Wt 149 lb (67.6 kg)   BMI 24.79 kg/m    General: this is a(n) well-appearing woman with normal vocal quality  Eyes: sclera anicteric, no redness  ENT: oral mucosa moist without lesions, no cervical or supraclavicular lymphadenopathy, good dentition  CV: RRR without murmur, S1/S2, no JVD, no peripheral edema  Resp: clear to auscultation bilaterally, normal RR and effort noted  GI: soft, no tenderness, with active bowel sounds. No guarding  or palpable organomegaly noted.  Skin; warm and dry, no rash or jaundice noted  Neuro: awake, alert and oriented x 3. Normal gross motor function and fluent speech Radiologic Studies:  10/01/16 UGIS:  KUB shows a normal bowel gas pattern. No concerning mass effect or calcification. Lung bases are clear.   Oblique pharyngeal imaging shows good airway protection and no obstructive process or diverticulum.   The esophagus has normal distensibility and mucosal contour. Motility was normal. Gastroesophageal reflux to the thoracic inlet was seen when recumbent. Tiny transient hiatal hernia.   The stomach has normal distensibility and shape. No ulceration or masslike fold thickening is seen along the stomach or duodenum.   IMPRESSION: 1. Gastroesophageal reflux when recumbent.  Tiny, transient sliding hiatal hernia. 2. Otherwise negative.     Electronically Signed   By: Monte Fantasia M.D.   On: 10/01/2016 10:47   Assessment: Encounter Diagnoses  Name Primary?  . Esophageal dysphagia Yes  . Special screening for malignant neoplasms, colon     Chronic, nonprogressive dysphagia of unclear cause.  There may be a mild lesion such as a ring not evident on barium study, especially since a barium tablet was not used.  Mucosal diseases such as reflux esophagitis or eosinophilic esophagitis would not be evident on a barium study.  Could also be a motility issue.  Plan:  EGD and screening colonoscopy.  She is agreeable after discussion of procedure and risks.  The benefits and risks of the planned procedure were described in detail with the patient or (when appropriate) their health care proxy.  Risks were outlined as including, but not limited to, bleeding, infection, perforation, adverse medication reaction leading to cardiac or pulmonary decompensation, or pancreatitis (if ERCP).  The limitation of incomplete mucosal visualization was also discussed.  No guarantees or warranties were  given.   Thank you for the courtesy of this consult.  Please call me with any questions or concerns.  Nelida Meuse III  CC: Susy Frizzle, MD

## 2017-12-01 NOTE — Patient Instructions (Signed)
If you are age 51 or older, your body mass index should be between 23-30. Your Body mass index is 24.79 kg/m. If this is out of the aforementioned range listed, please consider follow up with your Primary Care Provider.  If you are age 65 or younger, your body mass index should be between 19-25. Your Body mass index is 24.79 kg/m. If this is out of the aformentioned range listed, please consider follow up with your Primary Care Provider.   You have been scheduled for an endoscopy and colonoscopy. Please follow the written instructions given to you at your visit today. Please pick up your prep supplies at the pharmacy within the next 1-3 days. If you use inhalers (even only as needed), please bring them with you on the day of your procedure. Your physician has requested that you go to www.startemmi.com and enter the access code given to you at your visit today. This web site gives a general overview about your procedure. However, you should still follow specific instructions given to you by our office regarding your preparation for the procedure.  It was a pleasure to see you today!  Dr. Loletha Carrow

## 2017-12-18 ENCOUNTER — Ambulatory Visit: Payer: BC Managed Care – PPO

## 2018-01-01 ENCOUNTER — Ambulatory Visit: Payer: BC Managed Care – PPO | Admitting: Family Medicine

## 2018-01-01 ENCOUNTER — Other Ambulatory Visit: Payer: Self-pay

## 2018-01-01 ENCOUNTER — Encounter: Payer: Self-pay | Admitting: Family Medicine

## 2018-01-01 ENCOUNTER — Ambulatory Visit
Admission: RE | Admit: 2018-01-01 | Discharge: 2018-01-01 | Disposition: A | Discharge status: Home / Self Care | Payer: BC Managed Care – PPO | Source: Ambulatory Visit | Attending: Family Medicine | Admitting: Family Medicine

## 2018-01-01 VITALS — BP 102/58 | HR 56 | Temp 98.3°F | Resp 16 | Ht 65.0 in | Wt 147.0 lb

## 2018-01-01 DIAGNOSIS — J189 Pneumonia, unspecified organism: Secondary | ICD-10-CM

## 2018-01-01 DIAGNOSIS — J181 Lobar pneumonia, unspecified organism: Secondary | ICD-10-CM

## 2018-01-01 DIAGNOSIS — R002 Palpitations: Secondary | ICD-10-CM | POA: Diagnosis not present

## 2018-01-01 DIAGNOSIS — J0100 Acute maxillary sinusitis, unspecified: Secondary | ICD-10-CM

## 2018-01-01 LAB — CBC WITH DIFFERENTIAL/PLATELET
BASOS PCT: 0.8 %
Basophils Absolute: 68 cells/uL (ref 0–200)
EOS ABS: 213 {cells}/uL (ref 15–500)
Eosinophils Relative: 2.5 %
HCT: 39.7 % (ref 35.0–45.0)
Hemoglobin: 13.6 g/dL (ref 11.7–15.5)
Lymphs Abs: 3528 cells/uL (ref 850–3900)
MCH: 30.5 pg (ref 27.0–33.0)
MCHC: 34.3 g/dL (ref 32.0–36.0)
MCV: 89 fL (ref 80.0–100.0)
MPV: 10 fL (ref 7.5–12.5)
Monocytes Relative: 8.3 %
NEUTROS PCT: 46.9 %
Neutro Abs: 3987 cells/uL (ref 1500–7800)
PLATELETS: 306 10*3/uL (ref 140–400)
RBC: 4.46 10*6/uL (ref 3.80–5.10)
RDW: 12.5 % (ref 11.0–15.0)
TOTAL LYMPHOCYTE: 41.5 %
WBC: 8.5 10*3/uL (ref 3.8–10.8)
WBCMIX: 706 {cells}/uL (ref 200–950)

## 2018-01-01 LAB — BASIC METABOLIC PANEL
BUN: 19 mg/dL (ref 7–25)
CHLORIDE: 107 mmol/L (ref 98–110)
CO2: 29 mmol/L (ref 20–32)
Calcium: 9.1 mg/dL (ref 8.6–10.4)
Creat: 0.87 mg/dL (ref 0.50–1.05)
Glucose, Bld: 84 mg/dL (ref 65–99)
Potassium: 4.4 mmol/L (ref 3.5–5.3)
Sodium: 141 mmol/L (ref 135–146)

## 2018-01-01 MED ORDER — AMOXICILLIN-POT CLAVULANATE 875-125 MG PO TABS: 1.0000 | ORAL_TABLET | Freq: Two times a day (BID) | ORAL | 0 refills | Status: DC

## 2018-01-01 NOTE — Patient Instructions (Signed)
[place] [address] [facility])  We will call with results F/U pending results

## 2018-01-01 NOTE — Progress Notes (Signed)
   Subjective:    Patient ID: Rachel [name], female    DOB: Mar 09, 1965, 52 y.o.   MRN: [id number]  Patient presents for Cough (x2 weeks- has been treated for PNA- still has cough with metallic taste in mouth) and Palpitations (feels like heart is "dropping")  [facility]  2 weeks ago, cough with congestion, fever, aches, seen by UC, told she had PNA , treated with steroids - Medrol pak , antibiotics - doxycycline100mg  tiwce a day ,  Flu test was negative   [facility]  When she coughs has metallic taste in mouth, No longer on any cough suppressants  Nasal congestion, sinus tenderness Teaching multiple exposiures    Review Of Systems:  GEN- denies fatigue, fever, weight loss,weakness, recent illness HEENT- denies eye drainage, change in vision,+ nasal discharge, CVS- denies chest pain,+ palpitations RESP- denies SOB, +cough, wheeze ABD- denies N/V, change in stools, abd pain GU- denies dysuria, hematuria, dribbling, incontinence MSK- denies joint pain, muscle aches, injury Neuro- denies headache, dizziness, syncope, seizure activity       Objective:    BP (!) 102/58   Pulse (!) 56   Temp 98.3 F (36.8 C) (Oral)   Resp 16   Ht 5\' 5"  (1.651 m)   Wt 147 lb (66.7 kg)   SpO2 96%   BMI 24.46 kg/m  GEN- NAD, alert and oriented x3 HEENT- PERRL, EOMI, non injected sclera, pink conjunctiva, MMM, oropharynx mild injection, TM clear bilat no effusion,  + left  maxillary sinus tenderness, inflammed turbinates,  Nasal drainage  Neck- Supple, no LAD CVS- RRR, no murmur RESP-CTAB EXT- No edema Pulses- Radial 2+  EKG- sinus bradycardia, no ST changes         Assessment & Plan:      Problem List Items Addressed This Visit    None    Visit Diagnoses    Palpitations    -  Primary   Relevant Orders   EKG 12-Lead (Completed)   CBC with Differential/Platelet (Completed)   Basic metabolic panel (Completed)   Pneumonia of left lower lobe due to infectious organism ([place])        Repeat CXR obtained - Negative for infection, STAT Labs normal. EKG resassuring. Palpitation likely from initially infection   Relevant Medications   amoxicillin-clavulanate (AUGMENTIN) 875-125 MG tablet   Other Relevant Orders   DG Chest 2 View (Completed)   CBC with Differential/Platelet (Completed)   Basic metabolic panel (Completed)   Acute non-recurrent maxillary sinusitis       Now with sinusitis, treat augmentin, nasal rinse/steroids   Relevant Medications   amoxicillin-clavulanate (AUGMENTIN) 875-125 MG tablet      Note: This dictation was prepared with Dragon dictation along with smaller phrase technology. Any transcriptional errors that result from this process are unintentional.

## 2018-01-03 ENCOUNTER — Encounter: Payer: Self-pay | Admitting: Family Medicine

## 2018-01-27 ENCOUNTER — Encounter: Payer: BC Managed Care – PPO | Admitting: Gastroenterology

## 2018-01-28 ENCOUNTER — Ambulatory Visit
Admission: RE | Admit: 2018-01-28 | Discharge: 2018-01-28 | Disposition: A | Discharge status: Home / Self Care | Payer: BC Managed Care – PPO | Source: Ambulatory Visit | Attending: Hematology & Oncology | Admitting: Hematology & Oncology

## 2018-01-28 DIAGNOSIS — Z1231 Encounter for screening mammogram for malignant neoplasm of breast: Secondary | ICD-10-CM

## 2018-02-23 ENCOUNTER — Encounter: Payer: Self-pay | Admitting: Gastroenterology

## 2018-02-23 ENCOUNTER — Ambulatory Visit (AMBULATORY_SURGERY_CENTER): Payer: BC Managed Care – PPO | Admitting: Gastroenterology

## 2018-02-23 VITALS — BP 141/95 | HR 80 | Temp 98.6°F | Resp 16 | Ht 65.0 in | Wt 149.0 lb

## 2018-02-23 DIAGNOSIS — Z1211 Encounter for screening for malignant neoplasm of colon: Secondary | ICD-10-CM

## 2018-02-23 DIAGNOSIS — D123 Benign neoplasm of transverse colon: Secondary | ICD-10-CM

## 2018-02-23 DIAGNOSIS — D122 Benign neoplasm of ascending colon: Secondary | ICD-10-CM

## 2018-02-23 DIAGNOSIS — R1310 Dysphagia, unspecified: Secondary | ICD-10-CM

## 2018-02-23 DIAGNOSIS — R1319 Other dysphagia: Secondary | ICD-10-CM

## 2018-02-23 DIAGNOSIS — K222 Esophageal obstruction: Secondary | ICD-10-CM | POA: Diagnosis not present

## 2018-02-23 MED ORDER — SODIUM CHLORIDE 0.9 % IV SOLN: 500.0000 mL | Freq: Once | INTRAVENOUS | Status: DC

## 2018-02-23 NOTE — Progress Notes (Signed)
PT taken to PACU. Monitors in place. VSS. Report given to RN. 

## 2018-02-23 NOTE — Op Note (Signed)
[place] Patient Name: Rachel [name] Procedure Date: 02/23/2018 1:32 PM MRN: [id number] Endoscopist: Mallie Mussel L. Loletha Carrow , MD Age: 53 Referring MD:  Date of Birth: Oct 03, 1965 Gender: Female Account #: [id number] Procedure:                Upper GI endoscopy Indications:              Dysphagia Medicines:                Monitored Anesthesia Care Procedure:                Pre-Anesthesia Assessment:                           - Prior to the procedure, a History and Physical                            was performed, and patient medications and                            allergies were reviewed. The patient's tolerance of                            previous anesthesia was also reviewed. The risks                            and benefits of the procedure and the sedation                            options and risks were discussed with the patient.                            All questions were answered, and informed consent                            was obtained. Prior Anticoagulants: The patient has                            taken no previous anticoagulant or antiplatelet                            agents. ASA Grade Assessment: II - A patient with                            mild systemic disease. After reviewing the risks                            and benefits, the patient was deemed in                            satisfactory condition to undergo the procedure.                           After obtaining informed consent, the endoscope was  passed under direct vision. Throughout the                            procedure, the patient's blood pressure, pulse, and                            oxygen saturations were monitored continuously. The                            Endoscope was introduced through the mouth, and                            advanced to the second part of duodenum. The upper                            GI endoscopy was accomplished without difficulty.                             The patient tolerated the procedure well. Scope In: Scope Out: Findings:                 A widely patent Schatzki ring was found at the                            gastroesophageal junction. Biopsies were taken with                            a cold forceps for histology. This was biopsied                            with a cold forceps for partial resection of ring                            (tissue discarded).                           The exam of the esophagus was otherwise normal.                           The stomach was normal.                           The cardia and gastric fundus were normal on                            retroflexion.                           The examined duodenum was normal. Complications:            No immediate complications. Estimated Blood Loss:     Estimated blood loss was minimal. Impression:               - Widely patent Schatzki ring. Biopsied.                           -  Normal stomach.                           - Normal examined duodenum. Recommendation:           - Patient has a contact number available for                            emergencies. The signs and symptoms of potential                            delayed complications were discussed with the                            patient. Return to normal activities tomorrow.                            Written discharge instructions were provided to the                            patient.                           - Resume previous diet.                           - Continue present medications. Henry L. Loletha Carrow, MD 02/23/2018 2:25:46 PM This report has been signed electronically.

## 2018-02-23 NOTE — Op Note (Signed)
[facility] Patient Name: Rachel [name] Procedure Date: 02/23/2018 1:32 PM MRN: [id number] Endoscopist: Mallie Mussel L. Loletha Carrow , MD Age: 53 Referring MD:  Date of Birth: 01/03/1966 Gender: Female Account #: [id number] Procedure:                Colonoscopy Indications:              Screening for colorectal malignant neoplasm, This                            is the patient's first colonoscopy Medicines:                Monitored Anesthesia Care Procedure:                Pre-Anesthesia Assessment:                           - Prior to the procedure, a History and Physical                            was performed, and patient medications and                            allergies were reviewed. The patient's tolerance of                            previous anesthesia was also reviewed. The risks                            and benefits of the procedure and the sedation                            options and risks were discussed with the patient.                            All questions were answered, and informed consent                            was obtained. Prior Anticoagulants: The patient has                            taken no previous anticoagulant or antiplatelet                            agents. ASA Grade Assessment: II - A patient with                            mild systemic disease. After reviewing the risks                            and benefits, the patient was deemed in                            satisfactory condition to undergo the procedure.  After obtaining informed consent, the colonoscope                            was passed under direct vision. Throughout the                            procedure, the patient's blood pressure, pulse, and                            oxygen saturations were monitored continuously. The                            Model CF-HQ190L [id number]) scope was introduced                            through the anus and  advanced to the the cecum,                            identified by appendiceal orifice and ileocecal                            valve. The colonoscopy was performed without                            difficulty. The patient tolerated the procedure                            well. The quality of the bowel preparation was                            excellent. The ileocecal valve, appendiceal                            orifice, and rectum were photographed. Scope In: 1:39:07 PM Scope Out: 1:56:19 PM Scope Withdrawal Time: 0 hours 14 minutes 35 seconds  Total Procedure Duration: 0 hours 17 minutes 12 seconds  Findings:                 The perianal and digital rectal examinations were                            normal.                           A diminutive polyp was found in the ascending                            colon. The polyp was sessile. The polyp was removed                            with a cold snare. Resection and retrieval were                            complete. (Jar 1)  A diminutive polyp was found in the ascending                            colon. The polyp was sessile. The polyp was removed                            with a cold biopsy forceps. Resection and retrieval                            were complete. (Jar 1)                           A diminutive polyp was found in the transverse                            colon. The polyp was sessile. The polyp was removed                            with a cold snare. Resection and retrieval were                            complete. (Jar 2)                           The exam was otherwise without abnormality on                            direct and retroflexion views. Complications:            No immediate complications. Estimated Blood Loss:     Estimated blood loss was minimal. Impression:               - One diminutive polyp in the ascending colon,                            removed with a cold snare.  Resected and retrieved.                           - One diminutive polyp in the ascending colon,                            removed with a cold biopsy forceps. Resected and                            retrieved.                           - One diminutive polyp in the transverse colon,                            removed with a cold snare. Resected and retrieved.                           - The examination was otherwise normal on direct  and retroflexion views. Recommendation:           - Patient has a contact number available for                            emergencies. The signs and symptoms of potential                            delayed complications were discussed with the                            patient. Return to normal activities tomorrow.                            Written discharge instructions were provided to the                            patient.                           - Resume previous diet.                           - Continue present medications.                           - Await pathology results.                           - Repeat colonoscopy is recommended for                            surveillance. The colonoscopy date will be                            determined after pathology results from today's                            exam become available for review. Nigel Wessman L. Loletha Carrow, MD 02/23/2018 2:20:14 PM This report has been signed electronically.

## 2018-02-23 NOTE — Progress Notes (Signed)
Called to room to assist during endoscopic procedure.  Patient ID and intended procedure confirmed with present staff. Received instructions for my participation in the procedure from the performing physician.  

## 2018-02-23 NOTE — Patient Instructions (Signed)
Thank you for allowing [place] to care for you today!  Await pathology results by mail, approximately 2 weeks.  Recommendation for next colonoscopy will be made at that time.   Resume previous diet and medications today.  Return to normal activities tomorrow.  Handout for polyps given.      YOU HAD AN ENDOSCOPIC PROCEDURE TODAY AT [facility]:   Refer to the procedure report that was given to you for any specific questions about what was found during the examination.  If the procedure report does not answer your questions, please call your gastroenterologist to clarify.  If you requested that your care partner not be given the details of your procedure findings, then the procedure report has been included in a sealed envelope for you to review at your convenience later.  YOU SHOULD EXPECT: Some feelings of bloating in the abdomen. Passage of more gas than usual.  Walking can help get rid of the air that was put into your GI tract during the procedure and reduce the bloating. If you had a lower endoscopy (such as a colonoscopy or flexible sigmoidoscopy) you may notice spotting of blood in your stool or on the toilet paper. If you underwent a bowel prep for your procedure, you may not have a normal bowel movement for a few days.  Please Note:  You might notice some irritation and congestion in your nose or some drainage.  This is from the oxygen used during your procedure.  There is no need for concern and it should clear up in a day or so.  SYMPTOMS TO REPORT IMMEDIATELY:   Following lower endoscopy (colonoscopy or flexible sigmoidoscopy):  Excessive amounts of blood in the stool  Significant tenderness or worsening of abdominal pains  Swelling of the abdomen that is new, acute  Fever of 100F or higher   Following upper endoscopy (EGD)  Vomiting of blood or coffee ground material  New chest pain or pain under the shoulder blades  Painful or persistently difficult  swallowing  New shortness of breath  Fever of 100F or higher  Black, tarry-looking stools  For urgent or emergent issues, a gastroenterologist can be reached at any hour by calling [phone].   DIET:  We do recommend a small meal at first, but then you may proceed to your regular diet.  Drink plenty of fluids but you should avoid alcoholic beverages for 24 hours.  ACTIVITY:  You should plan to take it easy for the rest of today and you should NOT DRIVE or use heavy machinery until tomorrow (because of the sedation medicines used during the test).    FOLLOW UP: Our staff will call the number listed on your records the next business day following your procedure to check on you and address any questions or concerns that you may have regarding the information given to you following your procedure. If we do not reach you, we will leave a message.  However, if you are feeling well and you are not experiencing any problems, there is no need to return our call.  We will assume that you have returned to your regular daily activities without incident.  If any biopsies were taken you will be contacted by phone or by letter within the next 1-3 weeks.  Please call us at [phone] if you have not heard about the biopsies in 3 weeks.    SIGNATURES/CONFIDENTIALITY: You and/or your care partner have signed paperwork which will be entered into your  electronic medical record.  These signatures attest to the fact that that the information above on your After Visit Summary has been reviewed and is understood.  Full responsibility of the confidentiality of this discharge information lies with you and/or your care-partner.

## 2018-02-24 ENCOUNTER — Telehealth: Payer: Self-pay | Admitting: *Deleted

## 2018-02-24 NOTE — Telephone Encounter (Signed)
  Follow up Call-  Call back number 02/23/2018  Post procedure Call Back phone  # [phone]  Permission to leave phone message Yes  Some recent data might be hidden     Patient questions:  Do you have a fever, pain , or abdominal swelling? Yes.   Pain Score  3 *  States had a fever of 102 last night. Did not call anyone.  States sore throat about #3 now. No fever now.  Have you tolerated food without any problems? Yes.  Soft  Have you been able to return to your normal activities? Yes.    Do you have any questions about your discharge instructions: Diet   No. Medications  No. Follow up visit  No.  Do you have questions or concerns about your Care? No.  Actions: * If pain score is 4 or above: No action needed, pain <4.

## 2018-02-24 NOTE — Telephone Encounter (Signed)
Fever occasionally occurs as a delayed response to the propofol.  It is not an allergic reaction.    Tylenol as needed, I expect sore throat will subside.  Call us back as needed.

## 2018-03-02 NOTE — Telephone Encounter (Signed)
Pt aware.

## 2018-03-04 ENCOUNTER — Encounter: Payer: Self-pay | Admitting: Gastroenterology

## 2018-03-13 DIAGNOSIS — R87610 Atypical squamous cells of undetermined significance on cytologic smear of cervix (ASC-US): Secondary | ICD-10-CM

## 2018-03-13 HISTORY — DX: Atypical squamous cells of undetermined significance on cytologic smear of cervix (ASC-US): R87.610

## 2018-03-25 ENCOUNTER — Ambulatory Visit: Payer: BC Managed Care – PPO | Admitting: Family Medicine

## 2018-03-25 ENCOUNTER — Encounter: Payer: Self-pay | Admitting: Family Medicine

## 2018-03-25 VITALS — BP 112/80 | HR 68 | Temp 97.8°F | Resp 16 | Ht 64.0 in | Wt 147.0 lb

## 2018-03-25 DIAGNOSIS — R05 Cough: Secondary | ICD-10-CM | POA: Diagnosis not present

## 2018-03-25 DIAGNOSIS — R058 Other specified cough: Secondary | ICD-10-CM

## 2018-03-25 MED ORDER — PANTOPRAZOLE SODIUM 40 MG PO TBEC: 40.0000 mg | DELAYED_RELEASE_TABLET | Freq: Every day | ORAL | 0 refills | Status: DC

## 2018-03-25 MED ORDER — LEVOCETIRIZINE DIHYDROCHLORIDE 5 MG PO TABS: 5.0000 mg | ORAL_TABLET | Freq: Every evening | ORAL | 0 refills | Status: DC

## 2018-03-25 NOTE — Progress Notes (Signed)
Subjective:    Patient ID: Rachel [name], female    DOB: [birth year], 53 y.o.   MRN: [id number]  HPI Patient was diagnosed with pneumonia in November.  She was treated with antibiotics and steroids.  She was seen by my partner in later November as her cough persisted and was diagnosed with a persistent sinus infection was given Augmentin.  Patient states that her cough has still not abated.  I reviewed the chest x-ray from November which showed no infiltrate.  It was completely clear.  The patient states that she does not feel sick.  However she continues to cough.  She states that she can cough 2-3 times every hour.  The cough is nonproductive.  She denies any hemoptysis.  She denies any wheezing.  She denies any chest pain.  She does have chronic sinusitis however she denies any significant rhinorrhea or sinus pain.  She is taking her Flonase.  She has been having heartburn.  She denies any fevers or chills or night sweats or weight loss.  She has been having more migraines.  She states that she is had 8 migraines in the last 2 weeks which is very frequent for her.  Otherwise, the patient states that she feels more rundown.  She also states that she is been a little more easily winded with activity and that activity seems to trigger the cough.  She denies feeling a cough related to any environmental exposure that she can pinpoint. Past Medical History:  Diagnosis Date  . ASCUS (atypical squamous cells of undetermined significance) on Pap smear 02/2006, 05/2006   NEG HPV ----- PAP 12/2007 WNL  . 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 Chiari malformation type I ([place])   . Chronic headache   . Family history of uterine cancer   . History of breast cancer 2000  . Hypercholesteremia   . Hypothyroidism   . Osteoporosis 09/2016   T score of -2.5 statistically significant increased right hip, stable left hip and AP spine  . Personal history of  chemotherapy 2001  . Personal history of radiation therapy 2001   Past Surgical History:  Procedure Laterality Date  . BREAST EXCISIONAL BIOPSY Left   . BREAST LUMPECTOMY Right 2001  . CESAREAN SECTION    . COLPOSCOPY    . PELVIC LAPAROSCOPY    . TONSILLECTOMY     Current Outpatient Medications on File Prior to Visit  Medication Sig Dispense Refill  . butalbital-acetaminophen-caffeine (FIORICET, ESGIC) 50-325-40 MG per tablet TAKE 1 TABLET BY MOUTH EVERY 6 HOURS AS NEEDED FOR HEADACHE 20 tablet 0  . Cholecalciferol (VITAMIN D PO) Take 2,000 Units by mouth.    . fluticasone (FLONASE) 50 MCG/ACT nasal spray INHALE 2 SPRASY IN EACH NOSTRIL ONCE DAILY 16 g 11  . promethazine (PHENERGAN) 25 MG tablet TAKE 1 TABLET BY MOUTH EVERY 8 HOURS AS NEEDED FOR NAUSEA/VOMITING 20 tablet 0  . rizatriptan (MAXALT) 10 MG tablet TAKE 1 TABLET BY MOUTH EVERY DAY AS NEEDED FOR MIGRANE 6 tablet 1  . UNITHROID 100 MCG tablet Take 1 tablet (100 mcg total) by mouth daily before breakfast. 90 tablet 3   No current facility-administered medications on file prior to visit.    Allergies  Allergen Reactions  . Erythromycin Nausea And Vomiting   Social History   Socioeconomic History  . Marital status: Married    Spouse name: Not on file  . Number of children:  Not on file  . Years of education: Not on file  . Highest education level: Not on file  Occupational History  . Not on file  Social Needs  . Financial resource strain: Not on file  . Food insecurity:    Worry: Not on file    Inability: Not on file  . Transportation needs:    Medical: Not on file    Non-medical: Not on file  Tobacco Use  . Smoking status: Never Smoker  . Smokeless tobacco: Never Used  . Tobacco comment: never used tobacco  Substance and Sexual Activity  . Alcohol use: Yes    Alcohol/week: 0.0 standard drinks    Comment: social  . Drug use: No  . Sexual activity: Yes    Birth control/protection: Post-menopausal     Comment: 1st intercourse 53 yo-Fewer than 5 partners  Lifestyle  . Physical activity:    Days per week: Not on file    Minutes per session: Not on file  . Stress: Not on file  Relationships  . Social connections:    Talks on phone: Not on file    Gets together: Not on file    Attends religious service: Not on file    Active member of club or organization: Not on file    Attends meetings of clubs or organizations: Not on file    Relationship status: Not on file  . Intimate partner violence:    Fear of current or ex partner: Not on file    Emotionally abused: Not on file    Physically abused: Not on file    Forced sexual activity: Not on file  Other Topics Concern  . Not on file  Social History Narrative  . Not on file      Review of Systems  All other systems reviewed and are negative.      Objective:   Physical Exam Vitals signs reviewed.  Constitutional:      General: She is not in acute distress.    Appearance: Normal appearance. She is normal weight. She is not ill-appearing, toxic-appearing or diaphoretic.  HENT:     Head: Normocephalic and atraumatic.     Right Ear: Tympanic membrane, ear canal and external ear normal. There is no impacted cerumen.     Left Ear: Tympanic membrane, ear canal and external ear normal. There is no impacted cerumen.     Nose: Nose normal. No congestion or rhinorrhea.  Eyes:     Conjunctiva/sclera: Conjunctivae normal.  Neck:     Musculoskeletal: Normal range of motion.  Cardiovascular:     Rate and Rhythm: Normal rate and regular rhythm.     Pulses: Normal pulses.     Heart sounds: Normal heart sounds. No murmur. No friction rub. No gallop.   Pulmonary:     Effort: Pulmonary effort is normal. No respiratory distress.     Breath sounds: Normal breath sounds. No stridor. No wheezing, rhonchi or rales.  Chest:     Chest wall: No tenderness.  Lymphadenopathy:     Cervical: No cervical adenopathy.  Neurological:     Mental  Status: She is alert.           Assessment & Plan:  Persistent cough/upper airway cough syndrome  Patient's exam today is normal.  Differential diagnosis includes cough variant asthma, postnasal drip due to chronic sinusitis, laryngo-esophageal reflux, or some combination thereof.  I doubt infectious etiology given the 72-month duration and the lack of concerning features.  Therefore  I will treat the patient empirically with Protonix 40 mg a day as well as Xyzal 5 mg a day and asked her to continue Flonase.  Recheck in 2 weeks and if cough persist at that time, proceed with repeat imaging of the chest and pulmonary function test.

## 2018-04-02 ENCOUNTER — Other Ambulatory Visit: Payer: BC Managed Care – PPO

## 2018-04-02 ENCOUNTER — Ambulatory Visit: Payer: BC Managed Care – PPO | Admitting: Family

## 2018-04-07 ENCOUNTER — Other Ambulatory Visit: Payer: Self-pay | Admitting: Endocrinology

## 2018-04-08 ENCOUNTER — Other Ambulatory Visit: Payer: Self-pay

## 2018-04-08 ENCOUNTER — Encounter: Payer: Self-pay | Admitting: Hematology & Oncology

## 2018-04-08 ENCOUNTER — Inpatient Hospital Stay: Payer: BC Managed Care – PPO | Attending: Hematology & Oncology

## 2018-04-08 ENCOUNTER — Inpatient Hospital Stay (HOSPITAL_BASED_OUTPATIENT_CLINIC_OR_DEPARTMENT_OTHER): Payer: BC Managed Care – PPO | Admitting: Hematology & Oncology

## 2018-04-08 VITALS — BP 111/54 | HR 59 | Temp 98.2°F | Resp 16 | Wt 150.5 lb

## 2018-04-08 DIAGNOSIS — Z853 Personal history of malignant neoplasm of breast: Secondary | ICD-10-CM | POA: Insufficient documentation

## 2018-04-08 DIAGNOSIS — R05 Cough: Secondary | ICD-10-CM

## 2018-04-08 DIAGNOSIS — C50611 Malignant neoplasm of axillary tail of right female breast: Secondary | ICD-10-CM

## 2018-04-08 DIAGNOSIS — Z171 Estrogen receptor negative status [ER-]: Principal | ICD-10-CM

## 2018-04-08 DIAGNOSIS — E7800 Pure hypercholesterolemia, unspecified: Secondary | ICD-10-CM

## 2018-04-08 DIAGNOSIS — E039 Hypothyroidism, unspecified: Secondary | ICD-10-CM

## 2018-04-08 LAB — CBC WITH DIFFERENTIAL (CANCER CENTER ONLY)
Abs Immature Granulocytes: 0.01 10*3/uL (ref 0.00–0.07)
Basophils Absolute: 0.1 10*3/uL (ref 0.0–0.1)
Basophils Relative: 1 %
EOS ABS: 0.2 10*3/uL (ref 0.0–0.5)
Eosinophils Relative: 3 %
HEMATOCRIT: 44.2 % (ref 36.0–46.0)
Hemoglobin: 14.6 g/dL (ref 12.0–15.0)
Immature Granulocytes: 0 %
LYMPHS ABS: 2.5 10*3/uL (ref 0.7–4.0)
Lymphocytes Relative: 36 %
MCH: 30.5 pg (ref 26.0–34.0)
MCHC: 33 g/dL (ref 30.0–36.0)
MCV: 92.3 fL (ref 80.0–100.0)
Monocytes Absolute: 0.4 10*3/uL (ref 0.1–1.0)
Monocytes Relative: 5 %
Neutro Abs: 3.8 10*3/uL (ref 1.7–7.7)
Neutrophils Relative %: 55 %
Platelet Count: 262 10*3/uL (ref 150–400)
RBC: 4.79 MIL/uL (ref 3.87–5.11)
RDW: 12.6 % (ref 11.5–15.5)
WBC Count: 6.9 10*3/uL (ref 4.0–10.5)
nRBC: 0 % (ref 0.0–0.2)

## 2018-04-08 LAB — CMP (CANCER CENTER ONLY)
ALT: 20 U/L (ref 0–44)
AST: 17 U/L (ref 15–41)
Albumin: 4.6 g/dL (ref 3.5–5.0)
Alkaline Phosphatase: 88 U/L (ref 38–126)
Anion gap: 4 — ABNORMAL LOW (ref 5–15)
BILIRUBIN TOTAL: 0.4 mg/dL (ref 0.3–1.2)
BUN: 17 mg/dL (ref 6–20)
CO2: 31 mmol/L (ref 22–32)
Calcium: 10.2 mg/dL (ref 8.9–10.3)
Chloride: 106 mmol/L (ref 98–111)
Creatinine: 0.91 mg/dL (ref 0.44–1.00)
GFR, Est AFR Am: >60 mL/min (ref >60)
GFR, Estimated: >60 mL/min (ref >60)
Glucose, Bld: 156 mg/dL — ABNORMAL HIGH (ref 70–99)
Potassium: 5.3 mmol/L — ABNORMAL HIGH (ref 3.5–5.1)
Sodium: 141 mmol/L (ref 135–145)
Total Protein: 7.1 g/dL (ref 6.5–8.1)

## 2018-04-08 NOTE — Progress Notes (Signed)
Hematology and Oncology Follow Up Visit  Rachel [name] [facility] [id number] January 19, 1966 53 y.o. 04/08/2018   Principle Diagnosis:  Stage I (T1 N0M0) infiltrating ductal carcinoma the right breast  Current Therapy:    Observation     Interim History:  [name] is back for followup.  She is now retired.  This is a big move for Rachel.  She is been a Pharmacist, hospital for 30 years.  I am just so excited that she is now able to retire.  She is able to collect a pension and have insurance from the state.  She is doing well from a physical point of view.  Rachel problem is that she is having this cough.  She sees Rachel family doctor.  She has had this cough for a couple weeks.  She is been on antibiotics.  I told Rachel that she might need to have pulmonary function studies done.  I suppose that it is possible that she may have adult onset asthma.  She also has had a headaches.  This has been a problem for Rachel.  She has had no nausea or vomiting.  She has had no change in bowel or bladder habits.  Rachel [name] was diagnosed with stage II colon cancer.  He had surgery in December.  He is having some issues.  Rachel last mammogram was back in December 2019.  Everything looked fine.  I think that she has seen genetic counseling.  I am not sure if she is had the genetic test.  I think that she has deferred any genetic testing.  She has seen the Dietitian.    Overall, Rachel performance status is ECOG 0.   Medications:  Current Outpatient Medications:  .  butalbital-acetaminophen-caffeine (FIORICET, ESGIC) 50-325-40 MG per tablet, TAKE 1 TABLET BY MOUTH EVERY 6 HOURS AS NEEDED FOR HEADACHE, Disp: 20 tablet, Rfl: 0 .  Cholecalciferol (VITAMIN D PO), Take 2,000 Units by mouth., Disp: , Rfl:  .  fluticasone (FLONASE) 50 MCG/ACT nasal spray, INHALE 2 SPRASY IN EACH NOSTRIL ONCE DAILY, Disp: 16 g, Rfl: 11 .  levocetirizine (XYZAL) 5 MG tablet, Take 1 tablet (5 mg total) by mouth every evening., Disp: 30 tablet, Rfl: 0 .   pantoprazole (PROTONIX) 40 MG tablet, Take 1 tablet (40 mg total) by mouth daily., Disp: 30 tablet, Rfl: 0 .  promethazine (PHENERGAN) 25 MG tablet, TAKE 1 TABLET BY MOUTH EVERY 8 HOURS AS NEEDED FOR NAUSEA/VOMITING, Disp: 20 tablet, Rfl: 0 .  rizatriptan (MAXALT) 10 MG tablet, TAKE 1 TABLET BY MOUTH EVERY DAY AS NEEDED FOR MIGRANE, Disp: 6 tablet, Rfl: 1 .  UNITHROID 100 MCG tablet, Take 1 tablet (100 mcg total) by mouth daily before breakfast., Disp: 90 tablet, Rfl: 3  Allergies:  Allergies  Allergen Reactions  . Erythromycin Nausea And Vomiting    Past Medical History, Surgical history, Social history, and Family History were reviewed and updated.  Review of Systems: Review of Systems  Constitutional: Negative.   HENT: Positive for congestion.   Eyes: Negative.   Respiratory: Negative.   Cardiovascular: Negative.   Gastrointestinal: Negative.   Genitourinary: Negative.   Musculoskeletal: Negative.   Skin: Negative.   Neurological: Negative.   Endo/Heme/Allergies: Negative.   Psychiatric/Behavioral: Negative.      Physical Exam:  weight is 150 lb 8 oz (68.3 kg). Rachel oral temperature is 98.2 F (36.8 C). Rachel blood pressure is 111/54 (abnormal) and Rachel pulse is 59 (abnormal). Rachel respiration is 16 and oxygen  saturation is 100%.   Well-developed and well-nourished white female. Head and neck exam shows no ocular or oral lesions. She has no palpable cervical or supraclavicular lymph nodes. Lungs are clear bilaterally. Cardiac exam regular rate and rhythm with no murmurs rubs or bruits. Abdomen is soft. Has good bowel sounds. There is no fluid wave. There is no palpable liver or spleen tip. Breast exam shows left breast no masses edema or erythema. There is no left axillary adenopathy. Right breast is slightly contracted from surgery and radiation. She has a well-healed lumpectomy at the 10:00 position. No distinct mass is noted in the right breast. There is no right axillary  adenopathy. Back exam shows no tenderness over the spine ribs or hips. Extremities shows no clubbing cyanosis or edema. Skin exam no rashes, ecchymosis or petechia. Neurological exam is nonfocal.  Lab Results  Component Value Date   WBC 6.9 04/08/2018   HGB 14.6 04/08/2018   HCT 44.2 04/08/2018   MCV 92.3 04/08/2018   PLT 262 04/08/2018     Chemistry      Component Value Date/Time   NA 141 04/08/2018 1138   NA 140 09/26/2016 0743   NA 143 09/21/2015 0854   K 5.3 (H) 04/08/2018 1138   K 3.8 09/26/2016 0743   K 4.4 09/21/2015 0854   CL 106 04/08/2018 1138   CL 104 09/26/2016 0743   CO2 31 04/08/2018 1138   CO2 30 09/26/2016 0743   CO2 28 09/21/2015 0854   BUN 17 04/08/2018 1138   BUN 14 09/26/2016 0743   BUN 13.9 09/21/2015 0854   CREATININE 0.91 04/08/2018 1138   CREATININE 0.87 01/01/2018 1055   CREATININE 0.8 09/21/2015 0854      Component Value Date/Time   CALCIUM 10.2 04/08/2018 1138   CALCIUM 9.4 09/26/2016 0743   CALCIUM 10.1 09/21/2015 0854   ALKPHOS 88 04/08/2018 1138   ALKPHOS 80 09/26/2016 0743   ALKPHOS 98 09/21/2015 0854   AST 17 04/08/2018 1138   AST 37 (H) 09/21/2015 0854   ALT 20 04/08/2018 1138   ALT 35 09/26/2016 0743   ALT 57 (H) 09/21/2015 0854   BILITOT 0.4 04/08/2018 1138   BILITOT 0.53 09/21/2015 0854        Impression and Plan: Rachel [name] is a 53 year old white female with a history of stage I ductal carcinoma of the right breast. She was diagnosed 15 years ago. 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 I would think that  she is cured.  I will go ahead and plan to get Rachel back in 6 more months.  I think this is reasonable amount of time.  She like to come back to see [place] every 6 months.  We actually were the ones to check Rachel cholesterol and thyroid.  She will continue to think about genetic testing.  She has 2 boys.  She has a grandson.  Rachel Napoleon, MD 2/27/202012:32 PM

## 2018-04-12 ENCOUNTER — Ambulatory Visit (INDEPENDENT_AMBULATORY_CARE_PROVIDER_SITE_OTHER): Payer: BC Managed Care – PPO | Admitting: Gynecology

## 2018-04-12 ENCOUNTER — Encounter: Payer: Self-pay | Admitting: Gynecology

## 2018-04-12 VITALS — BP 118/76 | Ht 64.0 in | Wt 149.0 lb

## 2018-04-12 DIAGNOSIS — Z01419 Encounter for gynecological examination (general) (routine) without abnormal findings: Secondary | ICD-10-CM

## 2018-04-12 DIAGNOSIS — M810 Age-related osteoporosis without current pathological fracture: Secondary | ICD-10-CM | POA: Diagnosis not present

## 2018-04-12 DIAGNOSIS — Z1151 Encounter for screening for human papillomavirus (HPV): Secondary | ICD-10-CM

## 2018-04-12 DIAGNOSIS — Z853 Personal history of malignant neoplasm of breast: Secondary | ICD-10-CM | POA: Diagnosis not present

## 2018-04-12 DIAGNOSIS — N952 Postmenopausal atrophic vaginitis: Secondary | ICD-10-CM | POA: Diagnosis not present

## 2018-04-12 NOTE — Addendum Note (Signed)
Addended by: Nelva Nay on: 04/12/2018 04:52 PM   Modules accepted: Orders

## 2018-04-12 NOTE — Patient Instructions (Signed)
Follow-up for bone density end of this year.

## 2018-04-12 NOTE — Progress Notes (Signed)
    [name] November 06, 1965 [id number]        52 y.o.  [id number] for annual gynecologic exam.  Without gynecologic complaints  Past medical history,surgical history, problem list, medications, allergies, family history and social history were all reviewed and documented as reviewed in the EPIC chart.  ROS:  Performed with pertinent positives and negatives included in the history, assessment and plan.   Additional significant findings : None   Exam: Caryn Bee assistant Vitals:   04/12/18 1605  BP: 118/76  Weight: 149 lb (67.6 kg)  Height: 5\' 4"  (1.626 m)   Body mass index is 25.58 kg/m.  General appearance:  Normal affect, orientation and appearance. Skin: Grossly normal HEENT: Without gross lesions.  No cervical or supraclavicular adenopathy. Thyroid normal.  Lungs:  Clear without wheezing, rales or rhonchi Cardiac: RR, without RMG Abdominal:  Soft, nontender, without masses, guarding, rebound, organomegaly or hernia Breasts:  Examined lying and sitting without masses, retractions, discharge or axillary adenopathy.  Well-healed right lumpectomy scar  Pelvic:  Ext, BUS, Vagina: With atrophic changes  Cervix: With atrophic changes  Uterus: Anteverted, normal size, shape and contour, midline and mobile nontender   Adnexa: Without masses or tenderness    Anus and perineum: Normal   Rectovaginal: Normal sphincter tone without palpated masses or tenderness.    Assessment/Plan:  53 y.o. G47P2002 female for annual gynecologic exam.   1. Postmenopausal.  Doing well without significant menopausal symptoms or any vaginal bleeding. 2. History of right breast cancer 2001.  Exam NED.  Mammography 01/2018.  Continue with annual mammography when due.  Continue to follow-up with oncology.  They have discussed genetic testing with her and she was counseled by genetic counselor and declined testing. 3. Colonoscopy 2020.  Repeat at their recommended interval. 4. Osteoporosis.  DEXA 2018 T score  -2.5.  We have discussed options for treatment and she elects for no treatment at this time.  Recommend follow-up DEXA end of this year at a 2-year interval. 5. Pap smear/HPV 2015.  Pap smear/HPV today.  No history of significant abnormal Pap smears previously. 6. Health maintenance.  No routine lab work done as patient reports is done elsewhere.  Follow-up 1 year, sooner as needed.   [name] MD, 4:26 PM 04/12/2018

## 2018-04-13 ENCOUNTER — Encounter: Payer: Self-pay | Admitting: Gynecology

## 2018-04-13 LAB — PAP IG AND HPV HIGH-RISK: HPV DNA HIGH RISK: NOT DETECTED

## 2018-04-19 ENCOUNTER — Telehealth: Payer: Self-pay | Admitting: Family Medicine

## 2018-04-19 NOTE — Telephone Encounter (Signed)
Pt called Rachel [name] stating that she fell > 1 week ago and her arm is still very tender and wanted to know if we could order an xray for her?

## 2018-04-20 NOTE — Telephone Encounter (Signed)
LMTRC

## 2018-04-20 NOTE — Telephone Encounter (Signed)
[place] with xray order but which arm?

## 2018-04-22 NOTE — Telephone Encounter (Signed)
No return call 

## 2018-05-10 ENCOUNTER — Other Ambulatory Visit: Payer: Self-pay

## 2018-05-10 ENCOUNTER — Ambulatory Visit (INDEPENDENT_AMBULATORY_CARE_PROVIDER_SITE_OTHER): Payer: BC Managed Care – PPO | Admitting: Family Medicine

## 2018-05-10 DIAGNOSIS — J029 Acute pharyngitis, unspecified: Secondary | ICD-10-CM | POA: Diagnosis not present

## 2018-05-10 NOTE — Progress Notes (Signed)
Subjective:    Patient ID: Rachel [name], female    DOB: 01/05/1966, 53 y.o.   MRN: [id number]  HPI Patient was seen today via telephone visit.  She is at home.  She consents to be seen by telephone.  I am currently my office.  Phone conversation began at 150 9 in the afternoon.  Phone conversation concluded at 2 11 in the afternoon.  Patient symptoms began Thursday.  They started with a sore throat.  Sore throat gradually progressed over Friday Saturday and Sunday.  It is steadily worsened over the weekend.  She denies any fever.  She does have a dry cough.  She also has congestion which she is attributed to her allergies which are severe.  However today she looked in the back of her throat and she see small numerous yellow ulcers and blisters on the peritonsillar folds.  She states that it hurts to even move her tongue due to blisters on the sides of her tongue.  Sounds like she has numerous canker sores on the sides of her tongue and in the back of her throat.  This would raise the concern for herpangina or some type of viral pharyngitis.  Denies any shortness of breath or chest pain or dyspnea on exertion or pleurisy. Past Medical History:  Diagnosis Date  . ASCUS (atypical squamous cells of undetermined significance) on Pap smear 02/2006, 05/2006   NEG HPV ----- PAP 12/2007 WNL  . ASCUS of cervix with negative high risk HPV 03/2018  . 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 Chiari malformation type I ([place])   . Chronic headache   . Family history of uterine cancer   . History of breast cancer 2000  . Hypercholesteremia   . Hypothyroidism   . Osteoporosis 09/2016   T score of -2.5 statistically significant increased right hip, stable left hip and AP spine  . Personal history of chemotherapy 2001  . Personal history of radiation therapy 2001   Past Surgical History:  Procedure Laterality Date  . BREAST EXCISIONAL BIOPSY Left   . BREAST  LUMPECTOMY Right 2001  . CESAREAN SECTION    . COLPOSCOPY    . PELVIC LAPAROSCOPY    . TONSILLECTOMY     Current Outpatient Medications on File Prior to Visit  Medication Sig Dispense Refill  . butalbital-acetaminophen-caffeine (FIORICET, ESGIC) 50-325-40 MG per tablet TAKE 1 TABLET BY MOUTH EVERY 6 HOURS AS NEEDED FOR HEADACHE 20 tablet 0  . Cholecalciferol (VITAMIN D PO) Take 2,000 Units by mouth.    . fluticasone (FLONASE) 50 MCG/ACT nasal spray INHALE 2 SPRASY IN EACH NOSTRIL ONCE DAILY 16 g 11  . levocetirizine (XYZAL) 5 MG tablet Take 1 tablet (5 mg total) by mouth every evening. 30 tablet 0  . pantoprazole (PROTONIX) 40 MG tablet Take 1 tablet (40 mg total) by mouth daily. 30 tablet 0  . promethazine (PHENERGAN) 25 MG tablet TAKE 1 TABLET BY MOUTH EVERY 8 HOURS AS NEEDED FOR NAUSEA/VOMITING 20 tablet 0  . rizatriptan (MAXALT) 10 MG tablet TAKE 1 TABLET BY MOUTH EVERY DAY AS NEEDED FOR MIGRANE 6 tablet 1  . UNITHROID 100 MCG tablet Take 1 tablet (100 mcg total) by mouth daily before breakfast. 90 tablet 3   No current facility-administered medications on file prior to visit.    Allergies  Allergen Reactions  . Erythromycin Nausea And Vomiting   Social History   Socioeconomic History  .  Marital status: Married    Spouse name: Not on file  . Number of children: Not on file  . Years of education: Not on file  . Highest education level: Not on file  Occupational History  . Not on file  Social Needs  . Financial resource strain: Not on file  . Food insecurity:    Worry: Not on file    Inability: Not on file  . Transportation needs:    Medical: Not on file    Non-medical: Not on file  Tobacco Use  . Smoking status: Never Smoker  . Smokeless tobacco: Never Used  . Tobacco comment: never used tobacco  Substance and Sexual Activity  . Alcohol use: Yes    Alcohol/week: 0.0 standard drinks    Comment: social  . Drug use: No  . Sexual activity: Yes    Birth  control/protection: Post-menopausal    Comment: 1st intercourse 53 yo-Fewer than 5 partners  Lifestyle  . Physical activity:    Days per week: Not on file    Minutes per session: Not on file  . Stress: Not on file  Relationships  . Social connections:    Talks on phone: Not on file    Gets together: Not on file    Attends religious service: Not on file    Active member of club or organization: Not on file    Attends meetings of clubs or organizations: Not on file    Relationship status: Not on file  . Intimate partner violence:    Fear of current or ex partner: Not on file    Emotionally abused: Not on file    Physically abused: Not on file    Forced sexual activity: Not on file  Other Topics Concern  . Not on file  Social History Narrative  . Not on file      Review of Systems  All other systems reviewed and are negative.      Objective:   Physical Exam  No physical exam was performed.  Patient provided the description of her ulcers over the telephone      Assessment & Plan:  Viral pharyngitis  Differential diagnosis includes viral pharyngitis/herpangina versus aphthous ulcers.  Recommended symptomatic therapy with ibuprofen 800 mg every 8 hours as needed for pain and swelling, gargle warm salt water 3-4 times a day to help reduce the viral particles in the throat, and she can also use [employer]'s Magic mouthwash which includes cortisone and lidocaine, 1 teaspoon gargle and swallow every 4 hours as needed for pain.  Reassess later this week if no better or sooner if worse.  There is also concerned that her son may have been a virus.  Therefore I recommended that the patient self quarantine at home for the next 14 days and avoid outside contact.  She is to call me immediately if she develops shortness of breath or worsening lower respiratory tract symptoms.  However at the present time her symptoms are mild and do not require hospitalization.

## 2018-06-15 ENCOUNTER — Other Ambulatory Visit: Payer: Self-pay | Admitting: Family Medicine

## 2018-06-25 ENCOUNTER — Other Ambulatory Visit: Payer: Self-pay | Admitting: Endocrinology

## 2018-06-25 ENCOUNTER — Other Ambulatory Visit: Payer: Self-pay

## 2018-06-25 ENCOUNTER — Other Ambulatory Visit (INDEPENDENT_AMBULATORY_CARE_PROVIDER_SITE_OTHER): Payer: BC Managed Care – PPO

## 2018-06-25 DIAGNOSIS — E039 Hypothyroidism, unspecified: Secondary | ICD-10-CM

## 2018-06-25 LAB — T4, FREE: Free T4: 1.19 ng/dL (ref 0.60–1.60)

## 2018-06-25 LAB — TSH: TSH: 0.41 u[IU]/mL (ref 0.35–4.50)

## 2018-06-30 ENCOUNTER — Other Ambulatory Visit: Payer: Self-pay

## 2018-06-30 ENCOUNTER — Ambulatory Visit: Payer: BC Managed Care – PPO | Admitting: Endocrinology

## 2018-06-30 ENCOUNTER — Encounter: Payer: Self-pay | Admitting: Endocrinology

## 2018-06-30 VITALS — BP 102/70 | HR 58 | Temp 98.2°F | Wt 151.8 lb

## 2018-06-30 DIAGNOSIS — E039 Hypothyroidism, unspecified: Secondary | ICD-10-CM

## 2018-06-30 NOTE — Progress Notes (Signed)
Patient ID: Rachel [name], female   DOB: 1965/09/17, 53 y.o.   MRN: [id number]   Reason for Appointment:  Hypothyroidism, followup visit   History of Present Illness:   HYPOTHYROIDISM was first diagnosed in 1992  With a TSH of 51  Her thyroxine dosage has been typically difficult to regulate and requires fairly frequent adjustments of dosage despite her being compliant with brand name Synthroid. 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            TSH was low in the early part of 2017 and her dose has been gradually reduced, now taking 7 tablets per week of 88 g since 11/2015 She does not feel any different with this change   RECENT history:  She has not been seen in follow-up since 12/2016 At that time she had complained of feeling exhausted and drained although she was not sure whether this is related to her work schedule She has a chronic cold intolerance At that time she was also complaining of some hair loss  Since her TSH was 6.1 she was changed to Unithroid 100 mcg daily  However she has not come back for follow-up She thinks she feels fairly good now with no fatigue or feeling of exhaustion She is also not working this year No significant change in her weight even though she thinks she is trying to lose some No hair loss or change in cold intolerance  Her routine with taking Unithroid has been as  prescribed with taking the tablet in the morning before breakfast without any other medication. He had been given Protonix by her PCP previously but has not taken this for a while  She has continued on Synthroid since Synthroid is not covered  TSH has been followed by her oncologist on routine lab work more recently it is still quite normal at 0.4, tends to have some fluctuation   Wt Readings from Last 3 Encounters:  06/30/18 151 lb 12.8 oz (68.9 kg)  04/12/18 149 lb (67.6 kg)  04/08/18 150 lb 8 oz (68.3 kg)    Lab results:  Lab Results  Component Value Date   TSH 0.41 06/25/2018   TSH 2.29 09/23/2017   TSH 0.735 04/03/2017   FREET4 1.19 06/25/2018   FREET4 0.96 06/06/2016   FREET4 1.04 12/05/2015     Allergies as of 06/30/2018      Reactions   Erythromycin Nausea And Vomiting      Medication List       Accurate as of Jun 30, 2018  9:18 AM. If you have any questions, ask your nurse or doctor.        butalbital-acetaminophen-caffeine 50-325-40 MG tablet Commonly known as:  FIORICET TAKE 1 TABLET BY MOUTH EVERY 6 HOURS AS NEEDED FOR HEADACHE   fluticasone 50 MCG/ACT nasal spray Commonly known as:  FLONASE INHALE 2 SPRASY IN EACH NOSTRIL ONCE DAILY   levocetirizine 5 MG tablet Commonly known as:  XYZAL TAKE 1 TABLET BY MOUTH EVERY DAY IN THE EVENING   pantoprazole 40 MG tablet Commonly known as:  PROTONIX TAKE 1 TABLET BY MOUTH EVERY DAY   promethazine 25 MG tablet Commonly known as:  PHENERGAN TAKE 1 TABLET BY MOUTH EVERY 8 HOURS AS NEEDED FOR NAUSEA/VOMITING   rizatriptan 10 MG tablet Commonly known as:  MAXALT TAKE 1 TABLET BY MOUTH EVERY DAY AS NEEDED FOR MIGRANE   Unithroid 100 MCG tablet Generic drug:  levothyroxine Take 1 tablet (100 mcg total) by mouth daily before breakfast.   VITAMIN D PO Take 2,000 Units by mouth.        Past Medical History:  Diagnosis Date  . ASCUS (atypical squamous cells of undetermined significance) on Pap smear  02/2006, 05/2006   NEG HPV ----- PAP 12/2007 WNL  . ASCUS of cervix with negative high risk HPV 03/2018  . 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 Chiari malformation type I ([place])   . Chronic headache   . Family history of uterine cancer   . History of breast cancer 2000  . Hypercholesteremia   . Hypothyroidism   . Osteoporosis 09/2016   T score of -2.5 statistically significant increased right hip, stable left hip and AP spine  . Personal history of chemotherapy 2001  . Personal history of radiation therapy 2001    Past Surgical History:  Procedure Laterality Date  . BREAST EXCISIONAL BIOPSY Left   . BREAST LUMPECTOMY Right 2001  . CESAREAN SECTION    . COLPOSCOPY    . PELVIC LAPAROSCOPY    . TONSILLECTOMY      Family History  Problem Relation Age of Onset  . Diabetes Father   . Uterine cancer Maternal Aunt 60    Social History:  reports that she has never smoked. She has never used smokeless tobacco. She reports current alcohol use. She reports that she does not use drugs.  Allergies:  Allergies  Allergen Reactions  . Erythromycin Nausea And Vomiting    Review of systems:   History of osteopenia treated by other physicians  Has history of hypercholesterolemia   Examination:   BP 102/70 (BP Location: Left Arm, Patient Position: Sitting, Cuff Size: Normal)   Pulse (!) 58   Temp 98.2 F (36.8 C) (Oral)   Wt 151 lb 12.8 oz (68.9 kg)   SpO2 98%   BMI 26.06 kg/m   She looks well   No swelling of the eyes  Thyroid not palpable Biceps reflexes show mild delayed relaxation  Peripheral edema not present  Skin appears normal.     Assessment    Hypothyroidism, long-standing with history of variability in her TSH and has required various doses of the last several years  She is doing fairly well with no change in her dosage since her last visit in 12/2016 She is taking Unithroid 100 mcg daily She may have  mild fluctuation in her TSH but subjectively doing well TSH is 0.4 now but has been below 1.0 also in the last year or so and not consistent   Plan: She will stay on the same dose She will also stay on brand-name Unithroid Discussed that her thyroid is not playing a role in her difficulty with weight loss  Follow-up in 6 months   Destan Franchini 06/30/2018, 9:18 AM

## 2018-07-02 ENCOUNTER — Other Ambulatory Visit: Payer: Self-pay | Admitting: Endocrinology

## 2018-09-11 DIAGNOSIS — M8580 Other specified disorders of bone density and structure, unspecified site: Secondary | ICD-10-CM

## 2018-09-11 HISTORY — DX: Other specified disorders of bone density and structure, unspecified site: M85.80

## 2018-09-15 ENCOUNTER — Other Ambulatory Visit: Payer: Self-pay

## 2018-09-15 ENCOUNTER — Other Ambulatory Visit: Payer: Self-pay | Admitting: Gynecology

## 2018-09-15 ENCOUNTER — Ambulatory Visit (INDEPENDENT_AMBULATORY_CARE_PROVIDER_SITE_OTHER): Payer: BC Managed Care – PPO

## 2018-09-15 DIAGNOSIS — M810 Age-related osteoporosis without current pathological fracture: Secondary | ICD-10-CM

## 2018-09-15 DIAGNOSIS — M8589 Other specified disorders of bone density and structure, multiple sites: Secondary | ICD-10-CM

## 2018-09-15 DIAGNOSIS — Z13820 Encounter for screening for osteoporosis: Secondary | ICD-10-CM | POA: Diagnosis not present

## 2018-09-15 DIAGNOSIS — Z780 Asymptomatic menopausal state: Secondary | ICD-10-CM

## 2018-09-20 ENCOUNTER — Encounter: Payer: Self-pay | Admitting: Gynecology

## 2018-10-07 ENCOUNTER — Encounter: Payer: Self-pay | Admitting: Hematology & Oncology

## 2018-10-07 ENCOUNTER — Inpatient Hospital Stay: Payer: BC Managed Care – PPO | Attending: Hematology & Oncology | Admitting: Hematology & Oncology

## 2018-10-07 ENCOUNTER — Inpatient Hospital Stay: Payer: BC Managed Care – PPO

## 2018-10-07 ENCOUNTER — Other Ambulatory Visit: Payer: Self-pay

## 2018-10-07 VITALS — BP 118/66 | HR 56 | Temp 97.7°F | Resp 16 | Wt 151.0 lb

## 2018-10-07 DIAGNOSIS — R21 Rash and other nonspecific skin eruption: Secondary | ICD-10-CM | POA: Diagnosis not present

## 2018-10-07 DIAGNOSIS — E032 Hypothyroidism due to medicaments and other exogenous substances: Secondary | ICD-10-CM | POA: Insufficient documentation

## 2018-10-07 DIAGNOSIS — E7800 Pure hypercholesterolemia, unspecified: Secondary | ICD-10-CM | POA: Diagnosis not present

## 2018-10-07 DIAGNOSIS — Z853 Personal history of malignant neoplasm of breast: Secondary | ICD-10-CM | POA: Diagnosis present

## 2018-10-07 DIAGNOSIS — Z79899 Other long term (current) drug therapy: Secondary | ICD-10-CM | POA: Diagnosis not present

## 2018-10-07 DIAGNOSIS — M8580 Other specified disorders of bone density and structure, unspecified site: Secondary | ICD-10-CM | POA: Diagnosis not present

## 2018-10-07 DIAGNOSIS — C50611 Malignant neoplasm of axillary tail of right female breast: Secondary | ICD-10-CM

## 2018-10-07 DIAGNOSIS — E039 Hypothyroidism, unspecified: Secondary | ICD-10-CM

## 2018-10-07 DIAGNOSIS — Z171 Estrogen receptor negative status [ER-]: Secondary | ICD-10-CM

## 2018-10-07 LAB — CBC WITH DIFFERENTIAL (CANCER CENTER ONLY)
Abs Immature Granulocytes: 0.01 10*3/uL (ref 0.00–0.07)
Basophils Absolute: 0.1 10*3/uL (ref 0.0–0.1)
Basophils Relative: 1 %
Eosinophils Absolute: 0.2 10*3/uL (ref 0.0–0.5)
Eosinophils Relative: 3 %
HCT: 43.1 % (ref 36.0–46.0)
Hemoglobin: 14 g/dL (ref 12.0–15.0)
Immature Granulocytes: 0 %
Lymphocytes Relative: 45 %
Lymphs Abs: 2.6 10*3/uL (ref 0.7–4.0)
MCH: 30.2 pg (ref 26.0–34.0)
MCHC: 32.5 g/dL (ref 30.0–36.0)
MCV: 92.9 fL (ref 80.0–100.0)
Monocytes Absolute: 0.4 10*3/uL (ref 0.1–1.0)
Monocytes Relative: 6 %
Neutro Abs: 2.7 10*3/uL (ref 1.7–7.7)
Neutrophils Relative %: 45 %
Platelet Count: 237 10*3/uL (ref 150–400)
RBC: 4.64 MIL/uL (ref 3.87–5.11)
RDW: 12.6 % (ref 11.5–15.5)
WBC Count: 5.9 10*3/uL (ref 4.0–10.5)
nRBC: 0 % (ref 0.0–0.2)

## 2018-10-07 LAB — CMP (CANCER CENTER ONLY)
ALT: 19 U/L (ref 0–44)
AST: 17 U/L (ref 15–41)
Albumin: 4.4 g/dL (ref 3.5–5.0)
Alkaline Phosphatase: 89 U/L (ref 38–126)
Anion gap: 8 (ref 5–15)
BUN: 17 mg/dL (ref 6–20)
CO2: 28 mmol/L (ref 22–32)
Calcium: 8.8 mg/dL — ABNORMAL LOW (ref 8.9–10.3)
Chloride: 106 mmol/L (ref 98–111)
Creatinine: 0.77 mg/dL (ref 0.44–1.00)
GFR, Est AFR Am: >60 mL/min (ref >60)
GFR, Estimated: >60 mL/min (ref >60)
Glucose, Bld: 95 mg/dL (ref 70–99)
Potassium: 3.9 mmol/L (ref 3.5–5.1)
Sodium: 142 mmol/L (ref 135–145)
Total Bilirubin: 0.6 mg/dL (ref 0.3–1.2)
Total Protein: 6.7 g/dL (ref 6.5–8.1)

## 2018-10-07 LAB — LIPID PANEL
Cholesterol: 259 mg/dL — ABNORMAL HIGH (ref 0–200)
HDL: 52 mg/dL (ref >40)
LDL Cholesterol: 183 mg/dL — ABNORMAL HIGH (ref 0–99)
Total CHOL/HDL Ratio: 5 RATIO
Triglycerides: 119 mg/dL (ref <150)
VLDL: 24 mg/dL (ref 0–40)

## 2018-10-07 LAB — TSH: TSH: 1.249 u[IU]/mL (ref 0.308–3.960)

## 2018-10-07 MED ORDER — DOXYCYCLINE HYCLATE 100 MG PO TABS: 100.0000 mg | ORAL_TABLET | Freq: Two times a day (BID) | ORAL | 0 refills | Status: DC

## 2018-10-07 NOTE — Progress Notes (Signed)
Hematology and Oncology Follow Up Visit  Rachel [name] [facility] [id number] Jul 10, 1965 53 y.o. 10/07/2018   Principle Diagnosis:  Stage I (T1 N0M0) infiltrating ductal carcinoma the right breast  Current Therapy:    Observation     Interim History:  Ms.  Rachel [name] is back for followup.  She is now retired.  However, she decided to go back to work.  She now works at a private school.  She enjoys this.  It is quite difficult however because of the coronavirus.  She and her husband Rachel [name] property up in [place].  They will build a house of their.  I am happy about this for them.  Her health is doing well.  She does not want take anything for her high cholesterol.  We are checking this today.  She has had no issues with nausea or vomiting.  She has had no problems with fever.  She does have a little bit of a rash on her upper anterior chest wall.  This does not appear to be shingles.  We will trial doxycycline for this to see if this may help.  She has had no problems with bowels or bladder.  She has had no leg swelling.  She has had no cough.  Overall, I would say performance status is ECOG 0.     Medications:  Current Outpatient Medications:  .  butalbital-acetaminophen-caffeine (FIORICET, ESGIC) 50-325-40 MG per tablet, TAKE 1 TABLET BY MOUTH EVERY 6 HOURS AS NEEDED FOR HEADACHE, Disp: 20 tablet, Rfl: 0 .  Cholecalciferol (VITAMIN D PO), Take 2,000 Units by mouth., Disp: , Rfl:  .  fluticasone (FLONASE) 50 MCG/ACT nasal spray, INHALE 2 SPRASY IN EACH NOSTRIL ONCE DAILY, Disp: 16 g, Rfl: 11 .  levocetirizine (XYZAL) 5 MG tablet, TAKE 1 TABLET BY MOUTH EVERY DAY IN THE EVENING, Disp: 90 tablet, Rfl: 3 .  promethazine (PHENERGAN) 25 MG tablet, TAKE 1 TABLET BY MOUTH EVERY 8 HOURS AS NEEDED FOR NAUSEA/VOMITING, Disp: 20 tablet, Rfl: 0 .  rizatriptan (MAXALT) 10 MG tablet, TAKE 1 TABLET BY MOUTH EVERY DAY AS NEEDED FOR MIGRANE, Disp: 6 tablet, Rfl: 1 .  UNITHROID 100 MCG tablet, TAKE 1 TABLET BY  MOUTH EVERY MORNING BEFORE BREAKFAST, Disp: 90 tablet, Rfl: 3  Allergies:  Allergies  Allergen Reactions  . Erythromycin Nausea And Vomiting    Past Medical History, Surgical history, Social history, and Family History were reviewed and updated.  Review of Systems: Review of Systems  Constitutional: Negative.   HENT: Positive for congestion.   Eyes: Negative.   Respiratory: Negative.   Cardiovascular: Negative.   Gastrointestinal: Negative.   Genitourinary: Negative.   Musculoskeletal: Negative.   Skin: Negative.   Neurological: Negative.   Endo/Heme/Allergies: Negative.   Psychiatric/Behavioral: Negative.      Physical Exam:  weight is 151 lb (68.5 kg). Her oral temperature is 97.7 F (36.5 C). Her blood pressure is 118/66 and her pulse is 56 (abnormal). Her respiration is 16 and oxygen saturation is 100%.   Well-developed and well-nourished white female. Head and neck exam shows no ocular or oral lesions. She has no palpable cervical or supraclavicular lymph nodes. Lungs are clear bilaterally. Cardiac exam regular rate and rhythm with no murmurs rubs or bruits. Abdomen is soft. Has good bowel sounds. There is no fluid wave. There is no palpable liver or spleen tip. Breast exam shows left breast no masses edema or erythema. There is no left axillary adenopathy. Right breast is slightly contracted from surgery  and radiation. She has a well-healed lumpectomy at the 10:00 position. No distinct mass is noted in the right breast. There is no right axillary adenopathy. Back exam shows no tenderness over the spine ribs or hips. Extremities shows no clubbing cyanosis or edema. Skin exam no rashes, ecchymosis or petechia. Neurological exam is nonfocal.  Lab Results  Component Value Date   WBC 5.9 10/07/2018   HGB 14.0 10/07/2018   HCT 43.1 10/07/2018   MCV 92.9 10/07/2018   PLT 237 10/07/2018     Chemistry      Component Value Date/Time   NA 142 10/07/2018 0916   NA 140  09/26/2016 0743   NA 143 09/21/2015 0854   K 3.9 10/07/2018 0916   K 3.8 09/26/2016 0743   K 4.4 09/21/2015 0854   CL 106 10/07/2018 0916   CL 104 09/26/2016 0743   CO2 28 10/07/2018 0916   CO2 30 09/26/2016 0743   CO2 28 09/21/2015 0854   BUN 17 10/07/2018 0916   BUN 14 09/26/2016 0743   BUN 13.9 09/21/2015 0854   CREATININE 0.77 10/07/2018 0916   CREATININE 0.87 01/01/2018 1055   CREATININE 0.8 09/21/2015 0854      Component Value Date/Time   CALCIUM 8.8 (L) 10/07/2018 0916   CALCIUM 9.4 09/26/2016 0743   CALCIUM 10.1 09/21/2015 0854   ALKPHOS 89 10/07/2018 0916   ALKPHOS 80 09/26/2016 0743   ALKPHOS 98 09/21/2015 0854   AST 17 10/07/2018 0916   AST 37 (H) 09/21/2015 0854   ALT 19 10/07/2018 0916   ALT 35 09/26/2016 0743   ALT 57 (H) 09/21/2015 0854   BILITOT 0.6 10/07/2018 0916   BILITOT 0.53 09/21/2015 0854        Impression and Plan: Rachel [name] is a 53 year old white female with a history of stage I ductal carcinoma of the right breast. She was diagnosed 16 years ago. 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 I  think that  she is cured.  I will go ahead and plan to get her back in 6 more months.  I think this is reasonable amount of time.  She likes to come back to see [place] every 6 months.  We actually are the ones to check her cholesterol and thyroid.  She will continue to think about genetic testing.  She has 2 boys.  She has a grandson.  Rachel Napoleon, MD 8/27/202010:38 AM

## 2018-10-08 ENCOUNTER — Telehealth: Payer: Self-pay | Admitting: *Deleted

## 2018-10-08 LAB — VITAMIN D 25 HYDROXY (VIT D DEFICIENCY, FRACTURES): Vit D, 25-Hydroxy: 59.5 ng/mL (ref 30.0–100.0)

## 2018-10-08 NOTE — Telephone Encounter (Signed)
-----   Message from Volanda Napoleon, MD sent at 10/07/2018  4:53 PM EDT ----- Call - your thyroid is great!!  Your cholesterol is awful!!!  It is higher.  The cholesterol/HDL ratio is higher which mean you have a higher risk of heart disease!!    Please send this to her family MD  Laurey Arrow

## 2018-10-11 ENCOUNTER — Other Ambulatory Visit: Payer: Self-pay | Admitting: Family Medicine

## 2018-10-12 ENCOUNTER — Telehealth: Payer: Self-pay | Admitting: *Deleted

## 2018-10-12 NOTE — Telephone Encounter (Signed)
As noted below by Dr. Marin Olp, I informed the patient that her thyroid is good. All the labs have been sent to your PCP. She verbalized understanding.

## 2018-10-12 NOTE — Telephone Encounter (Signed)
-----   Message from Volanda Napoleon, MD sent at 10/07/2018  4:53 PM EDT ----- Call - your thyroid is great!!  Your cholesterol is awful!!!  It is higher.  The cholesterol/HDL ratio is higher which mean you have a higher risk of heart disease!!    Please send this to her family MD  Laurey Arrow

## 2018-11-03 ENCOUNTER — Encounter: Payer: Self-pay | Admitting: Gynecology

## 2018-11-25 ENCOUNTER — Other Ambulatory Visit: Payer: Self-pay | Admitting: Gynecology

## 2018-11-25 DIAGNOSIS — Z1231 Encounter for screening mammogram for malignant neoplasm of breast: Secondary | ICD-10-CM

## 2019-01-31 ENCOUNTER — Ambulatory Visit
Admission: RE | Admit: 2019-01-31 | Discharge: 2019-01-31 | Disposition: A | Discharge status: Home / Self Care | Payer: BC Managed Care – PPO | Source: Ambulatory Visit | Attending: Gynecology | Admitting: Gynecology

## 2019-01-31 ENCOUNTER — Other Ambulatory Visit: Payer: Self-pay

## 2019-01-31 DIAGNOSIS — Z1231 Encounter for screening mammogram for malignant neoplasm of breast: Secondary | ICD-10-CM

## 2019-02-01 ENCOUNTER — Other Ambulatory Visit: Payer: Self-pay | Admitting: Gynecology

## 2019-02-01 DIAGNOSIS — R928 Other abnormal and inconclusive findings on diagnostic imaging of breast: Secondary | ICD-10-CM

## 2019-02-09 ENCOUNTER — Other Ambulatory Visit: Payer: Self-pay

## 2019-02-09 ENCOUNTER — Other Ambulatory Visit: Payer: Self-pay | Admitting: Gynecology

## 2019-02-09 ENCOUNTER — Ambulatory Visit
Admission: RE | Admit: 2019-02-09 | Discharge: 2019-02-09 | Disposition: A | Discharge status: Home / Self Care | Payer: BC Managed Care – PPO | Source: Ambulatory Visit | Attending: Gynecology | Admitting: Gynecology

## 2019-02-09 DIAGNOSIS — N6320 Unspecified lump in the left breast, unspecified quadrant: Secondary | ICD-10-CM

## 2019-02-09 DIAGNOSIS — R928 Other abnormal and inconclusive findings on diagnostic imaging of breast: Secondary | ICD-10-CM

## 2019-02-11 HISTORY — PX: BREAST EXCISIONAL BIOPSY: SUR124

## 2019-02-18 ENCOUNTER — Other Ambulatory Visit: Payer: Self-pay | Admitting: Obstetrics & Gynecology

## 2019-02-18 ENCOUNTER — Ambulatory Visit
Admission: RE | Admit: 2019-02-18 | Discharge: 2019-02-18 | Disposition: A | Discharge status: Home / Self Care | Payer: BC Managed Care – PPO | Source: Ambulatory Visit | Attending: Obstetrics & Gynecology | Admitting: Obstetrics & Gynecology

## 2019-02-18 ENCOUNTER — Other Ambulatory Visit: Payer: Self-pay | Admitting: Gynecology

## 2019-02-18 ENCOUNTER — Other Ambulatory Visit: Payer: Self-pay

## 2019-02-18 ENCOUNTER — Ambulatory Visit
Admission: RE | Admit: 2019-02-18 | Discharge: 2019-02-18 | Disposition: A | Discharge status: Home / Self Care | Payer: BC Managed Care – PPO | Source: Ambulatory Visit | Attending: Gynecology | Admitting: Gynecology

## 2019-02-18 DIAGNOSIS — R928 Other abnormal and inconclusive findings on diagnostic imaging of breast: Secondary | ICD-10-CM

## 2019-02-18 DIAGNOSIS — N6320 Unspecified lump in the left breast, unspecified quadrant: Secondary | ICD-10-CM

## 2019-04-07 ENCOUNTER — Encounter: Payer: Self-pay | Admitting: Hematology & Oncology

## 2019-04-07 ENCOUNTER — Inpatient Hospital Stay: Payer: BC Managed Care – PPO

## 2019-04-07 ENCOUNTER — Other Ambulatory Visit: Payer: Self-pay

## 2019-04-07 ENCOUNTER — Inpatient Hospital Stay: Payer: BC Managed Care – PPO | Attending: Hematology & Oncology | Admitting: Hematology & Oncology

## 2019-04-07 VITALS — BP 138/68 | HR 63 | Temp 97.3°F | Resp 16 | Wt 156.0 lb

## 2019-04-07 DIAGNOSIS — Z79899 Other long term (current) drug therapy: Secondary | ICD-10-CM | POA: Insufficient documentation

## 2019-04-07 DIAGNOSIS — E785 Hyperlipidemia, unspecified: Secondary | ICD-10-CM | POA: Insufficient documentation

## 2019-04-07 DIAGNOSIS — Z171 Estrogen receptor negative status [ER-]: Secondary | ICD-10-CM

## 2019-04-07 DIAGNOSIS — C50611 Malignant neoplasm of axillary tail of right female breast: Secondary | ICD-10-CM

## 2019-04-07 DIAGNOSIS — M8580 Other specified disorders of bone density and structure, unspecified site: Secondary | ICD-10-CM

## 2019-04-07 DIAGNOSIS — Z853 Personal history of malignant neoplasm of breast: Secondary | ICD-10-CM | POA: Insufficient documentation

## 2019-04-07 DIAGNOSIS — E032 Hypothyroidism due to medicaments and other exogenous substances: Secondary | ICD-10-CM

## 2019-04-07 LAB — CMP (CANCER CENTER ONLY)
ALT: 21 U/L (ref 0–44)
AST: 18 U/L (ref 15–41)
Albumin: 4.6 g/dL (ref 3.5–5.0)
Alkaline Phosphatase: 84 U/L (ref 38–126)
Anion gap: 7 (ref 5–15)
BUN: 17 mg/dL (ref 6–20)
CO2: 30 mmol/L (ref 22–32)
Calcium: 10.1 mg/dL (ref 8.9–10.3)
Chloride: 107 mmol/L (ref 98–111)
Creatinine: 0.86 mg/dL (ref 0.44–1.00)
GFR, Est AFR Am: >60 mL/min (ref >60)
GFR, Estimated: >60 mL/min (ref >60)
Glucose, Bld: 103 mg/dL — ABNORMAL HIGH (ref 70–99)
Potassium: 3.9 mmol/L (ref 3.5–5.1)
Sodium: 144 mmol/L (ref 135–145)
Total Bilirubin: 0.5 mg/dL (ref 0.3–1.2)
Total Protein: 7.1 g/dL (ref 6.5–8.1)

## 2019-04-07 LAB — CBC WITH DIFFERENTIAL (CANCER CENTER ONLY)
Abs Immature Granulocytes: 0 10*3/uL (ref 0.00–0.07)
Basophils Absolute: 0.1 10*3/uL (ref 0.0–0.1)
Basophils Relative: 1 %
Eosinophils Absolute: 0.1 10*3/uL (ref 0.0–0.5)
Eosinophils Relative: 2 %
HCT: 43.7 % (ref 36.0–46.0)
Hemoglobin: 14.3 g/dL (ref 12.0–15.0)
Immature Granulocytes: 0 %
Lymphocytes Relative: 47 %
Lymphs Abs: 2.6 10*3/uL (ref 0.7–4.0)
MCH: 29.8 pg (ref 26.0–34.0)
MCHC: 32.7 g/dL (ref 30.0–36.0)
MCV: 91 fL (ref 80.0–100.0)
Monocytes Absolute: 0.3 10*3/uL (ref 0.1–1.0)
Monocytes Relative: 6 %
Neutro Abs: 2.4 10*3/uL (ref 1.7–7.7)
Neutrophils Relative %: 44 %
Platelet Count: 244 10*3/uL (ref 150–400)
RBC: 4.8 MIL/uL (ref 3.87–5.11)
RDW: 12.5 % (ref 11.5–15.5)
WBC Count: 5.5 10*3/uL (ref 4.0–10.5)
nRBC: 0 % (ref 0.0–0.2)

## 2019-04-07 LAB — LIPID PANEL
Cholesterol: 302 mg/dL — ABNORMAL HIGH (ref 0–200)
HDL: 51 mg/dL (ref >40)
LDL Cholesterol: 223 mg/dL — ABNORMAL HIGH (ref 0–99)
Total CHOL/HDL Ratio: 5.9 RATIO
Triglycerides: 139 mg/dL (ref <150)
VLDL: 28 mg/dL (ref 0–40)

## 2019-04-07 LAB — VITAMIN D 25 HYDROXY (VIT D DEFICIENCY, FRACTURES): Vit D, 25-Hydroxy: 48.34 ng/mL (ref 30–100)

## 2019-04-07 LAB — TSH: TSH: 2.869 u[IU]/mL (ref 0.308–3.960)

## 2019-04-07 NOTE — Progress Notes (Signed)
Hematology and Oncology Follow Up Visit  Rachel [name] [facility] [id number] 01-Dec-1965 54 y.o. 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 is back for followup.  She has been quite busy since we last saw her.  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  She really is enjoying this.  The bad news is that she and her husband have not yet felt their house up in [facility].  There are cost issues that need to be resolved.  Medically, she had an abnormal mammogram back in December.  This was in the left breast.  She then underwent an ultrasound.  This was done on 02/09/2019.  This showed a 3 x 2 x 2 mm hypoechoic mass at the 3 o'clock position.  Given her past history, this was biopsied.  The biopsy was done on 02/18/2019.  The pathology report ([id number]) shows fibrocystic change with apocrine metaplasia.  There is usual duct epithelial hyperplasia.  This, in my opinion, is a marker for the possibility of more aggressive disease.  I talked her about this.  I thought that it would not be a bad idea to get her on Femara.  I think this would be a good preventive agent to try to minimize the risk of her developing DCIS or an invasive cancer.  She would like to think about this.  Otherwise, there really is no other issues.  She will not take cholesterol medication.  We checked her cholesterol today.  It was over 300.  Her cholesterol/HDL ratio was 5.9.  She is expecting a second grandchild.  This will be a granddaughter.  She will be born in late March.  She already has a grandson which she really enjoys.  I am happy that she is teaching.  She is a born Tourist information centre manager.  She really enjoys teaching.  Otherwise, she really has no other issues.  Her performance status is ECOG 0   Medications:  Current Outpatient Medications:  .   butalbital-acetaminophen-caffeine (FIORICET, ESGIC) 50-325-40 MG per tablet, TAKE 1 TABLET BY MOUTH EVERY 6 HOURS AS NEEDED FOR HEADACHE, Disp: 20 tablet, Rfl: 0 .  Cholecalciferol (VITAMIN D PO), Take 2,000 Units by mouth., Disp: , Rfl:  .  doxycycline (VIBRA-TABS) 100 MG tablet, Take 1 tablet (100 mg total) by mouth 2 (two) times daily., Disp: 20 tablet, Rfl: 0 .  fluticasone (FLONASE) 50 MCG/ACT nasal spray, INHALE 2 SPRASY IN EACH NOSTRIL ONCE DAILY, Disp: 16 g, Rfl: 11 .  levocetirizine (XYZAL) 5 MG tablet, TAKE 1 TABLET BY MOUTH EVERY DAY IN THE EVENING, Disp: 90 tablet, Rfl: 3 .  promethazine (PHENERGAN) 25 MG tablet, TAKE 1 TABLET BY MOUTH EVERY 8 HOURS AS NEEDED FOR NAUSEA/VOMITING, Disp: 20 tablet, Rfl: 0 .  rizatriptan (MAXALT) 10 MG tablet, TAKE 1 TABLET BY MOUTH EVERY DAY AS NEEDED FOR MIGRANE, Disp: 6 tablet, Rfl: 5 .  UNITHROID 100 MCG tablet, TAKE 1 TABLET BY MOUTH EVERY MORNING BEFORE BREAKFAST, Disp: 90 tablet, Rfl: 3  Allergies:  Allergies  Allergen Reactions  . Erythromycin Nausea And Vomiting    Past Medical History, Surgical history, Social history, and Family History were reviewed and updated.  Review of Systems: Review of Systems  Constitutional: Negative.   HENT: Positive for congestion.   Eyes: Negative.   Respiratory:  Negative.   Cardiovascular: Negative.   Gastrointestinal: Negative.   Genitourinary: Negative.   Musculoskeletal: Negative.   Skin: Negative.   Neurological: Negative.   Endo/Heme/Allergies: Negative.   Psychiatric/Behavioral: Negative.      Physical Exam:  weight is 156 lb (70.8 kg). Her temporal temperature is 97.3 F (36.3 C) (abnormal). Her blood pressure is 138/68 and her pulse is 63. Her respiration is 16 and oxygen saturation is 100%.   Physical Exam Vitals reviewed.  Constitutional:      Comments: Her breast exam shows right breast with no masses, edema or erythema.  She has a well-healed lumpectomy at the 10 o'clock position  on the right breast.  No erythema or masses are noted.  There is no right axillary adenopathy.  Left breast shows no masses, edema or erythema.  The biopsy site is well-healed.  There is no left axillary adenopathy.  HENT:     Head: Normocephalic and atraumatic.  Eyes:     Pupils: Pupils are equal, round, and reactive to light.  Cardiovascular:     Rate and Rhythm: Normal rate and regular rhythm.     Heart sounds: Normal heart sounds.  Pulmonary:     Effort: Pulmonary effort is normal.     Breath sounds: Normal breath sounds.  Abdominal:     General: Bowel sounds are normal.     Palpations: Abdomen is soft.  Musculoskeletal:        General: No tenderness or deformity. Normal range of motion.     Cervical back: Normal range of motion.  Lymphadenopathy:     Cervical: No cervical adenopathy.  Skin:    General: Skin is warm and dry.     Findings: No erythema or rash.  Neurological:     Mental Status: She is alert and oriented to person, place, and time.  Psychiatric:        Behavior: Behavior normal.        Thought Content: Thought content normal.        Judgment: Judgment normal.      Lab Results  Component Value Date   WBC 5.5 04/07/2019   HGB 14.3 04/07/2019   HCT 43.7 04/07/2019   MCV 91.0 04/07/2019   PLT 244 04/07/2019     Chemistry      Component Value Date/Time   NA 144 04/07/2019 0915   NA 140 09/26/2016 0743   NA 143 09/21/2015 0854   K 3.9 04/07/2019 0915   K 3.8 09/26/2016 0743   K 4.4 09/21/2015 0854   CL 107 04/07/2019 0915   CL 104 09/26/2016 0743   CO2 30 04/07/2019 0915   CO2 30 09/26/2016 0743   CO2 28 09/21/2015 0854   BUN 17 04/07/2019 0915   BUN 14 09/26/2016 0743   BUN 13.9 09/21/2015 0854   CREATININE 0.86 04/07/2019 0915   CREATININE 0.87 01/01/2018 1055   CREATININE 0.8 09/21/2015 0854      Component Value Date/Time   CALCIUM 10.1 04/07/2019 0915   CALCIUM 9.4 09/26/2016 0743   CALCIUM 10.1 09/21/2015 0854   ALKPHOS 84 04/07/2019  0915   ALKPHOS 80 09/26/2016 0743   ALKPHOS 98 09/21/2015 0854   AST 18 04/07/2019 0915   AST 37 (H) 09/21/2015 0854   ALT 21 04/07/2019 0915   ALT 35 09/26/2016 0743   ALT 57 (H) 09/21/2015 0854   BILITOT 0.5 04/07/2019 0915   BILITOT 0.53 09/21/2015 0854        Impression and Plan:  Ms. Fong is a 54 year old white female with a history of stage I ductal carcinoma of the right breast. She was diagnosed 17 years ago. 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 I  think that  she is cured.  I do worry a little bit about this pathology that she had with the left breast.  I think this is a "marker" for the possibility of developing a malignant breast tumor.  I know there is controversy as to how to approach this and treat this.  I do think that with her past history, it would not be a bad idea to see about Femara.  I think Femara might be a good agent to try to prevent development of a more aggressive histology.  She like to think about this.  I think this is okay.  I know this is a "gray area" and just want to make sure that we are vigilant.  I will plan to see her back in about 6 months.  I do worry more about her having cardiac issues with her cholesterol.  She just is not going to take statin agents.  We will have to let her family doctor manage the hyperlipidemia.  Thankfully, her vitamin D level is okay as well as her TSH.Marland Kitchen  Volanda Napoleon, MD 2/25/20214:28 PM

## 2019-04-08 ENCOUNTER — Telehealth: Payer: Self-pay | Admitting: *Deleted

## 2019-04-08 ENCOUNTER — Telehealth: Payer: Self-pay | Admitting: Hematology & Oncology

## 2019-04-08 NOTE — Telephone Encounter (Signed)
Message left to instruct pt to call office back for lab results.

## 2019-04-09 ENCOUNTER — Ambulatory Visit: Payer: BC Managed Care – PPO | Attending: Internal Medicine

## 2019-04-09 DIAGNOSIS — Z23 Encounter for immunization: Secondary | ICD-10-CM

## 2019-04-09 NOTE — Progress Notes (Signed)
   Covid-19 Vaccination Clinic  Name:  Rachel [name]    MRN: [id number] DOB: 10-07-1965  04/09/2019  [name] was observed post Covid-19 immunization for 15 minutes without incidence. She was provided with Vaccine Information Sheet and instruction to access the V-Safe system.   [name] was instructed to call 911 with any severe reactions post vaccine: Marland Kitchen Difficulty breathing  . Swelling of your face and throat  . A fast heartbeat  . A bad rash all over your body  . Dizziness and weakness    Immunizations Administered    Name Date Dose VIS Date Route   Pfizer COVID-19 Vaccine 04/09/2019 10:07 AM 0.3 mL 01/21/2019 Intramuscular   Manufacturer: [place]   Lot: [id number]   [place]: [id number]

## 2019-04-11 ENCOUNTER — Encounter: Payer: Self-pay | Admitting: *Deleted

## 2019-04-13 ENCOUNTER — Encounter: Payer: BC Managed Care – PPO | Admitting: Gynecology

## 2019-04-13 ENCOUNTER — Encounter: Payer: BC Managed Care – PPO | Admitting: Obstetrics and Gynecology

## 2019-04-30 ENCOUNTER — Ambulatory Visit: Payer: BC Managed Care – PPO | Attending: Internal Medicine

## 2019-04-30 DIAGNOSIS — Z23 Encounter for immunization: Secondary | ICD-10-CM

## 2019-04-30 NOTE — Progress Notes (Signed)
   Covid-19 Vaccination Clinic  Name:  Rachel [name]    MRN: [id number] DOB: [birth year]  04/30/2019  [name] was observed post Covid-19 immunization for 15 minutes without incident. She was provided with Vaccine Information Sheet and instruction to access the V-Safe system.   [name] was instructed to call 911 with any severe reactions post vaccine: Marland Kitchen Difficulty breathing  . Swelling of face and throat  . A fast heartbeat  . A bad rash all over body  . Dizziness and weakness   Immunizations Administered    Name Date Dose VIS Date Route   Pfizer COVID-19 Vaccine 04/30/2019  9:46 AM 0.3 mL 01/21/2019 Intramuscular   Manufacturer: [place]   Lot: [id number]   [place]: [id number]

## 2019-05-04 ENCOUNTER — Ambulatory Visit: Payer: BC Managed Care – PPO

## 2019-05-09 ENCOUNTER — Other Ambulatory Visit: Payer: Self-pay

## 2019-05-10 ENCOUNTER — Encounter: Payer: Self-pay | Admitting: Obstetrics and Gynecology

## 2019-05-10 ENCOUNTER — Ambulatory Visit: Payer: BC Managed Care – PPO | Admitting: Obstetrics and Gynecology

## 2019-05-10 VITALS — BP 120/82 | Ht 64.0 in | Wt 156.0 lb

## 2019-05-10 DIAGNOSIS — Z853 Personal history of malignant neoplasm of breast: Secondary | ICD-10-CM | POA: Diagnosis not present

## 2019-05-10 DIAGNOSIS — Z01419 Encounter for gynecological examination (general) (routine) without abnormal findings: Secondary | ICD-10-CM | POA: Diagnosis not present

## 2019-05-10 DIAGNOSIS — R87610 Atypical squamous cells of undetermined significance on cytologic smear of cervix (ASC-US): Secondary | ICD-10-CM

## 2019-05-10 NOTE — Progress Notes (Signed)
Charlynn Beland [facility] [birth year] [id number]  SUBJECTIVE:  54 y.o. [id number] female for annual routine gynecologic exam and Pap smear. She has no gynecologic concerns.  Did have a suspicious breast mass detected on her 01/2019 diagnostic mammogram followed by biopsy which indicated fibrocystic change with apocrine metaplasia and usual duct epithelial hyperplasia.  Current Outpatient Medications  Medication Sig Dispense Refill  . butalbital-acetaminophen-caffeine (FIORICET, ESGIC) 50-325-40 MG per tablet TAKE 1 TABLET BY MOUTH EVERY 6 HOURS AS NEEDED FOR HEADACHE 20 tablet 0  . Cholecalciferol (VITAMIN D PO) Take 2,000 Units by mouth.    . fluticasone (FLONASE) 50 MCG/ACT nasal spray INHALE 2 SPRASY IN EACH NOSTRIL ONCE DAILY 16 g 11  . levocetirizine (XYZAL) 5 MG tablet TAKE 1 TABLET BY MOUTH EVERY DAY IN THE EVENING 90 tablet 3  . promethazine (PHENERGAN) 25 MG tablet TAKE 1 TABLET BY MOUTH EVERY 8 HOURS AS NEEDED FOR NAUSEA/VOMITING 20 tablet 0  . rizatriptan (MAXALT) 10 MG tablet TAKE 1 TABLET BY MOUTH EVERY DAY AS NEEDED FOR MIGRANE 6 tablet 5  . UNITHROID 100 MCG tablet TAKE 1 TABLET BY MOUTH EVERY MORNING BEFORE BREAKFAST 90 tablet 3   No current facility-administered medications for this visit.   Allergies: Erythromycin  No LMP recorded. (Menstrual status: Chemotherapy).  Past medical history,surgical history, problem list, medications, allergies, family history and social history were all reviewed and documented as reviewed in the EPIC chart.  ROS:  Feeling well. No dyspnea or chest pain on exertion.  No abdominal pain, change in bowel habits, black or bloody stools.  No urinary tract symptoms. GYN ROS: no abnormal bleeding, pelvic pain or discharge, no breast pain or new or enlarging lumps on self exam. No neurological complaints.   OBJECTIVE:  BP 120/82 (BP Location: Left Arm, Patient Position: Sitting, Cuff Size: Normal)   Ht 5\' 4"  (1.626 m)   Wt 156 lb (70.8 kg)   BMI 26.78 kg/m    The patient appears well, alert, oriented x 3, in no distress. ENT normal.  Neck supple. No cervical or supraclavicular adenopathy or thyromegaly.  Lungs are clear, good air entry, no wheezes, rhonchi or rales. S1 and S2 normal, no murmurs, regular rate and rhythm.  Abdomen soft without tenderness, guarding, mass or organomegaly.  Neurological is normal, no focal findings.  BREAST EXAM: breasts appear normal, no suspicious masses, no skin or nipple changes or axillary nodes, well healed right lumpectomy scar  PELVIC EXAM: VULVA: normal appearing vulva with no masses, tenderness or lesions, VAGINA: normal appearing vagina with normal color and discharge, no lesions, CERVIX: normal appearing cervix without discharge or lesions, UTERUS: uterus is normal size, shape, consistency and nontender, ADNEXA: normal adnexa in size, nontender and no masses, PAP: Pap smear done today, thin-prep method  Chaperone: Wandra Scot Bonham present during the examination  ASSESSMENT:  54 y.o. [id number] here for annual gynecologic exam  PLAN:   1. Postmenopausal. No hormonal, vasomotor, or abnormal bleeding concerns. 2. Pap smear/HPV 2020 indicated ASCUS/-HPV.  Pap smear is repeated this year per protocol. 3. History of right breast cancer in 2001.  Mammogram 01/2019 with findings of a left breast mass with diagnostic work-up as noted above returning benign.  Normal breast exam today/NED.  Continue to follow with her oncologist and recommended interval for mammography.  It appears that genetic testing been discussed with her with the genetic counselor previously and she ultimately declined. 4. Colonoscopy 2020.  Recommended that she follow up at the recommended interval.  5.  Osteoporosis.  DEXA 09/2018.  T score -2.4 at the [facility] spine.  She has decided to go without treatment at this time.  Had been on Fosamax in the more remote past.  Supplementing with calcium and vitamin D and remaining physically active.  Next DEXA  recommended 2022. 6. Health maintenance.  No labs today as she normally has these completed elsewhere.  Return annually or sooner, prn.  Joseph Pierini MD, FACOG  05/10/19

## 2019-05-13 LAB — PAP, TP IMAGING W/ HPV RNA, RFLX HPV TYPE 16,18/45: HPV DNA High Risk: NOT DETECTED

## 2019-06-24 DIAGNOSIS — M25522 Pain in left elbow: Secondary | ICD-10-CM | POA: Insufficient documentation

## 2019-06-24 DIAGNOSIS — M25512 Pain in left shoulder: Secondary | ICD-10-CM | POA: Insufficient documentation

## 2019-06-24 DIAGNOSIS — M25532 Pain in left wrist: Secondary | ICD-10-CM | POA: Insufficient documentation

## 2019-06-27 ENCOUNTER — Other Ambulatory Visit: Payer: BC Managed Care – PPO

## 2019-06-30 ENCOUNTER — Ambulatory Visit: Payer: BC Managed Care – PPO | Admitting: Endocrinology

## 2019-07-01 ENCOUNTER — Other Ambulatory Visit: Payer: Self-pay

## 2019-07-01 ENCOUNTER — Other Ambulatory Visit: Payer: Self-pay | Admitting: Internal Medicine

## 2019-07-01 ENCOUNTER — Other Ambulatory Visit: Payer: BC Managed Care – PPO

## 2019-07-01 DIAGNOSIS — E039 Hypothyroidism, unspecified: Secondary | ICD-10-CM

## 2019-07-01 NOTE — Addendum Note (Signed)
Addended by: Kaylyn Lim I on: 07/01/2019 04:10 PM   Modules accepted: Orders

## 2019-07-02 LAB — TSH: TSH: 0.898 u[IU]/mL (ref 0.450–4.500)

## 2019-07-04 ENCOUNTER — Ambulatory Visit: Payer: BC Managed Care – PPO | Admitting: Endocrinology

## 2019-07-04 ENCOUNTER — Other Ambulatory Visit: Payer: Self-pay

## 2019-07-04 ENCOUNTER — Encounter: Payer: Self-pay | Admitting: Endocrinology

## 2019-07-04 VITALS — BP 120/82 | HR 55 | Ht 64.0 in | Wt 159.5 lb

## 2019-07-04 DIAGNOSIS — E039 Hypothyroidism, unspecified: Secondary | ICD-10-CM | POA: Diagnosis not present

## 2019-07-04 DIAGNOSIS — E7800 Pure hypercholesterolemia, unspecified: Secondary | ICD-10-CM | POA: Diagnosis not present

## 2019-07-04 DIAGNOSIS — R635 Abnormal weight gain: Secondary | ICD-10-CM | POA: Diagnosis not present

## 2019-07-04 NOTE — Progress Notes (Signed)
Patient ID: Rachel [name], female   DOB: [birth year], 54 y.o.   MRN: [id number]   Reason for Appointment:  Hypothyroidism, followup visit   History of Present Illness:   HYPOTHYROIDISM was first diagnosed in 1992  With a TSH of 51  Her thyroxine dosage has been typically difficult to regulate and requires fairly frequent adjustments of dosage despite her being compliant with brand name Synthroid. Over the years she has taken a wide range of doses for her supplement  In 02/2014 her TSH was  low at 0.05 and her dose was reduced to 100 g daily Subsequently her TSH was normal  She was empirically tried on [place] Thyroid 60 mg alternating with 90 mg in 06/2014 when she was feeling more tired, colder than usual on her therapeutic levothyroxine dose She thinks she may have felt slightly better with this, however because of mild tachycardia she was started back on levothyroxine 100 g       TSH was low in the early part of 2017 and her dose had been gradually reduced  RECENT history:  She had not been seen in follow-up since 12/2016 until 06/2018 At that time she had complained of feeling exhausted and drained although she was not sure whether this is related to her work schedule She has a chronic cold intolerance At that time she was also complaining of some hair loss  Since her TSH was 6.1 she was changed to Unithroid 100 mcg daily  She felt better with her energy level after increasing the dose She has been on the same dose subsequently  She is not having any symptoms of unusual or unexpected fatigue She is a little concerned about dry skin and hair being brittle Also is concerned about progressive weight gain No  change in cold intolerance  She is taking Unithroid consistently, taking the tablet in the morning before breakfast without any other medication or supplement. Previously brand-name name Synthroid was not covered  TSH has been followed by her oncologist on routine  lab work also and her TSH is consistently normal although slightly lower at 0.9   Wt Readings from Last 3 Encounters:  07/04/19 159 lb 8 oz (72.3 kg)  05/10/19 156 lb (70.8 kg)  04/07/19 156 lb (70.8 kg)    Lab results:  Lab Results  Component Value Date   TSH 0.898 07/01/2019   TSH 2.869 04/07/2019   TSH 1.249 10/07/2018   FREET4 1.19 06/25/2018   FREET4 0.96 06/06/2016   FREET4 1.04 12/05/2015     Allergies as of 07/04/2019      Reactions   Erythromycin Nausea And Vomiting      Medication List       Accurate as of Jul 04, 2019  4:28 PM. If you have any questions, ask your nurse or doctor.        butalbital-acetaminophen-caffeine 50-325-40 MG tablet Commonly known as: FIORICET TAKE 1 TABLET BY MOUTH EVERY 6 HOURS AS NEEDED FOR HEADACHE   fluticasone 50 MCG/ACT nasal spray Commonly known as: FLONASE INHALE 2 SPRASY IN EACH NOSTRIL ONCE DAILY   levocetirizine 5 MG tablet Commonly known as: XYZAL TAKE 1 TABLET BY MOUTH EVERY DAY IN THE EVENING   promethazine 25 MG tablet Commonly known as: PHENERGAN TAKE 1 TABLET BY MOUTH EVERY 8 HOURS AS NEEDED FOR NAUSEA/VOMITING   rizatriptan 10 MG tablet Commonly known as: MAXALT TAKE 1 TABLET BY MOUTH EVERY DAY AS NEEDED FOR MIGRANE   Unithroid 100 MCG  tablet Generic drug: levothyroxine TAKE 1 TABLET BY MOUTH EVERY MORNING BEFORE BREAKFAST   VITAMIN D PO Take 2,000 Units by mouth.        Past Medical History:  Diagnosis Date  . ASCUS (atypical squamous cells of undetermined significance) on Pap smear 02/2006, 05/2006   NEG HPV ----- PAP 12/2007 WNL  . ASCUS of cervix with negative high risk HPV 03/2018  . Cancer ([place]) 2001   HISTORY OF STAGE I INFILTRATING DUCTAL CARCINOMA OF RIGHT BREAST  . Chiari malformation type I ([place])   . Chronic headache   . Family history of uterine cancer   . History of breast cancer 2000  . Hypercholesteremia   . Hypothyroidism   . Osteoporosis 09/2018   T score -2.4.  Prior T  score -2.5.  DEXA stable from prior study.  . Personal history of chemotherapy 2001  . Personal history of radiation therapy 2001    Past Surgical History:  Procedure Laterality Date  . BREAST BIOPSY Left   . BREAST EXCISIONAL BIOPSY Left   . BREAST EXCISIONAL BIOPSY Right   . BREAST LUMPECTOMY Right 2001  . CESAREAN SECTION    . COLPOSCOPY    . PELVIC LAPAROSCOPY    . TONSILLECTOMY      Family History  Problem Relation Age of Onset  . Diabetes Father   . Uterine cancer Maternal Aunt 60    Social History:  reports that she has never smoked. She has never used smokeless tobacco. She reports current alcohol use. She reports that she does not use drugs.  Allergies:  Allergies  Allergen Reactions  . Erythromycin Nausea And Vomiting    Review of systems:   History of osteopenia treated by other physicians  Has history of hypercholesterolemia, untreated She has not followed up with her PCP  Lab Results  Component Value Date   CHOL 302 (H) 04/07/2019   HDL 51 04/07/2019   LDLCALC 223 (H) 04/07/2019   TRIG 139 04/07/2019   CHOLHDL 5.9 04/07/2019   She was told that she has been menopausal since her 19s   Examination:   BP 120/82 (BP Location: Left Arm, Patient Position: Sitting, Cuff Size: Normal)   Pulse (!) 55   Ht 5\' 4"  (1.626 m)   Wt 159 lb 8 oz (72.3 kg)   SpO2 97%   BMI 27.38 kg/m   She looks well, no puffiness of the face or eyes   Thyroid not palpable Biceps reflexes show normal relaxation   Skin appears normal.     Assessment    Hypothyroidism, long-standing with history of variability in her TSH and has required various doses of the last several years  Has not required a change in her dosage since her last visit in 12/2016 She is taking Unithroid 100 mcg daily and is regular with this Subjectively doing well except for progressive weight gain over the last few years  She may have mild fluctuation in her TSH  TSH is 0.9  WEIGHT gain: She  thinks she is doing well with her diet as far as cutting back on calories and fats as well as trying to walk 30 minutes, 5 days a week.  Reassured her that her hypothyroidism is not playing any role  Hypercholesterolemia, untreated   Plan: She will stay on the same dose of Unithroid She will need to follow-up with her PCP regarding possible referral to the weight management program Also likely needs a statin because of her LDL being significantly  over her and 89   Follow-up in 6 months   Elayne Snare 07/04/2019, 4:28 PM

## 2019-08-08 ENCOUNTER — Other Ambulatory Visit: Payer: Self-pay | Admitting: Endocrinology

## 2019-10-03 ENCOUNTER — Other Ambulatory Visit: Payer: Self-pay

## 2019-10-03 ENCOUNTER — Inpatient Hospital Stay (HOSPITAL_BASED_OUTPATIENT_CLINIC_OR_DEPARTMENT_OTHER): Payer: BC Managed Care – PPO | Admitting: Hematology & Oncology

## 2019-10-03 ENCOUNTER — Inpatient Hospital Stay: Payer: BC Managed Care – PPO | Attending: Hematology & Oncology

## 2019-10-03 ENCOUNTER — Encounter: Payer: Self-pay | Admitting: *Deleted

## 2019-10-03 ENCOUNTER — Encounter: Payer: Self-pay | Admitting: Hematology & Oncology

## 2019-10-03 ENCOUNTER — Telehealth: Payer: Self-pay | Admitting: Gastroenterology

## 2019-10-03 VITALS — BP 123/69 | HR 67 | Temp 98.2°F | Resp 18 | Wt 158.0 lb

## 2019-10-03 DIAGNOSIS — Z79899 Other long term (current) drug therapy: Secondary | ICD-10-CM | POA: Insufficient documentation

## 2019-10-03 DIAGNOSIS — E033 Postinfectious hypothyroidism: Secondary | ICD-10-CM

## 2019-10-03 DIAGNOSIS — E785 Hyperlipidemia, unspecified: Secondary | ICD-10-CM | POA: Insufficient documentation

## 2019-10-03 DIAGNOSIS — Z853 Personal history of malignant neoplasm of breast: Secondary | ICD-10-CM | POA: Insufficient documentation

## 2019-10-03 DIAGNOSIS — Z171 Estrogen receptor negative status [ER-]: Secondary | ICD-10-CM

## 2019-10-03 LAB — CMP (CANCER CENTER ONLY)
ALT: 25 U/L (ref 0–44)
AST: 19 U/L (ref 15–41)
Albumin: 4.5 g/dL (ref 3.5–5.0)
Alkaline Phosphatase: 78 U/L (ref 38–126)
Anion gap: 6 (ref 5–15)
BUN: 18 mg/dL (ref 6–20)
CO2: 30 mmol/L (ref 22–32)
Calcium: 10 mg/dL (ref 8.9–10.3)
Chloride: 106 mmol/L (ref 98–111)
Creatinine: 0.92 mg/dL (ref 0.44–1.00)
GFR, Est AFR Am: >60 mL/min (ref >60)
GFR, Estimated: >60 mL/min (ref >60)
Glucose, Bld: 100 mg/dL — ABNORMAL HIGH (ref 70–99)
Potassium: 4.5 mmol/L (ref 3.5–5.1)
Sodium: 142 mmol/L (ref 135–145)
Total Bilirubin: 0.7 mg/dL (ref 0.3–1.2)
Total Protein: 7 g/dL (ref 6.5–8.1)

## 2019-10-03 LAB — LIPID PANEL
Cholesterol: 246 mg/dL — ABNORMAL HIGH (ref 0–200)
HDL: 40 mg/dL — ABNORMAL LOW (ref >40)
LDL Cholesterol: 171 mg/dL — ABNORMAL HIGH (ref 0–99)
Total CHOL/HDL Ratio: 6.2 RATIO
Triglycerides: 175 mg/dL — ABNORMAL HIGH (ref <150)
VLDL: 35 mg/dL (ref 0–40)

## 2019-10-03 LAB — CBC WITH DIFFERENTIAL (CANCER CENTER ONLY)
Abs Immature Granulocytes: 0.02 K/uL (ref 0.00–0.07)
Basophils Absolute: 0.1 K/uL (ref 0.0–0.1)
Basophils Relative: 1 %
Eosinophils Absolute: 0.1 K/uL (ref 0.0–0.5)
Eosinophils Relative: 2 %
HCT: 44.2 % (ref 36.0–46.0)
Hemoglobin: 14.5 g/dL (ref 12.0–15.0)
Immature Granulocytes: 0 %
Lymphocytes Relative: 41 %
Lymphs Abs: 2.3 K/uL (ref 0.7–4.0)
MCH: 30 pg (ref 26.0–34.0)
MCHC: 32.8 g/dL (ref 30.0–36.0)
MCV: 91.3 fL (ref 80.0–100.0)
Monocytes Absolute: 0.4 K/uL (ref 0.1–1.0)
Monocytes Relative: 8 %
Neutro Abs: 2.8 K/uL (ref 1.7–7.7)
Neutrophils Relative %: 48 %
Platelet Count: 241 K/uL (ref 150–400)
RBC: 4.84 MIL/uL (ref 3.87–5.11)
RDW: 12.7 % (ref 11.5–15.5)
WBC Count: 5.7 K/uL (ref 4.0–10.5)
nRBC: 0 % (ref 0.0–0.2)

## 2019-10-03 LAB — TSH: TSH: 0.565 u[IU]/mL (ref 0.308–3.960)

## 2019-10-03 LAB — VITAMIN D 25 HYDROXY (VIT D DEFICIENCY, FRACTURES): Vit D, 25-Hydroxy: 46.25 ng/mL (ref 30–100)

## 2019-10-03 NOTE — Progress Notes (Signed)
Hematology and Oncology Follow Up Visit  Floretta Petro [facility] [id number] Jun 04, 1965 54 y.o. 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  Surges is back for followup.  She has been quite busy since we last saw her.  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  She really is enjoying this.  The bad news is that she and her husband have not yet filled felt their house up in [facility].  There are cost issues that need to be resolved.  As always, we talked about her grandchildren.  She has 2 grandchildren and a third on the way.  So happy for her.  I am not happy about her cholesterol.  Her cholesterol was 246.  This actually is a bit better than before.  Her HDL is low at 40.  Her cholesterol/HDL ratio was 6.2.  She just does not want take cholesterol medications.  Overall she is not someone who likes to take medicines.  She has had no problems with fever.  She has been very cautious with the coronavirus.  She has been vaccinated.  She has had no problems with 9.  On.  We checked her cholesterol today.  It was over 300.  Her cholesterol/HDL ratio was 5.9.  She is expecting a second grandchild.  This will be a granddaughter.  She will be born in late March.  She already has a grandson which she really enjoys.  I am happy that she is teaching.  She is a born Tourist information centre manager.  She really enjoys teaching.  She is teaching math.  Her performance status is ECOG 0   Medications:  Current Outpatient Medications:  .  butalbital-acetaminophen-caffeine (FIORICET, ESGIC) 50-325-40 MG per tablet, TAKE 1 TABLET BY MOUTH EVERY 6 HOURS AS NEEDED FOR HEADACHE, Disp: 20 tablet, Rfl: 0 .  Cholecalciferol (VITAMIN D PO), Take 2,000 Units by mouth., Disp: , Rfl:  .  fluticasone (FLONASE) 50 MCG/ACT nasal spray, INHALE 2 SPRASY IN EACH NOSTRIL ONCE  DAILY, Disp: 16 g, Rfl: 11 .  promethazine (PHENERGAN) 25 MG tablet, TAKE 1 TABLET BY MOUTH EVERY 8 HOURS AS NEEDED FOR NAUSEA/VOMITING, Disp: 20 tablet, Rfl: 0 .  rizatriptan (MAXALT) 10 MG tablet, TAKE 1 TABLET BY MOUTH EVERY DAY AS NEEDED FOR MIGRANE, Disp: 6 tablet, Rfl: 5 .  UNITHROID 100 MCG tablet, TAKE 1 TABLET BY MOUTH EVERY DAY BEFORE BREAKFAST, Disp: 90 tablet, Rfl: 3  Allergies:  Allergies  Allergen Reactions  . Erythromycin Nausea And Vomiting    Past Medical History, Surgical history, Social history, and Family History were reviewed and updated.  Review of Systems: Review of Systems  Constitutional: Negative.   HENT: Positive for congestion.   Eyes: Negative.   Respiratory: Negative.   Cardiovascular: Negative.   Gastrointestinal: Negative.   Genitourinary: Negative.   Musculoskeletal: Negative.   Skin: Negative.   Neurological: Negative.   Endo/Heme/Allergies: Negative.   Psychiatric/Behavioral: Negative.      Physical Exam:  weight is 158 lb (71.7 kg). Her oral temperature is 98.2 F (36.8 C). Her blood pressure is 123/69 and her pulse is 67. Her respiration is 18 and oxygen saturation is 100%.   Physical Exam Vitals reviewed.  Constitutional:      Comments: Her breast exam shows right breast with no masses, edema or erythema.  She has a well-healed lumpectomy at the 10 o'clock position on the right breast.  No erythema or masses are noted.  There is no right axillary adenopathy.  Left breast shows no masses, edema or erythema.  The biopsy site is well-healed.  There is no left axillary adenopathy.  HENT:     Head: Normocephalic and atraumatic.  Eyes:     Pupils: Pupils are equal, round, and reactive to light.  Cardiovascular:     Rate and Rhythm: Normal rate and regular rhythm.     Heart sounds: Normal heart sounds.  Pulmonary:     Effort: Pulmonary effort is normal.     Breath sounds: Normal breath sounds.  Abdominal:     General: Bowel sounds are  normal.     Palpations: Abdomen is soft.  Musculoskeletal:        General: No tenderness or deformity. Normal range of motion.     Cervical back: Normal range of motion.  Lymphadenopathy:     Cervical: No cervical adenopathy.  Skin:    General: Skin is warm and dry.     Findings: No erythema or rash.  Neurological:     Mental Status: She is alert and oriented to person, place, and time.  Psychiatric:        Behavior: Behavior normal.        Thought Content: Thought content normal.        Judgment: Judgment normal.      Lab Results  Component Value Date   WBC 5.7 10/03/2019   HGB 14.5 10/03/2019   HCT 44.2 10/03/2019   MCV 91.3 10/03/2019   PLT 241 10/03/2019     Chemistry      Component Value Date/Time   NA 142 10/03/2019 0914   NA 140 09/26/2016 0743   NA 143 09/21/2015 0854   K 4.5 10/03/2019 0914   K 3.8 09/26/2016 0743   K 4.4 09/21/2015 0854   CL 106 10/03/2019 0914   CL 104 09/26/2016 0743   CO2 30 10/03/2019 0914   CO2 30 09/26/2016 0743   CO2 28 09/21/2015 0854   BUN 18 10/03/2019 0914   BUN 14 09/26/2016 0743   BUN 13.9 09/21/2015 0854   CREATININE 0.92 10/03/2019 0914   CREATININE 0.87 01/01/2018 1055   CREATININE 0.8 09/21/2015 0854      Component Value Date/Time   CALCIUM 10.0 10/03/2019 0914   CALCIUM 9.4 09/26/2016 0743   CALCIUM 10.1 09/21/2015 0854   ALKPHOS 78 10/03/2019 0914   ALKPHOS 80 09/26/2016 0743   ALKPHOS 98 09/21/2015 0854   AST 19 10/03/2019 0914   AST 37 (H) 09/21/2015 0854   ALT 25 10/03/2019 0914   ALT 35 09/26/2016 0743   ALT 57 (H) 09/21/2015 0854   BILITOT 0.7 10/03/2019 0914   BILITOT 0.53 09/21/2015 0854        Impression and Plan: Ms. Sadowski is a 54 year old white female with a history of stage I ductal carcinoma of the right breast. She was diagnosed 18 years ago. 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 I  think that  she is cured.  So far, everything is going quite well.  She is due for a mammogram in December.  I know the  last mammogram showed some changes.  We talked about possibly Femara which she would like to hold off on.  We did check her vitamin D level.  This is okay at 62.  We did check her TSH.  This is also  okay at 0.86.  I did always enjoyed talking to [place].  She is a lot of fun to talk to.  We have known her now probably for close to 20 years.     Volanda Napoleon, MD 8/23/20213:47 PM

## 2019-10-03 NOTE — Telephone Encounter (Signed)
I rec'd a call from Dr. Marin Olp after he saw this patient in clinic.  Rachel [name] saw me in early 2020 for dysphagia and had a Schatzki ring on EGD. She is having recurrent dysphagia, and it sounds like she needs another EGD for dilation.  Please contact her in the next couple of days and arrange an EGD on my schedule at her earliest convenience.  ( I am on hospital consult service this week but have [place] openings as soon as next week.)  - HD

## 2019-10-05 ENCOUNTER — Other Ambulatory Visit: Payer: BC Managed Care – PPO

## 2019-10-05 ENCOUNTER — Ambulatory Visit: Payer: BC Managed Care – PPO | Admitting: Hematology & Oncology

## 2019-10-12 NOTE — Telephone Encounter (Signed)
Lm on vm for patient to return call 

## 2019-10-18 ENCOUNTER — Encounter: Payer: Self-pay | Admitting: Gastroenterology

## 2019-10-19 NOTE — Telephone Encounter (Signed)
Lm on vm for patient to return call. Patient has been scheduled for EGD on 11/10/19 at 10 am.

## 2019-10-20 NOTE — Telephone Encounter (Signed)
Left patient a message letting her know that I was just calling to get her scheduled for her EGD but she has already been scheduled for her pre visit and EGD. Advised patient that if she had any questions that she should give [place] a call back

## 2019-10-25 ENCOUNTER — Ambulatory Visit (AMBULATORY_SURGERY_CENTER): Payer: Self-pay

## 2019-10-25 ENCOUNTER — Other Ambulatory Visit: Payer: Self-pay

## 2019-10-25 ENCOUNTER — Encounter: Payer: Self-pay | Admitting: Gastroenterology

## 2019-10-25 VITALS — Ht 64.0 in | Wt 160.8 lb

## 2019-10-25 DIAGNOSIS — R1310 Dysphagia, unspecified: Secondary | ICD-10-CM

## 2019-10-25 DIAGNOSIS — R1319 Other dysphagia: Secondary | ICD-10-CM

## 2019-10-25 NOTE — Progress Notes (Signed)
No egg or soy allergy known to patient  No issues with past sedation with any surgeries or procedures---pt reports hx of PONV- No intubation problems in the past  No FH of Malignant Hyperthermia No diet pills per patient No home 02 use per patient  No blood thinners per patient  Pt denies issues with constipation  No A fib or A flutter  EMMI video via [place] 19 guidelines implemented in PV today with Pt and RN  COVID vaccines completed on 04/2019 per pt;  Due to the COVID-19 pandemic we are asking patients to follow these guidelines. Please only bring one care partner. Please be aware that your care partner may wait in the car in the parking lot or if they feel like they will be too hot to wait in the car, they may wait in the lobby on the 4th floor. All care partners are required to wear a mask the entire time (we do not have any that we can provide them), they need to practice social distancing, and we will do a Covid check for all patient's and care partners when you arrive. Also we will check their temperature and your temperature. If the care partner waits in their car they need to stay in the parking lot the entire time and we will call them on their cell phone when the patient is ready for discharge so they can bring the car to the front of the building. Also all patient's will need to wear a mask into building.

## 2019-11-10 ENCOUNTER — Encounter: Payer: Self-pay | Admitting: Gastroenterology

## 2019-11-10 ENCOUNTER — Ambulatory Visit (AMBULATORY_SURGERY_CENTER): Payer: BC Managed Care – PPO | Admitting: Gastroenterology

## 2019-11-10 ENCOUNTER — Other Ambulatory Visit: Payer: Self-pay

## 2019-11-10 VITALS — BP 104/65 | HR 63 | Temp 98.0°F | Resp 17 | Ht 64.0 in | Wt 160.8 lb

## 2019-11-10 DIAGNOSIS — R1319 Other dysphagia: Secondary | ICD-10-CM

## 2019-11-10 DIAGNOSIS — K222 Esophageal obstruction: Secondary | ICD-10-CM | POA: Diagnosis not present

## 2019-11-10 DIAGNOSIS — R1310 Dysphagia, unspecified: Secondary | ICD-10-CM

## 2019-11-10 MED ORDER — SODIUM CHLORIDE 0.9 % IV SOLN: 500.0000 mL | Freq: Once | INTRAVENOUS | Status: DC

## 2019-11-10 MED ORDER — SODIUM CHLORIDE 0.9 % IV SOLN
4.0000 mg | Freq: Once | INTRAVENOUS | Status: AC
  Administered 2019-11-10: 4 mg via INTRAVENOUS

## 2019-11-10 NOTE — Progress Notes (Signed)
To PACU, VSS. Report to [email or web address] 

## 2019-11-10 NOTE — Patient Instructions (Signed)
YOU HAD AN ENDOSCOPIC PROCEDURE TODAY AT THE [facility]:   Refer to the procedure report that was given to you for any specific questions about what was found during the examination.  If the procedure report does not answer your questions, please call your gastroenterologist to clarify.  If you requested that your care partner not be given the details of your procedure findings, then the procedure report has been included in a sealed envelope for you to review at your convenience later.  YOU SHOULD EXPECT: Some feelings of bloating in the abdomen. Passage of more gas than usual.  Walking can help get rid of the air that was put into your GI tract during the procedure and reduce the bloating. If you had a lower endoscopy (such as a colonoscopy or flexible sigmoidoscopy) you may notice spotting of blood in your stool or on the toilet paper. If you underwent a bowel prep for your procedure, you may not have a normal bowel movement for a few days.  Please Note:  You might notice some irritation and congestion in your nose or some drainage.  This is from the oxygen used during your procedure.  There is no need for concern and it should clear up in a day or so.  SYMPTOMS TO REPORT IMMEDIATELY:    Following upper endoscopy (EGD)  Vomiting of blood or coffee ground material  New chest pain or pain under the shoulder blades  Painful or persistently difficult swallowing  New shortness of breath  Fever of 100F or higher  Black, tarry-looking stools  For urgent or emergent issues, a gastroenterologist can be reached at any hour by calling [phone]. Do not use MyChart messaging for urgent concerns.    DIET:  We do recommend a small meal at first, but then you may proceed to your regular diet.  Drink plenty of fluids but you should avoid alcoholic beverages for 24 hours.  ACTIVITY:  You should plan to take it easy for the rest of today and you should NOT DRIVE or use heavy machinery  until tomorrow (because of the sedation medicines used during the test).    FOLLOW UP: Our staff will call the number listed on your records 48-72 hours following your procedure to check on you and address any questions or concerns that you may have regarding the information given to you following your procedure. If we do not reach you, we will leave a message.  We will attempt to reach you two times.  During this call, we will ask if you have developed any symptoms of COVID 19. If you develop any symptoms (ie: fever, flu-like symptoms, shortness of breath, cough etc.) before then, please call [phone].  If you test positive for Covid 19 in the 2 weeks post procedure, please call and report this information to us.    If any biopsies were taken you will be contacted by phone or by letter within the next 1-3 weeks.  Please call us at [phone] if you have not heard about the biopsies in 3 weeks.    SIGNATURES/CONFIDENTIALITY: You and/or your care partner have signed paperwork which will be entered into your electronic medical record.  These signatures attest to the fact that that the information above on your After Visit Summary has been reviewed and is understood.  Full responsibility of the confidentiality of this discharge information lies with you and/or your care-partner. 

## 2019-11-10 NOTE — Progress Notes (Signed)
Pt's states no medical or surgical changes since previsit or office visit.  VS MO 

## 2019-11-10 NOTE — Progress Notes (Signed)
Called to room to assist during endoscopic procedure.  Patient ID and intended procedure confirmed with present staff. Received instructions for my participation in the procedure from the performing physician.  

## 2019-11-10 NOTE — Op Note (Signed)
[facility] Patient Name: Rachel [name] Procedure Date: 11/10/2019 9:30 AM MRN: [id number] Endoscopist: Mallie Mussel L. Loletha Carrow , MD Age: 54 Referring MD:  Date of Birth: 01-20-1966 Gender: Female Account #: [id number] Procedure:                Upper GI endoscopy Indications:              Esophageal dysphagia (Schaztki ring Jan 2020                            treated with Bx for ring disruption) Medicines:                Monitored Anesthesia Care Procedure:                Pre-Anesthesia Assessment:                           - Prior to the procedure, a History and Physical                            was performed, and patient medications and                            allergies were reviewed. The patient's tolerance of                            previous anesthesia was also reviewed. The risks                            and benefits of the procedure and the sedation                            options and risks were discussed with the patient.                            All questions were answered, and informed consent                            was obtained. Prior Anticoagulants: The patient has                            taken no previous anticoagulant or antiplatelet                            agents. ASA Grade Assessment: II - A patient with                            mild systemic disease. After reviewing the risks                            and benefits, the patient was deemed in                            satisfactory condition to undergo the procedure.  After obtaining informed consent, the endoscope was                            passed under direct vision. Throughout the                            procedure, the patient's blood pressure, pulse, and                            oxygen saturations were monitored continuously. The                            Endoscope was introduced through the mouth, and                            advanced to the second part  of duodenum. The upper                            GI endoscopy was accomplished without difficulty.                            The patient tolerated the procedure well. Scope In: Scope Out: Findings:                 A low-grade of narrowing Schatzki ring was found at                            the gastroesophageal junction. The scope was                            withdrawn. Dilation was performed with a Maloney                            dilator with mild resistance at 52 Fr. The dilation                            site was examined following endoscope reinsertion                            and showed moderate mucosal disruption and complete                            resolution of luminal narrowing.                           The exam of the esophagus was otherwise normal.                           The stomach was normal.                           The cardia and gastric fundus were normal on  retroflexion.                           The examined duodenum was normal. Complications:            No immediate complications. Estimated Blood Loss:     Estimated blood loss was minimal. Impression:               - Low-grade of narrowing Schatzki ring.                           - Normal stomach.                           - Normal examined duodenum.                           - No specimens collected. Recommendation:           - Patient has a contact number available for                            emergencies. The signs and symptoms of potential                            delayed complications were discussed with the                            patient. Return to normal activities tomorrow.                            Written discharge instructions were provided to the                            patient.                           - CLear liquid diet for 2 hours, then resume                            previous diet.                           - Continue present medications.                            - Repeat upper endoscopy as needed. Jerard Bays L. Loletha Carrow, MD 11/10/2019 9:52:37 AM This report has been signed electronically.

## 2019-11-14 ENCOUNTER — Telehealth: Payer: Self-pay

## 2019-11-14 NOTE — Telephone Encounter (Signed)
I explained to her it could be some swelling and to call if it persists a week after the procedure, thank you.

## 2019-11-14 NOTE — Telephone Encounter (Signed)
  Follow up Call-  Call back number 11/10/2019 02/23/2018  Post procedure Call Back phone  # [phone]  Permission to leave phone message Yes Yes  Some recent data might be hidden     Patient questions:  Do you have a fever, pain , or abdominal swelling? No. Pain Score  0 *  Have you tolerated food without any problems? Yes.    Have you been able to return to your normal activities? Yes.    Do you have any questions about your discharge instructions: Diet   No. Medications  No. Follow up visit  Yes.    Do you have questions or concerns about your Care? Yes.    Actions: * If pain score is 4 or above: Physician/ provider Notified : Nelida Meuse, MD   1. Have you developed a fever since your procedure? no  2.   Have you had an respiratory symptoms (SOB or cough) since your procedure? no  3.   Have you tested positive for COVID 19 since your procedure no  4.   Have you had any family members/close contacts diagnosed with the COVID 19 since your procedure?  no   If yes to any of these questions please route to Joylene John, RN and Joella Prince, Therapist, sports.

## 2019-11-14 NOTE — Telephone Encounter (Signed)
This was an EGD/dilation last week.  If you hear from her, please let her know that dilation-related edema may cause dysphagia to persist for a week or so after procedures.  If the trouble persists beyond that, please let me know as she would likely need esophageal manometry testing.  - HD

## 2020-01-13 ENCOUNTER — Other Ambulatory Visit: Payer: Self-pay

## 2020-01-13 ENCOUNTER — Encounter: Payer: Self-pay | Admitting: Family Medicine

## 2020-01-13 ENCOUNTER — Ambulatory Visit: Payer: BC Managed Care – PPO | Admitting: Family Medicine

## 2020-01-13 VITALS — BP 104/60 | HR 66 | Temp 98.3°F | Resp 14 | Ht 64.0 in | Wt 160.0 lb

## 2020-01-13 DIAGNOSIS — J0100 Acute maxillary sinusitis, unspecified: Secondary | ICD-10-CM | POA: Diagnosis not present

## 2020-01-13 MED ORDER — PREDNISONE 10 MG PO TABS: ORAL_TABLET | ORAL | 0 refills | Status: DC

## 2020-01-13 MED ORDER — AMOXICILLIN-POT CLAVULANATE 875-125 MG PO TABS: 1.0000 | ORAL_TABLET | Freq: Two times a day (BID) | ORAL | 0 refills | Status: DC

## 2020-01-13 MED ORDER — FLUCONAZOLE 150 MG PO TABS: 150.0000 mg | ORAL_TABLET | Freq: Once | ORAL | 0 refills | Status: AC

## 2020-01-13 NOTE — Patient Instructions (Signed)
F/U as needed

## 2020-01-13 NOTE — Progress Notes (Signed)
   Subjective:    Patient ID: Rachel [name], female    DOB: 30-Mar-1965, 54 y.o.   MRN: [id number]  Patient presents for Illness (x1 week- sinus pressure, nasal congestion, low grade fever/ chills-hx of sinus infection- had URI (COVID -) 2 weeks age)   Pt here with worsening sinus pressure and drainage. She had URI symptoms 2-3 weeks ago, had covid testing done which was negativ.e Past week, her sinus symptoms worsened  her glands are swollen, post nasal drip, foul odor to mucous, thick discharge  She uses decongest, saline/flinase not helping  No fever, but has chills   No significant cough    Review Of Systems:  GEN- denies fatigue, fever, weight loss,weakness, recent illness HEENT- denies eye drainage, change in vision,+ nasal discharge, CVS- denies chest pain, palpitations RESP- denies SOB, +cough, wheeze ABD- denies N/V, change in stools, abd pain GU- denies dysuria, hematuria, dribbling, incontinence MSK- denies joint pain, muscle aches, injury Neuro- + headache,denies  dizziness, syncope, seizure activity       Objective:    BP 104/60   Pulse 66   Temp 98.3 F (36.8 C) (Temporal)   Resp 14   Ht 5\' 4"  (1.626 m)   Wt 160 lb (72.6 kg)   SpO2 97%   BMI 27.46 kg/m  GEN- NAD, alert and oriented x3 HEENT- PERRL, EOMI, non injected sclera, pink conjunctiva, MMM, oropharynx mild injection, TM clear bilat no effusion,  + maxillary sinus tenderness, inflammed turbinates,  Nasal drainage  Neck- Supple, shotty  LAD CVS- RRR, no murmur RESP-CTAB EXT- No edema Pulses- Radial 2+          Assessment & Plan:      Problem List Items Addressed This Visit    None    Visit Diagnoses    Acute maxillary sinusitis, recurrence not specified    -  Primary   Sinusitis, history of allergy/sinus issues, negative covid testing, treat with augmentin/steroids, nasal saline   Relevant Medications   amoxicillin-clavulanate (AUGMENTIN) 875-125 MG tablet   predniSONE (DELTASONE) 10 MG  tablet      Note: This dictation was prepared with Dragon dictation along with smaller phrase technology. Any transcriptional errors that result from this process are unintentional.

## 2020-01-15 ENCOUNTER — Encounter: Payer: Self-pay | Admitting: Family Medicine

## 2020-01-24 ENCOUNTER — Other Ambulatory Visit: Payer: Self-pay

## 2020-01-24 ENCOUNTER — Ambulatory Visit: Payer: BC Managed Care – PPO | Admitting: Family Medicine

## 2020-01-24 VITALS — BP 120/90 | HR 89 | Temp 98.4°F | Ht 64.0 in | Wt 160.0 lb

## 2020-01-24 DIAGNOSIS — J0101 Acute recurrent maxillary sinusitis: Secondary | ICD-10-CM | POA: Diagnosis not present

## 2020-01-24 MED ORDER — LEVOFLOXACIN 750 MG PO TABS: 750.0000 mg | ORAL_TABLET | Freq: Every day | ORAL | 0 refills | Status: DC

## 2020-01-24 NOTE — Progress Notes (Signed)
Subjective:    Patient ID: Rachel [name], female    DOB: 1965-06-12, 54 y.o.   MRN: [id number]  HPI Patient recently saw my partner and was diagnosed with a sinus infection.  She was treated with steroids as well as Augmentin.  However the symptoms are worsening.  The patient reports severe pain in her right frontal and right maxillary sinus.  She reports purulent nasal drainage.  She states whenever she blows her nose it is thick and brown and foul-smelling similar to a "pig pen".  She reports headaches and subjective fevers despite taking 10 days of antibiotics.  Symptoms began with an upper respiratory infection more than 4 weeks ago.  She sought medical attention last week. Past Medical History:  Diagnosis Date  . ASCUS (atypical squamous cells of undetermined significance) on Pap smear 02/2006, 05/2006   NEG HPV ----- PAP 12/2007 WNL  . ASCUS of cervix with negative high risk HPV 03/2018  . 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 Chiari malformation type I ([place])   . Chronic headache   . Family history of uterine cancer   . History of breast cancer 2000  . Hypercholesteremia    diet controlled  . Hypothyroidism    on meds  . Osteoporosis 09/2018   T score -2.4.  Prior T score -2.5.  DEXA stable from prior study.  . Personal history of chemotherapy 2001  . Personal history of radiation therapy 2001   Past Surgical History:  Procedure Laterality Date  . BREAST BIOPSY Left   . BREAST EXCISIONAL BIOPSY Left 2021  . BREAST EXCISIONAL BIOPSY Right   . BREAST LUMPECTOMY Right 2001  . CESAREAN SECTION    . COLPOSCOPY    . PELVIC LAPAROSCOPY    . TONSILLECTOMY    . TONSILLECTOMY  1980  . WISDOM TOOTH EXTRACTION  1986   Current Outpatient Medications on File Prior to Visit  Medication Sig Dispense Refill  . butalbital-acetaminophen-caffeine (FIORICET, ESGIC) 50-325-40 MG per tablet TAKE 1 TABLET BY MOUTH EVERY 6 HOURS AS NEEDED FOR  HEADACHE 20 tablet 0  . Cholecalciferol (VITAMIN D PO) Take 2,000 Units by mouth.    . fluticasone (FLONASE) 50 MCG/ACT nasal spray INHALE 2 SPRASY IN EACH NOSTRIL ONCE DAILY 16 g 11  . neomycin-polymyxin b-dexamethasone (MAXITROL) 3.5-10000-0.1 SUSP SMARTSIG:In Eye(s)    . promethazine (PHENERGAN) 25 MG tablet TAKE 1 TABLET BY MOUTH EVERY 8 HOURS AS NEEDED FOR NAUSEA/VOMITING 20 tablet 0  . rizatriptan (MAXALT) 10 MG tablet TAKE 1 TABLET BY MOUTH EVERY DAY AS NEEDED FOR MIGRANE 6 tablet 5  . UNITHROID 100 MCG tablet TAKE 1 TABLET BY MOUTH EVERY DAY BEFORE BREAKFAST 90 tablet 3   No current facility-administered medications on file prior to visit.   Allergies  Allergen Reactions  . Erythromycin Nausea And Vomiting   Social History   Socioeconomic History  . Marital status: Married    Spouse name: Not on file  . Number of children: Not on file  . Years of education: Not on file  . Highest education level: Not on file  Occupational History  . Not on file  Tobacco Use  . Smoking status: Never Smoker  . Smokeless tobacco: Never Used  . Tobacco comment: never used tobacco  Vaping Use  . Vaping Use: Never used  Substance and Sexual Activity  . Alcohol use: Not Currently    Alcohol/week: 0.0 standard drinks  .  Drug use: No  . Sexual activity: Yes    Birth control/protection: Post-menopausal    Comment: 1st intercourse 54 yo-Fewer than 5 partners  Other Topics Concern  . Not on file  Social History Narrative  . Not on file   Social Determinants of Health   Financial Resource Strain: Not on file  Food Insecurity: Not on file  Transportation Needs: Not on file  Physical Activity: Not on file  Stress: Not on file  Social Connections: Not on file  Intimate Partner Violence: Not on file      Review of Systems  All other systems reviewed and are negative.      Objective:   Physical Exam Vitals reviewed.  Constitutional:      General: She is not in acute distress.     Appearance: Normal appearance. She is normal weight. She is not ill-appearing, toxic-appearing or diaphoretic.  HENT:     Head: Normocephalic and atraumatic.     Right Ear: Tympanic membrane, ear canal and external ear normal. There is no impacted cerumen.     Left Ear: Tympanic membrane, ear canal and external ear normal. There is no impacted cerumen.     Nose: Congestion and rhinorrhea present.     Right Sinus: Maxillary sinus tenderness and frontal sinus tenderness present.  Eyes:     Conjunctiva/sclera: Conjunctivae normal.  Cardiovascular:     Rate and Rhythm: Normal rate and regular rhythm.     Pulses: Normal pulses.     Heart sounds: Normal heart sounds. No murmur heard. No friction rub. No gallop.   Pulmonary:     Effort: Pulmonary effort is normal. No respiratory distress.     Breath sounds: Normal breath sounds. No stridor. No wheezing, rhonchi or rales.  Chest:     Chest wall: No tenderness.  Musculoskeletal:     Cervical back: Normal range of motion.  Lymphadenopathy:     Cervical: No cervical adenopathy.  Neurological:     Mental Status: She is alert.           Assessment & Plan:  Acute recurrent maxillary sinusitis  Patient has not had symptoms now for more than 4 weeks and has failed 10 days of an antibiotic and steroids.  I will switch to Levaquin 750 mg daily for 7 days.  Use Flonase 2 sprays each nostril daily.  Use nasal saline 3 times daily.  Obtain CT scan of the sinuses if not better and consult ENT if not improving as she may require surgical drainage.

## 2020-01-25 ENCOUNTER — Ambulatory Visit
Admission: RE | Admit: 2020-01-25 | Discharge: 2020-01-25 | Disposition: A | Discharge status: Home / Self Care | Payer: BC Managed Care – PPO | Source: Ambulatory Visit | Attending: Family Medicine | Admitting: Family Medicine

## 2020-01-25 DIAGNOSIS — J0101 Acute recurrent maxillary sinusitis: Secondary | ICD-10-CM

## 2020-01-26 ENCOUNTER — Other Ambulatory Visit: Payer: Self-pay | Admitting: *Deleted

## 2020-01-26 ENCOUNTER — Ambulatory Visit: Payer: BC Managed Care – PPO | Admitting: Family Medicine

## 2020-01-26 DIAGNOSIS — J0100 Acute maxillary sinusitis, unspecified: Secondary | ICD-10-CM

## 2020-01-26 DIAGNOSIS — J0101 Acute recurrent maxillary sinusitis: Secondary | ICD-10-CM

## 2020-02-13 ENCOUNTER — Other Ambulatory Visit: Payer: Self-pay

## 2020-02-13 ENCOUNTER — Ambulatory Visit (INDEPENDENT_AMBULATORY_CARE_PROVIDER_SITE_OTHER): Payer: Self-pay | Admitting: Otolaryngology

## 2020-02-13 ENCOUNTER — Ambulatory Visit (INDEPENDENT_AMBULATORY_CARE_PROVIDER_SITE_OTHER): Payer: BC Managed Care – PPO | Admitting: Otolaryngology

## 2020-02-13 ENCOUNTER — Encounter (INDEPENDENT_AMBULATORY_CARE_PROVIDER_SITE_OTHER): Payer: Self-pay | Admitting: Otolaryngology

## 2020-02-13 VITALS — Temp 97.3°F

## 2020-02-13 DIAGNOSIS — J320 Chronic maxillary sinusitis: Secondary | ICD-10-CM

## 2020-02-13 DIAGNOSIS — J322 Chronic ethmoidal sinusitis: Secondary | ICD-10-CM | POA: Diagnosis not present

## 2020-02-13 NOTE — Progress Notes (Signed)
HPI: Rachel [name] is a 55 y.o. female who presents is referred by her PCP for evaluation of chronic right-sided sinusitis.  She has had a long history of sinus problems and has had previous sinus balloon plasty.  More recently in November she developed an upper respiratory infection and chronic right-sided facial pain with mucopurulent discharge and odor.  She was treated with Augmentin and steroids but still had pain and underwent a CT scan that showed opacification of the right ethmoid and right maxillary sinuses..  There was also a mass noted within the area of the right [facility] and right middle turbinate contributing to the obstruction.  I reviewed the CT scan with the patient in the office today. She is otherwise healthy.  She does have history of breast cancer. She works as a Runner, broadcasting/film/video for fifth grade.  Past Medical History:  Diagnosis Date  . ASCUS (atypical squamous cells of undetermined significance) on Pap smear 02/2006, 05/2006   NEG HPV ----- PAP 12/2007 WNL  . ASCUS of cervix with negative high risk HPV 03/2018  . 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 Chiari malformation type I ([facility])   . Chronic headache   . Family history of uterine cancer   . History of breast cancer 2000  . Hypercholesteremia    diet controlled  . Hypothyroidism    on meds  . Osteoporosis 09/2018   T score -2.4.  Prior T score -2.5.  DEXA stable from prior study.  . Personal history of chemotherapy 2001  . Personal history of radiation therapy 2001   Past Surgical History:  Procedure Laterality Date  . BREAST BIOPSY Left   . BREAST EXCISIONAL BIOPSY Left 2021  . BREAST EXCISIONAL BIOPSY Right   . BREAST LUMPECTOMY Right 2001  . CESAREAN SECTION    . COLPOSCOPY    . PELVIC LAPAROSCOPY    . TONSILLECTOMY    . TONSILLECTOMY  1980  . WISDOM TOOTH EXTRACTION  1986   Social History   Socioeconomic History  . Marital status: Married    Spouse name: Not on  file  . Number of children: Not on file  . Years of education: Not on file  . Highest education level: Not on file  Occupational History  . Not on file  Tobacco Use  . Smoking status: Never Smoker  . Smokeless tobacco: Never Used  . Tobacco comment: never used tobacco  Vaping Use  . Vaping Use: Never used  Substance and Sexual Activity  . Alcohol use: Not Currently    Alcohol/week: 0.0 standard drinks  . Drug use: No  . Sexual activity: Yes    Birth control/protection: Post-menopausal    Comment: 1st intercourse 55 yo-Fewer than 5 partners  Other Topics Concern  . Not on file  Social History Narrative  . Not on file   Social Determinants of Health   Financial Resource Strain: Not on file  Food Insecurity: Not on file  Transportation Needs: Not on file  Physical Activity: Not on file  Stress: Not on file  Social Connections: Not on file   Family History  Problem Relation Age of Onset  . Diabetes Father   . Uterine cancer Maternal Aunt 60  . Colon polyps Neg Hx   . Colon cancer Neg Hx   . Esophageal cancer Neg Hx   . Stomach cancer Neg Hx   . Rectal cancer Neg Hx    Allergies  Allergen Reactions  . Erythromycin Nausea And Vomiting   Prior to Admission medications   Medication Sig Start Date End Date Taking? Authorizing Provider  butalbital-acetaminophen-caffeine (FIORICET, ESGIC) 50-325-40 MG per tablet TAKE 1 TABLET BY MOUTH EVERY 6 HOURS AS NEEDED FOR HEADACHE 05/19/14   Susy Frizzle, MD  Cholecalciferol (VITAMIN D PO) Take 2,000 Units by mouth.    [provider]  fluticasone (FLONASE) 50 MCG/ACT nasal spray INHALE 2 SPRASY IN EACH NOSTRIL ONCE DAILY    Pickard, Cammie Mcgee, MD  levofloxacin (LEVAQUIN) 750 MG tablet Take 1 tablet (750 mg total) by mouth daily. 01/24/20   Susy Frizzle, MD  neomycin-polymyxin b-dexamethasone (MAXITROL) 3.5-10000-0.1 SUSP SMARTSIG:In Eye(s) 10/19/19   [provider]  promethazine (PHENERGAN) 25 MG tablet TAKE  1 TABLET BY MOUTH EVERY 8 HOURS AS NEEDED FOR NAUSEA/VOMITING 05/19/14   Susy Frizzle, MD  rizatriptan (MAXALT) 10 MG tablet TAKE 1 TABLET BY MOUTH EVERY DAY AS NEEDED FOR MIGRANE 10/12/18   Susy Frizzle, MD  UNITHROID 100 MCG tablet TAKE 1 TABLET BY MOUTH EVERY DAY BEFORE BREAKFAST 08/08/19   Elayne Snare, MD     Positive ROS: Otherwise negative  All other systems have been reviewed and were otherwise negative with the exception of those mentioned in the HPI and as above.  Physical Exam: Constitutional: Alert, well-appearing, no acute distress Ears: External ears without lesions or tenderness. Ear canals are clear bilaterally with intact, clear TMs.  Nasal: External nose without lesions. Septum slightly deviated to the right..  She has mucopurulent discharge from the right middle meatus with a clear left middle meatus.  No obvious intranasal mass noted. Oral: Lips and gums without lesions. Tongue and palate mucosa without lesions. Posterior oropharynx clear. Neck: No palpable adenopathy or masses Respiratory: Breathing comfortably Cardiac exam: Regular rate and rhythm without murmur Skin: No facial/neck lesions or rash noted.  Procedures  Assessment: Chronic right maxillary and ethmoid sinus disease with questionable right middle meatus mass noted on CT scan.  Plan: Discussed with her concerning endoscopic sinus surgery with removal of questionable mass and cleaning the right ethmoid right maxillary sinuses.  Of note the right frontal and sphenoid sinuses were clear. Hopefully we will be able to schedule this within the next week or 2.   Radene Journey, MD   CC:

## 2020-02-13 NOTE — H&P (View-Only) (Signed)
HPI: Rachel [name] is a 55 y.o. female who presents is referred by her PCP for evaluation of chronic right-sided sinusitis.  She has had a long history of sinus problems and has had previous sinus balloon plasty.  More recently in November she developed an upper respiratory infection and chronic right-sided facial pain with mucopurulent discharge and odor.  She was treated with Augmentin and steroids but still had pain and underwent a CT scan that showed opacification of the right ethmoid and right maxillary sinuses..  There was also a mass noted within the area of the right [facility] and right middle turbinate contributing to the obstruction.  I reviewed the CT scan with the patient in the office today. She is otherwise healthy.  She does have history of breast cancer. She works as a Runner, broadcasting/film/video for fifth grade.  Past Medical History:  Diagnosis Date  . ASCUS (atypical squamous cells of undetermined significance) on Pap smear 02/2006, 05/2006   NEG HPV ----- PAP 12/2007 WNL  . ASCUS of cervix with negative high risk HPV 03/2018  . 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 Chiari malformation type I ([facility])   . Chronic headache   . Family history of uterine cancer   . History of breast cancer 2000  . Hypercholesteremia    diet controlled  . Hypothyroidism    on meds  . Osteoporosis 09/2018   T score -2.4.  Prior T score -2.5.  DEXA stable from prior study.  . Personal history of chemotherapy 2001  . Personal history of radiation therapy 2001   Past Surgical History:  Procedure Laterality Date  . BREAST BIOPSY Left   . BREAST EXCISIONAL BIOPSY Left 2021  . BREAST EXCISIONAL BIOPSY Right   . BREAST LUMPECTOMY Right 2001  . CESAREAN SECTION    . COLPOSCOPY    . PELVIC LAPAROSCOPY    . TONSILLECTOMY    . TONSILLECTOMY  1980  . WISDOM TOOTH EXTRACTION  1986   Social History   Socioeconomic History  . Marital status: Married    Spouse name: Not on  file  . Number of children: Not on file  . Years of education: Not on file  . Highest education level: Not on file  Occupational History  . Not on file  Tobacco Use  . Smoking status: Never Smoker  . Smokeless tobacco: Never Used  . Tobacco comment: never used tobacco  Vaping Use  . Vaping Use: Never used  Substance and Sexual Activity  . Alcohol use: Not Currently    Alcohol/week: 0.0 standard drinks  . Drug use: No  . Sexual activity: Yes    Birth control/protection: Post-menopausal    Comment: 1st intercourse 55 yo-Fewer than 5 partners  Other Topics Concern  . Not on file  Social History Narrative  . Not on file   Social Determinants of Health   Financial Resource Strain: Not on file  Food Insecurity: Not on file  Transportation Needs: Not on file  Physical Activity: Not on file  Stress: Not on file  Social Connections: Not on file   Family History  Problem Relation Age of Onset  . Diabetes Father   . Uterine cancer Maternal Aunt 60  . Colon polyps Neg Hx   . Colon cancer Neg Hx   . Esophageal cancer Neg Hx   . Stomach cancer Neg Hx   . Rectal cancer Neg Hx    Allergies  Allergen Reactions  . Erythromycin Nausea And Vomiting   Prior to Admission medications   Medication Sig Start Date End Date Taking? Authorizing Provider  butalbital-acetaminophen-caffeine (FIORICET, ESGIC) 50-325-40 MG per tablet TAKE 1 TABLET BY MOUTH EVERY 6 HOURS AS NEEDED FOR HEADACHE 05/19/14   Susy Frizzle, MD  Cholecalciferol (VITAMIN D PO) Take 2,000 Units by mouth.    [provider]  fluticasone (FLONASE) 50 MCG/ACT nasal spray INHALE 2 SPRASY IN EACH NOSTRIL ONCE DAILY    Pickard, Cammie Mcgee, MD  levofloxacin (LEVAQUIN) 750 MG tablet Take 1 tablet (750 mg total) by mouth daily. 01/24/20   Susy Frizzle, MD  neomycin-polymyxin b-dexamethasone (MAXITROL) 3.5-10000-0.1 SUSP SMARTSIG:In Eye(s) 10/19/19   [provider]  promethazine (PHENERGAN) 25 MG tablet TAKE  1 TABLET BY MOUTH EVERY 8 HOURS AS NEEDED FOR NAUSEA/VOMITING 05/19/14   Susy Frizzle, MD  rizatriptan (MAXALT) 10 MG tablet TAKE 1 TABLET BY MOUTH EVERY DAY AS NEEDED FOR MIGRANE 10/12/18   Susy Frizzle, MD  UNITHROID 100 MCG tablet TAKE 1 TABLET BY MOUTH EVERY DAY BEFORE BREAKFAST 08/08/19   Elayne Snare, MD     Positive ROS: Otherwise negative  All other systems have been reviewed and were otherwise negative with the exception of those mentioned in the HPI and as above.  Physical Exam: Constitutional: Alert, well-appearing, no acute distress Ears: External ears without lesions or tenderness. Ear canals are clear bilaterally with intact, clear TMs.  Nasal: External nose without lesions. Septum slightly deviated to the right..  She has mucopurulent discharge from the right middle meatus with a clear left middle meatus.  No obvious intranasal mass noted. Oral: Lips and gums without lesions. Tongue and palate mucosa without lesions. Posterior oropharynx clear. Neck: No palpable adenopathy or masses Respiratory: Breathing comfortably Cardiac exam: Regular rate and rhythm without murmur Skin: No facial/neck lesions or rash noted.  Procedures  Assessment: Chronic right maxillary and ethmoid sinus disease with questionable right middle meatus mass noted on CT scan.  Plan: Discussed with her concerning endoscopic sinus surgery with removal of questionable mass and cleaning the right ethmoid right maxillary sinuses.  Of note the right frontal and sphenoid sinuses were clear. Hopefully we will be able to schedule this within the next week or 2.   Radene Journey, MD   CC:

## 2020-02-14 ENCOUNTER — Encounter (HOSPITAL_BASED_OUTPATIENT_CLINIC_OR_DEPARTMENT_OTHER): Payer: Self-pay | Admitting: Otolaryngology

## 2020-02-14 ENCOUNTER — Other Ambulatory Visit: Payer: BC Managed Care – PPO

## 2020-02-14 ENCOUNTER — Other Ambulatory Visit: Payer: Self-pay

## 2020-02-16 ENCOUNTER — Other Ambulatory Visit (HOSPITAL_COMMUNITY): Payer: BC Managed Care – PPO

## 2020-02-17 ENCOUNTER — Other Ambulatory Visit (HOSPITAL_COMMUNITY)
Admission: RE | Admit: 2020-02-17 | Discharge: 2020-02-17 | Disposition: A | Discharge status: Home / Self Care | Payer: BC Managed Care – PPO | Source: Ambulatory Visit | Attending: Cardiology | Admitting: Cardiology

## 2020-02-17 ENCOUNTER — Ambulatory Visit (INDEPENDENT_AMBULATORY_CARE_PROVIDER_SITE_OTHER): Payer: Self-pay | Admitting: Otolaryngology

## 2020-02-17 DIAGNOSIS — Z9221 Personal history of antineoplastic chemotherapy: Secondary | ICD-10-CM | POA: Diagnosis not present

## 2020-02-17 DIAGNOSIS — J3489 Other specified disorders of nose and nasal sinuses: Secondary | ICD-10-CM | POA: Diagnosis not present

## 2020-02-17 DIAGNOSIS — Z853 Personal history of malignant neoplasm of breast: Secondary | ICD-10-CM | POA: Diagnosis not present

## 2020-02-17 DIAGNOSIS — Z923 Personal history of irradiation: Secondary | ICD-10-CM | POA: Diagnosis not present

## 2020-02-17 DIAGNOSIS — Z881 Allergy status to other antibiotic agents status: Secondary | ICD-10-CM | POA: Diagnosis not present

## 2020-02-17 DIAGNOSIS — Z01812 Encounter for preprocedural laboratory examination: Secondary | ICD-10-CM | POA: Insufficient documentation

## 2020-02-17 DIAGNOSIS — Z20822 Contact with and (suspected) exposure to covid-19: Secondary | ICD-10-CM | POA: Insufficient documentation

## 2020-02-17 DIAGNOSIS — Z8049 Family history of malignant neoplasm of other genital organs: Secondary | ICD-10-CM | POA: Diagnosis not present

## 2020-02-17 LAB — SARS CORONAVIRUS 2 (TAT 6-24 HRS): SARS Coronavirus 2: NEGATIVE

## 2020-02-20 ENCOUNTER — Other Ambulatory Visit: Payer: Self-pay

## 2020-02-20 ENCOUNTER — Ambulatory Visit (HOSPITAL_BASED_OUTPATIENT_CLINIC_OR_DEPARTMENT_OTHER): Payer: BC Managed Care – PPO | Admitting: Anesthesiology

## 2020-02-20 ENCOUNTER — Ambulatory Visit (HOSPITAL_BASED_OUTPATIENT_CLINIC_OR_DEPARTMENT_OTHER)
Admission: RE | Admit: 2020-02-20 | Discharge: 2020-02-20 | Disposition: A | Discharge status: Home / Self Care | Payer: BC Managed Care – PPO | Attending: Otolaryngology | Admitting: Otolaryngology

## 2020-02-20 ENCOUNTER — Encounter (HOSPITAL_BASED_OUTPATIENT_CLINIC_OR_DEPARTMENT_OTHER): Payer: Self-pay | Admitting: Otolaryngology

## 2020-02-20 ENCOUNTER — Encounter (HOSPITAL_BASED_OUTPATIENT_CLINIC_OR_DEPARTMENT_OTHER)
Admission: RE | Disposition: A | Discharge status: Home / Self Care | Payer: Self-pay | Source: Home / Self Care | Attending: Otolaryngology

## 2020-02-20 DIAGNOSIS — Z20822 Contact with and (suspected) exposure to covid-19: Secondary | ICD-10-CM | POA: Insufficient documentation

## 2020-02-20 DIAGNOSIS — Z881 Allergy status to other antibiotic agents status: Secondary | ICD-10-CM | POA: Insufficient documentation

## 2020-02-20 DIAGNOSIS — J322 Chronic ethmoidal sinusitis: Secondary | ICD-10-CM | POA: Diagnosis not present

## 2020-02-20 DIAGNOSIS — J3489 Other specified disorders of nose and nasal sinuses: Secondary | ICD-10-CM | POA: Insufficient documentation

## 2020-02-20 DIAGNOSIS — Z923 Personal history of irradiation: Secondary | ICD-10-CM | POA: Insufficient documentation

## 2020-02-20 DIAGNOSIS — J320 Chronic maxillary sinusitis: Secondary | ICD-10-CM

## 2020-02-20 DIAGNOSIS — Z8049 Family history of malignant neoplasm of other genital organs: Secondary | ICD-10-CM | POA: Insufficient documentation

## 2020-02-20 DIAGNOSIS — Z9221 Personal history of antineoplastic chemotherapy: Secondary | ICD-10-CM | POA: Insufficient documentation

## 2020-02-20 DIAGNOSIS — Z853 Personal history of malignant neoplasm of breast: Secondary | ICD-10-CM | POA: Insufficient documentation

## 2020-02-20 HISTORY — PX: SINUS ENDO WITH FUSION: SHX5329

## 2020-02-20 HISTORY — PX: NASAL SINUS SURGERY: SHX719

## 2020-02-20 HISTORY — DX: Other complications of anesthesia, initial encounter: T88.59XA

## 2020-02-20 HISTORY — DX: Other specified postprocedural states: Z98.890

## 2020-02-20 HISTORY — DX: Nausea with vomiting, unspecified: R11.2

## 2020-02-20 SURGERY — SINUS SURGERY, ENDOSCOPIC
Anesthesia: General | Site: Nose | Laterality: Right

## 2020-02-20 MED ORDER — OXYMETAZOLINE HCL 0.05 % NA SOLN
NASAL | Status: AC
  Filled 2020-02-20: qty 30

## 2020-02-20 MED ORDER — CEFAZOLIN SODIUM-DEXTROSE 2-4 GM/100ML-% IV SOLN
2.0000 g | INTRAVENOUS | Status: AC
  Administered 2020-02-20: 2 g via INTRAVENOUS

## 2020-02-20 MED ORDER — OXYMETAZOLINE HCL 0.05 % NA SOLN
NASAL | Status: DC | PRN
  Administered 2020-02-20: 1 via TOPICAL

## 2020-02-20 MED ORDER — CEFAZOLIN SODIUM-DEXTROSE 2-4 GM/100ML-% IV SOLN
INTRAVENOUS | Status: AC
  Filled 2020-02-20: qty 100

## 2020-02-20 MED ORDER — PROPOFOL 10 MG/ML IV BOLUS
INTRAVENOUS | Status: DC | PRN
  Administered 2020-02-20: 140 mg via INTRAVENOUS

## 2020-02-20 MED ORDER — ROCURONIUM BROMIDE 100 MG/10ML IV SOLN
INTRAVENOUS | Status: DC | PRN
  Administered 2020-02-20: 70 mg via INTRAVENOUS

## 2020-02-20 MED ORDER — CHLORHEXIDINE GLUCONATE CLOTH 2 % EX PADS: 6.0000 | MEDICATED_PAD | Freq: Once | CUTANEOUS | Status: DC

## 2020-02-20 MED ORDER — MUPIROCIN 2 % EX OINT
TOPICAL_OINTMENT | CUTANEOUS | Status: DC | PRN
  Administered 2020-02-20: 1 via NASAL

## 2020-02-20 MED ORDER — FENTANYL CITRATE (PF) 100 MCG/2ML IJ SOLN
INTRAMUSCULAR | Status: DC | PRN
  Administered 2020-02-20 (×4): 50 ug via INTRAVENOUS

## 2020-02-20 MED ORDER — PROMETHAZINE HCL 25 MG PO TABS: ORAL_TABLET | ORAL | 0 refills | Status: DC

## 2020-02-20 MED ORDER — OXYCODONE HCL 5 MG/5ML PO SOLN: 5.0000 mg | Freq: Once | ORAL | Status: AC | PRN

## 2020-02-20 MED ORDER — LIDOCAINE-EPINEPHRINE 1 %-1:100000 IJ SOLN
INTRAMUSCULAR | Status: DC | PRN
  Administered 2020-02-20: 8.5 mL

## 2020-02-20 MED ORDER — OXYCODONE HCL 5 MG PO TABS
5.0000 mg | ORAL_TABLET | Freq: Once | ORAL | Status: AC | PRN
  Administered 2020-02-20: 5 mg via ORAL

## 2020-02-20 MED ORDER — MIDAZOLAM HCL 5 MG/5ML IJ SOLN
INTRAMUSCULAR | Status: DC | PRN
  Administered 2020-02-20: 2 mg via INTRAVENOUS

## 2020-02-20 MED ORDER — ONDANSETRON HCL 4 MG/2ML IJ SOLN
INTRAMUSCULAR | Status: DC | PRN
  Administered 2020-02-20: 4 mg via INTRAVENOUS

## 2020-02-20 MED ORDER — CEPHALEXIN 500 MG PO CAPS: 500.0000 mg | ORAL_CAPSULE | Freq: Two times a day (BID) | ORAL | 0 refills | Status: DC

## 2020-02-20 MED ORDER — LIDOCAINE HCL (CARDIAC) PF 100 MG/5ML IV SOSY
PREFILLED_SYRINGE | INTRAVENOUS | Status: DC | PRN
  Administered 2020-02-20: 80 mg via INTRAVENOUS

## 2020-02-20 MED ORDER — MUPIROCIN 2 % EX OINT
TOPICAL_OINTMENT | CUTANEOUS | Status: AC
  Filled 2020-02-20: qty 22

## 2020-02-20 MED ORDER — FENTANYL CITRATE (PF) 100 MCG/2ML IJ SOLN
INTRAMUSCULAR | Status: AC
  Filled 2020-02-20: qty 2

## 2020-02-20 MED ORDER — DEXAMETHASONE SODIUM PHOSPHATE 4 MG/ML IJ SOLN
INTRAMUSCULAR | Status: DC | PRN
  Administered 2020-02-20: 10 mg via INTRAVENOUS

## 2020-02-20 MED ORDER — DROPERIDOL 2.5 MG/ML IJ SOLN
INTRAMUSCULAR | Status: AC
  Filled 2020-02-20: qty 2

## 2020-02-20 MED ORDER — ONDANSETRON HCL 4 MG/2ML IJ SOLN
INTRAMUSCULAR | Status: AC
  Filled 2020-02-20: qty 2

## 2020-02-20 MED ORDER — LIDOCAINE-EPINEPHRINE 1 %-1:100000 IJ SOLN
INTRAMUSCULAR | Status: AC
  Filled 2020-02-20: qty 1

## 2020-02-20 MED ORDER — MEPERIDINE HCL 25 MG/ML IJ SOLN: 6.2500 mg | INTRAMUSCULAR | Status: DC | PRN

## 2020-02-20 MED ORDER — PROPOFOL 10 MG/ML IV BOLUS
INTRAVENOUS | Status: AC
  Filled 2020-02-20: qty 20

## 2020-02-20 MED ORDER — SUGAMMADEX SODIUM 200 MG/2ML IV SOLN
INTRAVENOUS | Status: DC | PRN
  Administered 2020-02-20: 200 mg via INTRAVENOUS

## 2020-02-20 MED ORDER — MIDAZOLAM HCL 2 MG/2ML IJ SOLN
INTRAMUSCULAR | Status: AC
  Filled 2020-02-20: qty 2

## 2020-02-20 MED ORDER — LIDOCAINE 2% (20 MG/ML) 5 ML SYRINGE
INTRAMUSCULAR | Status: AC
  Filled 2020-02-20: qty 5

## 2020-02-20 MED ORDER — DEXAMETHASONE SODIUM PHOSPHATE 10 MG/ML IJ SOLN
INTRAMUSCULAR | Status: AC
  Filled 2020-02-20: qty 1

## 2020-02-20 MED ORDER — HYDROMORPHONE HCL 1 MG/ML IJ SOLN
0.2500 mg | INTRAMUSCULAR | Status: DC | PRN
  Administered 2020-02-20 (×2): 0.25 mg via INTRAVENOUS

## 2020-02-20 MED ORDER — LACTATED RINGERS IV SOLN: INTRAVENOUS | Status: DC

## 2020-02-20 MED ORDER — SODIUM CHLORIDE 0.9 % IV SOLN
INTRAVENOUS | Status: AC | PRN
  Administered 2020-02-20: 100 mL

## 2020-02-20 MED ORDER — HYDROMORPHONE HCL 1 MG/ML IJ SOLN
INTRAMUSCULAR | Status: AC
  Filled 2020-02-20: qty 0.5

## 2020-02-20 MED ORDER — DROPERIDOL 2.5 MG/ML IJ SOLN
INTRAMUSCULAR | Status: DC | PRN
  Administered 2020-02-20: .625 mg via INTRAVENOUS

## 2020-02-20 MED ORDER — OXYCODONE HCL 5 MG PO TABS
ORAL_TABLET | ORAL | Status: AC
  Filled 2020-02-20: qty 1

## 2020-02-20 MED ORDER — PROMETHAZINE HCL 25 MG/ML IJ SOLN: 6.2500 mg | INTRAMUSCULAR | Status: DC | PRN

## 2020-02-20 SURGICAL SUPPLY — 64 items
ATTRACTOMAT 16X20 MAGNETIC DRP (DRAPES) ×2 IMPLANT
BLADE INF TURB ROT M4 2 5PK (BLADE) IMPLANT
BLADE RAD40 ROTATE 4M 4 5PK (BLADE) IMPLANT
BLADE RAD60 ROTATE M4 4 5PK (BLADE) IMPLANT
BLADE ROTATE RAD 12 4 M4 (BLADE) IMPLANT
BLADE ROTATE RAD 40 4 M4 (BLADE) IMPLANT
BLADE ROTATE TRICUT 4X13 M4 (BLADE) ×2 IMPLANT
BLADE TRICUT ROTATE M4 4 5PK (BLADE) IMPLANT
BUR HS RAD FRONTAL 3 (BURR) IMPLANT
CANISTER SUC SOCK COL 7IN (MISCELLANEOUS) ×2 IMPLANT
CANISTER SUCT 1200ML W/VALVE (MISCELLANEOUS) ×4 IMPLANT
CLEANER CAUTERY TIP 5X5 PAD (MISCELLANEOUS) IMPLANT
COAGULATOR SUCT 8FR VV (MISCELLANEOUS) ×2 IMPLANT
COVER PROBE W GEL 5X96 (DRAPES) IMPLANT
COVER WAND RF STERILE (DRAPES) IMPLANT
DECANTER SPIKE VIAL GLASS SM (MISCELLANEOUS) ×2 IMPLANT
DRAPE SURG 17X23 STRL (DRAPES) ×2 IMPLANT
DRESSING NASAL KENNEDY 3.5X.9 (MISCELLANEOUS) IMPLANT
DRSG NASAL KENNEDY 3.5X.9 (MISCELLANEOUS)
DRSG NASAL KENNEDY LMNT 8CM (GAUZE/BANDAGES/DRESSINGS) IMPLANT
DRSG NASOPORE 8CM (GAUZE/BANDAGES/DRESSINGS) ×2 IMPLANT
DRSG TELFA 3X8 NADH (GAUZE/BANDAGES/DRESSINGS) IMPLANT
ELECT COATED BLADE 2.86 ST (ELECTRODE) IMPLANT
ELECT NEEDLE BLADE 2-5/6 (NEEDLE) IMPLANT
ELECT REM PT RETURN 9FT ADLT (ELECTROSURGICAL) ×2
ELECTRODE REM PT RTRN 9FT ADLT (ELECTROSURGICAL) ×1 IMPLANT
GLOVE SS BIOGEL STRL SZ 7.5 (GLOVE) ×1 IMPLANT
GLOVE SUPERSENSE BIOGEL SZ 7.5 (GLOVE) ×1
GLOVE SURG SS PI 7.0 STRL IVOR (GLOVE) ×2 IMPLANT
GLOVE SURG UNDER POLY LF SZ7 (GLOVE) ×6 IMPLANT
GOWN STRL REUS W/ TWL LRG LVL3 (GOWN DISPOSABLE) ×1 IMPLANT
GOWN STRL REUS W/ TWL XL LVL3 (GOWN DISPOSABLE) ×1 IMPLANT
GOWN STRL REUS W/TWL LRG LVL3 (GOWN DISPOSABLE) ×2
GOWN STRL REUS W/TWL XL LVL3 (GOWN DISPOSABLE) ×2
HEMOSTAT SURGICEL .5X2 ABSORB (HEMOSTASIS) IMPLANT
HEMOSTAT SURGICEL 2X14 (HEMOSTASIS) IMPLANT
IV NS 1000ML (IV SOLUTION)
IV NS 1000ML BAXH (IV SOLUTION) IMPLANT
IV NS 500ML (IV SOLUTION)
IV NS 500ML BAXH (IV SOLUTION) IMPLANT
NEEDLE PRECISIONGLIDE 27X1.5 (NEEDLE) ×2 IMPLANT
NEEDLE SPNL 25GX3.5 QUINCKE BL (NEEDLE) IMPLANT
NS IRRIG 1000ML POUR BTL (IV SOLUTION) ×2 IMPLANT
PACK BASIN DAY SURGERY FS (CUSTOM PROCEDURE TRAY) ×2 IMPLANT
PACK ENT DAY SURGERY (CUSTOM PROCEDURE TRAY) ×2 IMPLANT
PAD CLEANER CAUTERY TIP 5X5 (MISCELLANEOUS)
PATTIES SURGICAL .5 X3 (DISPOSABLE) ×2 IMPLANT
PENCIL SMOKE EVACUATOR (MISCELLANEOUS) IMPLANT
SLEEVE SCD COMPRESS KNEE MED (MISCELLANEOUS) ×2 IMPLANT
SOL ANTI FOG 6CC (MISCELLANEOUS) ×1 IMPLANT
SOLUTION ANTI FOG 6CC (MISCELLANEOUS) ×1
SPONGE GAUZE 2X2 8PLY STRL LF (GAUZE/BANDAGES/DRESSINGS) ×2 IMPLANT
SUT CHROMIC 4 0 PS 2 18 (SUTURE) IMPLANT
SUT SILK 2 0 PERMA HAND 18 BK (SUTURE) IMPLANT
SWAB COLLECTION DEVICE MRSA (MISCELLANEOUS) ×2 IMPLANT
SWAB CULTURE ESWAB REG 1ML (MISCELLANEOUS) ×2 IMPLANT
SYR 3ML 18GX1 1/2 (SYRINGE) IMPLANT
TOWEL GREEN STERILE FF (TOWEL DISPOSABLE) ×4 IMPLANT
TRACKER ENT INSTRUMENT (MISCELLANEOUS) ×2 IMPLANT
TRACKER ENT PATIENT (MISCELLANEOUS) ×2 IMPLANT
TRAY DSU PREP LF (CUSTOM PROCEDURE TRAY) ×2 IMPLANT
TUBE CONNECTING 20X1/4 (TUBING) ×2 IMPLANT
TUBING STRAIGHTSHOT EPS 5PK (TUBING) ×2 IMPLANT
YANKAUER SUCT BULB TIP NO VENT (SUCTIONS) ×2 IMPLANT

## 2020-02-20 NOTE — Op Note (Signed)
NAME: Rachel [name], FATE MEDICAL RECORD [id number] ACCOUNT [id number] DATE OF BIRTH:[birth year] FACILITY: [facility] LOCATION: [facility] PHYSICIAN:Cyncere Sontag Lincoln Maxin, MD  OPERATIVE REPORT  DATE OF PROCEDURE:  02/20/2020  PREOPERATIVE DIAGNOSIS:  Chronic right maxillary, right ethmoid sinus disease.  POSTOPERATIVE DIAGNOSIS:  Chronic right maxillary, right ethmoid sinus disease.  OPERATION PERFORMED:  Functional endoscopic sinus surgery with right ethmoidectomy and right maxillary ostial enlargement using fusion protocol.  SURGEON:  [name], MD.  ANESTHESIA:  General endotracheal.  ESTIMATED BLOOD LOSS:  50-60 mL.  COMPLICATIONS:  None.  BRIEF CLINICAL NOTE:  The patient is a 55 year old female who has had a long history of recurrent sinus problems.  She has had previous sinus balloon plasty performed several years ago.  More recently, she has been having chronic right maxillary sinus  infection with pain and discomfort and purulent discharge for the past 2 months.  She was seen by her medical doctor and was treated initially with Augmentin and steroids.  She continued to have problems and subsequently was treated with a course of  Levaquin 750 mg daily for 1 week.  She continued to have problems and had a CT scan, which showed complete opacification of the right maxillary and partial opacification of right ethmoid area.  The remaining sinuses on the left side were clear.  Because  of persistent sinus disease despite a couple of rounds of antibiotics and persistent pain and discomfort she is taken to the operating room at this time for endoscopic sinus surgery.  On the CT scan, there was a question about a polyp or mass within the  [facility], obstructing the [facility] and extending down into the right inferior turbinate.  On exam in the office, on endoscopic exam she has purulent discharge, but no obvious intranasal mass noted.  DESCRIPTION OF PROCEDURE:  After adequate endotracheal  anesthesia, the nose was prepped with cotton pledget soaked in Afrin for decongesting the nose.  Then, the middle meatus area was injected with Xylocaine with epinephrine for hemostasis.  The right  middle meatus was very edematous with a small amount of purulent discharge.  The middle turbinate was displaced medially and the uncinate process was identified and incised with a sickle knife.  The maxillary sinus was opened and purulent discharge was  expressed and cultures were obtained.  The maxillary ostium was enlarged with a straight through cup and back cutting forceps to approximate 1 cm size.  Using the microdebrider, the anterior portion of the posterior ethmoid areas were opened up that were  very edematous and opacified on the CT scan.  This basically completed the procedure.  Hemostasis was obtained with suction cautery.  The sinus was irrigated with saline and then a single NasoPore pack soaked in mupirocin ointment was placed within the  right middle meatus.  The patient was awoken from anesthesia and transferred to the recovery room and postoperatively doing well.  DISPOSITION:  She is discharged home later this afternoon on Keflex 500 mg b.i.d. for 10 days.  We will have her follow up in my office in 4 days for recheck.  HN/NUANCE  D:02/20/2020 T:02/20/2020 JOB:[id number]

## 2020-02-20 NOTE — Transfer of Care (Signed)
Immediate Anesthesia Transfer of Care Note  Patient: Rachel [name] [facility]  Procedure(s) Performed: ENDOSCOPIC SINUS SURGERY WITH RIGHT ETHMOIDECTOMY AND RIGHT MAXILLARY OSTIA ENLARGEMENT WITH FUSION (Right Nose) SINUS ENDO WITH FUSION (Right Nose)  Patient Location: PACU  Anesthesia Type:General  Level of Consciousness: sedated  Airway & Oxygen Therapy: Patient Spontanous Breathing and Patient connected to face mask oxygen  Post-op Assessment: Report given to RN and Post -op Vital signs reviewed and stable  Post vital signs: Reviewed and stable  Last Vitals:  Vitals Value Taken Time  BP 120/61 02/20/20 1251  Temp    Pulse 75 02/20/20 1253  Resp 8 02/20/20 1253  SpO2 100 % 02/20/20 1253  Vitals shown include unvalidated device data.  Last Pain:  Vitals:   02/20/20 0958  TempSrc: Oral  PainSc: 0-No pain         Complications: No complications documented.

## 2020-02-20 NOTE — Anesthesia Preprocedure Evaluation (Signed)
Anesthesia Evaluation  Patient identified by MRN, date of birth, ID band Patient awake    Reviewed: Allergy & Precautions, H&P , NPO status , Patient's Chart, lab work & pertinent test results  History of Anesthesia Complications (+) PONV  Airway Mallampati: II  TM Distance: >3 FB Neck ROM: Full    Dental no notable dental hx.    Pulmonary neg pulmonary ROS,    Pulmonary exam normal breath sounds clear to auscultation       Cardiovascular negative cardio ROS Normal cardiovascular exam Rhythm:Regular Rate:Normal     Neuro/Psych  Headaches, negative psych ROS   GI/Hepatic negative GI ROS, Neg liver ROS,   Endo/Other  Hypothyroidism   Renal/GU negative Renal ROS  negative genitourinary   Musculoskeletal negative musculoskeletal ROS (+)   Abdominal   Peds negative pediatric ROS (+)  Hematology negative hematology ROS (+)   Anesthesia Other Findings   Reproductive/Obstetrics negative OB ROS                             Anesthesia Physical Anesthesia Plan  ASA: II  Anesthesia Plan: General   Post-op Pain Management:    Induction: Intravenous  PONV Risk Score and Plan: 4 or greater and Ondansetron, Dexamethasone, Midazolam, Droperidol and Treatment may vary due to age or medical condition  Airway Management Planned: Oral ETT  Additional Equipment:   Intra-op Plan:   Post-operative Plan: Extubation in OR  Informed Consent: I have reviewed the patients History and Physical, chart, labs and discussed the procedure including the risks, benefits and alternatives for the proposed anesthesia with the patient or authorized representative who has indicated his/her understanding and acceptance.     Dental advisory given  Plan Discussed with: CRNA  Anesthesia Plan Comments:         Anesthesia Quick Evaluation

## 2020-02-20 NOTE — Interval H&P Note (Signed)
History and Physical Interval Note:  02/20/2020 10:55 AM  Rachel [name]  has presented today for surgery, with the diagnosis of CHRONIC RIGHT MAXILLARY ETHMOID AND MAXILLARY SINUSITITS.  The various methods of treatment have been discussed with the patient and family. After consideration of risks, benefits and other options for treatment, the patient has consented to  Procedure(s): ENDOSCOPIC SINUS SURGERY WITH RIGHT ETHMOIDECTOMY AND RIGHT MAXILLARY OSTIA ENLARGEMENT WITH FUSION (Right) SINUS ENDO WITH FUSION (Right) as a surgical intervention.  The patient's history has been reviewed, patient examined, no change in status, stable for surgery.  I have reviewed the patient's chart and labs.  Questions were answered to the patient's satisfaction.     [name]

## 2020-02-20 NOTE — Discharge Instructions (Addendum)
Elevate head of bed and apply cool compress to nose to reduce bleeding. Keflex 500 mg bid for the next 10 days Tylenol or ibuprofen or similar prn pain OK to blow nose but gently Can irrigate nose with saline nasal rinse Return to Dr Pollie Friar office this Friday at 4:30. Call office if you have any questions or problems     [phone]   Post Anesthesia Home Care Instructions  Activity: Get plenty of rest for the remainder of the day. A responsible individual must stay with you for 24 hours following the procedure.  For the next 24 hours, DO NOT: -Drive a car -Paediatric nurse -Drink alcoholic beverages -Take any medication unless instructed by your physician -Make any legal decisions or sign important papers.  Meals: Start with liquid foods such as gelatin or soup. Progress to regular foods as tolerated. Avoid greasy, spicy, heavy foods. If nausea and/or vomiting occur, drink only clear liquids until the nausea and/or vomiting subsides. Call your physician if vomiting continues.  Special Instructions/Symptoms: Your throat may feel dry or sore from the anesthesia or the breathing tube placed in your throat during surgery. If this causes discomfort, gargle with warm salt water. The discomfort should disappear within 24 hours.  If you had a scopolamine patch placed behind your ear for the management of post- operative nausea and/or vomiting:  1. The medication in the patch is effective for 72 hours, after which it should be removed.  Wrap patch in a tissue and discard in the trash. Wash hands thoroughly with soap and water. 2. You may remove the patch earlier than 72 hours if you experience unpleasant side effects which may include dry mouth, dizziness or visual disturbances. 3. Avoid touching the patch. Wash your hands with soap and water after contact with the patch.

## 2020-02-20 NOTE — Brief Op Note (Signed)
02/20/2020  12:40 PM  PATIENT:  Rachel [name]  55 y.o. 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  PROCEDURE:  Procedure(s): ENDOSCOPIC SINUS SURGERY WITH RIGHT ETHMOIDECTOMY AND RIGHT MAXILLARY OSTIA ENLARGEMENT WITH FUSION (Right) SINUS ENDO WITH FUSION (Right)  SURGEON:  Surgeon(s) and Role:    Rozetta Nunnery, MD - Primary  PHYSICIAN ASSISTANT:   ASSISTANTS: none   ANESTHESIA:   general  EBL:  50 mL   BLOOD ADMINISTERED:none  DRAINS: none   LOCAL MEDICATIONS USED:  XYLOCAINE with EPI 9 cc  SPECIMEN:  No Specimen  DISPOSITION OF SPECIMEN:  N/A  COUNTS:  YES  TOURNIQUET:  * No tourniquets in log *  DICTATION: .Other Dictation: Dictation Number [id number]  PLAN OF CARE: Discharge to home after PACU  PATIENT DISPOSITION:  PACU - hemodynamically stable.   Delay start of Pharmacological VTE agent (>24hrs) due to surgical blood loss or risk of bleeding: yes

## 2020-02-20 NOTE — Anesthesia Procedure Notes (Signed)
Procedure Name: Intubation Date/Time: 02/20/2020 11:31 AM Performed by: Maryella Shivers, CRNA Pre-anesthesia Checklist: Patient identified, Emergency Drugs available, Suction available and Patient being monitored Patient Re-evaluated:Patient Re-evaluated prior to induction Oxygen Delivery Method: Circle system utilized Preoxygenation: Pre-oxygenation with 100% oxygen Induction Type: IV induction Ventilation: Mask ventilation without difficulty Laryngoscope Size: Mac and 3 Grade View: Grade I Tube type: Oral Tube size: 7.0 mm Number of attempts: 1 Airway Equipment and Method: Stylet and Oral airway Placement Confirmation: ETT inserted through vocal cords under direct vision,  positive ETCO2 and breath sounds checked- equal and bilateral Secured at: 20 cm Tube secured with: Tape Dental Injury: Teeth and Oropharynx as per pre-operative assessment

## 2020-02-20 NOTE — Anesthesia Postprocedure Evaluation (Signed)
Anesthesia Post Note  Patient: Rachel [name] [facility]  Procedure(s) Performed: ENDOSCOPIC SINUS SURGERY WITH RIGHT ETHMOIDECTOMY AND RIGHT MAXILLARY OSTIA ENLARGEMENT WITH FUSION (Right Nose) SINUS ENDO WITH FUSION (Right Nose)     Patient location during evaluation: PACU Anesthesia Type: General Level of consciousness: awake and alert Pain management: pain level controlled Vital Signs Assessment: post-procedure vital signs reviewed and stable Respiratory status: spontaneous breathing, nonlabored ventilation and respiratory function stable Cardiovascular status: blood pressure returned to baseline and stable Postop Assessment: no apparent nausea or vomiting Anesthetic complications: no   No complications documented.  Last Vitals:  Vitals:   02/20/20 1325 02/20/20 1330  BP: 130/78 (!) 141/65  Pulse: 76   Resp: 18   Temp:  36.5 C  SpO2: 97% 99%    Last Pain:  Vitals:   02/20/20 1345  TempSrc:   PainSc: [place]

## 2020-02-20 NOTE — Interval H&P Note (Signed)
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  The various methods of treatment have been discussed with the patient and family. After consideration of risks, benefits and other options for treatment, the patient has consented to  Procedure(s): ENDOSCOPIC SINUS SURGERY WITH RIGHT ETHMOIDECTOMY AND RIGHT MAXILLARY OSTIA ENLARGEMENT WITH FUSION (Right) SINUS ENDO WITH FUSION (Right) as a surgical intervention.  The patient's history has been reviewed, patient examined, no change in status, stable for surgery.  I have reviewed the patient's chart and labs.  Questions were answered to the patient's satisfaction.     [name]

## 2020-02-21 ENCOUNTER — Encounter (HOSPITAL_BASED_OUTPATIENT_CLINIC_OR_DEPARTMENT_OTHER): Payer: Self-pay | Admitting: Otolaryngology

## 2020-02-21 LAB — SURGICAL PATHOLOGY

## 2020-02-23 ENCOUNTER — Encounter (HOSPITAL_BASED_OUTPATIENT_CLINIC_OR_DEPARTMENT_OTHER): Payer: Self-pay | Admitting: Otolaryngology

## 2020-02-24 ENCOUNTER — Encounter (INDEPENDENT_AMBULATORY_CARE_PROVIDER_SITE_OTHER): Payer: Self-pay | Admitting: Otolaryngology

## 2020-02-24 ENCOUNTER — Other Ambulatory Visit: Payer: Self-pay | Admitting: Hematology & Oncology

## 2020-02-24 ENCOUNTER — Ambulatory Visit (INDEPENDENT_AMBULATORY_CARE_PROVIDER_SITE_OTHER): Payer: BC Managed Care – PPO | Admitting: Otolaryngology

## 2020-02-24 ENCOUNTER — Other Ambulatory Visit: Payer: Self-pay

## 2020-02-24 VITALS — Temp 97.5°F

## 2020-02-24 DIAGNOSIS — Z4889 Encounter for other specified surgical aftercare: Secondary | ICD-10-CM

## 2020-02-24 DIAGNOSIS — Z1231 Encounter for screening mammogram for malignant neoplasm of breast: Secondary | ICD-10-CM

## 2020-02-24 NOTE — Progress Notes (Signed)
HPI: KRISTIANE MORSCH is a 55 y.o. female who presents 4 days s/p right FESS for chronic right maxillary and ethmoid sinus disease.  She had bleeding for 2 or 3 days postop but is doing better now but still with some congestion on the right side..   Past Medical History:  Diagnosis Date  . ASCUS (atypical squamous cells of undetermined significance) on Pap smear 02/2006, 05/2006   NEG HPV ----- PAP 12/2007 WNL  . ASCUS of cervix with negative high risk HPV 03/2018  . 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 Chiari malformation type I ([place])   . Chronic headache   . Complication of anesthesia   . Family history of uterine cancer   . History of breast cancer 2000  . Hypercholesteremia    diet controlled  . Hypothyroidism    on meds  . Osteoporosis 09/2018   T score -2.4.  Prior T score -2.5.  DEXA stable from prior study.  . Personal history of chemotherapy 2001  . Personal history of radiation therapy 2001  . PONV (postoperative nausea and vomiting)    Past Surgical History:  Procedure Laterality Date  . BREAST BIOPSY Left   . BREAST EXCISIONAL BIOPSY Left 2021  . BREAST EXCISIONAL BIOPSY Right   . BREAST LUMPECTOMY Right 2001  . CESAREAN SECTION    . COLPOSCOPY    . NASAL SINUS SURGERY Right 02/20/2020   Procedure: ENDOSCOPIC SINUS SURGERY WITH RIGHT ETHMOIDECTOMY AND RIGHT MAXILLARY OSTIA ENLARGEMENT WITH FUSION;  Surgeon: Rozetta Nunnery, MD;  Location: [place];  Service: ENT;  Laterality: Right;  . PELVIC LAPAROSCOPY    . SINUS ENDO WITH FUSION Right 02/20/2020   Procedure: SINUS ENDO WITH FUSION;  Surgeon: Rozetta Nunnery, MD;  Location: [place];  Service: ENT;  Laterality: Right;  . TONSILLECTOMY    . TONSILLECTOMY  1980  . [place] EXTRACTION  1986   Social History   Socioeconomic History  . Marital status: Married    Spouse name: Not on file  . Number of children: Not on  file  . Years of education: Not on file  . Highest education level: Not on file  Occupational History  . Not on file  Tobacco Use  . Smoking status: Never Smoker  . Smokeless tobacco: Never Used  . Tobacco comment: never used tobacco  Vaping Use  . Vaping Use: Never used  Substance and Sexual Activity  . Alcohol use: Not Currently    Alcohol/week: 0.0 standard drinks  . Drug use: No  . Sexual activity: Yes    Birth control/protection: Post-menopausal    Comment: 1st intercourse 55 yo-Fewer than 5 partners  Other Topics Concern  . Not on file  Social History Narrative  . Not on file   Social Determinants of Health   Financial Resource Strain: Not on file  Food Insecurity: Not on file  Transportation Needs: Not on file  Physical Activity: Not on file  Stress: Not on file  Social Connections: Not on file   Family History  Problem Relation Age of Onset  . Diabetes Father   . Uterine cancer Maternal Aunt 60  . Colon polyps Neg Hx   . Colon cancer Neg Hx   . Esophageal cancer Neg Hx   . Stomach cancer Neg Hx   . Rectal cancer Neg Hx    Allergies  Allergen Reactions  . Erythromycin Nausea  And Vomiting   Prior to Admission medications   Medication Sig Start Date End Date Taking? Authorizing Provider  acetaminophen (TYLENOL) 500 MG tablet Take 500 mg by mouth every 6 (six) hours as needed.    [provider]  butalbital-acetaminophen-caffeine (FIORICET, ESGIC) 50-325-40 MG per tablet TAKE 1 TABLET BY MOUTH EVERY 6 HOURS AS NEEDED FOR HEADACHE 05/19/14   Susy Frizzle, MD  cephALEXin (KEFLEX) 500 MG capsule Take 1 capsule (500 mg total) by mouth 2 (two) times daily. 02/20/20   Rozetta Nunnery, MD  Cholecalciferol (VITAMIN D PO) Take 2,000 Units by mouth.    [provider]  fluticasone (FLONASE) 50 MCG/ACT nasal spray INHALE 2 SPRASY IN EACH NOSTRIL ONCE DAILY    Pickard, Cammie Mcgee, MD  promethazine (PHENERGAN) 25 MG tablet TAKE 1 TABLET BY MOUTH  EVERY 8 HOURS AS NEEDED FOR NAUSEA/VOMITING 02/20/20   Susy Frizzle, MD  rizatriptan (MAXALT) 10 MG tablet TAKE 1 TABLET BY MOUTH EVERY DAY AS NEEDED FOR MIGRANE 10/12/18   Susy Frizzle, MD  UNITHROID 100 MCG tablet TAKE 1 TABLET BY MOUTH EVERY DAY BEFORE BREAKFAST 08/08/19   Elayne Snare, MD     Physical Exam: No active bleeding noted.  The nasal pore packing is intact.   Assessment: S/p right FESS  Plan: She will complete the remaining antibiotics.  I gave her a prescription for Z-Pak to take if she develops any fever or increasing pain. She will follow-up in 10 days for recheck and cleaning.  She is instructed to use the saline irrigations daily.   Radene Journey, MD

## 2020-02-25 LAB — AEROBIC/ANAEROBIC CULTURE W GRAM STAIN (SURGICAL/DEEP WOUND)

## 2020-02-28 ENCOUNTER — Other Ambulatory Visit: Payer: Self-pay | Admitting: Family Medicine

## 2020-02-29 MED ORDER — PROMETHAZINE HCL 25 MG PO TABS: ORAL_TABLET | ORAL | 0 refills | Status: DC

## 2020-03-05 ENCOUNTER — Ambulatory Visit (INDEPENDENT_AMBULATORY_CARE_PROVIDER_SITE_OTHER): Payer: BC Managed Care – PPO | Admitting: Otolaryngology

## 2020-03-05 ENCOUNTER — Encounter (INDEPENDENT_AMBULATORY_CARE_PROVIDER_SITE_OTHER): Payer: Self-pay | Admitting: Otolaryngology

## 2020-03-05 ENCOUNTER — Other Ambulatory Visit: Payer: Self-pay

## 2020-03-05 VITALS — Temp 97.5°F

## 2020-03-05 DIAGNOSIS — Z4889 Encounter for other specified surgical aftercare: Secondary | ICD-10-CM

## 2020-03-05 NOTE — Progress Notes (Signed)
HPI: Rachel [name] is a 55 y.o. female who presents 14 days s/p right FESS.  She has done well up until yesterday when she started having some odor in the right nose.Marland Kitchen   Past Medical History:  Diagnosis Date  . ASCUS (atypical squamous cells of undetermined significance) on Pap smear 02/2006, 05/2006   NEG HPV ----- PAP 12/2007 WNL  . ASCUS of cervix with negative high risk HPV 03/2018  . 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 Chiari malformation type I ([place])   . Chronic headache   . Complication of anesthesia   . Family history of uterine cancer   . History of breast cancer 2000  . Hypercholesteremia    diet controlled  . Hypothyroidism    on meds  . Osteoporosis 09/2018   T score -2.4.  Prior T score -2.5.  DEXA stable from prior study.  . Personal history of chemotherapy 2001  . Personal history of radiation therapy 2001  . PONV (postoperative nausea and vomiting)    Past Surgical History:  Procedure Laterality Date  . BREAST BIOPSY Left   . BREAST EXCISIONAL BIOPSY Left 2021  . BREAST EXCISIONAL BIOPSY Right   . BREAST LUMPECTOMY Right 2001  . CESAREAN SECTION    . COLPOSCOPY    . NASAL SINUS SURGERY Right 02/20/2020   Procedure: ENDOSCOPIC SINUS SURGERY WITH RIGHT ETHMOIDECTOMY AND RIGHT MAXILLARY OSTIA ENLARGEMENT WITH FUSION;  Surgeon: Rozetta Nunnery, MD;  Location: [place];  Service: ENT;  Laterality: Right;  . PELVIC LAPAROSCOPY    . SINUS ENDO WITH FUSION Right 02/20/2020   Procedure: SINUS ENDO WITH FUSION;  Surgeon: Rozetta Nunnery, MD;  Location: [place];  Service: ENT;  Laterality: Right;  . TONSILLECTOMY    . TONSILLECTOMY  1980  . [place] EXTRACTION  1986   Social History   Socioeconomic History  . Marital status: Married    Spouse name: Not on file  . Number of children: Not on file  . Years of education: Not on file  . Highest education level: Not on  file  Occupational History  . Not on file  Tobacco Use  . Smoking status: Never Smoker  . Smokeless tobacco: Never Used  . Tobacco comment: never used tobacco  Vaping Use  . Vaping Use: Never used  Substance and Sexual Activity  . Alcohol use: Not Currently    Alcohol/week: 0.0 standard drinks  . Drug use: No  . Sexual activity: Yes    Birth control/protection: Post-menopausal    Comment: 1st intercourse 55 yo-Fewer than 5 partners  Other Topics Concern  . Not on file  Social History Narrative  . Not on file   Social Determinants of Health   Financial Resource Strain: Not on file  Food Insecurity: Not on file  Transportation Needs: Not on file  Physical Activity: Not on file  Stress: Not on file  Social Connections: Not on file   Family History  Problem Relation Age of Onset  . Diabetes Father   . Uterine cancer Maternal Aunt 60  . Colon polyps Neg Hx   . Colon cancer Neg Hx   . Esophageal cancer Neg Hx   . Stomach cancer Neg Hx   . Rectal cancer Neg Hx    Allergies  Allergen Reactions  . Erythromycin Nausea And Vomiting   Prior to Admission medications   Medication Sig Start Date  End Date Taking? Authorizing Provider  acetaminophen (TYLENOL) 500 MG tablet Take 500 mg by mouth every 6 (six) hours as needed.    [provider]  butalbital-acetaminophen-caffeine (FIORICET, ESGIC) 50-325-40 MG per tablet TAKE 1 TABLET BY MOUTH EVERY 6 HOURS AS NEEDED FOR HEADACHE 05/19/14   Susy Frizzle, MD  cephALEXin (KEFLEX) 500 MG capsule Take 1 capsule (500 mg total) by mouth 2 (two) times daily. 02/20/20   Rozetta Nunnery, MD  Cholecalciferol (VITAMIN D PO) Take 2,000 Units by mouth.    [provider]  fluticasone (FLONASE) 50 MCG/ACT nasal spray INHALE 2 SPRASY IN EACH NOSTRIL ONCE DAILY    Pickard, Cammie Mcgee, MD  promethazine (PHENERGAN) 25 MG tablet TAKE 1 TABLET BY MOUTH EVERY 8 HOURS AS NEEDED FOR NAUSEA/VOMITING 02/29/20   Susy Frizzle, MD   rizatriptan (MAXALT) 10 MG tablet TAKE 1 TABLET BY MOUTH EVERY DAY AS NEEDED FOR MIGRANE 10/12/18   Susy Frizzle, MD  UNITHROID 100 MCG tablet TAKE 1 TABLET BY MOUTH EVERY DAY BEFORE BREAKFAST 08/08/19   Elayne Snare, MD     Physical Exam: She has mild packing and crusting in the right middle meatus that was removed in the office.  Otherwise a nasal endoscopy the maxillary ostia is widely patent with no purulent discharge in the ethmoid area revealed mild crusting scabbing but no gross mucopurulent discharge.    Assessment: S/p right FESS doing well.  Plan: Recommended regular use of saline rinses and will follow up in 10 days for recheck.   Radene Journey, MD

## 2020-03-16 ENCOUNTER — Encounter (INDEPENDENT_AMBULATORY_CARE_PROVIDER_SITE_OTHER): Payer: Self-pay | Admitting: Otolaryngology

## 2020-03-16 ENCOUNTER — Ambulatory Visit (INDEPENDENT_AMBULATORY_CARE_PROVIDER_SITE_OTHER): Payer: BC Managed Care – PPO | Admitting: Otolaryngology

## 2020-03-16 ENCOUNTER — Other Ambulatory Visit: Payer: Self-pay

## 2020-03-16 VITALS — Temp 97.2°F

## 2020-03-16 DIAGNOSIS — Z4889 Encounter for other specified surgical aftercare: Secondary | ICD-10-CM

## 2020-03-16 NOTE — Progress Notes (Signed)
HPI: Rachel [name] is a 55 y.o. female who presents 3-1/2 weeks  s/p right FESS.  She is doing well but still has some odor in the right nostril..   Past Medical History:  Diagnosis Date  . ASCUS (atypical squamous cells of undetermined significance) on Pap smear 02/2006, 05/2006   NEG HPV ----- PAP 12/2007 WNL  . ASCUS of cervix with negative high risk HPV 03/2018  . 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 Chiari malformation type I ([place])   . Chronic headache   . Complication of anesthesia   . Family history of uterine cancer   . History of breast cancer 2000  . Hypercholesteremia    diet controlled  . Hypothyroidism    on meds  . Osteoporosis 09/2018   T score -2.4.  Prior T score -2.5.  DEXA stable from prior study.  . Personal history of chemotherapy 2001  . Personal history of radiation therapy 2001  . PONV (postoperative nausea and vomiting)    Past Surgical History:  Procedure Laterality Date  . BREAST BIOPSY Left   . BREAST EXCISIONAL BIOPSY Left 2021  . BREAST EXCISIONAL BIOPSY Right   . BREAST LUMPECTOMY Right 2001  . CESAREAN SECTION    . COLPOSCOPY    . NASAL SINUS SURGERY Right 02/20/2020   Procedure: ENDOSCOPIC SINUS SURGERY WITH RIGHT ETHMOIDECTOMY AND RIGHT MAXILLARY OSTIA ENLARGEMENT WITH FUSION;  Surgeon: Rozetta Nunnery, MD;  Location: [place];  Service: ENT;  Laterality: Right;  . PELVIC LAPAROSCOPY    . SINUS ENDO WITH FUSION Right 02/20/2020   Procedure: SINUS ENDO WITH FUSION;  Surgeon: Rozetta Nunnery, MD;  Location: [place];  Service: ENT;  Laterality: Right;  . TONSILLECTOMY    . TONSILLECTOMY  1980  . [place] EXTRACTION  1986   Social History   Socioeconomic History  . Marital status: Married    Spouse name: Not on file  . Number of children: Not on file  . Years of education: Not on file  . Highest education level: Not on file  Occupational  History  . Not on file  Tobacco Use  . Smoking status: Never Smoker  . Smokeless tobacco: Never Used  . Tobacco comment: never used tobacco  Vaping Use  . Vaping Use: Never used  Substance and Sexual Activity  . Alcohol use: Not Currently    Alcohol/week: 0.0 standard drinks  . Drug use: No  . Sexual activity: Yes    Birth control/protection: Post-menopausal    Comment: 1st intercourse 55 yo-Fewer than 5 partners  Other Topics Concern  . Not on file  Social History Narrative  . Not on file   Social Determinants of Health   Financial Resource Strain: Not on file  Food Insecurity: Not on file  Transportation Needs: Not on file  Physical Activity: Not on file  Stress: Not on file  Social Connections: Not on file   Family History  Problem Relation Age of Onset  . Diabetes Father   . Uterine cancer Maternal Aunt 60  . Colon polyps Neg Hx   . Colon cancer Neg Hx   . Esophageal cancer Neg Hx   . Stomach cancer Neg Hx   . Rectal cancer Neg Hx    Allergies  Allergen Reactions  . Erythromycin Nausea And Vomiting   Prior to Admission medications   Medication Sig Start Date End Date Taking?  Authorizing Provider  acetaminophen (TYLENOL) 500 MG tablet Take 500 mg by mouth every 6 (six) hours as needed.    [provider]  butalbital-acetaminophen-caffeine (FIORICET, ESGIC) 50-325-40 MG per tablet TAKE 1 TABLET BY MOUTH EVERY 6 HOURS AS NEEDED FOR HEADACHE 05/19/14   Susy Frizzle, MD  cephALEXin (KEFLEX) 500 MG capsule Take 1 capsule (500 mg total) by mouth 2 (two) times [name]. 02/20/20   Rozetta Nunnery, MD  Cholecalciferol (VITAMIN D PO) Take 2,000 Units by mouth.    [provider]  fluticasone (FLONASE) 50 MCG/ACT nasal spray INHALE 2 SPRASY IN EACH NOSTRIL ONCE [name]    Pickard, Cammie Mcgee, MD  promethazine (PHENERGAN) 25 MG tablet TAKE 1 TABLET BY MOUTH EVERY 8 HOURS AS NEEDED FOR NAUSEA/VOMITING 02/29/20   Susy Frizzle, MD  rizatriptan (MAXALT)  10 MG tablet TAKE 1 TABLET BY MOUTH EVERY DAY AS NEEDED FOR MIGRANE 10/12/18   Susy Frizzle, MD  UNITHROID 100 MCG tablet TAKE 1 TABLET BY MOUTH EVERY DAY BEFORE BREAKFAST 08/08/19   Elayne Snare, MD     Physical Exam: On nasal endoscopy the middle meatus region is widely patent as is the maxillary ostia.  I was able to see inside the sinus which was clear with mild mucosal edema.  She still had little bit of scabbing and crusting along the posterior right middle turbinate.   Assessment: S/p right FESS  Plan: She will follow-up in 2-1/2 weeks for recheck.  Instructed to use saline irrigation if she has any odor or drainage from the nose.   Radene Journey, MD

## 2020-03-21 ENCOUNTER — Ambulatory Visit: Payer: BC Managed Care – PPO

## 2020-03-21 ENCOUNTER — Ambulatory Visit
Admission: RE | Admit: 2020-03-21 | Discharge: 2020-03-21 | Disposition: A | Discharge status: Home / Self Care | Payer: BC Managed Care – PPO | Source: Ambulatory Visit | Attending: Hematology & Oncology | Admitting: Hematology & Oncology

## 2020-03-21 ENCOUNTER — Other Ambulatory Visit: Payer: Self-pay

## 2020-03-21 DIAGNOSIS — Z1231 Encounter for screening mammogram for malignant neoplasm of breast: Secondary | ICD-10-CM

## 2020-03-28 ENCOUNTER — Other Ambulatory Visit: Payer: Self-pay

## 2020-03-28 MED ORDER — PROMETHAZINE HCL 25 MG PO TABS: ORAL_TABLET | ORAL | 0 refills | Status: DC

## 2020-04-02 ENCOUNTER — Encounter (INDEPENDENT_AMBULATORY_CARE_PROVIDER_SITE_OTHER): Payer: Self-pay | Admitting: Otolaryngology

## 2020-04-02 ENCOUNTER — Other Ambulatory Visit: Payer: Self-pay | Admitting: *Deleted

## 2020-04-02 ENCOUNTER — Ambulatory Visit (INDEPENDENT_AMBULATORY_CARE_PROVIDER_SITE_OTHER): Payer: BC Managed Care – PPO | Admitting: Otolaryngology

## 2020-04-02 ENCOUNTER — Inpatient Hospital Stay (HOSPITAL_BASED_OUTPATIENT_CLINIC_OR_DEPARTMENT_OTHER): Payer: BC Managed Care – PPO | Admitting: Hematology & Oncology

## 2020-04-02 ENCOUNTER — Telehealth: Payer: Self-pay

## 2020-04-02 ENCOUNTER — Inpatient Hospital Stay: Payer: BC Managed Care – PPO | Attending: Hematology & Oncology

## 2020-04-02 ENCOUNTER — Other Ambulatory Visit: Payer: Self-pay

## 2020-04-02 ENCOUNTER — Encounter: Payer: Self-pay | Admitting: Hematology & Oncology

## 2020-04-02 VITALS — BP 126/66 | HR 66 | Temp 98.2°F | Resp 18 | Wt 160.0 lb

## 2020-04-02 VITALS — Temp 97.2°F

## 2020-04-02 DIAGNOSIS — Z4889 Encounter for other specified surgical aftercare: Secondary | ICD-10-CM

## 2020-04-02 DIAGNOSIS — E038 Other specified hypothyroidism: Secondary | ICD-10-CM | POA: Diagnosis not present

## 2020-04-02 DIAGNOSIS — E785 Hyperlipidemia, unspecified: Secondary | ICD-10-CM | POA: Insufficient documentation

## 2020-04-02 DIAGNOSIS — E7800 Pure hypercholesterolemia, unspecified: Secondary | ICD-10-CM

## 2020-04-02 DIAGNOSIS — Z853 Personal history of malignant neoplasm of breast: Secondary | ICD-10-CM | POA: Insufficient documentation

## 2020-04-02 DIAGNOSIS — Z79899 Other long term (current) drug therapy: Secondary | ICD-10-CM | POA: Insufficient documentation

## 2020-04-02 DIAGNOSIS — Z171 Estrogen receptor negative status [ER-]: Secondary | ICD-10-CM | POA: Diagnosis not present

## 2020-04-02 DIAGNOSIS — C50611 Malignant neoplasm of axillary tail of right female breast: Secondary | ICD-10-CM | POA: Diagnosis not present

## 2020-04-02 DIAGNOSIS — E033 Postinfectious hypothyroidism: Secondary | ICD-10-CM

## 2020-04-02 LAB — LIPID PANEL
Cholesterol: 271 mg/dL — ABNORMAL HIGH (ref 0–200)
HDL: 53 mg/dL (ref >40)
LDL Cholesterol: 200 mg/dL — ABNORMAL HIGH (ref 0–99)
Total CHOL/HDL Ratio: 5.1 RATIO
Triglycerides: 89 mg/dL (ref <150)
VLDL: 18 mg/dL (ref 0–40)

## 2020-04-02 LAB — CMP (CANCER CENTER ONLY)
ALT: 25 U/L (ref 0–44)
AST: 22 U/L (ref 15–41)
Albumin: 4.5 g/dL (ref 3.5–5.0)
Alkaline Phosphatase: 95 U/L (ref 38–126)
Anion gap: 9 (ref 5–15)
BUN: 13 mg/dL (ref 6–20)
CO2: 28 mmol/L (ref 22–32)
Calcium: 10.1 mg/dL (ref 8.9–10.3)
Chloride: 106 mmol/L (ref 98–111)
Creatinine: 1.01 mg/dL — ABNORMAL HIGH (ref 0.44–1.00)
GFR, Estimated: >60 mL/min (ref >60)
Glucose, Bld: 113 mg/dL — ABNORMAL HIGH (ref 70–99)
Potassium: 4.6 mmol/L (ref 3.5–5.1)
Sodium: 143 mmol/L (ref 135–145)
Total Bilirubin: 0.5 mg/dL (ref 0.3–1.2)
Total Protein: 7.1 g/dL (ref 6.5–8.1)

## 2020-04-02 LAB — CBC WITH DIFFERENTIAL (CANCER CENTER ONLY)
Abs Immature Granulocytes: 0.01 10*3/uL (ref 0.00–0.07)
Basophils Absolute: 0.1 10*3/uL (ref 0.0–0.1)
Basophils Relative: 1 %
Eosinophils Absolute: 0.1 10*3/uL (ref 0.0–0.5)
Eosinophils Relative: 2 %
HCT: 45.3 % (ref 36.0–46.0)
Hemoglobin: 15 g/dL (ref 12.0–15.0)
Immature Granulocytes: 0 %
Lymphocytes Relative: 52 %
Lymphs Abs: 3.4 10*3/uL (ref 0.7–4.0)
MCH: 30.5 pg (ref 26.0–34.0)
MCHC: 33.1 g/dL (ref 30.0–36.0)
MCV: 92.3 fL (ref 80.0–100.0)
Monocytes Absolute: 0.4 10*3/uL (ref 0.1–1.0)
Monocytes Relative: 6 %
Neutro Abs: 2.5 10*3/uL (ref 1.7–7.7)
Neutrophils Relative %: 39 %
Platelet Count: 247 10*3/uL (ref 150–400)
RBC: 4.91 MIL/uL (ref 3.87–5.11)
RDW: 12.7 % (ref 11.5–15.5)
WBC Count: 6.5 10*3/uL (ref 4.0–10.5)
nRBC: 0 % (ref 0.0–0.2)

## 2020-04-02 LAB — LACTATE DEHYDROGENASE: LDH: 145 U/L (ref 98–192)

## 2020-04-02 LAB — TSH: TSH: 7.18 u[IU]/mL — ABNORMAL HIGH (ref 0.308–3.960)

## 2020-04-02 MED ORDER — LEVOTHYROXINE SODIUM 112 MCG PO TABS: 112.0000 ug | ORAL_TABLET | Freq: Every day | ORAL | 1 refills | Status: DC

## 2020-04-02 MED ORDER — PROMETHAZINE HCL 25 MG PO TABS: ORAL_TABLET | ORAL | 0 refills | Status: DC

## 2020-04-02 NOTE — Progress Notes (Signed)
HPI: Rachel [name] is a 55 y.o. female who presents 6 weeks days s/p right FESS.  She is doing much better with no odor.  She just recently expelled a large scab from the right nostril posteriorly..   Past Medical History:  Diagnosis Date  . ASCUS (atypical squamous cells of undetermined significance) on Pap smear 02/2006, 05/2006   NEG HPV ----- PAP 12/2007 WNL  . ASCUS of cervix with negative high risk HPV 03/2018  . 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 Chiari malformation type I ([place])   . Chronic headache   . Complication of anesthesia   . Family history of uterine cancer   . History of breast cancer 2000  . Hypercholesteremia    diet controlled  . Hypothyroidism    on meds  . Osteoporosis 09/2018   T score -2.4.  Prior T score -2.5.  DEXA stable from prior study.  . Personal history of chemotherapy 2001  . Personal history of radiation therapy 2001  . PONV (postoperative nausea and vomiting)    Past Surgical History:  Procedure Laterality Date  . BREAST BIOPSY Left   . BREAST EXCISIONAL BIOPSY Left 2021  . BREAST EXCISIONAL BIOPSY Right   . BREAST LUMPECTOMY Right 2001  . CESAREAN SECTION    . COLPOSCOPY    . NASAL SINUS SURGERY Right 02/20/2020   Procedure: ENDOSCOPIC SINUS SURGERY WITH RIGHT ETHMOIDECTOMY AND RIGHT MAXILLARY OSTIA ENLARGEMENT WITH FUSION;  Surgeon: Rozetta Nunnery, MD;  Location: [facility];  Service: ENT;  Laterality: Right;  . PELVIC LAPAROSCOPY    . SINUS ENDO WITH FUSION Right 02/20/2020   Procedure: SINUS ENDO WITH FUSION;  Surgeon: Rozetta Nunnery, MD;  Location: [facility];  Service: ENT;  Laterality: Right;  . TONSILLECTOMY    . TONSILLECTOMY  1980  . [place] EXTRACTION  1986   Social History   Socioeconomic History  . Marital status: Married    Spouse name: Not on file  . Number of children: Not on file  . Years of education: Not on file  .  Highest education level: Not on file  Occupational History  . Not on file  Tobacco Use  . Smoking status: Never Smoker  . Smokeless tobacco: Never Used  . Tobacco comment: never used tobacco  Vaping Use  . Vaping Use: Never used  Substance and Sexual Activity  . Alcohol use: Not Currently    Alcohol/week: 0.0 standard drinks  . Drug use: No  . Sexual activity: Yes    Birth control/protection: Post-menopausal    Comment: 1st intercourse 55 yo-Fewer than 5 partners  Other Topics Concern  . Not on file  Social History Narrative  . Not on file   Social Determinants of Health   Financial Resource Strain: Not on file  Food Insecurity: Not on file  Transportation Needs: Not on file  Physical Activity: Not on file  Stress: Not on file  Social Connections: Not on file   Family History  Problem Relation Age of Onset  . Diabetes Father   . Uterine cancer Maternal Aunt 60  . Colon polyps Neg Hx   . Colon cancer Neg Hx   . Esophageal cancer Neg Hx   . Stomach cancer Neg Hx   . Rectal cancer Neg Hx    Allergies  Allergen Reactions  . Erythromycin Nausea And Vomiting   Prior to Admission medications  Medication Sig Start Date End Date Taking? Authorizing Provider  butalbital-acetaminophen-caffeine (FIORICET, ESGIC) 50-325-40 MG per tablet TAKE 1 TABLET BY MOUTH EVERY 6 HOURS AS NEEDED FOR HEADACHE 05/19/14   Susy Frizzle, MD  Cholecalciferol (VITAMIN D PO) Take 2,000 Units by mouth.    [provider]  fluticasone (FLONASE) 50 MCG/ACT nasal spray INHALE 2 SPRASY IN EACH NOSTRIL ONCE DAILY    Pickard, Cammie Mcgee, MD  levothyroxine (SYNTHROID) 112 MCG tablet Take 1 tablet (112 mcg total) by mouth daily before breakfast. 04/02/20   Volanda Napoleon, MD  promethazine (PHENERGAN) 25 MG tablet TAKE 1 TABLET BY MOUTH EVERY 8 HOURS AS NEEDED FOR NAUSEA/VOMITING 03/28/20   Susy Frizzle, MD  rizatriptan (MAXALT) 10 MG tablet TAKE 1 TABLET BY MOUTH EVERY DAY AS NEEDED FOR  MIGRANE 10/12/18   Susy Frizzle, MD  UNITHROID 100 MCG tablet TAKE 1 TABLET BY MOUTH EVERY DAY BEFORE BREAKFAST 08/08/19   Elayne Snare, MD     Physical Exam: On nasal endoscopy she has minimal scabbing posteriorly in the right middle meatus.  The ethmoid area as well as the maxillary ostia are widely patent and are clear otherwise.   Assessment: S/p right FESS  Plan: Doing well.  She will follow-up as needed.   Radene Journey, MD

## 2020-04-02 NOTE — Telephone Encounter (Signed)
Called and informed patient of lab results. Confirmed pharmacy, rx sent and scheduling message sent for lab draw in 8 weeks. pateint verbalized all understanding.

## 2020-04-02 NOTE — Telephone Encounter (Signed)
-----   Message from Volanda Napoleon, MD sent at 04/02/2020  2:53 PM EST ----- Call - the thyroid is quite low!!  She needs to increase the Synthroid to 112 mcg a day. Please call this new dose into her pharmacy.  Need to repeat the TSH in 8 weeks.   Laurey Arrow

## 2020-04-02 NOTE — Telephone Encounter (Signed)
appts made per 04/02/20 los and pt req not to be printed and will view on my chart   Rachel [name]

## 2020-04-02 NOTE — Telephone Encounter (Signed)
S/w pt per sch message and she is aware of her 05/28/20 lab work appt   [employer]

## 2020-04-02 NOTE — Progress Notes (Signed)
Hematology and Oncology Follow Up Visit  Rachel [name] [facility] [id number] 12-Oct-1965 55 y.o. 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 is back for followup.  As always, it is somewhat fun talking to her.  We see her every 6 months.  She now has a third grandchild.  He was born about 7 weeks ago.  The big news is that she had sinus surgery back in early January.  As always, this was done expertly by Dr. Radene Journey.  So my she had a quite a bit of infection.  This was on her right maxillary sinus.  Thankfully, the pathology report did not show any evidence of malignancy.  She did grow staph in the sinus drainage.  This was sensitive to all antibiotics but oxacillin.  She has had no problems since then.  She sees Dr. Lucia Gaskins today.  Hopefully, he will release her.  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  She really enjoys this.  She has had no problems with cough or shortness of breath.  She has had no nausea or vomiting.  There is been no change in bowel or bladder habits.  The one problem that she has is cholesterol.  She has a high cholesterol.  We will have to see what her cholesterol/HDL ratio is.  Last time we saw her he was 6.2.  This certainly is on the high side.  She really is averse to taking cholesterol agents.  She has had no problems with leg swelling.  She has had no issues with her thyroid.  She is on Synthroid.  Overall, her performance status is ECOG 0.  Medications:  Current Outpatient Medications:  .  butalbital-acetaminophen-caffeine (FIORICET, ESGIC) 50-325-40 MG per tablet, TAKE 1 TABLET BY MOUTH EVERY 6 HOURS AS NEEDED FOR HEADACHE, Disp: 20 tablet, Rfl: 0 .  Cholecalciferol (VITAMIN D PO), Take 2,000 Units by mouth., Disp: , Rfl:  .  fluticasone (FLONASE) 50 MCG/ACT nasal spray, INHALE 2 SPRASY IN EACH NOSTRIL ONCE DAILY, Disp: 16 g, Rfl:  11 .  promethazine (PHENERGAN) 25 MG tablet, TAKE 1 TABLET BY MOUTH EVERY 8 HOURS AS NEEDED FOR NAUSEA/VOMITING, Disp: 20 tablet, Rfl: 0 .  rizatriptan (MAXALT) 10 MG tablet, TAKE 1 TABLET BY MOUTH EVERY DAY AS NEEDED FOR MIGRANE, Disp: 6 tablet, Rfl: 5 .  UNITHROID 100 MCG tablet, TAKE 1 TABLET BY MOUTH EVERY DAY BEFORE BREAKFAST, Disp: 90 tablet, Rfl: 3  Allergies:  Allergies  Allergen Reactions  . Erythromycin Nausea And Vomiting    Past Medical History, Surgical history, Social history, and Family History were reviewed and updated.  Review of Systems: Review of Systems  Constitutional: Negative.   HENT: Positive for congestion.   Eyes: Negative.   Respiratory: Negative.   Cardiovascular: Negative.   Gastrointestinal: Negative.   Genitourinary: Negative.   Musculoskeletal: Negative.   Skin: Negative.   Neurological: Negative.   Endo/Heme/Allergies: Negative.   Psychiatric/Behavioral: Negative.      Physical Exam:  weight is 160 lb (72.6 kg). Her oral temperature is 98.2 F (36.8 C). Her blood pressure is 126/66 and her pulse is 66. Her respiration is 18 and oxygen saturation is 100%.   Physical Exam Vitals reviewed.  Constitutional:      Comments: Her breast exam shows right breast with no masses, edema or erythema.  She has a  well-healed lumpectomy at the 10 o'clock position on the right breast.  No erythema or masses are noted.  There is no right axillary adenopathy.  Left breast shows no masses, edema or erythema.  The biopsy site is well-healed.  There is no left axillary adenopathy.  HENT:     Head: Normocephalic and atraumatic.  Eyes:     Pupils: Pupils are equal, round, and reactive to light.  Cardiovascular:     Rate and Rhythm: Normal rate and regular rhythm.     Heart sounds: Normal heart sounds.  Pulmonary:     Effort: Pulmonary effort is normal.     Breath sounds: Normal breath sounds.  Abdominal:     General: Bowel sounds are normal.     Palpations:  Abdomen is soft.  Musculoskeletal:        General: No tenderness or deformity. Normal range of motion.     Cervical back: Normal range of motion.  Lymphadenopathy:     Cervical: No cervical adenopathy.  Skin:    General: Skin is warm and dry.     Findings: No erythema or rash.  Neurological:     Mental Status: She is alert and oriented to person, place, and time.  Psychiatric:        Behavior: Behavior normal.        Thought Content: Thought content normal.        Judgment: Judgment normal.      Lab Results  Component Value Date   WBC 6.5 04/02/2020   HGB 15.0 04/02/2020   HCT 45.3 04/02/2020   MCV 92.3 04/02/2020   PLT 247 04/02/2020     Chemistry      Component Value Date/Time   NA 143 04/02/2020 0803   NA 140 09/26/2016 0743   NA 143 09/21/2015 0854   K 4.6 04/02/2020 0803   K 3.8 09/26/2016 0743   K 4.4 09/21/2015 0854   CL 106 04/02/2020 0803   CL 104 09/26/2016 0743   CO2 28 04/02/2020 0803   CO2 30 09/26/2016 0743   CO2 28 09/21/2015 0854   BUN 13 04/02/2020 0803   BUN 14 09/26/2016 0743   BUN 13.9 09/21/2015 0854   CREATININE 1.01 (H) 04/02/2020 0803   CREATININE 0.87 01/01/2018 1055   CREATININE 0.8 09/21/2015 0854      Component Value Date/Time   CALCIUM 10.1 04/02/2020 0803   CALCIUM 9.4 09/26/2016 0743   CALCIUM 10.1 09/21/2015 0854   ALKPHOS 95 04/02/2020 0803   ALKPHOS 80 09/26/2016 0743   ALKPHOS 98 09/21/2015 0854   AST 22 04/02/2020 0803   AST 37 (H) 09/21/2015 0854   ALT 25 04/02/2020 0803   ALT 35 09/26/2016 0743   ALT 57 (H) 09/21/2015 0854   BILITOT 0.5 04/02/2020 0803   BILITOT 0.53 09/21/2015 0854      Impression and Plan: Rachel [name] is a 55 year old white female with a history of stage I ductal carcinoma of the right breast. She was diagnosed 19 years ago. 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 I  think that  she is cured.  So far, everything is going quite well.    She just had a mammogram done last week.  Everything looks fine with the  mammogram.    We will have to really talk to her more about the cholesterol issues.  Again, we will see what her levels are.  I know she is quite averse to taking anticholesterol medication but this might be something that  she is going to have to do.  We will see her back in 6 months.   Volanda Napoleon, MD 2/21/20229:23 AM

## 2020-05-01 ENCOUNTER — Other Ambulatory Visit: Payer: Self-pay | Admitting: Hematology & Oncology

## 2020-05-26 ENCOUNTER — Other Ambulatory Visit: Payer: Self-pay | Admitting: Hematology & Oncology

## 2020-05-28 ENCOUNTER — Other Ambulatory Visit: Payer: Self-pay

## 2020-05-28 ENCOUNTER — Inpatient Hospital Stay: Payer: BC Managed Care – PPO | Attending: Hematology & Oncology

## 2020-05-28 DIAGNOSIS — E785 Hyperlipidemia, unspecified: Secondary | ICD-10-CM | POA: Diagnosis not present

## 2020-05-28 DIAGNOSIS — C50611 Malignant neoplasm of axillary tail of right female breast: Secondary | ICD-10-CM

## 2020-05-28 DIAGNOSIS — E7800 Pure hypercholesterolemia, unspecified: Secondary | ICD-10-CM

## 2020-05-28 DIAGNOSIS — E038 Other specified hypothyroidism: Secondary | ICD-10-CM

## 2020-05-28 DIAGNOSIS — Z853 Personal history of malignant neoplasm of breast: Secondary | ICD-10-CM | POA: Insufficient documentation

## 2020-05-28 DIAGNOSIS — Z171 Estrogen receptor negative status [ER-]: Secondary | ICD-10-CM

## 2020-05-28 DIAGNOSIS — Z79899 Other long term (current) drug therapy: Secondary | ICD-10-CM | POA: Diagnosis not present

## 2020-05-28 LAB — TSH: TSH: 3.378 u[IU]/mL (ref 0.308–3.960)

## 2020-05-29 ENCOUNTER — Encounter: Payer: Self-pay | Admitting: *Deleted

## 2020-07-02 ENCOUNTER — Other Ambulatory Visit: Payer: Self-pay | Admitting: Hematology & Oncology

## 2020-08-14 ENCOUNTER — Other Ambulatory Visit: Payer: Self-pay | Admitting: Hematology & Oncology

## 2020-09-18 ENCOUNTER — Other Ambulatory Visit: Payer: Self-pay | Admitting: Hematology & Oncology

## 2020-09-25 ENCOUNTER — Other Ambulatory Visit: Payer: Self-pay | Admitting: Family Medicine

## 2020-10-01 ENCOUNTER — Encounter: Payer: Self-pay | Admitting: Hematology & Oncology

## 2020-10-01 ENCOUNTER — Other Ambulatory Visit: Payer: Self-pay

## 2020-10-01 ENCOUNTER — Inpatient Hospital Stay: Payer: BC Managed Care – PPO | Attending: Hematology & Oncology

## 2020-10-01 ENCOUNTER — Inpatient Hospital Stay: Payer: BC Managed Care – PPO | Admitting: Hematology & Oncology

## 2020-10-01 ENCOUNTER — Telehealth: Payer: Self-pay

## 2020-10-01 VITALS — BP 121/63 | HR 56 | Temp 98.8°F | Resp 20 | Wt 152.0 lb

## 2020-10-01 DIAGNOSIS — Z853 Personal history of malignant neoplasm of breast: Secondary | ICD-10-CM | POA: Diagnosis not present

## 2020-10-01 DIAGNOSIS — C50611 Malignant neoplasm of axillary tail of right female breast: Secondary | ICD-10-CM | POA: Diagnosis not present

## 2020-10-01 DIAGNOSIS — Z171 Estrogen receptor negative status [ER-]: Secondary | ICD-10-CM

## 2020-10-01 DIAGNOSIS — E785 Hyperlipidemia, unspecified: Secondary | ICD-10-CM | POA: Diagnosis not present

## 2020-10-01 DIAGNOSIS — E7800 Pure hypercholesterolemia, unspecified: Secondary | ICD-10-CM

## 2020-10-01 DIAGNOSIS — Z79899 Other long term (current) drug therapy: Secondary | ICD-10-CM | POA: Insufficient documentation

## 2020-10-01 DIAGNOSIS — E038 Other specified hypothyroidism: Secondary | ICD-10-CM

## 2020-10-01 LAB — CBC WITH DIFFERENTIAL (CANCER CENTER ONLY)
Abs Immature Granulocytes: 0.01 10*3/uL (ref 0.00–0.07)
Basophils Absolute: 0.1 10*3/uL (ref 0.0–0.1)
Basophils Relative: 1 %
Eosinophils Absolute: 0.1 10*3/uL (ref 0.0–0.5)
Eosinophils Relative: 2 %
HCT: 43.9 % (ref 36.0–46.0)
Hemoglobin: 14.9 g/dL (ref 12.0–15.0)
Immature Granulocytes: 0 %
Lymphocytes Relative: 48 %
Lymphs Abs: 2.9 10*3/uL (ref 0.7–4.0)
MCH: 30.6 pg (ref 26.0–34.0)
MCHC: 33.9 g/dL (ref 30.0–36.0)
MCV: 90.1 fL (ref 80.0–100.0)
Monocytes Absolute: 0.4 10*3/uL (ref 0.1–1.0)
Monocytes Relative: 7 %
Neutro Abs: 2.5 10*3/uL (ref 1.7–7.7)
Neutrophils Relative %: 42 %
Platelet Count: 252 10*3/uL (ref 150–400)
RBC: 4.87 MIL/uL (ref 3.87–5.11)
RDW: 12.6 % (ref 11.5–15.5)
WBC Count: 6 10*3/uL (ref 4.0–10.5)
nRBC: 0 % (ref 0.0–0.2)

## 2020-10-01 LAB — CMP (CANCER CENTER ONLY)
ALT: 28 U/L (ref 0–44)
AST: 22 U/L (ref 15–41)
Albumin: 4.3 g/dL (ref 3.5–5.0)
Alkaline Phosphatase: 96 U/L (ref 38–126)
Anion gap: 6 (ref 5–15)
BUN: 13 mg/dL (ref 6–20)
CO2: 31 mmol/L (ref 22–32)
Calcium: 9.8 mg/dL (ref 8.9–10.3)
Chloride: 105 mmol/L (ref 98–111)
Creatinine: 0.97 mg/dL (ref 0.44–1.00)
GFR, Estimated: >60 mL/min (ref >60)
Glucose, Bld: 103 mg/dL — ABNORMAL HIGH (ref 70–99)
Potassium: 4.6 mmol/L (ref 3.5–5.1)
Sodium: 142 mmol/L (ref 135–145)
Total Bilirubin: 0.4 mg/dL (ref 0.3–1.2)
Total Protein: 6.9 g/dL (ref 6.5–8.1)

## 2020-10-01 LAB — LIPID PANEL
Cholesterol: 243 mg/dL — ABNORMAL HIGH (ref 0–200)
HDL: 49 mg/dL (ref >40)
LDL Cholesterol: 174 mg/dL — ABNORMAL HIGH (ref 0–99)
Total CHOL/HDL Ratio: 5 RATIO
Triglycerides: 101 mg/dL (ref <150)
VLDL: 20 mg/dL (ref 0–40)

## 2020-10-01 LAB — VITAMIN D 25 HYDROXY (VIT D DEFICIENCY, FRACTURES): Vit D, 25-Hydroxy: 54.32 ng/mL (ref 30–100)

## 2020-10-01 NOTE — Progress Notes (Signed)
Hematology and Oncology Follow Up Visit  Rachel [name] [facility] [id number] [birth year] 55 y.o. 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 is back for followup.  We see her every 6 months.  It is always nice catching up with her.  Unfortunate, the still do not have a new house built.  This is becoming much more of an issue.  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  She is looking forward to this.  She had a big family vacation at the end of July.  Unfortunate, 10 out of 11 family members got COVID.  She was the one who did not get COVID.  She is enjoying her 3 grandchildren.  I saw pictures.  They are all very cute.  She still has problems with her cholesterol.  She is trying to lower this naturally.  She has had no problems with fever.  She has had no cough.  She has had no change in bowel or bladder habits.  She has had no rashes.  She has had no swollen lymph nodes.  Overall, I would say performance status is ECOG 0.    Medications:  Current Outpatient Medications:    butalbital-acetaminophen-caffeine (FIORICET, ESGIC) 50-325-40 MG per tablet, TAKE 1 TABLET BY MOUTH EVERY 6 HOURS AS NEEDED FOR HEADACHE, Disp: 20 tablet, Rfl: 0   Cholecalciferol (VITAMIN D PO), Take 2,000 Units by mouth., Disp: , Rfl:    rizatriptan (MAXALT) 10 MG tablet, TAKE 1 TABLET BY MOUTH EVERY DAY AS NEEDED FOR MIGRANE, Disp: 6 tablet, Rfl: 5   UNITHROID 112 MCG tablet, TAKE 1 TABLET BY MOUTH EVERY DAY BEFORE BREAKFAST, Disp: 30 tablet, Rfl: 1   fluticasone (FLONASE) 50 MCG/ACT nasal spray, INHALE 2 SPRASY IN EACH NOSTRIL ONCE DAILY (Patient not taking: Reported on 10/01/2020), Disp: 16 g, Rfl: 11   pantoprazole (PROTONIX) 40 MG tablet, TAKE 1 TABLET BY MOUTH EVERY DAY (Patient not taking: Reported on 10/01/2020), Disp: 90 tablet, Rfl: 3   promethazine  (PHENERGAN) 25 MG tablet, TAKE 1 TABLET BY MOUTH EVERY 8 HOURS AS NEEDED FOR NAUSEA/VOMITING (Patient not taking: Reported on 10/01/2020), Disp: 20 tablet, Rfl: 0  Allergies:  Allergies  Allergen Reactions   Erythromycin Nausea And Vomiting    Past Medical History, Surgical history, Social history, and Family History were reviewed and updated.  Review of Systems: Review of Systems  Constitutional: Negative.   HENT:  Positive for congestion.   Eyes: Negative.   Respiratory: Negative.    Cardiovascular: Negative.   Gastrointestinal: Negative.   Genitourinary: Negative.   Musculoskeletal: Negative.   Skin: Negative.   Neurological: Negative.   Endo/Heme/Allergies: Negative.   Psychiatric/Behavioral: Negative.      Physical Exam:  weight is 152 lb (68.9 kg). Her oral temperature is 98.8 F (37.1 C). Her blood pressure is 121/63 and her pulse is 56 (abnormal). Her respiration is 20 and oxygen saturation is 99%.   Physical Exam Vitals reviewed.  Constitutional:      Comments: Her breast exam shows right breast with no masses, edema or erythema.  She has a well-healed lumpectomy at the 10 o'clock position on the right breast.  No erythema or masses are noted.  There is no right axillary adenopathy.  Left breast shows no masses, edema or erythema.  The biopsy site is well-healed.  There is no left axillary adenopathy.  HENT:     Head: Normocephalic and atraumatic.  Eyes:     Pupils: Pupils are equal, round, and reactive to light.  Cardiovascular:     Rate and Rhythm: Normal rate and regular rhythm.     Heart sounds: Normal heart sounds.  Pulmonary:     Effort: Pulmonary effort is normal.     Breath sounds: Normal breath sounds.  Abdominal:     General: Bowel sounds are normal.     Palpations: Abdomen is soft.  Musculoskeletal:        General: No tenderness or deformity. Normal range of motion.     Cervical back: Normal range of motion.  Lymphadenopathy:     Cervical: No  cervical adenopathy.  Skin:    General: Skin is warm and dry.     Findings: No erythema or rash.  Neurological:     Mental Status: She is alert and oriented to person, place, and time.  Psychiatric:        Behavior: Behavior normal.        Thought Content: Thought content normal.        Judgment: Judgment normal.     Lab Results  Component Value Date   WBC 6.0 10/01/2020   HGB 14.9 10/01/2020   HCT 43.9 10/01/2020   MCV 90.1 10/01/2020   PLT 252 10/01/2020     Chemistry      Component Value Date/Time   NA 142 10/01/2020 0910   NA 140 09/26/2016 0743   NA 143 09/21/2015 0854   K 4.6 10/01/2020 0910   K 3.8 09/26/2016 0743   K 4.4 09/21/2015 0854   CL 105 10/01/2020 0910   CL 104 09/26/2016 0743   CO2 31 10/01/2020 0910   CO2 30 09/26/2016 0743   CO2 28 09/21/2015 0854   BUN 13 10/01/2020 0910   BUN 14 09/26/2016 0743   BUN 13.9 09/21/2015 0854   CREATININE 0.97 10/01/2020 0910   CREATININE 0.87 01/01/2018 1055   CREATININE 0.8 09/21/2015 0854      Component Value Date/Time   CALCIUM 9.8 10/01/2020 0910   CALCIUM 9.4 09/26/2016 0743   CALCIUM 10.1 09/21/2015 0854   ALKPHOS 96 10/01/2020 0910   ALKPHOS 80 09/26/2016 0743   ALKPHOS 98 09/21/2015 0854   AST 22 10/01/2020 0910   AST 37 (H) 09/21/2015 0854   ALT 28 10/01/2020 0910   ALT 35 09/26/2016 0743   ALT 57 (H) 09/21/2015 0854   BILITOT 0.4 10/01/2020 0910   BILITOT 0.53 09/21/2015 0854      Impression and Plan: Rachel [name] is a 55 year old white female with a history of stage I ductal carcinoma of the right breast. She was diagnosed 19 years ago. 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 I  think that  she is cured.  So far, everything is going quite well.    I am happy that she is doing so well.  I am happy that she Is back in school.  Hopefully, she will be able to work for a while.  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  We will plan to get her back in another 6 months.  We will see what her cholesterol level is.    I forgot to mention that she does not have a gynecologist now.  I will have to make referral  to Dr. Edwinna Areola to see if she can see Ms. Vicencio as a patient.    Volanda Napoleon, MD 8/22/202210:40 AM

## 2020-10-01 NOTE — Telephone Encounter (Signed)
Appts made and printed for pt per los   Sherilynn Dieu

## 2020-10-02 ENCOUNTER — Telehealth (HOSPITAL_BASED_OUTPATIENT_CLINIC_OR_DEPARTMENT_OTHER): Payer: Self-pay | Admitting: Obstetrics & Gynecology

## 2020-10-02 ENCOUNTER — Encounter: Payer: Self-pay | Admitting: *Deleted

## 2020-10-02 NOTE — Telephone Encounter (Signed)
Called patient and left message to call the office to set up appointment with Dr.Miller .

## 2020-10-15 ENCOUNTER — Other Ambulatory Visit: Payer: Self-pay | Admitting: Hematology & Oncology

## 2020-11-09 ENCOUNTER — Other Ambulatory Visit: Payer: Self-pay | Admitting: Hematology & Oncology

## 2020-11-27 ENCOUNTER — Ambulatory Visit (INDEPENDENT_AMBULATORY_CARE_PROVIDER_SITE_OTHER): Payer: BC Managed Care – PPO | Admitting: Family Medicine

## 2020-11-27 ENCOUNTER — Other Ambulatory Visit: Payer: Self-pay

## 2020-11-27 VITALS — BP 110/78 | HR 73 | Temp 98.6°F | Resp 20 | Ht 64.0 in | Wt 153.0 lb

## 2020-11-27 DIAGNOSIS — J0100 Acute maxillary sinusitis, unspecified: Secondary | ICD-10-CM | POA: Diagnosis not present

## 2020-11-27 MED ORDER — AMOXICILLIN-POT CLAVULANATE 875-125 MG PO TABS: 1.0000 | ORAL_TABLET | Freq: Two times a day (BID) | ORAL | 0 refills | Status: DC

## 2020-11-27 MED ORDER — FLUCONAZOLE 150 MG PO TABS: 150.0000 mg | ORAL_TABLET | Freq: Once | ORAL | 0 refills | Status: AC

## 2020-11-27 NOTE — Progress Notes (Signed)
Subjective:    Patient ID: Rachel [name], female    DOB: 12-25-1965, 55 y.o.   MRN: [id number]  Patient reports a sinus infection in her right frontal and right maxillary sinus.  The pain is primarily in her right maxillary sinus.  She states that for the last 6 weeks, she has had almost daily pain in the right side of her head particularly behind the right eye and in the right cheek.  Sometimes there is phonophobia and photophobia along with nausea.  Sometimes her teeth hurt.  She had a CT scan last year that revealed: Asymmetric opacification of the right ethmoid and maxillary sinus. Increased soft tissue around the right middle turbinate possible obstructing polyp or mass. Recommend direct visualization. Ultimately she had to see ENT for operative management of chronic sinusitis.  She states that this feels similar to the way she felt back then but she is also having features of migraines.  She does have a longstanding history of migraines and recently has been under more stress given potential cancer in her husband. Past Medical History:  Diagnosis Date   ASCUS (atypical squamous cells of undetermined significance) on Pap smear 02/2006, 05/2006   NEG HPV ----- PAP 12/2007 WNL   ASCUS of cervix with negative high risk HPV 03/2018   Cancer ([place]) 2001   HISTORY OF STAGE I INFILTRATING DUCTAL CARCINOMA OF RIGHT BREAST   Chiari malformation type I ([place])    Chronic headache    Complication of anesthesia    Family history of uterine cancer    History of breast cancer 2000   Hypercholesteremia    diet controlled   Hypothyroidism    on meds   Osteoporosis 09/2018   T score -2.4.  Prior T score -2.5.  DEXA stable from prior study.   Personal history of chemotherapy 2001   Personal history of radiation therapy 2001   PONV (postoperative nausea and vomiting)    Past Surgical History:  Procedure Laterality Date   BREAST BIOPSY Left    BREAST EXCISIONAL BIOPSY Left 2021   BREAST EXCISIONAL  BIOPSY Right    BREAST LUMPECTOMY Right 2001   CESAREAN SECTION     COLPOSCOPY     NASAL SINUS SURGERY Right 02/20/2020   Procedure: ENDOSCOPIC SINUS SURGERY WITH RIGHT ETHMOIDECTOMY AND RIGHT MAXILLARY OSTIA ENLARGEMENT WITH FUSION;  Surgeon: Rozetta Nunnery, MD;  Location: [facility];  Service: ENT;  Laterality: Right;   PELVIC LAPAROSCOPY     SINUS ENDO WITH FUSION Right 02/20/2020   Procedure: SINUS ENDO WITH FUSION;  Surgeon: Rozetta Nunnery, MD;  Location: [place];  Service: ENT;  Laterality: Right;   [place]   Current Outpatient Medications on File Prior to Visit  Medication Sig Dispense Refill   butalbital-acetaminophen-caffeine (FIORICET, ESGIC) 50-325-40 MG per tablet TAKE 1 TABLET BY MOUTH EVERY 6 HOURS AS NEEDED FOR HEADACHE 20 tablet 0   Cholecalciferol (VITAMIN D PO) Take 2,000 Units by mouth.     fluticasone (FLONASE) 50 MCG/ACT nasal spray INHALE 2 SPRASY IN EACH NOSTRIL ONCE DAILY (Patient not taking: Reported on 10/01/2020) 16 g 11   pantoprazole (PROTONIX) 40 MG tablet TAKE 1 TABLET BY MOUTH EVERY DAY (Patient not taking: Reported on 10/01/2020) 90 tablet 3   promethazine (PHENERGAN) 25 MG tablet TAKE 1 TABLET BY MOUTH EVERY 8 HOURS AS NEEDED FOR NAUSEA/VOMITING (Patient not taking: Reported  on 10/01/2020) 20 tablet 0   rizatriptan (MAXALT) 10 MG tablet TAKE 1 TABLET BY MOUTH EVERY DAY AS NEEDED FOR MIGRANE 6 tablet 5   UNITHROID 112 MCG tablet TAKE 1 TABLET BY MOUTH EVERY DAY BEFORE BREAKFAST 30 tablet 1   No current facility-administered medications on file prior to visit.   Allergies  Allergen Reactions   Erythromycin Nausea And Vomiting   Social History   Socioeconomic History   Marital status: Married    Spouse name: Not on file   Number of children: Not on file   Years of education: Not on file   Highest education level: Not on file  Occupational History    Not on file  Tobacco Use   Smoking status: Never   Smokeless tobacco: Never   Tobacco comments:    never used tobacco  Vaping Use   Vaping Use: Never used  Substance and Sexual Activity   Alcohol use: Not Currently    Alcohol/week: 0.0 standard drinks   Drug use: No   Sexual activity: Yes    Birth control/protection: Post-menopausal    Comment: 1st intercourse 55 yo-Fewer than 5 partners  Other Topics Concern   Not on file  Social History Narrative   Not on file   Social Determinants of Health   Financial Resource Strain: Not on file  Food Insecurity: Not on file  Transportation Needs: Not on file  Physical Activity: Not on file  Stress: Not on file  Social Connections: Not on file  Intimate Partner Violence: Not on file      Review of Systems  All other systems reviewed and are negative.      Objective:   Physical Exam Vitals reviewed.  Constitutional:      General: She is not in acute distress.    Appearance: Normal appearance. She is normal weight. She is not ill-appearing, toxic-appearing or diaphoretic.  HENT:     Head: Normocephalic and atraumatic.     Right Ear: Tympanic membrane, ear canal and external ear normal. There is no impacted cerumen.     Left Ear: Tympanic membrane, ear canal and external ear normal. There is no impacted cerumen.     Nose: Congestion and rhinorrhea present.     Right Sinus: Maxillary sinus tenderness and frontal sinus tenderness present.  Eyes:     Conjunctiva/sclera: Conjunctivae normal.  Cardiovascular:     Rate and Rhythm: Normal rate and regular rhythm.     Pulses: Normal pulses.     Heart sounds: Normal heart sounds. No murmur heard.   No friction rub. No gallop.  Pulmonary:     Effort: Pulmonary effort is normal. No respiratory distress.     Breath sounds: Normal breath sounds. No stridor. No wheezing, rhonchi or rales.  Chest:     Chest wall: No tenderness.  Musculoskeletal:     Cervical back: Normal range of  motion.  Lymphadenopathy:     Cervical: No cervical adenopathy.  Neurological:     Mental Status: She is alert.          Assessment & Plan:  Acute maxillary sinusitis, recurrence not specified Try Augmentin 875 mg twice daily for 10 days.  Add Flonase 2 sprays each nostril daily.  Also gave the patient samples of Nurtec.  I explained to her that at the first sign of another debilitating headache, I want her to try 1 of those tablets.  If the headache abates quickly, it would also indicate that potentially some of  her symptoms could be due to underlying migraines.  In all honesty, I feel that the patient may have a combination of issues including sinusitis coupled with possible migraine headaches making diagnosis difficult

## 2020-12-19 ENCOUNTER — Other Ambulatory Visit: Payer: Self-pay | Admitting: Hematology & Oncology

## 2021-01-02 ENCOUNTER — Other Ambulatory Visit: Payer: Self-pay | Admitting: Hematology & Oncology

## 2021-02-08 ENCOUNTER — Encounter: Payer: Self-pay | Admitting: Family Medicine

## 2021-03-04 DIAGNOSIS — M7712 Lateral epicondylitis, left elbow: Secondary | ICD-10-CM | POA: Insufficient documentation

## 2021-03-11 ENCOUNTER — Other Ambulatory Visit: Payer: Self-pay | Admitting: Hematology & Oncology

## 2021-03-11 DIAGNOSIS — Z1231 Encounter for screening mammogram for malignant neoplasm of breast: Secondary | ICD-10-CM

## 2021-04-01 ENCOUNTER — Other Ambulatory Visit: Payer: Self-pay

## 2021-04-01 ENCOUNTER — Ambulatory Visit
Admission: RE | Admit: 2021-04-01 | Discharge: 2021-04-01 | Disposition: A | Discharge status: Home / Self Care | Payer: BC Managed Care – PPO | Source: Ambulatory Visit | Attending: Hematology & Oncology | Admitting: Hematology & Oncology

## 2021-04-01 ENCOUNTER — Inpatient Hospital Stay: Payer: BC Managed Care – PPO | Attending: Hematology & Oncology

## 2021-04-01 ENCOUNTER — Inpatient Hospital Stay: Payer: BC Managed Care – PPO | Admitting: Hematology & Oncology

## 2021-04-01 ENCOUNTER — Encounter: Payer: Self-pay | Admitting: Family Medicine

## 2021-04-01 ENCOUNTER — Ambulatory Visit (INDEPENDENT_AMBULATORY_CARE_PROVIDER_SITE_OTHER): Payer: BC Managed Care – PPO | Admitting: Family Medicine

## 2021-04-01 VITALS — BP 97/71 | HR 57 | Temp 98.3°F | Resp 18 | Wt 141.0 lb

## 2021-04-01 VITALS — BP 118/78 | HR 56 | Temp 97.3°F | Resp 18 | Ht 64.0 in | Wt 141.0 lb

## 2021-04-01 DIAGNOSIS — E039 Hypothyroidism, unspecified: Secondary | ICD-10-CM | POA: Insufficient documentation

## 2021-04-01 DIAGNOSIS — R1900 Intra-abdominal and pelvic swelling, mass and lump, unspecified site: Secondary | ICD-10-CM | POA: Insufficient documentation

## 2021-04-01 DIAGNOSIS — E038 Other specified hypothyroidism: Secondary | ICD-10-CM | POA: Diagnosis not present

## 2021-04-01 DIAGNOSIS — J0100 Acute maxillary sinusitis, unspecified: Secondary | ICD-10-CM

## 2021-04-01 DIAGNOSIS — Z171 Estrogen receptor negative status [ER-]: Secondary | ICD-10-CM

## 2021-04-01 DIAGNOSIS — Z79899 Other long term (current) drug therapy: Secondary | ICD-10-CM | POA: Insufficient documentation

## 2021-04-01 DIAGNOSIS — R0981 Nasal congestion: Secondary | ICD-10-CM | POA: Diagnosis not present

## 2021-04-01 DIAGNOSIS — E7800 Pure hypercholesterolemia, unspecified: Secondary | ICD-10-CM | POA: Diagnosis not present

## 2021-04-01 DIAGNOSIS — Z170 Estrogen receptor positive status [ER+]: Secondary | ICD-10-CM | POA: Diagnosis not present

## 2021-04-01 DIAGNOSIS — Z124 Encounter for screening for malignant neoplasm of cervix: Secondary | ICD-10-CM

## 2021-04-01 DIAGNOSIS — C50911 Malignant neoplasm of unspecified site of right female breast: Secondary | ICD-10-CM | POA: Insufficient documentation

## 2021-04-01 DIAGNOSIS — Z1231 Encounter for screening mammogram for malignant neoplasm of breast: Secondary | ICD-10-CM

## 2021-04-01 DIAGNOSIS — C50611 Malignant neoplasm of axillary tail of right female breast: Secondary | ICD-10-CM

## 2021-04-01 LAB — CBC WITH DIFFERENTIAL (CANCER CENTER ONLY)
Abs Immature Granulocytes: 0 10*3/uL (ref 0.00–0.07)
Basophils Absolute: 0 10*3/uL (ref 0.0–0.1)
Basophils Relative: 0 %
Eosinophils Absolute: 0.1 10*3/uL (ref 0.0–0.5)
Eosinophils Relative: 1 %
HCT: 44.5 % (ref 36.0–46.0)
Hemoglobin: 14.7 g/dL (ref 12.0–15.0)
Immature Granulocytes: 0 %
Lymphocytes Relative: 58 %
Lymphs Abs: 2.9 10*3/uL (ref 0.7–4.0)
MCH: 29.9 pg (ref 26.0–34.0)
MCHC: 33 g/dL (ref 30.0–36.0)
MCV: 90.6 fL (ref 80.0–100.0)
Monocytes Absolute: 0.3 10*3/uL (ref 0.1–1.0)
Monocytes Relative: 6 %
Neutro Abs: 1.7 10*3/uL (ref 1.7–7.7)
Neutrophils Relative %: 35 %
Platelet Count: 200 10*3/uL (ref 150–400)
RBC: 4.91 MIL/uL (ref 3.87–5.11)
RDW: 12.5 % (ref 11.5–15.5)
WBC Count: 5 10*3/uL (ref 4.0–10.5)
nRBC: 0 % (ref 0.0–0.2)

## 2021-04-01 LAB — LIPID PANEL
Cholesterol: 211 mg/dL — ABNORMAL HIGH (ref 0–200)
HDL: 42 mg/dL (ref >40)
LDL Cholesterol: UNDETERMINED mg/dL (ref 0–99)
Total CHOL/HDL Ratio: 5 RATIO
Triglycerides: 404 mg/dL — ABNORMAL HIGH (ref <150)
VLDL: UNDETERMINED mg/dL (ref 0–40)

## 2021-04-01 LAB — CMP (CANCER CENTER ONLY)
ALT: 48 U/L — ABNORMAL HIGH (ref 0–44)
AST: 33 U/L (ref 15–41)
Albumin: 4 g/dL (ref 3.5–5.0)
Alkaline Phosphatase: 90 U/L (ref 38–126)
Anion gap: 5 (ref 5–15)
BUN: 14 mg/dL (ref 6–20)
CO2: 31 mmol/L (ref 22–32)
Calcium: 9.6 mg/dL (ref 8.9–10.3)
Chloride: 104 mmol/L (ref 98–111)
Creatinine: 0.74 mg/dL (ref 0.44–1.00)
GFR, Estimated: >60 mL/min (ref >60)
Glucose, Bld: 96 mg/dL (ref 70–99)
Potassium: 4.8 mmol/L (ref 3.5–5.1)
Sodium: 140 mmol/L (ref 135–145)
Total Bilirubin: 0.2 mg/dL — ABNORMAL LOW (ref 0.3–1.2)
Total Protein: 6.7 g/dL (ref 6.5–8.1)

## 2021-04-01 LAB — LDL CHOLESTEROL, DIRECT: Direct LDL: 112.8 mg/dL — ABNORMAL HIGH (ref 0–99)

## 2021-04-01 LAB — LACTATE DEHYDROGENASE: LDH: 150 U/L (ref 98–192)

## 2021-04-01 MED ORDER — LEVOFLOXACIN 500 MG PO TABS: 500.0000 mg | ORAL_TABLET | Freq: Every day | ORAL | 0 refills | Status: AC

## 2021-04-01 NOTE — Progress Notes (Signed)
Subjective:    Patient ID: Rachel [name], female    DOB: 01/14/1966, 56 y.o.   MRN: [id number] Patient reports that she has been sick for about a week.  She took a negative COVID test last Thursday.  She reports fatigue and malaise.  She reports severe pain in the teeth and in her maxillary sinuses.  She reports postnasal drip.  She is blowing out bloody mucus from her nose.  She is coughing up brown mucus that she feels is draining from her throat.  She denies any chest pain shortness of breath or dyspnea on exertion.  Her mammogram is scheduled for this afternoon.  She is also seeing her oncologist today.  She is overdue for a Pap smear.  She would like me to schedule her to see a gynecologist.  She is also overdue for a bone density test.  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  Prior T score -2.5.  DEXA stable from prior study.   Personal history of chemotherapy 2001   Personal history of radiation therapy 2001   PONV (postoperative nausea and vomiting)    Past Surgical History:  Procedure Laterality Date   BREAST BIOPSY Left    BREAST EXCISIONAL BIOPSY Left 2021   BREAST EXCISIONAL BIOPSY Right    BREAST LUMPECTOMY Right 2001   CESAREAN SECTION     COLPOSCOPY     NASAL SINUS SURGERY Right 02/20/2020   Procedure: ENDOSCOPIC SINUS SURGERY WITH RIGHT  ETHMOIDECTOMY AND RIGHT MAXILLARY OSTIA ENLARGEMENT WITH FUSION;  Surgeon: Rozetta Nunnery, MD;  Location: [facility];  Service: ENT;  Laterality: Right;   PELVIC LAPAROSCOPY     SINUS ENDO WITH FUSION Right 02/20/2020   Procedure: SINUS ENDO WITH FUSION;  Surgeon: Rozetta Nunnery, MD;  Location: [facility];  Service: ENT;  Laterality: Right;   [place]   Current Outpatient Medications on File Prior to Visit  Medication Sig Dispense Refill   butalbital-acetaminophen-caffeine (FIORICET, ESGIC) 50-325-40 MG per tablet TAKE 1 TABLET BY MOUTH EVERY 6 HOURS AS NEEDED FOR HEADACHE 20 tablet 0   Cholecalciferol (VITAMIN D PO) Take 2,000 Units by mouth.     fluticasone (FLONASE) 50 MCG/ACT nasal spray INHALE 2 SPRASY IN EACH NOSTRIL ONCE DAILY 16 g 11   pantoprazole (PROTONIX) 40 MG tablet TAKE 1 TABLET BY MOUTH EVERY DAY 90 tablet 3   promethazine (PHENERGAN) 25 MG tablet TAKE 1 TABLET BY MOUTH EVERY 8 HOURS AS NEEDED FOR NAUSEA/VOMITING 20 tablet 0   rizatriptan (MAXALT) 10 MG tablet TAKE 1 TABLET BY MOUTH EVERY DAY AS NEEDED FOR MIGRANE 6 tablet 5   UNITHROID 112 MCG tablet TAKE 1  TABLET BY MOUTH EVERY DAY BEFORE BREAKFAST 90 tablet 1   No current facility-administered medications on file prior to visit.   Allergies  Allergen Reactions   Erythromycin Nausea And Vomiting   Social History   Socioeconomic History   Marital status: Married    Spouse name: Not on file   Number of children: Not on file   Years of education: Not on file   Highest education level: Not on file  Occupational History   Not on file  Tobacco Use   Smoking status: Never   Smokeless tobacco: Never   Tobacco comments:    never used tobacco  Vaping Use   Vaping Use: Never used  Substance and Sexual Activity   Alcohol use: Not Currently    Alcohol/week: 0.0 standard drinks   Drug use: No   Sexual activity: Yes     Birth control/protection: Post-menopausal    Comment: 1st intercourse 56 yo-Fewer than 5 partners  Other Topics Concern   Not on file  Social History Narrative   Not on file   Social Determinants of Health   Financial Resource Strain: Not on file  Food Insecurity: Not on file  Transportation Needs: Not on file  Physical Activity: Not on file  Stress: Not on file  Social Connections: Not on file  Intimate Partner Violence: Not on file      Review of Systems  All other systems reviewed and are negative.     Objective:   Physical Exam Vitals reviewed.  Constitutional:      General: She is not in acute distress.    Appearance: Normal appearance. She is normal weight. She is not ill-appearing, toxic-appearing or diaphoretic.  HENT:     Head: Normocephalic and atraumatic.     Right Ear: Tympanic membrane, ear canal and external ear normal. There is no impacted cerumen.     Left Ear: Tympanic membrane, ear canal and external ear normal. There is no impacted cerumen.     Nose: Congestion and rhinorrhea present.     Right Sinus: Maxillary sinus tenderness and frontal sinus tenderness present.  Eyes:     Conjunctiva/sclera: Conjunctivae normal.  Cardiovascular:     Rate and Rhythm: Normal rate and regular rhythm.     Pulses: Normal pulses.     Heart sounds: Normal heart sounds. No murmur heard.   No friction rub. No gallop.  Pulmonary:     Effort: Pulmonary effort is normal. No respiratory distress.     Breath sounds: Normal breath sounds. No stridor. No wheezing, rhonchi or rales.  Chest:     Chest wall: No tenderness.  Musculoskeletal:     Cervical back: Normal range of motion.  Lymphadenopathy:     Cervical: No cervical adenopathy.  Neurological:     Mental Status: She is alert.          Assessment & Plan:  Cervical cancer screening - Plan: Ambulatory referral to Gynecology  Acute maxillary sinusitis, recurrence not specified I will treat her sinus  infection with Levaquin 500 mg p.o. daily for 7 days.  I will establish the patient with a gynecologist for her Pap smear per her request.  Her mammogram and her oncology follow-up is scheduled for later today.  She is due for a bone density test but she would like to see a gynecologist and have it all done in 1 office.  She has a history of hyperlipidemia and I strongly recommended she consider a statin which she declines.  Her last LDL was over 170.  I recommended that she at least take fish oil 2000 mg daily

## 2021-04-01 NOTE — Progress Notes (Signed)
Hematology and Oncology Follow Up Visit  Rachel [name] [facility] [id number] March 06, 1965 56 y.o. 04/01/2021   Principle Diagnosis:  Stage I (T1 N0M0) infiltrating ductal carcinoma the right breast  Current Therapy:   Observation     Interim History:  [name] is back for followup.  We see her every 6 months.  It is always nice catching up with her.  Everything is going well with her physically.  Her husband has had problems.  He had a emergency gallbladder surgery back in January.  Thankfully, he got over that.  He also has had problems with his mesenteric "mass.".  He has seen his surgeon for this.  She is just watching this right now.  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  She enjoys this.  Is a vacation today for [place].  She still has high cholesterol.  She does not want to take any type of cholesterol medicine.  She also has hypothyroidism.  We are watching her TSH.  She has had no problems with bowels or bladder.  She has had no cough or shortness of breath.  She has had no rashes.  She has had no problems with weight gain.  She has she has lost weight.  She tried to lose weight and stay active.  Overall, performance status is ECOG 0.    Medications:  Current Outpatient Medications:    butalbital-acetaminophen-caffeine (FIORICET, ESGIC) 50-325-40 MG per tablet, TAKE 1 TABLET BY MOUTH EVERY 6 HOURS AS NEEDED FOR HEADACHE, Disp: 20 tablet, Rfl: 0   Cholecalciferol (VITAMIN D PO), Take 2,000 Units by mouth., Disp: , Rfl:    fluticasone (FLONASE) 50 MCG/ACT nasal spray, INHALE 2 SPRASY IN EACH NOSTRIL ONCE DAILY, Disp: 16 g, Rfl: 11   levofloxacin (LEVAQUIN) 500 MG tablet, Take 1 tablet (500 mg total) by mouth daily for 7 days., Disp: 7 tablet, Rfl: 0   pantoprazole (PROTONIX) 40 MG tablet, TAKE 1 TABLET BY MOUTH EVERY DAY, Disp: 90 tablet, Rfl: 3   promethazine (PHENERGAN) 25 MG tablet, TAKE 1 TABLET BY MOUTH EVERY 8 HOURS AS NEEDED FOR NAUSEA/VOMITING, Disp: 20 tablet, Rfl: 0    rizatriptan (MAXALT) 10 MG tablet, TAKE 1 TABLET BY MOUTH EVERY DAY AS NEEDED FOR MIGRANE, Disp: 6 tablet, Rfl: 5   UNITHROID 112 MCG tablet, TAKE 1 TABLET BY MOUTH EVERY DAY BEFORE BREAKFAST, Disp: 90 tablet, Rfl: 1  Allergies:  Allergies  Allergen Reactions   Erythromycin Nausea And Vomiting    Past Medical History, Surgical history, Social history, and Family History were reviewed and updated.  Review of Systems: Review of Systems  Constitutional: Negative.   HENT:  Positive for congestion.   Eyes: Negative.   Respiratory: Negative.    Cardiovascular: Negative.   Gastrointestinal: Negative.   Genitourinary: Negative.   Musculoskeletal: Negative.   Skin: Negative.   Neurological: Negative.   Endo/Heme/Allergies: Negative.   Psychiatric/Behavioral: Negative.      Physical Exam:  weight is 141 lb (64 kg). Her oral temperature is 98.3 F (36.8 C). Her blood pressure is 97/71 and her pulse is 57 (abnormal). Her respiration is 18 and oxygen saturation is 100%.   Physical Exam Vitals reviewed.  Constitutional:      Comments: Her breast exam shows right breast with no masses, edema or erythema.  She has a well-healed lumpectomy at the 10 o'clock position on the right breast.  No erythema or masses are noted.  There is no right axillary adenopathy.  Left breast shows  no masses, edema or erythema.  The biopsy site is well-healed.  There is no left axillary adenopathy.  HENT:     Head: Normocephalic and atraumatic.  Eyes:     Pupils: Pupils are equal, round, and reactive to light.  Cardiovascular:     Rate and Rhythm: Normal rate and regular rhythm.     Heart sounds: Normal heart sounds.  Pulmonary:     Effort: Pulmonary effort is normal.     Breath sounds: Normal breath sounds.  Abdominal:     General: Bowel sounds are normal.     Palpations: Abdomen is soft.  Musculoskeletal:        General: No tenderness or deformity. Normal range of motion.     Cervical back: Normal  range of motion.  Lymphadenopathy:     Cervical: No cervical adenopathy.  Skin:    General: Skin is warm and dry.     Findings: No erythema or rash.  Neurological:     Mental Status: She is alert and oriented to person, place, and time.  Psychiatric:        Behavior: Behavior normal.        Thought Content: Thought content normal.        Judgment: Judgment normal.     Lab Results  Component Value Date   WBC 5.0 04/01/2021   HGB 14.7 04/01/2021   HCT 44.5 04/01/2021   MCV 90.6 04/01/2021   PLT 200 04/01/2021     Chemistry      Component Value Date/Time   NA 140 04/01/2021 0907   NA 140 09/26/2016 0743   NA 143 09/21/2015 0854   K 4.8 04/01/2021 0907   K 3.8 09/26/2016 0743   K 4.4 09/21/2015 0854   CL 104 04/01/2021 0907   CL 104 09/26/2016 0743   CO2 31 04/01/2021 0907   CO2 30 09/26/2016 0743   CO2 28 09/21/2015 0854   BUN 14 04/01/2021 0907   BUN 14 09/26/2016 0743   BUN 13.9 09/21/2015 0854   CREATININE 0.74 04/01/2021 0907   CREATININE 0.87 01/01/2018 1055   CREATININE 0.8 09/21/2015 0854      Component Value Date/Time   CALCIUM 9.6 04/01/2021 0907   CALCIUM 9.4 09/26/2016 0743   CALCIUM 10.1 09/21/2015 0854   ALKPHOS 90 04/01/2021 0907   ALKPHOS 80 09/26/2016 0743   ALKPHOS 98 09/21/2015 0854   AST 33 04/01/2021 0907   AST 37 (H) 09/21/2015 0854   ALT 48 (H) 04/01/2021 0907   ALT 35 09/26/2016 0743   ALT 57 (H) 09/21/2015 0854   BILITOT 0.2 (L) 04/01/2021 0907   BILITOT 0.53 09/21/2015 0854      Impression and Plan: Rachel [name] is a 56 year old white female with a history of stage I ductal carcinoma of the right breast. She was diagnosed 22 years ago. 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 I  think that  she is cured.  So far, everything is going quite well.    We will see what her cholesterol level is.  We will see what her thyroid level is.  She has not yet seen Dr. Sabra Heck of Gynecology.  We will have to see about getting her in for another  appointment.  We will go ahead with another follow-up in 6 months.  I am sure that she will be quite busy in the 6 months that we see her.  Volanda Napoleon, MD 2/20/202310:43 AM

## 2021-04-01 NOTE — Addendum Note (Signed)
Addended by: Burney Gauze R on: 04/01/2021 11:15 AM   Modules accepted: Orders

## 2021-04-02 ENCOUNTER — Encounter: Payer: Self-pay | Admitting: *Deleted

## 2021-04-03 ENCOUNTER — Other Ambulatory Visit: Payer: Self-pay | Admitting: Hematology & Oncology

## 2021-04-03 DIAGNOSIS — R928 Other abnormal and inconclusive findings on diagnostic imaging of breast: Secondary | ICD-10-CM

## 2021-04-25 ENCOUNTER — Other Ambulatory Visit: Payer: Self-pay | Admitting: Hematology & Oncology

## 2021-04-25 ENCOUNTER — Ambulatory Visit
Admission: RE | Admit: 2021-04-25 | Discharge: 2021-04-25 | Disposition: A | Discharge status: Home / Self Care | Payer: BC Managed Care – PPO | Source: Ambulatory Visit | Attending: Hematology & Oncology | Admitting: Hematology & Oncology

## 2021-05-09 ENCOUNTER — Other Ambulatory Visit (HOSPITAL_COMMUNITY)
Admission: RE | Admit: 2021-05-09 | Discharge: 2021-05-09 | Disposition: A | Discharge status: Home / Self Care | Payer: BC Managed Care – PPO | Source: Ambulatory Visit | Attending: Obstetrics & Gynecology | Admitting: Obstetrics & Gynecology

## 2021-05-09 ENCOUNTER — Encounter (HOSPITAL_BASED_OUTPATIENT_CLINIC_OR_DEPARTMENT_OTHER): Payer: Self-pay | Admitting: Obstetrics & Gynecology

## 2021-05-09 ENCOUNTER — Ambulatory Visit (HOSPITAL_BASED_OUTPATIENT_CLINIC_OR_DEPARTMENT_OTHER): Payer: BC Managed Care – PPO | Admitting: Obstetrics & Gynecology

## 2021-05-09 VITALS — BP 129/59 | HR 55 | Ht 64.0 in | Wt 140.4 lb

## 2021-05-09 DIAGNOSIS — C50611 Malignant neoplasm of axillary tail of right female breast: Secondary | ICD-10-CM

## 2021-05-09 DIAGNOSIS — Z01419 Encounter for gynecological examination (general) (routine) without abnormal findings: Secondary | ICD-10-CM | POA: Diagnosis not present

## 2021-05-09 DIAGNOSIS — Z124 Encounter for screening for malignant neoplasm of cervix: Secondary | ICD-10-CM

## 2021-05-09 DIAGNOSIS — Z171 Estrogen receptor negative status [ER-]: Secondary | ICD-10-CM

## 2021-05-09 DIAGNOSIS — M8588 Other specified disorders of bone density and structure, other site: Secondary | ICD-10-CM | POA: Diagnosis not present

## 2021-05-09 DIAGNOSIS — M818 Other osteoporosis without current pathological fracture: Secondary | ICD-10-CM

## 2021-05-09 DIAGNOSIS — Z780 Asymptomatic menopausal state: Secondary | ICD-10-CM

## 2021-05-09 NOTE — Progress Notes (Signed)
56 y.o. G60P2002 Married White or Caucasian female here for annual exam/new patient exam.  Denies vaginal bleeding.  Her chemotherapy induced menopause.  H/o breast cancer in 2001.  Treated with chemo and radiation.  Cancer was ER/PR positive and Her 2 negative.  Has upcoming breast biopsies due to new breast findings on mammogram.  A little nervous about this. ? ?No LMP recorded (lmp unknown). Patient is postmenopausal.          ?Sexually active: Yes.    ?The current method of family planning is post menopausal status.    ?Smoker:  no ? ?Health Maintenance: ?Pap:  05/10/2019 Mild abnormality, Neg HPV ?History of abnormal Pap:  yes ?MMG:  04/01/2021 Additional imaging ?Colonoscopy:  02/23/2018 ?BMD:   2020 ?Screening Labs: h/o elevated triglycerides ? ? reports that she has never smoked. She has never used smokeless tobacco. She reports that she does not currently use alcohol. She reports that she does not use drugs. ? ?Past Medical History:  ?Diagnosis Date  ? ASCUS of cervix with negative high risk HPV 03/2018  ? Cancer [facility]) 2001  ? 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 Chiari malformation type I ([place])   ? Chronic headache   ? Family history of uterine cancer   ? Hypercholesteremia   ? diet controlled  ? Hypothyroidism   ? on meds  ? Osteoporosis 09/2018  ? T score -2.4.  Prior T score -2.5.  DEXA stable from prior study.  ? Personal history of chemotherapy 2001  ? Personal history of radiation therapy 2001  ? PONV (postoperative nausea and vomiting)   ? ? ?Past Surgical History:  ?Procedure Laterality Date  ? BREAST EXCISIONAL BIOPSY Left 2021  ? BREAST LUMPECTOMY Right 2001  ? [place]  ? NASAL SINUS SURGERY Right 02/20/2020  ? Procedure: ENDOSCOPIC SINUS SURGERY WITH RIGHT ETHMOIDECTOMY AND RIGHT MAXILLARY OSTIA ENLARGEMENT WITH FUSION;  Surgeon: Rozetta Nunnery, MD;  Location: [facility];  Service: ENT;  Laterality: Right;  ? PELVIC LAPAROSCOPY     ? endometriosis  ? SINUS ENDO WITH FUSION Right 02/20/2020  ? Procedure: SINUS ENDO WITH FUSION;  Surgeon: Rozetta Nunnery, MD;  Location: [facility];  Service: ENT;  Laterality: Right;  ? TONSILLECTOMY  1980  ? [place] EXTRACTION  1986  ? ? ?Current Outpatient Medications  ?Medication Sig Dispense Refill  ? butalbital-acetaminophen-caffeine (FIORICET, ESGIC) 50-325-40 MG per tablet TAKE 1 TABLET BY MOUTH EVERY 6 HOURS AS NEEDED FOR HEADACHE 20 tablet 0  ? Cholecalciferol (VITAMIN D PO) Take 2,000 Units by mouth.    ? fluticasone (FLONASE) 50 MCG/ACT nasal spray INHALE 2 SPRASY IN EACH NOSTRIL ONCE DAILY 16 g 11  ? pantoprazole (PROTONIX) 40 MG tablet TAKE 1 TABLET BY MOUTH EVERY DAY 90 tablet 3  ? promethazine (PHENERGAN) 25 MG tablet TAKE 1 TABLET BY MOUTH EVERY 8 HOURS AS NEEDED FOR NAUSEA/VOMITING 20 tablet 0  ? rizatriptan (MAXALT) 10 MG tablet TAKE 1 TABLET BY MOUTH EVERY DAY AS NEEDED FOR MIGRANE 6 tablet 5  ? UNITHROID 112 MCG tablet TAKE 1 TABLET BY MOUTH EVERY DAY BEFORE BREAKFAST 90 tablet 1  ? ?No current facility-administered medications for this visit.  ? ? ?Family History  ?Problem Relation Age of Onset  ? Diabetes Father   ? Uterine cancer Maternal Aunt 60  ? Colon polyps Neg Hx   ? Colon cancer Neg Hx   ? Esophageal  cancer Neg Hx   ? Stomach cancer Neg Hx   ? Rectal cancer Neg Hx   ? ? ?Review of Systems  ?All other systems reviewed and are negative. ? ?Exam:   ?BP (!) 129/59 (BP Location: Right Arm, Patient Position: Sitting, Cuff Size: Normal)   Pulse (!) 55   Ht '5\' 4"'$  (1.626 m) Comment: Reported  Wt 140 lb 6.4 oz (63.7 kg)   LMP  (LMP Unknown)   BMI 24.10 kg/m?   Height: '5\' 4"'$  (162.6 cm) (Reported) ? ?General appearance: alert, cooperative and appears stated age ?Head: Normocephalic, without obvious abnormality, atraumatic ?Neck: no adenopathy, supple, symmetrical, trachea midline and thyroid normal to inspection and palpation ?Lungs: clear to auscultation  bilaterally ?Breasts: normal appearance, no masses or tenderness, well healed scars present on both breasts ?Heart: regular rate and rhythm ?Abdomen: soft, non-tender; bowel sounds normal; no masses,  no organomegaly ?Extremities: extremities normal, atraumatic, no cyanosis or edema ?Skin: Skin color, texture, turgor normal. No rashes or lesions ?Lymph nodes: Cervical, supraclavicular, and axillary nodes normal. ?No abnormal inguinal nodes palpated ?Neurologic: Grossly normal ? ? ?Pelvic: External genitalia:  no lesions ?             Urethra:  normal appearing urethra with no masses, tenderness or lesions ?             Bartholins and Skenes: normal    ?             Vagina: normal appearing vagina with normal color and no discharge, no lesions ?             Cervix: no lesions ?             Pap taken: Yes.   ?Bimanual Exam:  Uterus:  normal size, contour, position, consistency, mobility, non-tender ?             Adnexa: normal adnexa and no mass, fullness, tenderness ?              Rectovaginal: Confirms ?              Anus:  normal sphincter tone, no lesions ? ?Chaperone, Octaviano Batty, CMA, was present for exam. ? ?Assessment/Plan: ?1. Well woman exam with routine gynecological exam ?- Pap smear and HR HPV obtained today ?- Mammogram 04/2021 with biopsies scheduled for 05/17/2021 ?- Colonoscopy 02/23/2018 ?- Bone mineral density ordered ?- lab work done done with PCP ?- vaccines reviewed/updated ? ?2. Cervical cancer screening ?- Cytology - PAP( [place]) ? ?3. Osteopenia of lumbar spine ?- DG MOBILE BONE DENSITY; Future ? ?4. Malignant neoplasm of axillary tail of right breast in female, estrogen receptor negative ([place]) ? ?5. Postmenopausal ?  ? ?

## 2021-05-12 ENCOUNTER — Encounter (HOSPITAL_BASED_OUTPATIENT_CLINIC_OR_DEPARTMENT_OTHER): Payer: Self-pay | Admitting: Obstetrics & Gynecology

## 2021-05-12 LAB — CYTOLOGY - PAP
Adequacy: ABSENT
Comment: NEGATIVE
Diagnosis: NEGATIVE
High risk HPV: NEGATIVE

## 2021-05-17 ENCOUNTER — Ambulatory Visit
Admission: RE | Admit: 2021-05-17 | Discharge: 2021-05-17 | Disposition: A | Discharge status: Home / Self Care | Payer: BC Managed Care – PPO | Source: Ambulatory Visit | Attending: Hematology & Oncology | Admitting: Hematology & Oncology

## 2021-05-17 DIAGNOSIS — R921 Mammographic calcification found on diagnostic imaging of breast: Secondary | ICD-10-CM

## 2021-05-17 DIAGNOSIS — N6310 Unspecified lump in the right breast, unspecified quadrant: Secondary | ICD-10-CM

## 2021-05-20 ENCOUNTER — Encounter: Payer: Self-pay | Admitting: *Deleted

## 2021-05-20 NOTE — Progress Notes (Signed)
This is an established patient, diagnosed with breast cancer 22 years ago with a biopsy proven recurrence in two locations in the right breast.  ? ?Called and spoke to patient. Informed her that Dr Marin Olp usually sees patient's after their surgery, however patient wanted to be seen prior. Scheduled for 05/27/2021. Also spoke ot patient about surgical referral. Dr Marin Olp recommends Dr Barry Dienes and patient confirms. She would like Sula Soda, the [place] to place the referral order. Message sent to her.  ? ?Spoke with patient about genetic referral and at this time she declines.  ? ?Oncology Nurse Navigator Documentation ? ? ?  05/20/2021  ?  1:45 PM  ?Oncology Nurse Navigator Flowsheets  ?Abnormal Finding Date 04/04/2021  ?Confirmed Diagnosis Date 05/17/2021  ?Diagnosis Status Confirmed Diagnosis Complete  ?Navigator Follow Up Date: 05/27/2021  ?Navigator Follow Up Reason: New Patient Appointment  ?Navigator Location [facility]  ?Navigator Encounter Type Introductory Phone Call  ?Patient Visit Type MedOnc  ?Treatment Phase Pre-Tx/Tx Discussion  ?Barriers/Navigation Needs Coordination of Care;Education  ?Education Other  ?Interventions Coordination of Care;Education  ?Acuity Level 2-Minimal Needs (1-2 Barriers Identified)  ?Coordination of Care Appts  ?Education Method Verbal  ?Support Groups/Services Friends and Family  ?Time Spent with Patient 45  ?  ?

## 2021-05-27 ENCOUNTER — Ambulatory Visit: Payer: BC Managed Care – PPO | Admitting: Hematology & Oncology

## 2021-05-27 ENCOUNTER — Other Ambulatory Visit: Payer: BC Managed Care – PPO

## 2021-05-28 ENCOUNTER — Ambulatory Visit: Payer: BC Managed Care – PPO | Admitting: Family Medicine

## 2021-05-28 ENCOUNTER — Encounter: Payer: Self-pay | Admitting: *Deleted

## 2021-05-28 ENCOUNTER — Encounter: Payer: Self-pay | Admitting: Family Medicine

## 2021-05-28 VITALS — BP 108/62 | HR 76 | Temp 97.2°F | Resp 18 | Ht 64.0 in | Wt 140.0 lb

## 2021-05-28 DIAGNOSIS — J0101 Acute recurrent maxillary sinusitis: Secondary | ICD-10-CM | POA: Diagnosis not present

## 2021-05-28 MED ORDER — FLUCONAZOLE 150 MG PO TABS: 150.0000 mg | ORAL_TABLET | Freq: Once | ORAL | 0 refills | Status: AC

## 2021-05-28 MED ORDER — AMOXICILLIN-POT CLAVULANATE 875-125 MG PO TABS: 1.0000 | ORAL_TABLET | Freq: Two times a day (BID) | ORAL | 0 refills | Status: DC

## 2021-05-28 NOTE — Progress Notes (Signed)
? ?Subjective:  ? ? Patient ID: Rachel [name], female    DOB: 03-05-1965, 56 y.o.   MRN: [id number] ? ?Patient has recently discovered that she has a recurrence of her breast cancer.  She is obviously struggling to deal with this.  The biopsy has recently returned positive.  She meets with the surgeon on Monday.  However over the last 2 weeks she has developed a sinus infection.  She reports pain and pressure in her left ear.  She reports pain and pressure in her forehead and in her cheeks.  She is blowing out copious amounts of purulent mucus from her nose.  She is trying Advil Cold and Sinus, Tylenol Cold and sinus, Mucinex DM, allergy medication and has been taking it for the last week with no success.  The headache is getting worse.  She has postnasal drip causing a sore throat.  She is also been exposed to strep. ?Past Medical History:  ?Diagnosis Date  ? ASCUS of cervix with negative high risk HPV 03/2018  ? Cancer [facility]) 2001  ? 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 Chiari malformation type I ([place])   ? Chronic headache   ? Family history of uterine cancer   ? Hypercholesteremia   ? diet controlled  ? Hypothyroidism   ? on meds  ? Osteopenia 09/2018  ? T score -2.4.  Prior T score -2.5.  DEXA stable from prior study.  ? Personal history of chemotherapy 2001  ? Personal history of radiation therapy 2001  ? PONV (postoperative nausea and vomiting)   ? ?Past Surgical History:  ?Procedure Laterality Date  ? BREAST EXCISIONAL BIOPSY Left 2021  ? BREAST LUMPECTOMY Right 2001  ? [place]  ? NASAL SINUS SURGERY Right 02/20/2020  ? Procedure: ENDOSCOPIC SINUS SURGERY WITH RIGHT ETHMOIDECTOMY AND RIGHT MAXILLARY OSTIA ENLARGEMENT WITH FUSION;  Surgeon: Rozetta Nunnery, MD;  Location: [facility];  Service: ENT;  Laterality: Right;  ? PELVIC LAPAROSCOPY    ? endometriosis  ? SINUS ENDO WITH FUSION Right 02/20/2020  ? Procedure: SINUS ENDO WITH FUSION;  Surgeon:  Rozetta Nunnery, MD;  Location: [place];  Service: ENT;  Laterality: Right;  ? TONSILLECTOMY  1980  ? [place] EXTRACTION  1986  ? ?Current Outpatient Medications on File Prior to Visit  ?Medication Sig Dispense Refill  ? butalbital-acetaminophen-caffeine (FIORICET, ESGIC) 50-325-40 MG per tablet TAKE 1 TABLET BY MOUTH EVERY 6 HOURS AS NEEDED FOR HEADACHE 20 tablet 0  ? Cholecalciferol (VITAMIN D PO) Take 2,000 Units by mouth.    ? fluticasone (FLONASE) 50 MCG/ACT nasal spray INHALE 2 SPRASY IN EACH NOSTRIL ONCE DAILY 16 g 11  ? pantoprazole (PROTONIX) 40 MG tablet TAKE 1 TABLET BY MOUTH EVERY DAY 90 tablet 3  ? promethazine (PHENERGAN) 25 MG tablet TAKE 1 TABLET BY MOUTH EVERY 8 HOURS AS NEEDED FOR NAUSEA/VOMITING 20 tablet 0  ? rizatriptan (MAXALT) 10 MG tablet TAKE 1 TABLET BY MOUTH EVERY DAY AS NEEDED FOR MIGRANE 6 tablet 5  ? UNITHROID 112 MCG tablet TAKE 1 TABLET BY MOUTH EVERY DAY BEFORE BREAKFAST 90 tablet 1  ? ?No current facility-administered medications on file prior to visit.  ? ?Allergies  ?Allergen Reactions  ? Erythromycin Nausea And Vomiting  ? ?Social History  ? ?Socioeconomic History  ? Marital status: Married  ?  Spouse name: Not on file  ? Number of children: Not on file  ?  Years of education: Not on file  ? Highest education level: Not on file  ?Occupational History  ? Not on file  ?Tobacco Use  ? Smoking status: Never  ? Smokeless tobacco: Never  ? Tobacco comments:  ?  never used tobacco  ?Vaping Use  ? Vaping Use: Never used  ?Substance and Sexual Activity  ? Alcohol use: Not Currently  ?  Alcohol/week: 0.0 standard drinks  ? Drug use: No  ? Sexual activity: Yes  ?  Birth control/protection: Post-menopausal  ?  Comment: 1st intercourse 56 yo-Fewer than 5 partners  ?Other Topics Concern  ? Not on file  ?Social History Narrative  ? Not on file  ? ?Social Determinants of Health  ? ?Financial Resource Strain: Not on file  ?Food Insecurity: Not on file  ?Transportation  Needs: Not on file  ?Physical Activity: Not on file  ?Stress: Not on file  ?Social Connections: Not on file  ?Intimate Partner Violence: Not on file  ? ? ? ? ?Review of Systems  ?All other systems reviewed and are negative. ? ?   ?Objective:  ? Physical Exam ?Vitals reviewed.  ?Constitutional:   ?   General: She is not in acute distress. ?   Appearance: Normal appearance. She is normal weight. She is not ill-appearing, toxic-appearing or diaphoretic.  ?HENT:  ?   Head: Normocephalic and atraumatic.  ?   Right Ear: Tympanic membrane, ear canal and external ear normal. There is no impacted cerumen.  ?   Left Ear: Tympanic membrane, ear canal and external ear normal. There is no impacted cerumen.  ?   Nose: Congestion and rhinorrhea present.  ?   Right Turbinates: Swollen.  ?   Left Turbinates: Swollen.  ?   Right Sinus: Maxillary sinus tenderness and frontal sinus tenderness present.  ?   Left Sinus: Maxillary sinus tenderness and frontal sinus tenderness present.  ?   Mouth/Throat:  ?   Pharynx: No oropharyngeal exudate or posterior oropharyngeal erythema.  ?Eyes:  ?   Conjunctiva/sclera: Conjunctivae normal.  ?Cardiovascular:  ?   Rate and Rhythm: Normal rate and regular rhythm.  ?   Pulses: Normal pulses.  ?   Heart sounds: Normal heart sounds. No murmur heard. ?  No friction rub. No gallop.  ?Pulmonary:  ?   Effort: Pulmonary effort is normal. No respiratory distress.  ?   Breath sounds: Normal breath sounds. No stridor. No wheezing, rhonchi or rales.  ?Chest:  ?   Chest wall: No tenderness.  ?Musculoskeletal:  ?   Cervical back: Normal range of motion.  ?Lymphadenopathy:  ?   Cervical: No cervical adenopathy.  ?Neurological:  ?   Mental Status: She is alert.  ? ? ? ? ? ?   ?Assessment & Plan:  ?Acute recurrent maxillary sinusitis ?Patient clearly has a sinus infection.  Begin Augmentin 875 mg p.o. twice daily for 10 days.  I did give her Diflucan in case she gets a yeast infection afterwards.  I explained the  presence of Sudafed and several of the products she is taking and recommended that she only take Advil Cold and Sinus to avoid polypharmacy and taking excessive amounts of phenylephrine and pseudoephedrine.  We also discussed her recent diagnosis of breast cancer.   ? ?

## 2021-05-28 NOTE — Progress Notes (Signed)
Patient was scheduled for new patient appointment yesterday but the appointment was cancelled. Dr Marin Olp had reached out to the patient over the phone and answered all her questions. Patient would now like to be seen after her surgery.  ? ?Scheduled to see Dr Barry Dienes on 06/03/21 ? ?Oncology Nurse Navigator Documentation ? ? ?  05/28/2021  ?  8:15 AM  ?Oncology Nurse Navigator Flowsheets  ?Navigator Follow Up Date: 06/03/2021  ?Navigator Follow Up Reason: Review Note  ?Navigator Location [facility]  ?Navigator Encounter Type Appt/Treatment Plan Review  ?Patient Visit Type MedOnc  ?Treatment Phase Pre-Tx/Tx Discussion  ?Barriers/Navigation Needs Coordination of Care;Education  ?Interventions None Required  ?Acuity Level 2-Minimal Needs (1-2 Barriers Identified)  ?Support Groups/Services Friends and Family  ?Time Spent with Patient 15  ?  ?

## 2021-05-30 ENCOUNTER — Ambulatory Visit
Admission: RE | Admit: 2021-05-30 | Discharge: 2021-05-30 | Disposition: A | Discharge status: Home / Self Care | Payer: BC Managed Care – PPO | Source: Ambulatory Visit | Attending: Obstetrics & Gynecology | Admitting: Obstetrics & Gynecology

## 2021-05-30 DIAGNOSIS — M8588 Other specified disorders of bone density and structure, other site: Secondary | ICD-10-CM

## 2021-06-03 ENCOUNTER — Encounter: Payer: Self-pay | Admitting: *Deleted

## 2021-06-03 ENCOUNTER — Other Ambulatory Visit: Payer: Self-pay | Admitting: General Surgery

## 2021-06-03 NOTE — Progress Notes (Signed)
Patient seen by [facility] today. Plan for mastectomy. She has been referred to plastics for consideration of reconstruction.  ? ?Oncology Nurse Navigator Documentation ? ? ?  06/03/2021  ? 12:30 PM  ?Oncology Nurse Navigator Flowsheets  ?Navigator Follow Up Date: 06/05/2021  ?Navigator Follow Up Reason: Review Note  ?Navigator Location [facility]  ?Navigator Encounter Type Appt/Treatment Plan Review  ?Patient Visit Type MedOnc  ?Treatment Phase Pre-Tx/Tx Discussion  ?Barriers/Navigation Needs Coordination of Care;Education  ?Interventions None Required  ?Acuity Level 2-Minimal Needs (1-2 Barriers Identified)  ?Support Groups/Services Friends and Family  ?Time Spent with Patient 15  ?  ?

## 2021-06-06 ENCOUNTER — Encounter: Payer: Self-pay | Admitting: *Deleted

## 2021-06-06 NOTE — Progress Notes (Signed)
Patient had her discussion with plastics. She was given information and will need to make decision on desired procedure. Dr Iran Planas  will then notify Dr Barry Dienes and surgery will be scheduled.  ? ?Oncology Nurse Navigator Documentation ? ? ?  06/06/2021  ? 12:45 PM  ?Oncology Nurse Navigator Flowsheets  ?Navigator Follow Up Date: 06/12/2021  ?Navigator Follow Up Reason: Appointment Review  ?Navigator Location [facility]  ?Navigator Encounter Type Appt/Treatment Plan Review  ?Patient Visit Type MedOnc  ?Treatment Phase Pre-Tx/Tx Discussion  ?Barriers/Navigation Needs Coordination of Care;Education  ?Interventions None Required  ?Acuity Level 2-Minimal Needs (1-2 Barriers Identified)  ?Support Groups/Services Friends and Family  ?Time Spent with Patient 15  ?  ?

## 2021-06-11 ENCOUNTER — Other Ambulatory Visit: Payer: Self-pay | Admitting: *Deleted

## 2021-06-11 DIAGNOSIS — Z171 Estrogen receptor negative status [ER-]: Secondary | ICD-10-CM

## 2021-06-12 ENCOUNTER — Encounter: Payer: Self-pay | Admitting: *Deleted

## 2021-06-12 NOTE — Progress Notes (Signed)
Patient scheduled for R mastectomy with SLNB on 5/16. She is deferring on reconstruction at this time.  ? ?Oncology Nurse Navigator Documentation ? ? ?  06/12/2021  ?  8:00 AM  ?Oncology Nurse Navigator Flowsheets  ?Phase of Treatment Surgery  ?Expected Surgery Date 06/25/2021  ?Navigator Follow Up Date: 06/25/2021  ?Navigator Follow Up Reason: Surgery  ?Navigator Location [facility]  ?Navigator Encounter Type Appt/Treatment Plan Review  ?Patient Visit Type MedOnc  ?Treatment Phase Pre-Tx/Tx Discussion  ?Barriers/Navigation Needs Coordination of Care;Education  ?Interventions None Required  ?Acuity Level 2-Minimal Needs (1-2 Barriers Identified)  ?Support Groups/Services Friends and Family  ?Time Spent with Patient 15  ?  ?

## 2021-06-17 ENCOUNTER — Encounter: Payer: Self-pay | Admitting: Rehabilitation

## 2021-06-17 ENCOUNTER — Ambulatory Visit: Payer: BC Managed Care – PPO | Attending: General Surgery | Admitting: Rehabilitation

## 2021-06-17 ENCOUNTER — Other Ambulatory Visit (HOSPITAL_BASED_OUTPATIENT_CLINIC_OR_DEPARTMENT_OTHER): Payer: BC Managed Care – PPO

## 2021-06-17 ENCOUNTER — Other Ambulatory Visit: Payer: Self-pay

## 2021-06-17 ENCOUNTER — Encounter (HOSPITAL_BASED_OUTPATIENT_CLINIC_OR_DEPARTMENT_OTHER): Payer: Self-pay | Admitting: General Surgery

## 2021-06-17 DIAGNOSIS — C50611 Malignant neoplasm of axillary tail of right female breast: Secondary | ICD-10-CM | POA: Diagnosis present

## 2021-06-17 DIAGNOSIS — R293 Abnormal posture: Secondary | ICD-10-CM | POA: Insufficient documentation

## 2021-06-17 DIAGNOSIS — Z171 Estrogen receptor negative status [ER-]: Secondary | ICD-10-CM | POA: Diagnosis present

## 2021-06-17 MED ORDER — ENSURE PRE-SURGERY PO LIQD: 296.0000 mL | Freq: Once | ORAL | Status: DC

## 2021-06-17 NOTE — Therapy (Signed)
?OUTPATIENT PHYSICAL THERAPY BREAST CANCER BASELINE EVALUATION ? ? ?Patient Name: Rachel [name] ?MRN: [id number] ?DOB:[birth year], 56 y.o., female ?Today's Date: 06/17/2021 ? ? PT End of Session - 06/17/21 0948   ? ? Visit Number 1   ? Number of Visits 2   ? Date for PT Re-Evaluation 08/12/21   ? PT Start Time [phone]   ? PT Stop Time 0940   ? PT Time Calculation (min) 37 min   ? Activity Tolerance Patient tolerated treatment well   ? Behavior During Therapy [facility] for tasks assessed/performed   ? ?  ?  ? ?  ? ? ?Past Medical History:  ?Diagnosis Date  ? ASCUS of cervix with negative high risk HPV 03/2018  ? Cancer [name]) 2001  ? 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 Chiari malformation type I ([place])   ? Chronic headache   ? Family history of uterine cancer   ? Hypercholesteremia   ? diet controlled  ? Hypothyroidism   ? on meds  ? Osteopenia 09/2018  ? T score -2.4.  Prior T score -2.5.  DEXA stable from prior study.  ? Personal history of chemotherapy 2001  ? Personal history of radiation therapy 2001  ? PONV (postoperative nausea and vomiting)   ? ?Past Surgical History:  ?Procedure Laterality Date  ? BREAST EXCISIONAL BIOPSY Left 2021  ? BREAST LUMPECTOMY Right 2001  ? [place]  ? NASAL SINUS SURGERY Right 02/20/2020  ? Procedure: ENDOSCOPIC SINUS SURGERY WITH RIGHT ETHMOIDECTOMY AND RIGHT MAXILLARY OSTIA ENLARGEMENT WITH FUSION;  Surgeon: Rozetta Nunnery, MD;  Location: [place];  Service: ENT;  Laterality: Right;  ? PELVIC LAPAROSCOPY    ? endometriosis  ? SINUS ENDO WITH FUSION Right 02/20/2020  ? Procedure: SINUS ENDO WITH FUSION;  Surgeon: Rozetta Nunnery, MD;  Location: [place];  Service: ENT;  Laterality: Right;  ? TONSILLECTOMY  1980  ? [place] [name] EXTRACTION  1986  ? ?Patient Active Problem List  ? Diagnosis Date Noted  ? Lateral epicondylitis, left elbow 03/04/2021  ? Left wrist pain 06/24/2019  ? Pain in joint of left  shoulder 06/24/2019  ? Pain in joint of left elbow 06/24/2019  ? Family history of uterine cancer   ? 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 Breast CA ([place]) 03/31/2011  ? ASCUS (atypical squamous cells of undetermined significance) on Pap smear   ? Cancer [facility])   ? Hypothyroidism   ? Osteopenia   ? Hypercholesteremia   ? ? ?PCP: Jenna Luo, MD ? ?REFERRING PROVIDER: Dr. Barry Dienes ? 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 ?ONSET DATE: 06/03/21 ? ?SUBJECTIVE                                                                                                                                                                                          ? ?  SUBJECTIVE STATEMENT: ?Patient reports she is here today to be seen by her medical team for her newly diagnosed right breast cancer.  ? ?PERTINENT HISTORY:  ?Patient was diagnosed on 06/03/21 with right breast cancer recurrence. Initial lumpectomy, SLNB x 3, radiation, chemotherapy, and antiestrogens. Will be having Rt mastectomy on 06/25/21 with Dr. Barry Dienes with another SLNB.  Other hx: chiari malformation  ? ?PATIENT GOALS   reduce lymphedema risk and learn post op HEP.  ? ?PAIN:  ?Are you having pain? No ? ? ?PRECAUTIONS: Active CA  ? ?HAND DOMINANCE: right ? ?WEIGHT BEARING RESTRICTIONS No ? ?FALLS:  ?Has patient fallen in last 6 months? No ? ?LIVING ENVIRONMENT: ?Patient lives with: husband  ? ?OCCUPATION: assistant principal [employer] ? ?LEISURE: nothing extra - but gets a few miles per day walking the school campus ? ?PRIOR LEVEL OF FUNCTION: Independent ? ? ?OBJECTIVE ? ?COGNITION: ? Overall cognitive status: Within functional limits for tasks assessed   ? ?POSTURE:  ?Forward  head and rounded shoulders posture ? ?UPPER EXTREMITY AROM/PROM: ? ?A/PROM RIGHT  06/17/2021 ?  ?Shoulder extension 53  ?Shoulder flexion 170  ?Shoulder abduction 170  ?Shoulder internal rotation   ?Shoulder external rotation 90  ?  (Blank rows = not tested) ? ?CERVICAL AROM: ?All within normal limits:  ? ?LYMPHEDEMA ASSESSMENTS:  ? ?[place] RIGHT  06/17/2021  ?10 cm proximal to olecranon process 36.5  ?Olecranon process 23.5  ?10 cm proximal to ulnar styloid process 20.6  ?Just proximal to ulnar styloid process 15.3  ?Across hand at thumb web space 18  ?At base of 2nd digit 5.8  ?(Blank rows = not tested) ? ?L-DEX LYMPHEDEMA SCREENING: ? ?The patient was assessed using the L-Dex machine today to produce a lymphedema index baseline score. The patient will be reassessed on a regular basis (typically every 3 months) to obtain new L-Dex scores. If the score is > 6.5 points away from his/her baseline score indicating onset of subclinical lymphedema, it will be recommended to wear a compression garment for 4 weeks, 12 hours per day and then be reassessed. If the score continues to be > 6.5 points from baseline at reassessment, we will initiate lymphedema treatment. Assessing in this manner has a 95% rate of preventing clinically significant lymphedema. ? ? ?QUICK DASH SURVEY:0% ? ? ?PATIENT EDUCATION:  ?Education details: Lymphedema risk reduction and post op shoulder/posture HEP ?Person educated: Patient ?Education method: Explanation, Demonstration, Handout ?Education comprehension: Patient verbalized understanding and returned demonstration ? ? ?HOME EXERCISE PROGRAM: ?Patient was instructed today in a home exercise program today for post op shoulder range of motion. These included active assist shoulder flexion in sitting, scapular retraction, wall walking with shoulder abduction, and hands behind head external rotation.  She was encouraged to do these twice a day, holding 3 seconds and repeating 5 times when permitted  by her physician. ? ? ?ASSESSMENT: ? ?CLINICAL IMPRESSION: ?Pt will have a Rt mastectomy without reconstruction due to past hx of radiation. Pt has a few appts scheduled to check in with a few specialists in [place] and [place] about options.  She will benefit from a post op PT reassessment to determine needs and from L-Dex screens every 3 months for 2 years to detect subclinical lymphedema. ? ?Pt will benefit from skilled therapeutic intervention to improve on the following deficits: Decreased knowledge of precautions, impaired UE functional use, pain, decreased ROM, postural dysfunction.  ? ?PT treatment/interventions: ADL/self-care home management, pt/family education, therapeutic exercise ? ?REHAB POTENTIAL:  Excellent ? ?CLINICAL DECISION MAKING: Stable/uncomplicated ? ?EVALUATION COMPLEXITY: Low ? ? ?GOALS: ?Goals reviewed with patient? YES ? ?LONG TERM GOALS: (STG=LTG) ? ? Name Target Date Goal status  ?1 Pt will be able to verbalize understanding of pertinent lymphedema risk reduction practices relevant to her dx specifically related to skin care.  ?Baseline:  No knowledge 06/17/2021 Achieved at eval  ?2 Pt will be able to return demo and/or verbalize understanding of the post op HEP related to regaining shoulder ROM. ?Baseline:  No knowledge 06/17/2021 Achieved at eval  ?3 Pt will be able to verbalize understanding of the importance of attending the post op After Breast CA Class for further lymphedema risk reduction education and therapeutic exercise.  ?Baseline:  No knowledge 06/17/2021 Achieved at eval  ?4 Pt will demo she has regained full shoulder ROM and function post operatively compared to baselines.  ?Baseline: See objective measurements taken today. 08/12/21   ? ? ? ?PLAN: ?PT FREQUENCY/DURATION: EVAL and 1 follow up appointment.  ? ?PLAN FOR NEXT SESSION: will reassess 3-4 weeks post op to determine needs. ?  ?Patient will follow up at outpatient cancer rehab 3-4 weeks following surgery.  If the patient  requires physical therapy at that time, a specific plan will be dictated and sent to the referring physician for approval. ?The patient was educated today on appropriate basic range of motion exercises to be

## 2021-06-17 NOTE — Progress Notes (Signed)

## 2021-06-24 NOTE — Anesthesia Preprocedure Evaluation (Addendum)
Anesthesia Evaluation  ?Patient identified by MRN, date of birth, ID band ?Patient awake ? ? ? ?Reviewed: ?Allergy & Precautions, NPO status , Patient's Chart, lab work & pertinent test results ? ?History of Anesthesia Complications ?(+) PONV and history of anesthetic complications ? ?Airway ?Mallampati: II ? ?TM Distance: >3 FB ?Neck ROM: Full ? ? ? Dental ?no notable dental hx. ?(+) Teeth Intact, Dental Advisory Given ?  ?Pulmonary ? ?  ?Pulmonary exam normal ?breath sounds clear to auscultation ? ? ? ? ? ? Cardiovascular ?Exercise Tolerance: Good ?Normal cardiovascular exam ?Rhythm:Regular Rate:Normal ? ? ?  ?Neuro/Psych ? Headaches, negative psych ROS  ? GI/Hepatic ?Neg liver ROS,   ?Endo/Other  ?Hypothyroidism  ? Renal/GU ?  ? ?  ?Musculoskeletal ? ?(+) Arthritis ,  ? Abdominal ?  ?Peds ? Hematology ?  ?Anesthesia Other Findings ?R Breast CA ? ?All: erythromycin  ? Reproductive/Obstetrics ? ?  ? ? ? ? ? ? ? ? ? ? ? ? ? ?  ?  ? ? ? ? ? ? ? ?Anesthesia Physical ?Anesthesia Plan ? ?ASA: 2 ? ?Anesthesia Plan: General  ? ?Post-op Pain Management: Dilaudid IV and Tylenol PO (pre-op)*  ? ?Induction: Intravenous ? ?PONV Risk Score and Plan: 4 or greater and Midazolam, Ondansetron, Treatment may vary due to age or medical condition, Scopolamine patch - Pre-op and Dexamethasone ? ?Airway Management Planned: LMA ? ?Additional Equipment: None ? ?Intra-op Plan:  ? ?Post-operative Plan:  ? ?Informed Consent: I have reviewed the patients History and Physical, chart, labs and discussed the procedure including the risks, benefits and alternatives for the proposed anesthesia with the patient or authorized representative who has indicated his/her understanding and acceptance.  ? ? ? ?Dental advisory given ? ?Plan Discussed with:  ? ?Anesthesia Plan Comments: ([place])  ? ? ? ? ? ?Anesthesia Quick Evaluation ? ?

## 2021-06-24 NOTE — H&P (Signed)
?REFERRING PHYSICIAN:  Ennever ?  ?PROVIDER:  Georgianne Fick, MD ?  ?Care Team: Patient Care Team: ?Vivi Barrack, MD as PCP - General (Family Medicine) ?Volanda Napoleon, MD (Hematology and Oncology) ?Georgianne Fick, MD as Consulting Provider (Surgical Oncology) ?Megan Salon, MD (Obstetrics and Gynecology)  ?  ?MRN: [id number] ?DOB: 04-15-1965 ?DATE OF ENCOUNTER: 06/03/2021 ?  ?Subjective  ?  ?Chief Complaint: Breast Cancer ?  ?  ?  ?History of Present Illness: ?Rachel [name] is a 56 y.o. female who is seen today as an office consultation at the request of Dr. Marin Olp for evaluation of Breast Cancer ?  ?Patient is a lovely 56 year old female who presents with recurrent breast cancer March 2023.  She had stage I right breast cancer in 2002.  She had a lumpectomy and sentinel lymph node biopsy with Dr. Zettie Pho followed by chemotherapy and radiation.  She had adjuvant tamoxifen as well.  She has been keeping up with her mammograms religiously.  Unfortunately, on her March mammogram she was found to have right breast asymmetry and calcifications. ?  ?She had a 7 mm retroareolar mass as well as a persistent area of asymmetry in the lower inner quadrant on the right.  There were some upper central calcifications.  Ultrasound confirmed the retroareolar mass as being 6 mm and the lower inner quadrant region being 5 mm.  The calcifications were not seen on the ultrasound.  The axilla was negative on ultrasound.  She had biopsies of all 3 areas.  The calcifications were benign and concordant.  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  The lower inner quadrant mass was also a grade 2 invasive ductal carcinoma that was ER/PR positive, HER2 negative, Ki-67 of 15%. ?  ?She has no family history of malignancy other than an aunt with HPV associated cervical cancer and a great uncle with lung cancer who is a lifetime smoker.  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  She has adult children who do not live with her and lives with her husband who is present at the appointment. ?  ?  ?Diagnostic mammogram/us 04/25/21 [facility] ?ACR Breast Density Category b: There are scattered areas of ?fibroglandular density. ?  ?FINDINGS: ?Mammogram: ?  ?Right breast: Spot compression tomosynthesis views of the right ?breast were performed demonstrating persistence of a small oval mass ?in the retroareolar inferior breast measuring 0.7 cm. Additionally ?there is persistence of an asymmetry in the lower inner right breast ?posterior depth. Spot 2D magnification views were performed ?demonstrating persistence of a tiny group of coarse calcifications ?in the upper central right breast posterior depth spanning 0.3 cm. ?  ?Ultrasound: ?  ?Targeted ultrasound performed in the right breast at 5 o'clock 3 cm ?from the nipple demonstrating an irregular hypoechoic mass measuring ?0.5 x 0.4 x 0.5 cm. This likely corresponds to the asymmetry ?identified mammographically. At 6 o'clock retroareolar there is an ?irregular hypoechoic mass measuring 0.6 x 0.4 x 0.6 cm. This likely ?corresponds to the second area identified mammographically. ?  ?Targeted ultrasound the right axilla demonstrates normal lymph ?nodes. ?  ?IMPRESSION: ?1. Indeterminate masses in the right breast at 5 o'clock and 6 ?o'clock retroareolar. ?  ?2. Indeterminate tiny group of calcifications in the superior ?central right breast. ?  ?RECOMMENDATION: ?1.  Ultrasound-guided core needle biopsy x2 of the right breast. ?  ?2.  Stereotactic core  needle biopsy x1 of the right breast. ?  ?I have discussed the findings and recommendations with the patient. ?The patient will be contacted by our scheduler tomorrow to arrange ?the biopsy appointment. ?  ?BI-RADS CATEGORY  4: Suspicious. ?  ?Pathology core needle biopsy: 05/17/2021 ?1. Breast, right, needle core biopsy, 5 o'clock, 3cmfn, ribbon clip ?INVASIVE  DUCTAL CARCINOMA. ?THE LINEAR EXTENT OF CARCINOMA IN THE LONGEST CORE: 7MM. ?NOTTINGHAM SCORE: 3+2+1, GRADE 2. ?THERE IS NO ACCOMPANYING COMPONENT OF DCIS. ?NEGATIVE FOR LYMPHOVASCULAR INVASION AND TUMOR NECROSIS. ?  ?  ?2. Breast, right, needle core biopsy, 6 o'clock, retroareolar, coil clip ?INVASIVE DUCTAL CARCINOMA WITH A SMALL MUCINOUS COMPONENT. ?THE LINEAR EXTENT OF CARCINOMA IN THE LONGEST CORE: 8 MM. ?NOTTINGHAM SCORE: 3+3+1, GRADE 2. ?THERE IS NO ACCOMPANYING COMPONENT OF DCIS. ?NEGATIVE FOR TUMOR NECROSIS AND LYMPHOVASCULAR INVASION. ?  ?  ?3. Breast, right, needle core biopsy, superior central, x clip ?HYALINIZED FIBROADENOMA WITH DYSTROPHIC MICROCALCIFICATIONS. ?NEGATIVE FOR ADH, ALH, DCIS AND INVASIVE CARCINOMA. ?  ?Receptors: ?1. 5 o'clock ?Estrogen Receptor: >95%, POSITIVE, STRONG STAINING INTENSITY ?Progesterone Receptor: 20%, POSITIVE, MODERATE/STRONG STAINING INTENSITY ?GROUP 5: HER2 **NEGATIVE** ?Proliferation Marker Ki67: 15% ?  ?2. retroareolar  ?Estrogen Receptor: >95%, POSITIVE, STRONG STAINING INTENSITY ?Progesterone Receptor: 60%, POSITIVE, STRONG STAINING INTENSITY ?GROUP 5: HER2 **NEGATIVE** ?Proliferation Marker Ki67: 30% ?Review of Systems: ?A complete review of systems was obtained from the patient.  I have reviewed this information and discussed as appropriate with the patient.  See HPI as well for other ROS. ?  ?Review of Systems  ?All other systems reviewed and are negative. ?  ?  ?  ?Medical History: ?Past Medical History  ?    ?Past Medical History:  ?Diagnosis Date  ? History of cancer    ? Thyroid disease    ?  ?  ?  ?   ?Patient Active Problem List  ?Diagnosis  ? Cancer of overlapping sites of right breast (CMS-HCC)  ?  ?  ?Past Surgical History  ?     ?Past Surgical History:  ?Procedure Laterality Date  ? [place]  ? MASTECTOMY PARTIAL / LUMPECTOMY N/A 2000  ?  ?  ?  ?Allergies  ?    ?Allergies  ?Allergen Reactions  ? Erythromycin Nausea And Vomiting  ?  ?  ?  ?       ?Current Outpatient Medications on File Prior to Visit  ?Medication Sig Dispense Refill  ? levothyroxine (UNITHROID) 112 MCG tablet Unithroid 112 mcg tablet ? TAKE 1 TABLET BY MOUTH EVERY DAY BEFORE BREAKFAST      ?  ?No current facility-administered medications on file prior to visit.  ?  ?  ?Family History  ?     ?Family History  ?Problem Relation Age of Onset  ? Diabetes Father    ?  ?  ?  ?Social History  ?  ?   ?Tobacco Use  ?Smoking Status Never  ?Smokeless Tobacco Never  ?  ?  ?Social History  ?Social History  ?  ?    ?Socioeconomic History  ? Marital status: Married  ?Tobacco Use  ? Smoking status: Never  ? Smokeless tobacco: Never  ?Vaping Use  ? Vaping Use: Never used  ?Substance and Sexual Activity  ? Alcohol use: Never  ? Drug use: Never  ?  ?  ?  ?Objective:  ?  ?  ?   ?Vitals:  ?  06/03/21 0957  ?BP: 126/80  ?Pulse: 76  ?Temp:  36.9 ?C (98.4 ?F)  ?SpO2: 97%  ?Weight: 62.7 kg (138 lb 4 oz)  ?Height: 162.6 cm (5' 4")  ?  ?Body mass index is 23.73 kg/m?. ?  ?  ?  ?Gen:  No acute distress.  Well nourished and well groomed.   ?Neurological: Alert and oriented to person, place, and time. Coordination normal.  ?Head: Normocephalic and atraumatic.  ?Eyes: Conjunctivae are normal. Pupils are equal, round, and reactive to light. No scleral icterus.  ?Neck: Normal range of motion. Neck supple. No tracheal deviation or thyromegaly present.  ?Cardiovascular: Normal rate, regular rhythm, normal heart sounds and intact distal pulses.  Exam reveals no gallop and no friction rub.  No murmur heard. ?Breast: Right breast is smaller than the left.  There is a contour defect in the lower outer quadrant where the incision is.  There are no palpable masses.  No nipple retraction or nipple discharge.  She has no skin dimpling.  No lymphadenopathy.  Some tenderness in the upper outer quadrant at one of the needle insertion sites from the biopsy.  Left breast is benign. ?Respiratory: Effort normal.  No respiratory distress.  No chest wall tenderness. Breath sounds normal.  No wheezes, rales or rhonchi.  ?GI: Soft. Bowel sounds are normal. The abdomen is soft and nontender.  There is no rebound and no guarding.  ?Musculos

## 2021-06-25 ENCOUNTER — Ambulatory Visit (HOSPITAL_BASED_OUTPATIENT_CLINIC_OR_DEPARTMENT_OTHER)
Admission: RE | Admit: 2021-06-25 | Discharge: 2021-06-26 | Disposition: A | Discharge status: Home / Self Care | Payer: BC Managed Care – PPO | Attending: General Surgery | Admitting: General Surgery

## 2021-06-25 ENCOUNTER — Encounter: Payer: Self-pay | Admitting: *Deleted

## 2021-06-25 ENCOUNTER — Other Ambulatory Visit: Payer: Self-pay

## 2021-06-25 ENCOUNTER — Ambulatory Visit (HOSPITAL_BASED_OUTPATIENT_CLINIC_OR_DEPARTMENT_OTHER): Payer: BC Managed Care – PPO | Admitting: Anesthesiology

## 2021-06-25 ENCOUNTER — Encounter (HOSPITAL_BASED_OUTPATIENT_CLINIC_OR_DEPARTMENT_OTHER): Payer: Self-pay | Admitting: General Surgery

## 2021-06-25 ENCOUNTER — Encounter (HOSPITAL_BASED_OUTPATIENT_CLINIC_OR_DEPARTMENT_OTHER)
Admission: RE | Disposition: A | Discharge status: Home / Self Care | Payer: Self-pay | Source: Home / Self Care | Attending: General Surgery

## 2021-06-25 DIAGNOSIS — C50811 Malignant neoplasm of overlapping sites of right female breast: Secondary | ICD-10-CM | POA: Diagnosis present

## 2021-06-25 DIAGNOSIS — E039 Hypothyroidism, unspecified: Secondary | ICD-10-CM | POA: Insufficient documentation

## 2021-06-25 DIAGNOSIS — D0511 Intraductal carcinoma in situ of right breast: Secondary | ICD-10-CM | POA: Insufficient documentation

## 2021-06-25 DIAGNOSIS — Z170 Estrogen receptor positive status [ER+]: Secondary | ICD-10-CM | POA: Insufficient documentation

## 2021-06-25 DIAGNOSIS — M1990 Unspecified osteoarthritis, unspecified site: Secondary | ICD-10-CM | POA: Diagnosis not present

## 2021-06-25 HISTORY — PX: MASTECTOMY W/ SENTINEL NODE BIOPSY: SHX2001

## 2021-06-25 HISTORY — DX: Family history of other specified conditions: Z84.89

## 2021-06-25 SURGERY — MASTECTOMY WITH SENTINEL LYMPH NODE BIOPSY
Anesthesia: General | Site: Breast | Laterality: Right

## 2021-06-25 MED ORDER — MIDAZOLAM HCL 2 MG/2ML IJ SOLN
INTRAMUSCULAR | Status: AC
  Filled 2021-06-25: qty 2

## 2021-06-25 MED ORDER — PROCHLORPERAZINE MALEATE 10 MG PO TABS
10.0000 mg | ORAL_TABLET | Freq: Four times a day (QID) | ORAL | Status: DC | PRN
  Filled 2021-06-25: qty 1

## 2021-06-25 MED ORDER — ACETAMINOPHEN 10 MG/ML IV SOLN
INTRAVENOUS | Status: AC
  Filled 2021-06-25: qty 100

## 2021-06-25 MED ORDER — CEFAZOLIN SODIUM-DEXTROSE 2-4 GM/100ML-% IV SOLN
2.0000 g | INTRAVENOUS | Status: AC
  Administered 2021-06-25: 2 g via INTRAVENOUS

## 2021-06-25 MED ORDER — DEXAMETHASONE SODIUM PHOSPHATE 10 MG/ML IJ SOLN
INTRAMUSCULAR | Status: AC
  Filled 2021-06-25: qty 1

## 2021-06-25 MED ORDER — CHLORHEXIDINE GLUCONATE CLOTH 2 % EX PADS: 6.0000 | MEDICATED_PAD | Freq: Once | CUTANEOUS | Status: DC

## 2021-06-25 MED ORDER — ACETAMINOPHEN 10 MG/ML IV SOLN: 1000.0000 mg | Freq: Once | INTRAVENOUS | Status: DC | PRN

## 2021-06-25 MED ORDER — MELATONIN 3 MG PO TABS
3.0000 mg | ORAL_TABLET | Freq: Every evening | ORAL | Status: DC | PRN
  Filled 2021-06-25: qty 1

## 2021-06-25 MED ORDER — ATROPINE SULFATE 0.4 MG/ML IV SOLN
INTRAVENOUS | Status: AC
  Filled 2021-06-25: qty 1

## 2021-06-25 MED ORDER — ACETAMINOPHEN 500 MG PO TABS
1000.0000 mg | ORAL_TABLET | ORAL | Status: AC
  Administered 2021-06-25: 1000 mg via ORAL

## 2021-06-25 MED ORDER — OXYCODONE HCL 5 MG PO TABS
5.0000 mg | ORAL_TABLET | Freq: Once | ORAL | Status: DC | PRN
  Filled 2021-06-25: qty 1

## 2021-06-25 MED ORDER — DIPHENHYDRAMINE HCL 50 MG/ML IJ SOLN: 12.5000 mg | Freq: Four times a day (QID) | INTRAMUSCULAR | Status: DC | PRN

## 2021-06-25 MED ORDER — ONDANSETRON HCL 4 MG/2ML IJ SOLN
INTRAMUSCULAR | Status: AC
  Filled 2021-06-25: qty 2

## 2021-06-25 MED ORDER — SCOPOLAMINE 1 MG/3DAYS TD PT72
MEDICATED_PATCH | TRANSDERMAL | Status: AC
  Filled 2021-06-25: qty 1

## 2021-06-25 MED ORDER — LIDOCAINE 2% (20 MG/ML) 5 ML SYRINGE
INTRAMUSCULAR | Status: AC
  Filled 2021-06-25: qty 5

## 2021-06-25 MED ORDER — HYDROMORPHONE HCL 1 MG/ML IJ SOLN
INTRAMUSCULAR | Status: AC
  Filled 2021-06-25: qty 0.5

## 2021-06-25 MED ORDER — ONDANSETRON HCL 4 MG/2ML IJ SOLN: 4.0000 mg | Freq: Once | INTRAMUSCULAR | Status: DC | PRN

## 2021-06-25 MED ORDER — EPHEDRINE 5 MG/ML INJ
INTRAVENOUS | Status: AC
  Filled 2021-06-25: qty 5

## 2021-06-25 MED ORDER — FENTANYL CITRATE (PF) 100 MCG/2ML IJ SOLN
INTRAMUSCULAR | Status: DC | PRN
  Administered 2021-06-25: 50 ug via INTRAVENOUS
  Administered 2021-06-25 (×2): 25 ug via INTRAVENOUS

## 2021-06-25 MED ORDER — GABAPENTIN 100 MG PO CAPS
100.0000 mg | ORAL_CAPSULE | Freq: Two times a day (BID) | ORAL | Status: DC
  Administered 2021-06-26: 100 mg via ORAL

## 2021-06-25 MED ORDER — AMISULPRIDE (ANTIEMETIC) 5 MG/2ML IV SOLN: 10.0000 mg | Freq: Once | INTRAVENOUS | Status: DC | PRN

## 2021-06-25 MED ORDER — BUPIVACAINE HCL (PF) 0.5 % IJ SOLN
INTRAMUSCULAR | Status: DC | PRN
  Administered 2021-06-25: 15 mL

## 2021-06-25 MED ORDER — KCL IN DEXTROSE-NACL 20-5-0.45 MEQ/L-%-% IV SOLN
INTRAVENOUS | Status: AC
  Filled 2021-06-25: qty 1000

## 2021-06-25 MED ORDER — LIDOCAINE HCL (CARDIAC) PF 100 MG/5ML IV SOSY
PREFILLED_SYRINGE | INTRAVENOUS | Status: DC | PRN
  Administered 2021-06-25: 40 mg via INTRAVENOUS

## 2021-06-25 MED ORDER — HYDROMORPHONE HCL 1 MG/ML IJ SOLN
INTRAMUSCULAR | Status: AC
Start: 2021-06-25 — End: ?
  Filled 2021-06-25: qty 0.5

## 2021-06-25 MED ORDER — ONDANSETRON 4 MG PO TBDP: 4.0000 mg | ORAL_TABLET | Freq: Four times a day (QID) | ORAL | Status: DC | PRN

## 2021-06-25 MED ORDER — PANTOPRAZOLE SODIUM 40 MG PO TBEC: 40.0000 mg | DELAYED_RELEASE_TABLET | Freq: Every day | ORAL | Status: DC

## 2021-06-25 MED ORDER — LACTATED RINGERS IV SOLN: INTRAVENOUS | Status: DC

## 2021-06-25 MED ORDER — MORPHINE SULFATE (PF) 4 MG/ML IV SOLN: 1.0000 mg | INTRAVENOUS | Status: DC | PRN

## 2021-06-25 MED ORDER — GABAPENTIN 300 MG PO CAPS
ORAL_CAPSULE | ORAL | Status: AC
  Filled 2021-06-25: qty 1

## 2021-06-25 MED ORDER — PROCHLORPERAZINE EDISYLATE 10 MG/2ML IJ SOLN: 5.0000 mg | Freq: Four times a day (QID) | INTRAMUSCULAR | Status: DC | PRN

## 2021-06-25 MED ORDER — BUPIVACAINE LIPOSOME 1.3 % IJ SUSP
INTRAMUSCULAR | Status: DC | PRN
  Administered 2021-06-25: 10 mL

## 2021-06-25 MED ORDER — SUMATRIPTAN SUCCINATE 100 MG PO TABS
100.0000 mg | ORAL_TABLET | ORAL | Status: DC | PRN
Start: 2021-06-25 — End: 2021-06-26
  Filled 2021-06-25: qty 1

## 2021-06-25 MED ORDER — CEFAZOLIN SODIUM-DEXTROSE 2-4 GM/100ML-% IV SOLN
2.0000 g | Freq: Three times a day (TID) | INTRAVENOUS | Status: AC
  Administered 2021-06-25: 2 g via INTRAVENOUS
  Filled 2021-06-25: qty 100

## 2021-06-25 MED ORDER — KCL-LACTATED RINGERS-D5W 20 MEQ/L IV SOLN: INTRAVENOUS | Status: DC

## 2021-06-25 MED ORDER — DEXAMETHASONE SODIUM PHOSPHATE 4 MG/ML IJ SOLN
INTRAMUSCULAR | Status: DC | PRN
  Administered 2021-06-25: 5 mg via INTRAVENOUS

## 2021-06-25 MED ORDER — GABAPENTIN 100 MG PO CAPS
ORAL_CAPSULE | ORAL | Status: AC
  Filled 2021-06-25: qty 1

## 2021-06-25 MED ORDER — METHOCARBAMOL 500 MG PO TABS
500.0000 mg | ORAL_TABLET | Freq: Four times a day (QID) | ORAL | Status: DC | PRN
  Administered 2021-06-25 – 2021-06-26 (×2): 500 mg via ORAL
  Filled 2021-06-25 (×2): qty 1

## 2021-06-25 MED ORDER — HYDROMORPHONE HCL 1 MG/ML IJ SOLN
0.2500 mg | INTRAMUSCULAR | Status: DC | PRN
  Administered 2021-06-25 (×4): 0.5 mg via INTRAVENOUS

## 2021-06-25 MED ORDER — MIDAZOLAM HCL 5 MG/5ML IJ SOLN
INTRAMUSCULAR | Status: DC | PRN
  Administered 2021-06-25: 1 mg via INTRAVENOUS

## 2021-06-25 MED ORDER — 0.9 % SODIUM CHLORIDE (POUR BTL) OPTIME
TOPICAL | Status: DC | PRN
  Administered 2021-06-25: 1000 mL

## 2021-06-25 MED ORDER — BUTALBITAL-APAP-CAFFEINE 50-325-40 MG PO TABS: 1.0000 | ORAL_TABLET | Freq: Four times a day (QID) | ORAL | Status: DC | PRN

## 2021-06-25 MED ORDER — FENTANYL CITRATE (PF) 100 MCG/2ML IJ SOLN
100.0000 ug | Freq: Once | INTRAMUSCULAR | Status: AC
  Administered 2021-06-25: 100 ug via INTRAVENOUS

## 2021-06-25 MED ORDER — LEVOTHYROXINE SODIUM 112 MCG PO TABS
112.0000 ug | ORAL_TABLET | Freq: Every day | ORAL | Status: DC
  Filled 2021-06-25: qty 1

## 2021-06-25 MED ORDER — ACETAMINOPHEN 10 MG/ML IV SOLN
1000.0000 mg | Freq: Once | INTRAVENOUS | Status: DC | PRN
  Administered 2021-06-25: 1000 mg via INTRAVENOUS

## 2021-06-25 MED ORDER — OXYCODONE HCL 5 MG PO TABS
5.0000 mg | ORAL_TABLET | ORAL | Status: DC | PRN
  Administered 2021-06-25: 10 mg via ORAL
  Administered 2021-06-25: 5 mg via ORAL
  Administered 2021-06-26: 10 mg via ORAL
  Administered 2021-06-26: 5 mg via ORAL
  Filled 2021-06-25: qty 1
  Filled 2021-06-25: qty 2

## 2021-06-25 MED ORDER — MIDAZOLAM HCL 2 MG/2ML IJ SOLN
2.0000 mg | Freq: Once | INTRAMUSCULAR | Status: AC
Start: 2021-06-25 — End: 2021-06-25
  Administered 2021-06-25: 2 mg via INTRAVENOUS

## 2021-06-25 MED ORDER — SCOPOLAMINE 1 MG/3DAYS TD PT72
1.0000 | MEDICATED_PATCH | TRANSDERMAL | Status: DC
  Administered 2021-06-25: 1.5 mg via TRANSDERMAL

## 2021-06-25 MED ORDER — GABAPENTIN 300 MG PO CAPS
300.0000 mg | ORAL_CAPSULE | ORAL | Status: AC
  Administered 2021-06-25: 300 mg via ORAL

## 2021-06-25 MED ORDER — PROPOFOL 10 MG/ML IV BOLUS
INTRAVENOUS | Status: DC | PRN
  Administered 2021-06-25: 200 mg via INTRAVENOUS
  Administered 2021-06-25: 50 mg via INTRAVENOUS

## 2021-06-25 MED ORDER — DIPHENHYDRAMINE HCL 12.5 MG/5ML PO ELIX: 12.5000 mg | ORAL_SOLUTION | Freq: Four times a day (QID) | ORAL | Status: DC | PRN

## 2021-06-25 MED ORDER — MAGTRACE LYMPHATIC TRACER
INTRAMUSCULAR | Status: DC | PRN
  Administered 2021-06-25: 2 mL via INTRAMUSCULAR

## 2021-06-25 MED ORDER — IBUPROFEN 600 MG PO TABS: 600.0000 mg | ORAL_TABLET | Freq: Four times a day (QID) | ORAL | Status: DC | PRN

## 2021-06-25 MED ORDER — TRAMADOL HCL 50 MG PO TABS: 50.0000 mg | ORAL_TABLET | Freq: Four times a day (QID) | ORAL | Status: DC | PRN

## 2021-06-25 MED ORDER — PHENYLEPHRINE 80 MCG/ML (10ML) SYRINGE FOR IV PUSH (FOR BLOOD PRESSURE SUPPORT)
PREFILLED_SYRINGE | INTRAVENOUS | Status: AC
  Filled 2021-06-25: qty 10

## 2021-06-25 MED ORDER — CEFAZOLIN SODIUM-DEXTROSE 2-4 GM/100ML-% IV SOLN
INTRAVENOUS | Status: AC
  Filled 2021-06-25: qty 100

## 2021-06-25 MED ORDER — FENTANYL CITRATE (PF) 100 MCG/2ML IJ SOLN
INTRAMUSCULAR | Status: AC
  Filled 2021-06-25: qty 2

## 2021-06-25 MED ORDER — HYDROMORPHONE HCL 1 MG/ML IJ SOLN: 0.2500 mg | INTRAMUSCULAR | Status: DC | PRN

## 2021-06-25 MED ORDER — SENNA 8.6 MG PO TABS
1.0000 | ORAL_TABLET | Freq: Two times a day (BID) | ORAL | Status: DC
  Filled 2021-06-25: qty 1

## 2021-06-25 MED ORDER — ACETAMINOPHEN 500 MG PO TABS
1000.0000 mg | ORAL_TABLET | Freq: Four times a day (QID) | ORAL | Status: DC
  Administered 2021-06-26 (×2): 1000 mg via ORAL
  Filled 2021-06-25 (×2): qty 2

## 2021-06-25 MED ORDER — ACETAMINOPHEN 500 MG PO TABS
ORAL_TABLET | ORAL | Status: AC
  Filled 2021-06-25: qty 2

## 2021-06-25 MED ORDER — ONDANSETRON HCL 4 MG/2ML IJ SOLN: 4.0000 mg | Freq: Four times a day (QID) | INTRAMUSCULAR | Status: DC | PRN

## 2021-06-25 MED ORDER — OXYCODONE HCL 5 MG/5ML PO SOLN: 5.0000 mg | Freq: Once | ORAL | Status: DC | PRN

## 2021-06-25 MED ORDER — FLUTICASONE PROPIONATE 50 MCG/ACT NA SUSP: 1.0000 | Freq: Every day | NASAL | Status: DC

## 2021-06-25 MED ORDER — SUCCINYLCHOLINE CHLORIDE 200 MG/10ML IV SOSY
PREFILLED_SYRINGE | INTRAVENOUS | Status: AC
  Filled 2021-06-25: qty 10

## 2021-06-25 SURGICAL SUPPLY — 72 items
BAG DECANTER FOR FLEXI CONT (MISCELLANEOUS) ×1 IMPLANT
BINDER BREAST LRG (GAUZE/BANDAGES/DRESSINGS) ×1 IMPLANT
BINDER BREAST MEDIUM (GAUZE/BANDAGES/DRESSINGS) IMPLANT
BINDER BREAST XLRG (GAUZE/BANDAGES/DRESSINGS) IMPLANT
BINDER BREAST XXLRG (GAUZE/BANDAGES/DRESSINGS) IMPLANT
BIOPATCH RED 1 DISK 7.0 (GAUZE/BANDAGES/DRESSINGS) IMPLANT
BLADE HEX COATED 2.75 (ELECTRODE) ×2 IMPLANT
BLADE SURG 10 STRL SS (BLADE) ×2 IMPLANT
BLADE SURG 15 STRL LF DISP TIS (BLADE) ×1 IMPLANT
BLADE SURG 15 STRL SS (BLADE) ×2
CANISTER SUCT 1200ML W/VALVE (MISCELLANEOUS) ×2 IMPLANT
CHLORAPREP W/TINT 26 (MISCELLANEOUS) ×1 IMPLANT
CLIP TI LARGE 6 (CLIP) IMPLANT
CLIP TI MEDIUM 6 (CLIP) ×5 IMPLANT
CLIP TI WIDE RED SMALL 6 (CLIP) IMPLANT
COVER MAYO STAND STRL (DRAPES) IMPLANT
COVER PROBE W GEL 5X96 (DRAPES) ×2 IMPLANT
DERMABOND ADVANCED (GAUZE/BANDAGES/DRESSINGS) ×1
DERMABOND ADVANCED .7 DNX12 (GAUZE/BANDAGES/DRESSINGS) ×1 IMPLANT
DRAIN CHANNEL 19F RND (DRAIN) ×2 IMPLANT
DRAPE UTILITY XL STRL (DRAPES) ×2 IMPLANT
DRSG PAD ABDOMINAL 8X10 ST (GAUZE/BANDAGES/DRESSINGS) ×4 IMPLANT
DURAPREP 26ML APPLICATOR (WOUND CARE) ×1 IMPLANT
ELECT BLADE 4.0 EZ CLEAN MEGAD (MISCELLANEOUS) ×2
ELECT REM PT RETURN 9FT ADLT (ELECTROSURGICAL) ×2
ELECTRODE BLDE 4.0 EZ CLN MEGD (MISCELLANEOUS) IMPLANT
ELECTRODE REM PT RTRN 9FT ADLT (ELECTROSURGICAL) ×1 IMPLANT
EVACUATOR SILICONE 100CC (DRAIN) ×2 IMPLANT
GAUZE SPONGE 4X4 12PLY STRL (GAUZE/BANDAGES/DRESSINGS) ×2 IMPLANT
GLOVE BIO SURGEON STRL SZ 6 (GLOVE) ×2 IMPLANT
GLOVE BIO SURGEON STRL SZ7.5 (GLOVE) ×1 IMPLANT
GLOVE BIOGEL PI IND STRL 6.5 (GLOVE) ×1 IMPLANT
GLOVE BIOGEL PI IND STRL 7.5 (GLOVE) IMPLANT
GLOVE BIOGEL PI INDICATOR 6.5 (GLOVE) ×1
GLOVE BIOGEL PI INDICATOR 7.5 (GLOVE) ×1
GOWN STRL REUS W/ TWL LRG LVL3 (GOWN DISPOSABLE) ×1 IMPLANT
GOWN STRL REUS W/TWL 2XL LVL3 (GOWN DISPOSABLE) ×2 IMPLANT
GOWN STRL REUS W/TWL LRG LVL3 (GOWN DISPOSABLE) ×2
LIGHT WAVEGUIDE WIDE FLAT (MISCELLANEOUS) ×1 IMPLANT
NDL HYPO 25X1 1.5 SAFETY (NEEDLE) ×1 IMPLANT
NDL SAFETY ECLIPSE 18X1.5 (NEEDLE) ×1 IMPLANT
NDL SPNL 18GX3.5 QUINCKE PK (NEEDLE) IMPLANT
NDL SPNL 22GX3.5 QUINCKE BK (NEEDLE) IMPLANT
NEEDLE HYPO 18GX1.5 SHARP (NEEDLE)
NEEDLE HYPO 25X1 1.5 SAFETY (NEEDLE) ×2 IMPLANT
NEEDLE SPNL 18GX3.5 QUINCKE PK (NEEDLE) IMPLANT
NEEDLE SPNL 22GX3.5 QUINCKE BK (NEEDLE) IMPLANT
NS IRRIG 1000ML POUR BTL (IV SOLUTION) ×2 IMPLANT
PACK BASIN DAY SURGERY FS (CUSTOM PROCEDURE TRAY) ×2 IMPLANT
PACK UNIVERSAL I (CUSTOM PROCEDURE TRAY) ×2 IMPLANT
PENCIL SMOKE EVACUATOR (MISCELLANEOUS) ×2 IMPLANT
PIN SAFETY STERILE (MISCELLANEOUS) ×2 IMPLANT
SLEEVE SCD COMPRESS KNEE MED (STOCKING) ×2 IMPLANT
SPIKE FLUID TRANSFER (MISCELLANEOUS) IMPLANT
SPONGE T-LAP 18X18 ~~LOC~~+RFID (SPONGE) ×2 IMPLANT
STAPLER VISISTAT 35W (STAPLE) IMPLANT
STOCKINETTE IMPERVIOUS LG (DRAPES) IMPLANT
STRIP CLOSURE SKIN 1/2X4 (GAUZE/BANDAGES/DRESSINGS) ×2 IMPLANT
SUT ETHILON 2 0 FS 18 (SUTURE) ×1 IMPLANT
SUT MNCRL AB 4-0 PS2 18 (SUTURE) ×8 IMPLANT
SUT SILK 0 TIES 10X30 (SUTURE) ×1 IMPLANT
SUT SILK 2 0 SH (SUTURE) IMPLANT
SUT VICRYL 3-0 CR8 SH (SUTURE) ×6 IMPLANT
SUT VICRYL AB 2 0 TIE (SUTURE) ×1 IMPLANT
SUT VICRYL AB 2 0 TIES (SUTURE) ×2
SYR BULB IRRIG 60ML STRL (SYRINGE) ×2 IMPLANT
SYR CONTROL 10ML LL (SYRINGE) ×2 IMPLANT
TOWEL GREEN STERILE FF (TOWEL DISPOSABLE) ×2 IMPLANT
TRACER MAGTRACE VIAL (MISCELLANEOUS) IMPLANT
TUBE CONNECTING 20X1/4 (TUBING) ×2 IMPLANT
UNDERPAD 30X36 HEAVY ABSORB (UNDERPADS AND DIAPERS) ×2 IMPLANT
YANKAUER SUCT BULB TIP NO VENT (SUCTIONS) ×2 IMPLANT

## 2021-06-25 NOTE — Discharge Instructions (Addendum)
Last tylenol and oxycodone was at 0620 ? ? ?[facility] ?[phone] ? ?MASTECTOMY: POST OP INSTRUCTIONS ? ?Always review your discharge instruction sheet given to you by the facility where your surgery was performed. ?IF YOU HAVE DISABILITY OR FAMILY LEAVE FORMS, YOU MUST BRING THEM TO THE OFFICE FOR PROCESSING.   ?DO NOT GIVE THEM TO YOUR DOCTOR. ?A prescription for pain medication may be given to you upon discharge.  Take your pain medication as prescribed, if needed.  If narcotic pain medicine is not needed, then you may take acetaminophen (Tylenol) or ibuprofen (Advil) as needed. ?Take your usually prescribed medications unless otherwise directed. ?If you need a refill on your pain medication, please contact your pharmacy.  They will contact our office to request authorization.  Prescriptions will not be filled after 5pm or on week-ends. ?You should follow a light diet the first few days after arrival home, such as soup and crackers, etc.  Resume your normal diet the day after surgery. ?Most patients will experience some swelling and bruising on the chest and underarm.  Ice packs will help.  Swelling and bruising can take several days to resolve.  ?It is common to experience some constipation if taking pain medication after surgery.  Increasing fluid intake and taking a stool softener (such as Colace) will usually help or prevent this problem from occurring.  A mild laxative (Milk of Magnesia or Miralax) should be taken according to package instructions if there are no bowel movements after 48 hours. ?Unless discharge instructions indicate otherwise, leave your bandage dry and in place until your next appointment in 3-5 days.  You may take a limited sponge bath.  No tube baths or showers until the drains are removed.  You may have steri-strips (small skin tapes) in place directly over the incision.  These strips should be left on the skin for 7-10 days.  If your surgeon used skin glue on  the incision, you may shower in 24 hours.  The glue will flake off over the next 2-3 weeks.  Any sutures or staples will be removed at the office during your follow-up visit. ?DRAINS:  If you have drains in place, it is important to keep a list of the amount of drainage produced each day in your drains.  Before leaving the hospital, you should be instructed on drain care.  Call our office if you have any questions about your drains. ?ACTIVITIES:  You may resume regular (light) daily activities beginning the next day--such as daily self-care, walking, climbing stairs--gradually increasing activities as tolerated.  You may have sexual intercourse when it is comfortable.  Refrain from any heavy lifting or straining until approved by your doctor. ?You may drive when you are no longer taking prescription pain medication, you can comfortably wear a seatbelt, and you can safely maneuver your car and apply brakes. ?RETURN TO WORK:  __________________________________________________________ ?You should see your doctor in the office for a follow-up appointment approximately 3-5 days after your surgery.  Your doctor?s nurse will typically make your follow-up appointment when she calls you with your pathology report.  Expect your pathology report 2-3 business days after your surgery.  You may call to check if you do not hear from [place] after three days.   ?OTHER INSTRUCTIONS: ______________________________________________________________________________________________ ____________________________________________________________________________________________ ?WHEN TO CALL YOUR DOCTOR: ?Fever over 101.0 ?Nausea and/or vomiting ?Extreme swelling or bruising ?Continued bleeding from incision. ?Increased pain, redness, or drainage from the incision. ?The clinic staff is available to answer your  questions during regular business hours.  Please don?t hesitate to call and ask to speak to one of the nurses for clinical concerns.  If you  have a medical emergency, go to the nearest emergency room or call 911.  A surgeon from [facility] is always on call at the hospital. ?[address] [place], [place], [place]  [phone] ? [address], [place], [place]   [address] ?[phone] ? [phone] ? FAX [phone] ?Web site: www.cent ? ?Post Anesthesia Home Care Instructions ? ?Activity: ?Get plenty of rest for the remainder of the day. A responsible individual must stay with you for 24 hours following the procedure.  ?For the next 24 hours, DO NOT: ?-Drive a car ?-Paediatric nurse ?-Drink alcoholic beverages ?-Take any medication unless instructed by your physician ?-Make any legal decisions or sign important papers. ? ?Meals: ?Start with liquid foods such as gelatin or soup. Progress to regular foods as tolerated. Avoid greasy, spicy, heavy foods. If nausea and/or vomiting occur, drink only clear liquids until the nausea and/or vomiting subsides. Call your physician if vomiting continues. ? ?Special Instructions/Symptoms: ?Your throat may feel dry or sore from the anesthesia or the breathing tube placed in your throat during surgery. If this causes discomfort, gargle with warm salt water. The discomfort should disappear within 24 hours. ? ?If you had a scopolamine patch placed behind your ear for the management of post- operative nausea and/or vomiting: ? ?1. The medication in the patch is effective for 72 hours, after which it should be removed.  Wrap patch in a tissue and discard in the trash. Wash hands thoroughly with soap and water. ?2. You may remove the patch earlier than 72 hours if you experience unpleasant side effects which may include dry mouth, dizziness or visual disturbances. ?3. Avoid touching the patch. Wash your hands with soap and water after contact with the patch. ? ? ?    ?

## 2021-06-25 NOTE — Progress Notes (Signed)
Patient had R Mastectomy with SLNB. Will follow for pathology.  ? ?Oncology Nurse Navigator Documentation ? ? ?  06/25/2021  ? 11:30 AM  ?Oncology Nurse Navigator Flowsheets  ?Surgery Actual Start Date: 06/25/2021  ?Navigator Follow Up Date: 06/28/2021  ?Navigator Follow Up Reason: Pathology  ?Navigator Location [facility]  ?Navigator Encounter Type Appt/Treatment Plan Review  ?Patient Visit Type MedOnc  ?Treatment Phase Pre-Tx/Tx Discussion  ?Barriers/Navigation Needs Coordination of Care;Education  ?Interventions None Required  ?Acuity Level 2-Minimal Needs (1-2 Barriers Identified)  ?Support Groups/Services Friends and Family  ?Time Spent with Patient 15  ?  ?

## 2021-06-25 NOTE — Op Note (Addendum)
Right Mastectomy with Sentinel Node Biopsy ? ?Indications: This patient presents with history of recurrent right breast cancer  ? ?Pre-operative Diagnosis: Right breast cancer, [id number], multiple quadrants, receptors ER/PR+ / HER2 - ? ?Post-operative Diagnosis: same ? ?Surgeon: Stark Klein  ? ?Assistant: Nicanor Alcon, MD, [place] ? ?Anesthesia: General endotracheal anesthesia and pectoral block ? ?ASA Class: 2 ? ?Procedure Details  ?The patient was seen in the Holding Room. The risks, benefits, complications, treatment options, and expected outcomes were discussed with the patient. The possibilities of reaction to medication, pulmonary aspiration, bleeding, infection, the need for additional procedures, failure to diagnose a condition, and creating a complication requiring transfusion or operation were discussed with the patient. The patient concurred with the proposed plan, giving informed consent.  The site of surgery properly noted/marked. The patient was taken to Operating Room # 1, identified as Rachel [name]  and the procedure verified as Right Mastectomy and Sentinel Node Biopsy. After induction of anesthesia, the right arm, breast, and chest were prepped and draped in standard fashion.  A Time Out was held and the above information confirmed.  The MagTrace was injected in the subareolar location.   ? ?The borders of the breast were identified and marked.  The incisions of the breast were drawn out to make sure incision lines were equidistant in length.    The previous lumpectomy incision was incorporated into the incision. The superior incision was made with the #10 blade.  Mastectomy hooks were used to provide elevation of the skin edges, and the cautery was used to create the mastectomy flaps.  The dissection was taken to the fascia of the pectoralis major.  The penetrating vessels were clipped as needed.  The superior flap was taken medially to the lateral sternal border, superiorly to the inferior  border of the clavicle.  The inferior flap was similarly created, inferiorly to the inframammary fold and laterally to the border of the latissimus.  The breast was taken off including the pectoralis fascia and the axillary tail marked.   ? ?Using a Sentimag probe, axillary sentinel nodes were identified.  Three deep level 2 axillary sentinel nodes were removed and submitted to pathology.  The findings are below.  The lymphovascular channels were clipped with metal clips.     ?   ?The wound was irrigated. One 19 Blake drain was placed laterally and secured with a 2-0 nylon.   Hemostasis was achieved with cautery.  The wound was irrigated and closed with a 3-0 Vicryl deep dermal interrupted sutures and 4-0 Vicryl subcuticular closure in layers. ?   ?Sterile dressings were applied. At the end of the operation, all sponge, instrument, and needle counts were correct. ? ?Findings: ?grossly clear surgical margins, SLN #1 cps 50, SLN #2 cps 20, SLN #3 cps 45 ? ?Estimated Blood Loss: 27m  ?        ?Drains: 19 Fr blake drain in right chest wall ?               ?Specimens: right breast and three right axillary sentinel nodes ?        ?Complications:  None; patient tolerated the procedure well. ?        ?Disposition: PACU - hemodynamically stable. ?        ?Condition: stable ? ?

## 2021-06-25 NOTE — Transfer of Care (Signed)
Immediate Anesthesia Transfer of Care Note ? ?Patient: [facility] ? ?Procedure(s) Performed: RIGHT MASTECTOMY WITH SENTINEL LYMPH NODE BIOPSY (Right: Breast) ? ?Patient Location: PACU ? ?Anesthesia Type:GA combined with regional for post-op pain ? ?Level of Consciousness: awake, alert , oriented, drowsy and patient cooperative ? ?Airway & Oxygen Therapy: Patient Spontanous Breathing and Patient connected to face mask oxygen ? ?Post-op Assessment: Report given to RN and Post -op Vital signs reviewed and stable ? ?Post vital signs: Reviewed and stable ? ?Last Vitals:  ?Vitals Value Taken Time  ?BP    ?Temp    ?Pulse    ?Resp    ?SpO2    ? ? ?Last Pain:  ?Vitals:  ? 06/25/21 0638  ?TempSrc: Oral  ?PainSc: 0-No pain  ?   ? ?  ? ?Complications: No notable events documented. ?

## 2021-06-25 NOTE — Interval H&P Note (Signed)
History and Physical Interval Note: ? ?06/25/2021 ?7:31 AM ? ?[facility]  has presented today for surgery, with the diagnosis of RIGHT BREAST CANCER.  The various methods of treatment have been discussed with the patient and family. After consideration of risks, benefits and other options for treatment, the patient has consented to  Procedure(s): ?RIGHT MASTECTOMY WITH SENTINEL LYMPH NODE BIOPSY (Right) as a surgical intervention.  The patient's history has been reviewed, patient examined, no change in status, stable for surgery.  I have reviewed the patient's chart and labs.  Questions were answered to the patient's satisfaction.   ? ? ?Stark Klein ? ? ?

## 2021-06-25 NOTE — Anesthesia Procedure Notes (Signed)
Procedure Name: LMA Insertion ?Date/Time: 06/25/2021 8:00 AM ?Performed by: Willa Frater, CRNA ?Pre-anesthesia Checklist: Patient identified, Emergency Drugs available, Suction available and Patient being monitored ?Patient Re-evaluated:Patient Re-evaluated prior to induction ?Oxygen Delivery Method: Circle system utilized ?Preoxygenation: Pre-oxygenation with 100% oxygen ?Induction Type: IV induction ?Ventilation: Mask ventilation without difficulty ?LMA: LMA inserted ?LMA Size: 4.0 ?Number of attempts: 1 ?Airway Equipment and Method: Bite block ?Placement Confirmation: positive ETCO2 ?Tube secured with: Tape ?Dental Injury: Teeth and Oropharynx as per pre-operative assessment  ? ? ? ? ?

## 2021-06-25 NOTE — Anesthesia Procedure Notes (Signed)
Anesthesia Regional Block: Pectoralis block  ? ?Pre-Anesthetic Checklist: , timeout performed,  Correct Patient, Correct Site, Correct Laterality,  Correct Procedure, Correct Position, site marked,  Risks and benefits discussed,  Surgical consent,  Pre-op evaluation,  At surgeon's request and post-op pain management ? ?Laterality: Right and Upper ? ?Prep: chloraprep     ?  ?Needles:  ?Injection technique: Single-shot ? ?Needle Type: Echogenic Needle   ? ? ?Needle Length: 9cm  ?Needle Gauge: 21  ? ? ? ?Additional Needles: ? ? ?Procedures:,,,, ultrasound used (permanent image in chart),,    ?Narrative:  ?Start time: 06/25/2021 7:04 AM ?End time: 06/25/2021 7:10 AM ?Injection made incrementally with aspirations every 5 mL. ? ?Performed by: Personally  ?Anesthesiologist: Barnet Glasgow, MD ? ?Additional Notes: ?Block assessed. Patient tolerated procedure well. ? ? ? ? ?

## 2021-06-25 NOTE — Progress Notes (Signed)
Assisted Dr. Houser with right, pectoralis, ultrasound guided block. Side rails up, monitors on throughout procedure. See vital signs in flow sheet. Tolerated Procedure well. 

## 2021-06-25 NOTE — Anesthesia Postprocedure Evaluation (Signed)
Anesthesia Post Note ? ?Patient: Rachel [name] [facility] ? ?Procedure(s) Performed: RIGHT MASTECTOMY WITH SENTINEL LYMPH NODE BIOPSY (Right: Breast) ? ?  ? ?Patient location during evaluation: PACU ?Anesthesia Type: General ?Level of consciousness: awake and alert ?Pain management: pain level controlled ?Vital Signs Assessment: post-procedure vital signs reviewed and stable ?Respiratory status: spontaneous breathing, nonlabored ventilation, respiratory function stable and patient connected to nasal cannula oxygen ?Cardiovascular status: blood pressure returned to baseline and stable ?Postop Assessment: no apparent nausea or vomiting ?Anesthetic complications: no ? ? ?No notable events documented. ? ?Last Vitals:  ?Vitals:  ? 06/25/21 1225 06/25/21 1315  ?BP: 121/74   ?Pulse: 67 68  ?Resp: 18   ?Temp: (!) 36.3 ?C   ?SpO2: 98% 100%  ?  ?Last Pain:  ?Vitals:  ? 06/25/21 1245  ?TempSrc:   ?PainSc: 6   ? ? ?  ?  ?  ?  ?  ?  ? ?Barnet Glasgow ? ? ? ? ?

## 2021-06-26 ENCOUNTER — Encounter (HOSPITAL_BASED_OUTPATIENT_CLINIC_OR_DEPARTMENT_OTHER): Payer: Self-pay | Admitting: General Surgery

## 2021-06-26 DIAGNOSIS — D0511 Intraductal carcinoma in situ of right breast: Secondary | ICD-10-CM | POA: Diagnosis not present

## 2021-06-26 MED ORDER — METHOCARBAMOL 500 MG PO TABS: 500.0000 mg | ORAL_TABLET | Freq: Four times a day (QID) | ORAL | 1 refills | Status: DC | PRN

## 2021-06-26 MED ORDER — OXYCODONE HCL 5 MG PO TABS: 5.0000 mg | ORAL_TABLET | Freq: Once | ORAL | 0 refills | Status: DC | PRN

## 2021-06-26 MED ORDER — GABAPENTIN 100 MG PO CAPS: 100.0000 mg | ORAL_CAPSULE | Freq: Two times a day (BID) | ORAL | 1 refills | Status: DC

## 2021-06-27 LAB — SURGICAL PATHOLOGY

## 2021-06-28 ENCOUNTER — Encounter: Payer: Self-pay | Admitting: *Deleted

## 2021-06-28 NOTE — Progress Notes (Signed)
Per Dr Antonieta Pert Oncotype Recurrence Score request sent on specimen [id number] DOS 06/25/2021.   Patient will need to be seen in approx 4 weeks for follow up.  Called patient and scheduled follow up appointment. Patient is aware of appointment date, time and location.   Oncology Nurse Navigator Documentation     06/28/2021    9:30 AM  Oncology Nurse Navigator Flowsheets  Navigator Follow Up Date: 07/29/2021  Navigator Follow Up Reason: Follow-up Appointment  Navigator Location [facility]  Navigator Encounter Type Pathology Review;Molecular Studies;Telephone  Telephone Asess Navigation Needs;Outgoing Call  Patient Visit Type MedOnc  Treatment Phase Active Tx  Barriers/Navigation Needs Coordination of Care;Education  Education Other  Interventions Coordination of Care;Education;Psycho-Social Support  Acuity Level 2-Minimal Needs (1-2 Barriers Identified)  Coordination of Care Appts;Pathology  Education Method Verbal  Support Groups/Services Friends and Family  Time Spent with Patient 15

## 2021-07-01 ENCOUNTER — Encounter: Payer: Self-pay | Admitting: *Deleted

## 2021-07-01 NOTE — Progress Notes (Signed)
Received inquiry from [employer] as to whether Dr Marin Olp wanted both tumors tested for oncotype score.  Spoke to Dr Marin Olp and he would like both tumors tested. Notified Merchand[employer], retail.   Oncology Nurse Navigator Documentation     07/01/2021    3:00 PM  Oncology Nurse Navigator Flowsheets  Navigator Follow Up Date: 07/29/2021  Navigator Follow Up Reason: Follow-up Appointment  Navigator Location [facility]  Navigator Encounter Type Pathology Review  Patient Visit Type MedOnc  Treatment Phase Active Tx  Barriers/Navigation Needs Coordination of Care;Education  Interventions Coordination of Care;Education;Psycho-Social Support  Acuity Level 2-Minimal Needs (1-2 Barriers Identified)  Coordination of Care Pathology  Support Groups/Services Friends and Family  Time Spent with Patient 15

## 2021-07-11 ENCOUNTER — Encounter: Payer: Self-pay | Admitting: *Deleted

## 2021-07-11 NOTE — Progress Notes (Signed)
Oncotype Recurrence Score 25. Dr Marin Olp is aware.   Oncology Nurse Navigator Documentation     07/11/2021    8:00 AM  Oncology Nurse Navigator Flowsheets  Navigator Follow Up Date: 07/29/2021  Navigator Follow Up Reason: Follow-up Appointment  Navigator Location [facility]  Navigator Encounter Type Pathology Review  Patient Visit Type MedOnc  Treatment Phase Active Tx  Barriers/Navigation Needs Coordination of Care;Education  Interventions None Required  Acuity Level 2-Minimal Needs (1-2 Barriers Identified)  Support Groups/Services Friends and Family  Time Spent with Patient 15

## 2021-07-12 ENCOUNTER — Encounter (HOSPITAL_COMMUNITY): Payer: Self-pay

## 2021-07-15 ENCOUNTER — Other Ambulatory Visit: Payer: Self-pay | Admitting: General Surgery

## 2021-07-16 ENCOUNTER — Encounter (HOSPITAL_COMMUNITY): Payer: Self-pay

## 2021-07-18 ENCOUNTER — Encounter: Payer: Self-pay | Admitting: Rehabilitation

## 2021-07-18 ENCOUNTER — Ambulatory Visit: Payer: BC Managed Care – PPO | Attending: General Surgery | Admitting: Rehabilitation

## 2021-07-18 DIAGNOSIS — R293 Abnormal posture: Secondary | ICD-10-CM | POA: Diagnosis present

## 2021-07-18 DIAGNOSIS — Z483 Aftercare following surgery for neoplasm: Secondary | ICD-10-CM | POA: Diagnosis present

## 2021-07-18 DIAGNOSIS — Z171 Estrogen receptor negative status [ER-]: Secondary | ICD-10-CM | POA: Insufficient documentation

## 2021-07-18 DIAGNOSIS — C50611 Malignant neoplasm of axillary tail of right female breast: Secondary | ICD-10-CM | POA: Insufficient documentation

## 2021-07-18 DIAGNOSIS — M25611 Stiffness of right shoulder, not elsewhere classified: Secondary | ICD-10-CM | POA: Diagnosis present

## 2021-07-18 NOTE — Therapy (Signed)
OUTPATIENT PHYSICAL THERAPY BREAST CANCER POST OP FOLLOW UP   Patient Name: Rachel [name] MRN: [id number] DOB:1965-03-25, 56 y.o., female Today's Date: 07/18/2021   PT End of Session - 07/18/21 1205     Visit Number 2    Number of Visits 10    Date for PT Re-Evaluation 09/09/21    PT Start Time 1004    PT Stop Time 1054    PT Time Calculation (min) 50 min    Activity Tolerance Patient tolerated treatment well    Behavior During Therapy [facility] for tasks assessed/performed             Past Medical History:  Diagnosis Date   ASCUS of cervix with negative high risk HPV 03/2018   Cancer ([place]) 2001   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  Prior T score -2.5.  DEXA stable from prior study.   Personal history of chemotherapy 2001   Personal history of radiation therapy 2001   PONV (postoperative nausea and vomiting)    Past Surgical History:  Procedure Laterality Date   BREAST EXCISIONAL BIOPSY Left 2021   BREAST LUMPECTOMY Right 2001   CESAREAN SECTION  1991   MASTECTOMY W/ SENTINEL NODE BIOPSY Right 06/25/2021   Procedure: RIGHT MASTECTOMY WITH SENTINEL LYMPH NODE BIOPSY;  Surgeon: Stark Klein, MD;  Location: [place];  Service: General;  Laterality: Right;   NASAL SINUS SURGERY Right 02/20/2020   Procedure: ENDOSCOPIC SINUS SURGERY WITH RIGHT ETHMOIDECTOMY AND RIGHT MAXILLARY OSTIA ENLARGEMENT WITH FUSION;  Surgeon: Rozetta Nunnery, MD;  Location: [place];  Service: ENT;  Laterality: Right;   PELVIC LAPAROSCOPY     endometriosis   SINUS ENDO WITH FUSION Right 02/20/2020   Procedure: SINUS ENDO WITH FUSION;  Surgeon: Rozetta Nunnery, MD;  Location: [place];  Service: ENT;  Laterality: Right;   [place]   Patient Active Problem List   Diagnosis Date Noted   Cancer of overlapping sites of right female breast ([place]) 06/25/2021   Lateral epicondylitis, left elbow 03/04/2021   Left wrist pain 06/24/2019   Pain in joint of left shoulder 06/24/2019   Pain in joint of left elbow 06/24/2019   Family history of uterine cancer    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 There is no preparing for how much it hurts.  Having some muscle spasm.  I haven't been using my medications.    PERTINENT HISTORY:  Patient was diagnosed on 06/03/21 with right breast cancer recurrence. Initial lumpectomy, SLNB x 3, radiation, chemotherapy, and antiestrogens. Rt mastectomy on 06/25/21 with Dr. Barry Dienes with another SLNB 0/3 positive lymph nodes.  Other hx: chiari malformation   PATIENT GOALS:  Reassess how my recovery is going related to arm function, pain, and swelling.  PAIN:  Are you having pain? Yes: NPRS scale: 4/10 Pain location: pectoralis and axilla.   Pain  description: burning, aching, sharp Aggravating factors: sleeping, reaching overhead Relieving factors: medication, shower, rubbing the area.   PRECAUTIONS: Recent Surgery, right UE Lymphedema risk, 6 total lymph nodes removed x 2 breast cancers  ACTIVITY LEVEL / LEISURE: limited now    OBJECTIVE:   PATIENT SURVEYS:  QUICK DASH: 80%  OBSERVATIONS:  Very well healed very flat mastectomy incision. No edema or redness  Pectoralis axillary border tight with spasm during Abduction  POSTURE:  Slightly guarded  LYMPHEDEMA ASSESSMENT:   UPPER EXTREMITY AROM/PROM:   A/PROM RIGHT  06/17/2021   07/18/21  Shoulder extension 53   Shoulder flexion 170 145 - feeling tight  Shoulder abduction 170 114  Shoulder internal rotation     Shoulder external rotation 90                           (Blank rows = not tested)   CERVICAL AROM: All within normal limits:    LYMPHEDEMA ASSESSMENTS:    [facility] RIGHT  06/17/2021 6/8/23Rt 6/8/23Lt  10 cm proximal to olecranon process 36.5 36.5   Olecranon process 23.5 23.5   10 cm proximal to ulnar styloid process 20.6 20.6   Just proximal to ulnar styloid process 15.3 15.3   Across hand at thumb web space 18 18   At base of 2nd digit 5.8 5.8   (Blank rows = not tested)  TODAY'S TREATMENT:  Standing dowel abduction/scaption x 6 each with instruction and cueing to keep arm relaxed  Supine STM to pectoralis  with gentle pressure and PROM into flexion and abduction stopping with any pectoralis spasm  PATIENT EDUCATION:  Education details: updated HEP, POC Person educated: Patient Education method: Explanation, Demonstration, Tactile cues, and Handouts Education comprehension: verbalized understanding, returned demonstration, verbal cues required, tactile cues required, and needs further education  HOME EXERCISE PROGRAM:  Reviewed previously given post op HEP with changes as needed:  Access Code: [email or web address] URL:  [email or web address] Date: 07/18/2021 Prepared by: Shan Levans  Exercises - Supine Shoulder Flexion AAROM with Hands Clasped - 1-2 x daily - 7 x weekly - 1 sets - 10 reps - 5 second hold - Supine Chest Stretch with Elbows Bent - 1 x daily - 7 x weekly - 1 sets - 3 reps - 30-60seconds hold - Standing Shoulder Abduction AAROM with Dowel - 1-2 x daily - 7 x weekly - 1 sets - 10 reps - 5 second hold - Supine Thoracic Mobilization Towel Roll Vertical with Arm Stretch - 2 x daily - 7 x weekly - 1 sets - 1 reps - 20-30 seconds hold  ASSESSMENT:  CLINICAL IMPRESSION: Pt presents weeks post Rt mastectomy with excellent healing status, improving AROM, with main limitation being Active abduction and trouble with pectoralis tightness and spasm.  Will start PT sessions today so improve AROM and  prepare for chemotherapy which starts soon.  Also reassured pt that she is doing very well 3 weeks post op and to not get frustrated with status at this point.    Pt will benefit from skilled therapeutic intervention to improve on the following deficits: Decreased knowledge of precautions, impaired UE functional use, pain, decreased ROM, postural dysfunction.   PT treatment/interventions: ADL/Self care home management, Therapeutic exercises, Patient/Family education, and Manual therapy   GOALS: Goals reviewed with patient? Yes  LONG TERM GOALS:  (STG=LTG)  GOALS Name Target Date  Goal status  1 Pt will demonstrate she has regained full shoulder ROM and function post operatively compared to baselines.  Baseline: 08/15/2021 IN PROGRESS  2 Pt will be educated on lymphedema risk reduction- ABC class - and SOZO surveillance 08/15/21 INITIAL  3 Pt will feel ind with continued HEP 08/15/21 INITIAL          PLAN: PT FREQUENCY/DURATION: 1-2x per week for 4 weeks  PLAN FOR NEXT SESSION: give info from post op session below, remind pt of ABC class vs inperson education, and make sure SOZO in scheduled.        PROM and STM Rt UE, AAROM activities   [facility]  [address] [place] [address]  [phone]  After Breast Cancer Class It is recommended you attend the ABC class to be educated on lymphedema risk reduction. This class is free of charge and lasts for 1 hour. It is a 1-time class. You will need to download the Webex app either on your phone or computer. We will send you a link the night before or the morning of the class. You should be able to click on that link to join the class. This is not a confidential class. You don't have to turn your camera on, but other participants may be able to see your email address.  Scar massage You can begin gentle scar massage to you incision sites. Gently place one hand on the incision and move the skin (without sliding on the skin) in various directions. Do this for a few minutes and then you can gently massage either coconut oil or vitamin E cream into the scars.  Compression garment You should continue wearing your compression bra until you feel like you no longer have swelling.  Home exercise Program Continue doing the exercises you were given until you feel like you can do them without feeling any tightness at the end.   Walking Program Studies show that 30 minutes of walking per day (fast enough to elevate your heart rate) can significantly reduce the risk of a cancer recurrence. If you can't walk due to other medical reasons, we encourage you to find another activity you could do (like a stationary bike or water exercise).  Posture After breast cancer surgery, people frequently sit with rounded shoulders posture because it puts their incisions on slack and feels better. If you sit like this and scar tissue forms in that position, you can become very tight and have pain sitting or standing with good posture. Try to be aware of your posture and sit and stand up tall to heal properly.  Follow up PT: It is  recommended you return every 3 months for the first 3 years following surgery to be assessed on the SOZO machine for an L-Dex score. This helps prevent clinically significant lymphedema in 95% of patients. These follow up screens are 10 minute appointments that you are not billed  for.  Stark Bray, PT 07/18/2021, 12:07 PM

## 2021-07-22 ENCOUNTER — Other Ambulatory Visit: Payer: Self-pay | Admitting: Hematology & Oncology

## 2021-07-25 ENCOUNTER — Ambulatory Visit: Payer: BC Managed Care – PPO

## 2021-07-25 DIAGNOSIS — R293 Abnormal posture: Secondary | ICD-10-CM

## 2021-07-25 DIAGNOSIS — Z483 Aftercare following surgery for neoplasm: Secondary | ICD-10-CM

## 2021-07-25 DIAGNOSIS — Z171 Estrogen receptor negative status [ER-]: Secondary | ICD-10-CM

## 2021-07-25 DIAGNOSIS — C50611 Malignant neoplasm of axillary tail of right female breast: Secondary | ICD-10-CM | POA: Diagnosis not present

## 2021-07-25 NOTE — Therapy (Signed)
OUTPATIENT PHYSICAL THERAPY BREAST CANCER TREATMENT NOTE   Patient Name: Rachel [name] MRN: [id number] DOB:May 19, 1965, 56 y.o., female Today's Date: 07/25/2021   PT End of Session - 07/25/21 1045     Visit Number 3    Number of Visits 10    Date for PT Re-Evaluation 09/09/21    PT Start Time 1036    PT Stop Time 1135    PT Time Calculation (min) 59 min    Activity Tolerance Patient tolerated treatment well    Behavior During Therapy [facility] for tasks assessed/performed             Past Medical History:  Diagnosis Date   ASCUS of cervix with negative high risk HPV 03/2018   Cancer ([place]) 2001   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  Prior T score -2.5.  DEXA stable from prior study.   Personal history of chemotherapy 2001   Personal history of radiation therapy 2001   PONV (postoperative nausea and vomiting)    Past Surgical History:  Procedure Laterality Date   BREAST EXCISIONAL BIOPSY Left 2021   BREAST LUMPECTOMY Right 2001   CESAREAN SECTION  1991   MASTECTOMY W/ SENTINEL NODE BIOPSY Right 06/25/2021   Procedure: RIGHT MASTECTOMY WITH SENTINEL LYMPH NODE BIOPSY;  Surgeon: Stark Klein, MD;  Location: [place];  Service: General;  Laterality: Right;   NASAL SINUS SURGERY Right 02/20/2020   Procedure: ENDOSCOPIC SINUS SURGERY WITH RIGHT ETHMOIDECTOMY AND RIGHT MAXILLARY OSTIA ENLARGEMENT WITH FUSION;  Surgeon: Rozetta Nunnery, MD;  Location: [place];  Service: ENT;  Laterality: Right;   PELVIC LAPAROSCOPY     endometriosis   SINUS ENDO WITH FUSION Right 02/20/2020   Procedure: SINUS ENDO WITH FUSION;  Surgeon: Rozetta Nunnery, MD;  Location: [place];  Service: ENT;  Laterality: Right;   [place]   Patient Active Problem List   Diagnosis Date Noted   Cancer of overlapping sites of right female breast ([place]) 06/25/2021   Lateral epicondylitis, left elbow 03/04/2021   Left wrist pain 06/24/2019   Pain in joint of left shoulder 06/24/2019   Pain in joint of left elbow 06/24/2019   Family history of uterine cancer    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 I get my port in 08/07/21 and will start chemo sometime after that.   PERTINENT HISTORY:  Patient was diagnosed on 06/03/21 with right breast cancer recurrence. Initial lumpectomy, SLNB x 3, radiation, chemotherapy, and antiestrogens. Rt mastectomy on 06/25/21 with Dr. Barry Dienes with another SLNB 0/3 positive lymph nodes.  Other hx: chiari malformation   PATIENT GOALS:  Reassess how my recovery is going related to arm function, pain, and swelling.  PAIN:  Are you having pain? Yes: NPRS scale: 3/10 Pain location:  pectoralis and axilla (mostly chest wall)   Pain description: tight, uncomfortable Aggravating factors: er, reaching away from my body Relieving factors: medication, shower, rubbing the area, sleeping is getting better   PRECAUTIONS: Recent Surgery, right UE Lymphedema risk, 6 total lymph nodes removed x 2 breast cancers  ACTIVITY LEVEL / LEISURE: limited now    OBJECTIVE:   PATIENT SURVEYS:  QUICK DASH: 80%  OBSERVATIONS:  Very well healed very flat mastectomy incision. No edema or redness  Pectoralis axillary border tight with spasm during Abduction  POSTURE:  Slightly guarded  LYMPHEDEMA ASSESSMENT:   UPPER EXTREMITY AROM/PROM:   A/PROM RIGHT  06/17/2021   07/18/21  Shoulder extension 53   Shoulder flexion 170 145 - feeling tight  Shoulder abduction 170 114  Shoulder internal rotation     Shoulder external rotation 90                           (Blank rows = not tested)   CERVICAL AROM: All within normal limits:    LYMPHEDEMA ASSESSMENTS:    [facility] RIGHT  06/17/2021 6/8/23Rt 6/8/23Lt  10 cm proximal to olecranon process 36.5 36.5   Olecranon process 23.5 23.5   10 cm proximal to ulnar styloid process 20.6 20.6   Just proximal to ulnar styloid process 15.3 15.3   Across hand at thumb web space 18 18   At base of 2nd digit 5.8 5.8   (Blank rows = not tested)  TODAY'S TREATMENT:   07/25/21:   Therapeutic Exs:   Pulleys: x2 mins into flex and scaption with pt returning therapist demo and VCs during to relax   Ball roll up wall into flexion x10 with VCs to relax shoulders and only push into tolerate stretch   Manual Therapy:   STM in supine to Rt chest wall and pectoralis insertion  MFR to Rt axilla during P/ROM  P/ROM in supine to Rt shoulder to per tolerance into flexion, abd and D2 along with er   07/18/21: Standing dowel abduction/scaption x 6 each with instruction and cueing to keep arm relaxed  Supine STM to pectoralis  with gentle pressure and PROM into  flexion and abduction stopping with any pectoralis spasm  PATIENT EDUCATION:  Education details: updated HEP, POC Person educated: Patient Education method: Explanation, Demonstration, Tactile cues, and Handouts Education comprehension: verbalized understanding, returned demonstration, verbal cues required, tactile cues required, and needs further education  HOME EXERCISE PROGRAM:  Reviewed previously given post op HEP with changes as needed:  Access Code: [email or web address] URL: [email or web address] Date: 07/18/2021 Prepared by: Shan Levans  Exercises - Supine Shoulder Flexion AAROM with Hands Clasped - 1-2 x daily - 7 x weekly - 1 sets - 10 reps - 5 second hold - Supine Chest Stretch with Elbows Bent - 1 x daily -  7 x weekly - 1 sets - 3 reps - 30-60seconds hold - Standing Shoulder Abduction AAROM with Dowel - 1-2 x daily - 7 x weekly - 1 sets - 10 reps - 5 second hold - Supine Thoracic Mobilization Towel Roll Vertical with Arm Stretch - 2 x daily - 7 x weekly - 1 sets - 1 reps - 20-30 seconds hold  ASSESSMENT:  CLINICAL IMPRESSION: Pt reports she is sleeping better at night and able to reach higher into flexion. She does feel like she still gets tight after HEP stretching but reports she felt good after last physical therapy session. Progressed with adding AA/ROM exercises today with pulleys and ball roll up wall. Pt able to return correct demo just required VCs to relax shoulder and not push into pain at end motion. Then continued with manual therapy working to decrease Rt upper quadrant tightness facilitating improved end Rt shoulder P/ROM.   Pt will benefit from skilled therapeutic intervention to improve on the following deficits: Decreased knowledge of precautions, impaired UE functional use, pain, decreased ROM, postural dysfunction.   PT treatment/interventions: ADL/Self care home management, Therapeutic exercises, Patient/Family education, and Manual  therapy   GOALS: Goals reviewed with patient? Yes  LONG TERM GOALS:  (STG=LTG)  GOALS Name Target Date  Goal status  1 Pt will demonstrate she has regained full shoulder ROM and function post operatively compared to baselines.  Baseline: 08/15/2021 IN PROGRESS  2 Pt will be educated on lymphedema risk reduction- ABC class - and SOZO surveillance 08/15/21 INITIAL  3 Pt will feel ind with continued HEP 08/15/21 INITIAL          PLAN: PT FREQUENCY/DURATION: 1-2x per week for 4 weeks  PLAN FOR NEXT SESSION: Begin inperson education for ABC class as pt will not be able to make the next one, and make sure SOZO is scheduled; cont   PROM and STM Rt UE, AAROM activities   [facility]  [address], [address]  [place] [address]  [phone]  After Breast Cancer Class It is recommended you attend the ABC class to be educated on lymphedema risk reduction. This class is free of charge and lasts for 1 hour. It is a 1-time class. You will need to download the Webex app either on your phone or computer. We will send you a link the night before or the morning of the class. You should be able to click on that link to join the class. This is not a confidential class. You don't have to turn your camera on, but other participants may be able to see your email address.  Scar massage You can begin gentle scar massage to you incision sites. Gently place one hand on the incision and move the skin (without sliding on the skin) in various directions. Do this for a few minutes and then you can gently massage either coconut oil or vitamin E cream into the scars.  Compression garment You should continue wearing your compression bra until you feel like you no longer have swelling.  Home exercise Program Continue doing the exercises you were given until you feel like you can do them without feeling any tightness at the end.   Walking Program Studies show that 30 minutes of walking per  day (fast enough to elevate your heart rate) can significantly reduce the risk of a cancer recurrence. If you can't walk due to other medical reasons, we encourage you to find another activity you could do (  like a stationary bike or water exercise).  Posture After breast cancer surgery, people frequently sit with rounded shoulders posture because it puts their incisions on slack and feels better. If you sit like this and scar tissue forms in that position, you can become very tight and have pain sitting or standing with good posture. Try to be aware of your posture and sit and stand up tall to heal properly.  Follow up PT: It is recommended you return every 3 months for the first 3 years following surgery to be assessed on the SOZO machine for an L-Dex score. This helps prevent clinically significant lymphedema in 95% of patients. These follow up screens are 10 minute appointments that you are not billed for.  Otelia Limes, PTA 07/25/2021, 12:04 PM

## 2021-07-29 ENCOUNTER — Inpatient Hospital Stay (HOSPITAL_BASED_OUTPATIENT_CLINIC_OR_DEPARTMENT_OTHER): Payer: BC Managed Care – PPO | Admitting: Hematology & Oncology

## 2021-07-29 ENCOUNTER — Other Ambulatory Visit: Payer: Self-pay | Admitting: Oncology

## 2021-07-29 ENCOUNTER — Inpatient Hospital Stay: Payer: BC Managed Care – PPO | Attending: Hematology & Oncology

## 2021-07-29 ENCOUNTER — Encounter: Payer: Self-pay | Admitting: *Deleted

## 2021-07-29 VITALS — BP 117/58 | HR 58 | Temp 98.2°F | Resp 18 | Wt 136.1 lb

## 2021-07-29 DIAGNOSIS — C50911 Malignant neoplasm of unspecified site of right female breast: Secondary | ICD-10-CM | POA: Diagnosis present

## 2021-07-29 DIAGNOSIS — C50611 Malignant neoplasm of axillary tail of right female breast: Secondary | ICD-10-CM

## 2021-07-29 DIAGNOSIS — Z9011 Acquired absence of right breast and nipple: Secondary | ICD-10-CM | POA: Insufficient documentation

## 2021-07-29 DIAGNOSIS — Z79899 Other long term (current) drug therapy: Secondary | ICD-10-CM | POA: Insufficient documentation

## 2021-07-29 DIAGNOSIS — Z171 Estrogen receptor negative status [ER-]: Secondary | ICD-10-CM | POA: Diagnosis not present

## 2021-07-29 DIAGNOSIS — Z170 Estrogen receptor positive status [ER+]: Secondary | ICD-10-CM | POA: Diagnosis not present

## 2021-07-29 DIAGNOSIS — E7800 Pure hypercholesterolemia, unspecified: Secondary | ICD-10-CM

## 2021-07-29 DIAGNOSIS — E038 Other specified hypothyroidism: Secondary | ICD-10-CM

## 2021-07-29 LAB — CMP (CANCER CENTER ONLY)
ALT: 13 U/L (ref 0–44)
AST: 14 U/L — ABNORMAL LOW (ref 15–41)
Albumin: 4.5 g/dL (ref 3.5–5.0)
Alkaline Phosphatase: 85 U/L (ref 38–126)
Anion gap: 9 (ref 5–15)
BUN: 17 mg/dL (ref 6–20)
CO2: 30 mmol/L (ref 22–32)
Calcium: 10.3 mg/dL (ref 8.9–10.3)
Chloride: 105 mmol/L (ref 98–111)
Creatinine: 0.9 mg/dL (ref 0.44–1.00)
GFR, Estimated: >60 mL/min (ref >60)
Glucose, Bld: 106 mg/dL — ABNORMAL HIGH (ref 70–99)
Potassium: 4.5 mmol/L (ref 3.5–5.1)
Sodium: 144 mmol/L (ref 135–145)
Total Bilirubin: 0.5 mg/dL (ref 0.3–1.2)
Total Protein: 7 g/dL (ref 6.5–8.1)

## 2021-07-29 LAB — CBC WITH DIFFERENTIAL (CANCER CENTER ONLY)
Abs Immature Granulocytes: 0.01 K/uL (ref 0.00–0.07)
Basophils Absolute: 0 K/uL (ref 0.0–0.1)
Basophils Relative: 1 %
Eosinophils Absolute: 0.1 K/uL (ref 0.0–0.5)
Eosinophils Relative: 2 %
HCT: 42.4 % (ref 36.0–46.0)
Hemoglobin: 14.1 g/dL (ref 12.0–15.0)
Immature Granulocytes: 0 %
Lymphocytes Relative: 62 %
Lymphs Abs: 3.7 K/uL (ref 0.7–4.0)
MCH: 30.2 pg (ref 26.0–34.0)
MCHC: 33.3 g/dL (ref 30.0–36.0)
MCV: 90.8 fL (ref 80.0–100.0)
Monocytes Absolute: 0.4 K/uL (ref 0.1–1.0)
Monocytes Relative: 7 %
Neutro Abs: 1.7 K/uL (ref 1.7–7.7)
Neutrophils Relative %: 28 %
Platelet Count: 193 K/uL (ref 150–400)
RBC: 4.67 MIL/uL (ref 3.87–5.11)
RDW: 12.7 % (ref 11.5–15.5)
WBC Count: 6 K/uL (ref 4.0–10.5)
nRBC: 0 % (ref 0.0–0.2)

## 2021-07-29 LAB — VITAMIN D 25 HYDROXY (VIT D DEFICIENCY, FRACTURES): Vit D, 25-Hydroxy: 42.95 ng/mL (ref 30–100)

## 2021-07-29 LAB — LIPID PANEL
Cholesterol: 299 mg/dL — ABNORMAL HIGH (ref 0–200)
HDL: 51 mg/dL (ref >40)
LDL Cholesterol: 205 mg/dL — ABNORMAL HIGH (ref 0–99)
Total CHOL/HDL Ratio: 5.9 ratio
Triglycerides: 213 mg/dL — ABNORMAL HIGH (ref <150)
VLDL: 43 mg/dL — ABNORMAL HIGH (ref 0–40)

## 2021-07-29 LAB — TSH: TSH: 0.019 u[IU]/mL — ABNORMAL LOW (ref 0.350–4.500)

## 2021-07-29 MED ORDER — DEXAMETHASONE 4 MG PO TABS
8.0000 mg | ORAL_TABLET | Freq: Two times a day (BID) | ORAL | 1 refills | Status: DC
Start: 2021-07-29 — End: 2021-10-07

## 2021-07-29 NOTE — Progress Notes (Unsigned)
Please review cholesterol results!

## 2021-07-29 NOTE — Progress Notes (Signed)
Hematology and Oncology Follow Up Visit  Rachel [name] [facility] [id number] 12-Jan-1966 56 y.o. 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 is back for followup.  Unfortunately, we found that Rachel [name] had another cancer in the right breast.  Rachel [name] had follow-up mammogram in February.  This showed an abnormality or asymmetry in the right breast.  This was followed up with an ultrasound.  This was done in March.  This showed a 5 mm mass at 5 o'clock position and a 6 Miller mass at 6 o'clock position.  Rachel [name] underwent biopsies.  This was done on 05/17/2021.  The pathology report ([id number]) showed both abnormalities to be invasive ductal carcinoma.  They were both ER positive/PR positive and HER2 negative.  Rachel [name] then underwent a mastectomy.  This was done on 06/25/2021.  The pathology report ([id number]) showed an invasive ductal carcinoma measuring 1 cm.  There was a second invasive ductal carcinoma that was 6 mm.  3 lymph nodes were negative.  There was all margins negative.  The glandular differentiation was 3.  Unfortunately, the Oncotype score was high for both tumors.  For 1 tumor it was 25.  For the second tumor is 26.  Based on this, I really think that Rachel [name] is going to need adjuvant chemotherapy..  Rachel [name] also had a fairly high perforation rate of 30%.  I just hate the fact that we are now dealing with a no other cancer in the right breast.  I have to believe that this is a second breast cancer.  I think that Rachel [name] first breast cancer, which was stage I, was back in 22 years ago.  Rachel [name] comes in with Rachel [name] husband.  We had a long talk.  Again I think that Rachel [name] would benefit from adjuvant chemotherapy with 4 cycles of Taxotere/Cytoxan.  Rachel [name] is scheduled to have a Port-A-Cath  placed I think this week or next week.  I do think that Rachel [name] would benefit from adjuvant chemotherapy.  Otherwise, Rachel [name] is doing quite well.  Rachel [name] looks great.  Rachel [name] is active.  Rachel [name] overall performance status is ECOG 0.    Medications:  Current Outpatient Medications:    acetaminophen (TYLENOL) 500 MG tablet, Take 1,000 mg by mouth every 6 (six) hours as needed for moderate pain or mild pain., Disp: , Rfl:    butalbital-acetaminophen-caffeine (FIORICET, ESGIC) 50-325-40 MG per tablet, TAKE 1 TABLET BY MOUTH EVERY 6 HOURS AS NEEDED FOR HEADACHE, Disp: 20 tablet, Rfl: 0   Cholecalciferol (VITAMIN D PO), Take 1,000 Units by mouth daily., Disp: , Rfl:    fluticasone (FLONASE) 50 MCG/ACT nasal spray, INHALE 2 SPRASY IN EACH NOSTRIL ONCE DAILY (Patient taking differently: Place 2 sprays into both nostrils daily as needed for allergies or rhinitis.), Disp: 16 g, Rfl: 11   gabapentin (NEURONTIN) 100 MG capsule, Take 1 capsule (100 mg total) by mouth 2 (two) times daily. (Patient taking differently: Take 100 mg by mouth daily as needed (Nerve pain).), Disp: 30 capsule, Rfl: 1   ibuprofen (ADVIL) 200 MG tablet, Take 400-600 mg by mouth every 6 (six) hours as needed for mild pain or moderate pain., Disp: , Rfl:    methocarbamol (ROBAXIN) 500 MG tablet, Take 1 tablet (500 mg total) by mouth every 6 (six)  hours as needed for muscle spasms., Disp: 20 tablet, Rfl: 1   oxyCODONE (OXY IR/ROXICODONE) 5 MG immediate release tablet, Take 1 tablet (5 mg total) by mouth once as needed (for pain score of 1-4)., Disp: 30 tablet, Rfl: 0   pantoprazole (PROTONIX) 40 MG tablet, TAKE 1 TABLET BY MOUTH EVERY DAY (Patient taking differently: Take 40 mg by mouth daily as needed (Heartburn).), Disp: 90 tablet, Rfl: 3   promethazine (PHENERGAN) 25 MG tablet, TAKE 1 TABLET BY MOUTH EVERY 8 HOURS AS NEEDED FOR NAUSEA/VOMITING, Disp: 20 tablet, Rfl: 0   rizatriptan (MAXALT) 10 MG tablet, TAKE 1 TABLET BY MOUTH EVERY DAY AS NEEDED FOR  MIGRANE, Disp: 6 tablet, Rfl: 5   UNITHROID 112 MCG tablet, TAKE 1 TABLET BY MOUTH EVERY DAY BEFORE BREAKFAST, Disp: 90 tablet, Rfl: 1  Allergies:  Allergies  Allergen Reactions   Erythromycin Nausea And Vomiting   Anesthetics, Ester Nausea And Vomiting    Past Medical History, Surgical history, Social history, and Family History were reviewed and updated.  Review of Systems: Review of Systems  Constitutional: Negative.   HENT:  Positive for congestion.   Eyes: Negative.   Respiratory: Negative.    Cardiovascular: Negative.   Gastrointestinal: Negative.   Genitourinary: Negative.   Musculoskeletal: Negative.   Skin: Negative.   Neurological: Negative.   Endo/Heme/Allergies: Negative.   Psychiatric/Behavioral: Negative.       Physical Exam:  weight is 136 lb 1.9 oz (61.7 kg). Rachel [name] oral temperature is 98.2 F (36.8 C). Rachel [name] blood pressure is 117/58 (abnormal) and Rachel [name] pulse is 58 (abnormal). Rachel [name] respiration is 18 and oxygen saturation is 100%.   Physical Exam Vitals reviewed.  Constitutional:      Comments: Rachel [name] breast exam shows right mastectomy.  This is well-healed.  There is no nodularity.  There is a little bit of numbness along the mastectomy site.  There is no right axillary adenopathy.  Left breast is unremarkable.  There is no mass in the left breast.  There is no left axillary adenopathy.    HENT:     Head: Normocephalic and atraumatic.  Eyes:     Pupils: Pupils are equal, round, and reactive to light.  Cardiovascular:     Rate and Rhythm: Normal rate and regular rhythm.     Heart sounds: Normal heart sounds.  Pulmonary:     Effort: Pulmonary effort is normal.     Breath sounds: Normal breath sounds.  Abdominal:     General: Bowel sounds are normal.     Palpations: Abdomen is soft.  Musculoskeletal:        General: No tenderness or deformity. Normal range of motion.     Cervical back: Normal range of motion.  Lymphadenopathy:     Cervical: No cervical  adenopathy.  Skin:    General: Skin is warm and dry.     Findings: No erythema or rash.  Neurological:     Mental Status: Rachel [name] is alert and oriented to person, place, and time.  Psychiatric:        Behavior: Behavior normal.        Thought Content: Thought content normal.        Judgment: Judgment normal.     Lab Results  Component Value Date   WBC 6.0 07/29/2021   HGB 14.1 07/29/2021   HCT 42.4 07/29/2021   MCV 90.8 07/29/2021   PLT 193 07/29/2021     Chemistry      Component Value Date/Time  NA 144 07/29/2021 0804   NA 140 09/26/2016 0743   NA 143 09/21/2015 0854   K 4.5 07/29/2021 0804   K 3.8 09/26/2016 0743   K 4.4 09/21/2015 0854   CL 105 07/29/2021 0804   CL 104 09/26/2016 0743   CO2 30 07/29/2021 0804   CO2 30 09/26/2016 0743   CO2 28 09/21/2015 0854   BUN 17 07/29/2021 0804   BUN 14 09/26/2016 0743   BUN 13.9 09/21/2015 0854   CREATININE 0.90 07/29/2021 0804   CREATININE 0.87 01/01/2018 1055   CREATININE 0.8 09/21/2015 0854      Component Value Date/Time   CALCIUM 10.3 07/29/2021 0804   CALCIUM 9.4 09/26/2016 0743   CALCIUM 10.1 09/21/2015 0854   ALKPHOS 85 07/29/2021 0804   ALKPHOS 80 09/26/2016 0743   ALKPHOS 98 09/21/2015 0854   AST 14 (L) 07/29/2021 0804   AST 37 (H) 09/21/2015 0854   ALT 13 07/29/2021 0804   ALT 35 09/26/2016 0743   ALT 57 (H) 09/21/2015 0854   BILITOT 0.5 07/29/2021 0804   BILITOT 0.53 09/21/2015 0854      Impression and Plan: Rachel [name] is a 56 year old white female with a history of stage I ductal carcinoma of the right breast. Rachel [name] was diagnosed 22 years ago. Rachel [name] tumor is ER positive. I  think that  Rachel [name] is cured.  Now, Rachel [name] has a second breast cancer.  Again I feel this is not a recurrence in the breast.  I feel this is a second breast cancer.  Regardless, Rachel [name] is going to need adjuvant chemotherapy.  Rachel [name] will also need aromatase inhibitor therapy once we finished up with chemotherapy.  I see no role for radiation  therapy.  Again I went over side effects.  I told Rachel [name] that Rachel [name] will need to have a Neulasta injection.  Rachel [name] blood counts will take a hit with the white cells.  I would not think that Rachel [name] would need to be transfused.  Rachel [name] will also lose Rachel [name] hair, which I hate.  Rachel [name] is clearly in good shape.  We can be aggressive.  We will try to treat Rachel [name] with full dose on schedule.  We will try to go every 3 weeks for 4 cycles.   Volanda Napoleon, MD 6/19/20239:20 AM

## 2021-07-29 NOTE — Therapy (Signed)
OUTPATIENT PHYSICAL THERAPY BREAST CANCER TREATMENT NOTE   Patient Name: Rachel [name] MRN: [id number] DOB:[birth year], 56 y.o., female Today's Date: 07/30/2021   PT End of Session - 07/30/21 0859     Visit Number 4    Number of Visits 10    Date for PT Re-Evaluation 09/09/21    PT Start Time 0900    PT Stop Time 0949    PT Time Calculation (min) 49 min    Activity Tolerance Patient tolerated treatment well    Behavior During Therapy [facility] for tasks assessed/performed              Past Medical History:  Diagnosis Date   ASCUS of cervix with negative high risk HPV 03/2018   Cancer ([place]) 2001   HISTORY OF STAGE I INFILTRATING DUCTAL CARCINOMA OF RIGHT BREAST   Chiari malformation type I ([place])    Chronic headache    Family history of adverse reaction to anesthesia    Family history of uterine cancer    Hypercholesteremia    diet controlled   Hypothyroidism    on meds   Osteopenia 09/2018   T score -2.4.  Prior T score -2.5.  DEXA stable from prior study.   Personal history of chemotherapy 2001   Personal history of radiation therapy 2001   PONV (postoperative nausea and vomiting)    Past Surgical History:  Procedure Laterality Date   BREAST EXCISIONAL BIOPSY Left 2021   BREAST LUMPECTOMY Right 2001   CESAREAN SECTION  1991   COLONOSCOPY     MASTECTOMY W/ SENTINEL NODE BIOPSY Right 06/25/2021   Procedure: RIGHT MASTECTOMY WITH SENTINEL LYMPH NODE BIOPSY;  Surgeon: Stark Klein, MD;  Location: [place];  Service: General;  Laterality: Right;   NASAL SINUS SURGERY Right 02/20/2020   Procedure: ENDOSCOPIC SINUS SURGERY WITH RIGHT ETHMOIDECTOMY AND RIGHT MAXILLARY OSTIA ENLARGEMENT WITH FUSION;  Surgeon: Rozetta Nunnery, MD;  Location: [place];  Service: ENT;  Laterality: Right;   PELVIC LAPAROSCOPY     endometriosis   SINUS ENDO WITH FUSION Right 02/20/2020   Procedure: SINUS ENDO WITH FUSION;  Surgeon: Rozetta Nunnery, MD;   Location: [place];  Service: ENT;  Laterality: Right;   [place]   Patient Active Problem List   Diagnosis Date Noted   Cancer of overlapping sites of right female breast ([place]) 06/25/2021   Lateral epicondylitis, left elbow 03/04/2021   Left wrist pain 06/24/2019   Pain in joint of left shoulder 06/24/2019   Pain in joint of left elbow 06/24/2019   Family history of uterine cancer    Malignant neoplasm of axillary tail of right breast in female, estrogen receptor negative ([place]) 03/28/2016   Breast CA ([place]) 03/31/2011   ASCUS (atypical squamous cells of undetermined significance) on Pap smear    Hypothyroidism    Osteopenia    Hypercholesteremia     PCP:  Jenna Luo, MD  REFERRING PROVIDER: Dr. Barry Dienes  REFERRING DIAG: Rt breast cancer  THERAPY DIAG:  Malignant neoplasm of axillary tail of right breast in female, estrogen receptor negative ([place])  Abnormal posture  Aftercare following surgery for neoplasm  Rationale for Evaluation and Treatment Rehabilitation  ONSET DATE: 06/03/21  SUBJECTIVE:  SUBJECTIVE STATEMENT: I get my port on the 28th.   PERTINENT HISTORY:  Patient was diagnosed on 06/03/21 with right breast cancer recurrence. Initial lumpectomy, SLNB x 3, radiation, chemotherapy, and antiestrogens. Rt mastectomy on 06/25/21 with Dr. Barry Dienes with another SLNB 0/3 positive lymph nodes.  Other hx: chiari malformation   PATIENT GOALS:  Reassess how my recovery is going related to arm function, pain, and swelling.  PAIN:  Are you having pain? Yes: NPRS scale: 3/10 Pain location: pectoralis and axilla (mostly chest wall)   Pain description: tight, uncomfortable Aggravating factors: er, reaching away  from my body Relieving factors: medication, shower, rubbing the area, sleeping is getting better   PRECAUTIONS: Recent Surgery, right UE Lymphedema risk, 6 total lymph nodes removed x 2 breast cancers OBJECTIVE:  PATIENT SURVEYS:  QUICK DASH: 80%  OBSERVATIONS:  Very well healed very flat mastectomy incision. No edema or redness  Pectoralis axillary border tight with spasm during Abduction  POSTURE:  Slightly guarded  LYMPHEDEMA ASSESSMENT:   UPPER EXTREMITY AROM/PROM:   A/PROM RIGHT  06/17/2021   07/18/21 07/30/21  Shoulder extension 53    Shoulder flexion 170 145 - feeling tight 155  Shoulder abduction 170 114 130 - pectoralis muscle into chest  Shoulder internal rotation      Shoulder external rotation 90                            (Blank rows = not tested)   CERVICAL AROM: All within normal limits:    LYMPHEDEMA ASSESSMENTS:    [facility] RIGHT  06/17/2021 6/8/23Rt 6/8/23Lt  10 cm proximal to olecranon process 36.5 36.5   Olecranon process 23.5 23.5   10 cm proximal to ulnar styloid process 20.6 20.6   Just proximal to ulnar styloid process 15.3 15.3   Across hand at thumb web space 18 18   At base of 2nd digit 5.8 5.8   (Blank rows = not tested)  TODAY'S TREATMENT: 07/30/21 Therapeutic Exs:  Pulleys: x2 mins into flex and scaption with pt returning therapist demo and VCs during to relax  Ball roll up wall into flexion x10 with VCs to relax shoulders and only push into tolerate stretch  Supine arms in T LTR x 5 bil   Manual Therapy:  STM in supine to Rt chest wall and pectoralis insertion MFR to Rt axilla during P/ROM not tolerated as well today due to pectoralis spasm P/ROM in supine to Rt shoulder to per tolerance into flexion, abd and D2 along with er    07/25/21:   Therapeutic Exs:   Pulleys: x2 mins into flex and scaption with pt returning therapist demo and VCs during to relax   Ball roll up wall into flexion x10 with VCs to relax shoulders and only push into  tolerate stretch   Manual Therapy:   STM in supine to Rt chest wall and pectoralis insertion  MFR to Rt axilla during P/ROM  P/ROM in supine to Rt shoulder to per tolerance into flexion, abd and D2 along with er   07/18/21: Standing dowel abduction/scaption x 6 each with instruction and cueing to keep arm relaxed  Supine STM to pectoralis  with gentle pressure and PROM into flexion and abduction stopping with any pectoralis spasm  PATIENT EDUCATION:  Education details: updated HEP, POC Person educated: Patient Education method: Explanation, Demonstration, Tactile cues, and Handouts Education comprehension: verbalized understanding, returned demonstration, verbal cues required, tactile cues required, and  needs further education  HOME EXERCISE PROGRAM:  Reviewed previously given post op HEP with changes as needed:  Access Code: [email or web address] URL: [email or web address] Date: 07/18/2021 Prepared by: Shan Levans  Exercises - Supine Shoulder Flexion AAROM with Hands Clasped - 1-2 x daily - 7 x weekly - 1 sets - 10 reps - 5 second hold - Supine Chest Stretch with Elbows Bent - 1 x daily - 7 x weekly - 1 sets - 3 reps - 30-60seconds hold - Standing Shoulder Abduction AAROM with Dowel - 1-2 x daily - 7 x weekly - 1 sets - 10 reps - 5 second hold - Supine Thoracic Mobilization Towel Roll Vertical with Arm Stretch - 2 x daily - 7 x weekly - 1 sets - 1 reps - 20-30 seconds hold  ASSESSMENT:  CLINICAL IMPRESSION: Pt struggling with pectoralis spasm after not taking mm relaxer x1 week or any pain medication.  AROM is improving but limited abduction.  Pt gets port next week so we rearranged some appointments.   Pt will benefit from skilled therapeutic intervention to improve on the following deficits: Decreased knowledge of precautions, impaired UE functional use, pain, decreased ROM, postural dysfunction.   PT treatment/interventions: ADL/Self care home management, Therapeutic exercises,  Patient/Family education, and Manual therapy   GOALS: Goals reviewed with patient? Yes  LONG TERM GOALS:  (STG=LTG)  GOALS Name Target Date  Goal status  1 Pt will demonstrate she has regained full shoulder ROM and function post operatively compared to baselines.  Baseline: 08/15/2021 IN PROGRESS  2 Pt will be educated on lymphedema risk reduction- ABC class - and SOZO surveillance 08/15/21 INITIAL  3 Pt will feel ind with continued HEP 08/15/21 INITIAL          PLAN: PT FREQUENCY/DURATION: 1-2x per week for 4 weeks  PLAN FOR NEXT SESSION: Begin inperson education for ABC class as pt will not be able to make the next one, and make sure SOZO is scheduled; cont   PROM and STM Rt UE, AAROM activities   [facility]  [address], [address]  [place] [address]  [phone]  After Breast Cancer Class It is recommended you attend the ABC class to be educated on lymphedema risk reduction. This class is free of charge and lasts for 1 hour. It is a 1-time class. You will need to download the Webex app either on your phone or computer. We will send you a link the night before or the morning of the class. You should be able to click on that link to join the class. This is not a confidential class. You don't have to turn your camera on, but other participants may be able to see your email address.  Scar massage You can begin gentle scar massage to you incision sites. Gently place one hand on the incision and move the skin (without sliding on the skin) in various directions. Do this for a few minutes and then you can gently massage either coconut oil or vitamin E cream into the scars.  Compression garment You should continue wearing your compression bra until you feel like you no longer have swelling.  Home exercise Program Continue doing the exercises you were given until you feel like you can do them without feeling any tightness at the end.   Walking Program Studies  show that 30 minutes of walking per day (fast enough to elevate your heart rate) can significantly reduce the risk of a cancer recurrence. If you  can't walk due to other medical reasons, we encourage you to find another activity you could do (like a stationary bike or water exercise).  Posture After breast cancer surgery, people frequently sit with rounded shoulders posture because it puts their incisions on slack and feels better. If you sit like this and scar tissue forms in that position, you can become very tight and have pain sitting or standing with good posture. Try to be aware of your posture and sit and stand up tall to heal properly.  Follow up PT: It is recommended you return every 3 months for the first 3 years following surgery to be assessed on the SOZO machine for an L-Dex score. This helps prevent clinically significant lymphedema in 95% of patients. These follow up screens are 10 minute appointments that you are not billed for.  Stark Bray, PT 07/30/2021, 9:52 AM

## 2021-07-29 NOTE — Progress Notes (Signed)
START ON PATHWAY REGIMEN - Breast     A cycle is every 21 days:     Docetaxel      Cyclophosphamide   **Always confirm dose/schedule in your pharmacy ordering system**  Patient Characteristics: Postoperative without Neoadjuvant Therapy (Pathologic Staging), Invasive Disease, Adjuvant Therapy, HER2 Negative/Unknown/Equivocal, ER Positive, Node Negative, pT1a, pN68m or pT1b-c, pN0/N151mor pT2 or Higher, pN0, Oncotype High Risk (? 26) Therapeutic Status: Postoperative without Neoadjuvant Therapy (Pathologic Staging) AJCC Grade: G2 AJCC N Category: pN0 AJCC M Category: cM0 ER Status: Positive (+) AJCC 8 Stage Grouping: IA HER2 Status: Negative (-) Oncotype Dx Recurrence Score: 26 AJCC T Category: pT1c PR Status: Positive (+) Adjuvant Therapy Status: No Adjuvant Therapy Received Yet or Changing Initial Adjuvant Regimen due to Tolerance Has this patient completed genomic testing<= Yes - Oncotype DX(R) Intent of Therapy: Curative Intent, Discussed with Patient

## 2021-07-29 NOTE — Patient Instructions (Signed)
DUE TO SPACE LIMITATIONS, ONLY TWO VISITORS  (aged 55 and older) ARE ALLOWED TO COME WITH YOU AND STAY IN THE WAITING ROOM DURING YOUR PRE OP AND PROCEDURE.   **NO VISITORS ARE ALLOWED IN THE SHORT STAY AREA OR RECOVERY ROOM!!**  You are not required to quarantine at this time prior to your surgery. However, you must do this: Hand Hygiene often Do NOT share personal items Notify your provider if you are in close contact with someone who has COVID or you develop fever 100.4 or greater, new onset of sneezing, cough, sore throat, shortness of breath or body aches.       Your procedure is scheduled on:  JUNE 28,2023  Report to [facility] Main Entrance.  Report to admitting at:  06:15 AM  +++++Call this number if you have any questions or problems the morning of surgery [phone]  Do not eat food :After Midnight the night prior to your surgery/procedure.  After Midnight you may have the following liquids until 05:30 AM DAY OF SURGERY  Clear Liquid Diet Water Black Coffee (sugar ok, NO MILK/CREAM OR CREAMERS)  Tea (sugar ok, NO MILK/CREAM OR CREAMERS) regular and decaf                             Plain Jell-O (NO RED)                                           Fruit ices (not with fruit pulp, NO RED)                                     Popsicles (NO RED)                                                                  Juice: apple, WHITE grape, WHITE cranberry Sports drinks like Gatorade (NO RED) Clear broth(vegetable,chicken,beef)                   The day of surgery:  Drink ONE (1) Pre-Surgery Clear Ensure at   05:30 AM the morning of surgery. Drink in one sitting. Do not sip.  This drink was given to you during your hospital  pre-op appointment visit. Nothing else to drink after completing the Pre-Surgery Clear Ensure.   If you have questions, please contact your surgeon's office.   FOLLOW ANY ADDITIONAL PRE OP INSTRUCTIONS YOU RECEIVED FROM YOUR SURGEON'S  OFFICE!!!   Oral Hygiene is also important to reduce your risk of infection.        Remember - BRUSH YOUR TEETH THE MORNING OF SURGERY WITH YOUR REGULAR TOOTHPASTE  Do NOT smoke after Midnight the night before surgery.  Take ONLY these medicines the morning of surgery with A SIP OF WATER: Unithroid,  If Needed you may take Oxycodone, Tylenol, Fioricet, Gabapentin, Phenergan, Flonase, Maxalt, Protonix                   You may not have any metal on your body including hair pins,  jewelry, and body piercing  Do not wear make-up, lotions, powders, perfumes or deodorant  Do not wear nail polish including gel and S&S, artificial / acrylic nails, or any other type of covering on natural nails including finger and toenails. If you have artificial nails, gel coating, etc., that needs to be removed by a nail salon, Please have this removed prior to surgery. Not doing so may mean that your surgery could be cancelled or delayed if the Surgeon or anesthesia staff feels like they are unable to monitor you safely.   Do not shave 48 hours prior to surgery to avoid nicks in your skin which may contribute to postoperative infections.   Contacts, Hearing Aids, dentures or bridgework may not be worn into surgery.   DO NOT [place]. PHARMACY WILL DISPENSE MEDICATIONS LISTED ON YOUR MEDICATION LIST TO YOU DURING YOUR ADMISSION [place]!   Patients discharged on the day of surgery will not be allowed to drive home.  Someone NEEDS to stay with you for the first 24 hours after anesthesia.  Special Instructions: Bring a copy of your healthcare power of attorney and living will documents the day of surgery, if you wish to have them scanned into your [place] Medical Records- EPIC  Please read over the following fact sheets you were given: IF YOU HAVE QUESTIONS ABOUT YOUR PRE-OP INSTRUCTIONS, PLEASE CALL [phone] ([place])  [place] - Preparing for Surgery Before  surgery, you can play an important role.  Because skin is not sterile, your skin needs to be as free of germs as possible.  You can reduce the number of germs on your skin by washing with CHG (chlorahexidine gluconate) soap before surgery.  CHG is an antiseptic cleaner which kills germs and bonds with the skin to continue killing germs even after washing. Please DO NOT use if you have an allergy to CHG or antibacterial soaps.  If your skin becomes reddened/irritated stop using the CHG and inform your nurse when you arrive at Short Stay. Do not shave (including legs and underarms) for at least 48 hours prior to the first CHG shower.  You may shave your face/neck.  Please follow these instructions carefully:  1.  Shower with CHG Soap the night before surgery and the  morning of surgery.  2.  If you choose to wash your hair, wash your hair first as usual with your normal  shampoo.  3.  After you shampoo, rinse your hair and body thoroughly to remove the shampoo.                             4.  Use CHG as you would any other liquid soap.  You can apply chg directly to the skin and wash.  Gently with a scrungie or clean washcloth.  5.  Apply the CHG Soap to your body ONLY FROM THE NECK DOWN.   Do not use on face/ open                           Wound or open sores. Avoid contact with eyes, ears mouth and genitals (private parts).                       Wash face,  Genitals (private parts) with your normal soap.  6.  Wash thoroughly, paying special attention to the area where your  surgery  will be performed.  7.  Thoroughly rinse your body with warm water from the neck down.  8.  DO NOT shower/wash with your normal soap after using and rinsing off the CHG Soap.            9.  Pat yourself dry with a clean towel.            10.  Wear clean pajamas.            11.  Place clean sheets on your bed the night of your first shower and do not  sleep with pets.  ON THE DAY OF SURGERY : Do not apply  any lotions/deodorants the morning of surgery.  Please wear clean clothes to the hospital/surgery center.    FAILURE TO FOLLOW THESE INSTRUCTIONS MAY RESULT IN THE CANCELLATION OF YOUR SURGERY  PATIENT SIGNATURE_________________________________  NURSE SIGNATURE__________________________________  ________________________________________________________________________

## 2021-07-30 ENCOUNTER — Encounter: Payer: Self-pay | Admitting: *Deleted

## 2021-07-30 ENCOUNTER — Encounter: Payer: Self-pay | Admitting: Rehabilitation

## 2021-07-30 ENCOUNTER — Encounter (HOSPITAL_COMMUNITY): Payer: Self-pay

## 2021-07-30 ENCOUNTER — Ambulatory Visit: Payer: BC Managed Care – PPO | Admitting: Rehabilitation

## 2021-07-30 DIAGNOSIS — Z171 Estrogen receptor negative status [ER-]: Secondary | ICD-10-CM

## 2021-07-30 DIAGNOSIS — R293 Abnormal posture: Secondary | ICD-10-CM

## 2021-07-30 DIAGNOSIS — Z483 Aftercare following surgery for neoplasm: Secondary | ICD-10-CM

## 2021-07-30 DIAGNOSIS — C50611 Malignant neoplasm of axillary tail of right female breast: Secondary | ICD-10-CM | POA: Diagnosis not present

## 2021-07-30 NOTE — Progress Notes (Signed)
I spoke with Rachel [name] this moring. Per Rachel [name], understands and appreciate your concerns. Rachel [name] also understands her health risks, however, at this time Rachel [name] refuse to go on meds per Dr. Montine Circle.   Advice Rachel [name] that should she change her mind, to contact the office, Rachel [name] voiced understanding.

## 2021-07-30 NOTE — Progress Notes (Signed)
COVID Vaccine Completed: Yes  Date of COVID positive in last 90 days:  PCP - Jenna Luo, MD Cardiologist -   Chest x-ray -  EKG -  Stress Test -  ECHO -  Cardiac Cath -  Pacemaker/ICD device last checked: Spinal Cord Stimulator:  Bowel Prep -   Sleep Study -  CPAP -   Fasting Blood Sugar -  Checks Blood Sugar _____ times a day  Blood Thinner Instructions: Aspirin Instructions: Last Dose:  Activity level:  Can go up a flight of stairs and perform activities of daily living without stopping and without symptoms of chest pain or shortness of breath.  Able to exercise without symptoms  Unable to go up a flight of stairs without symptoms of     Anesthesia review:   Patient denies shortness of breath, fever, cough and chest pain at PAT appointment  Patient verbalized understanding of instructions that were given to them at the PAT appointment. Patient was also instructed that they will need to review over the PAT instructions again at home before surgery.

## 2021-07-30 NOTE — Progress Notes (Signed)
Patient seen after her surgery. With high oncotype score she will now proceed to systemic therapy.   Her port is already scheduled for 08/07/2021 and Dr Marin Olp plans to start chemo on 08/16/2021. She will need chemo education. Message sent to patient via MyChart requesting input for chemo education scheduling. Will schedule once response is received.   Oncology Nurse Navigator Documentation     07/30/2021    2:30 PM  Oncology Nurse Navigator Flowsheets  Navigator Follow Up Date: 08/02/2021  Navigator Follow Up Reason: Appointment Review  Navigator Location [facility]  Navigator Encounter Type Appt/Treatment Plan Review;MyChart  Patient Visit Type MedOnc  Treatment Phase Active Tx  Barriers/Navigation Needs Coordination of Care;Education  Education Other  Interventions Education  Education Method Written  Support Groups/Services Friends and Family  Time Spent with Patient 15

## 2021-07-31 ENCOUNTER — Encounter (HOSPITAL_COMMUNITY)
Admission: RE | Admit: 2021-07-31 | Discharge: 2021-07-31 | Disposition: A | Discharge status: Home / Self Care | Payer: BC Managed Care – PPO | Source: Ambulatory Visit | Attending: General Surgery | Admitting: General Surgery

## 2021-07-31 ENCOUNTER — Other Ambulatory Visit: Payer: Self-pay

## 2021-07-31 ENCOUNTER — Encounter (HOSPITAL_COMMUNITY): Payer: Self-pay

## 2021-07-31 DIAGNOSIS — Z01818 Encounter for other preprocedural examination: Secondary | ICD-10-CM | POA: Insufficient documentation

## 2021-07-31 HISTORY — DX: Pneumonia, unspecified organism: J18.9

## 2021-07-31 NOTE — Progress Notes (Signed)
COVID Vaccine Completed: Yes   Date of COVID positive in last 90 days: N/A   PCP - Jenna Luo, MD Cardiologist - none   Chest x-ray - >2 yrs Epic EKG - > 2 Yrs  Epic Stress Test - N/A ECHO - N/A Cardiac Cath - N/A  Pacemaker/ICD device last checked: N/A Spinal Cord Stimulator: N/A   Sleep Study - N/A CPAP -    Fasting Blood Sugar -  N/A Checks Blood Sugar _____ times a day   Blood Thinner Instructions: N/A Aspirin Instructions: Last Dose:   Activity level:   Can go up a flight of stairs and perform activities of daily living without stopping and without symptoms of chest pain or shortness of breath.   Able to exercise without symptoms                  Anesthesia review:  Hx of PONV   Patient denies shortness of breath, fever, cough and chest pain at PAT appointment   Patient verbalized understanding of instructions that were given to them at the PAT appointment. Patient was also instructed that they will need to review over the PAT instructions again at home before surgery.          Note Details  Author Blenda Peals, RN File Time 07/30/2021  7:50 AM  Author Type Registered Nurse Status Signed  Last Editor Blenda Peals, RN Service (none)  [place] # [phone] Admit Date 07/31/2021

## 2021-08-01 ENCOUNTER — Encounter: Payer: Self-pay | Admitting: Rehabilitation

## 2021-08-01 ENCOUNTER — Encounter: Payer: Self-pay | Admitting: *Deleted

## 2021-08-01 ENCOUNTER — Ambulatory Visit: Payer: BC Managed Care – PPO | Admitting: Rehabilitation

## 2021-08-01 DIAGNOSIS — C50611 Malignant neoplasm of axillary tail of right female breast: Secondary | ICD-10-CM | POA: Diagnosis not present

## 2021-08-01 DIAGNOSIS — Z483 Aftercare following surgery for neoplasm: Secondary | ICD-10-CM

## 2021-08-01 DIAGNOSIS — M25611 Stiffness of right shoulder, not elsewhere classified: Secondary | ICD-10-CM

## 2021-08-01 NOTE — Therapy (Signed)
OUTPATIENT PHYSICAL THERAPY BREAST CANCER TREATMENT NOTE   Patient Name: Rachel [name] MRN: [id number] DOB:September 27, 1965, 56 y.o., female Today's Date: 08/01/2021   PT End of Session - 08/01/21 1003     Visit Number 5    Number of Visits 10    Date for PT Re-Evaluation 09/09/21    PT Start Time 1004    PT Stop Time 1040    PT Time Calculation (min) 36 min    Activity Tolerance Patient tolerated treatment well    Behavior During Therapy [facility] for tasks assessed/performed              Past Medical History:  Diagnosis Date   ASCUS of cervix with negative high risk HPV 03/2018   Cancer ([place]) 2001   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  Prior T score -2.5.  DEXA stable from prior study.   Personal history of chemotherapy 2001   Personal history of radiation therapy 2001   Pneumonia    Had 12-2017   PONV (postoperative nausea and vomiting)    Past Surgical History:  Procedure Laterality Date   BREAST EXCISIONAL BIOPSY Left 2021   BREAST LUMPECTOMY Right 2001   CESAREAN SECTION  1991   COLONOSCOPY     MASTECTOMY W/ SENTINEL NODE BIOPSY Right 06/25/2021   Procedure: RIGHT MASTECTOMY WITH SENTINEL LYMPH NODE BIOPSY;  Surgeon: Stark Klein, MD;  Location: [place];  Service: General;  Laterality: Right;   NASAL SINUS SURGERY Right 02/20/2020   Procedure: ENDOSCOPIC SINUS SURGERY WITH RIGHT ETHMOIDECTOMY AND RIGHT MAXILLARY OSTIA ENLARGEMENT WITH FUSION;  Surgeon: Rozetta Nunnery, MD;  Location: [place];  Service: ENT;  Laterality: Right;   PELVIC LAPAROSCOPY     endometriosis   SINUS ENDO WITH FUSION Right 02/20/2020   Procedure: SINUS ENDO WITH FUSION;   Surgeon: Rozetta Nunnery, MD;  Location: [place];  Service: ENT;  Laterality: Right;   [place]   Patient Active Problem List   Diagnosis Date Noted   Cancer of overlapping sites of right female breast ([place]) 06/25/2021   Lateral epicondylitis, left elbow 03/04/2021   Left wrist pain 06/24/2019   Pain in joint of left shoulder 06/24/2019   Pain in joint of left elbow 06/24/2019   Family history of uterine cancer    Malignant 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 Barry Dienes  REFERRING DIAG: Rt breast cancer  THERAPY DIAG:  Aftercare following surgery for neoplasm  Stiffness of right shoulder, not elsewhere classified  Rationale for Evaluation and Treatment Rehabilitation  ONSET DATE: 06/03/21  SUBJECTIVE:  SUBJECTIVE STATEMENT: I need to show you some new redness that I noticed this morning  PERTINENT HISTORY:  Patient was diagnosed on 06/03/21 with right breast cancer recurrence. Initial lumpectomy, SLNB x 3, radiation, chemotherapy, and antiestrogens. Rt mastectomy on 06/25/21 with Dr. Barry Dienes with another SLNB 0/3 positive lymph nodes.  Other hx: chiari malformation   PATIENT GOALS:  Reassess how my recovery is going related to arm function, pain, and swelling.  PAIN:  Are you having pain? Yes: NPRS scale: 1/10 Pain location: pectoralis and axilla (mostly chest wall)   Pain description: tight, uncomfortable Aggravating factors: er,  reaching away from my body Relieving factors: medication, shower, rubbing the area, sleeping is getting better   PRECAUTIONS: Recent Surgery, right UE Lymphedema risk, 6 total lymph nodes removed x 2 breast cancers OBJECTIVE:  PATIENT SURVEYS:  QUICK DASH: 80%  OBSERVATIONS:  Very well healed very flat mastectomy incision. No edema or redness  Pectoralis axillary border tight with spasm during Abduction  POSTURE:  Slightly guarded  LYMPHEDEMA ASSESSMENT:   UPPER EXTREMITY AROM/PROM:   A/PROM RIGHT  06/17/2021   07/18/21 07/30/21  Shoulder extension 53    Shoulder flexion 170 145 - feeling tight 155  Shoulder abduction 170 114 130 - pectoralis muscle into chest  Shoulder internal rotation      Shoulder external rotation 90                            (Blank rows = not tested)   CERVICAL AROM: All within normal limits:    LYMPHEDEMA ASSESSMENTS:    [facility] RIGHT  06/17/2021 6/8/23Rt 6/8/23Lt  10 cm proximal to olecranon process 36.5 36.5   Olecranon process 23.5 23.5   10 cm proximal to ulnar styloid process 20.6 20.6   Just proximal to ulnar styloid process 15.3 15.3   Across hand at thumb web space 18 18   At base of 2nd digit 5.8 5.8   (Blank rows = not tested)  TODAY'S TREATMENT: 08/01/21 Pt has some new redness along the incision which normally would look unconcerning but as she has not had this redness at all x 5 weeks and pt reports it was larger than this earlier I sent a note to Dr. Barry Dienes for possible follow up.  Therapeutic Exs:  Pulleys: x2 mins into flex and scaption with pt returning therapist demo and VCs during to relax  Ball roll up wall into flexion x10 with VCs to relax shoulders and only push into tolerate stretch  Supine dowel flexion x 10   Manual Therapy:  STM in supine to Rt chest wall and pectoralis insertion held due to new redness P/ROM in supine to Rt shoulder to per tolerance into flexion, abd and D2 along with er Sent picture to Dr. Barry Dienes  regarding new redness at the incision - photo from today   07/30/21 Therapeutic Exs:  Pulleys: x2 mins into flex and scaption with pt returning therapist demo and VCs during to relax  Ball roll up wall into flexion x10 with VCs to relax shoulders and only push into tolerate stretch  Supine arms in T LTR x 5 bil   Manual Therapy:  STM in supine to Rt chest wall and pectoralis insertion MFR to Rt axilla during P/ROM not tolerated as well today due to pectoralis spasm P/ROM in supine to Rt shoulder to per tolerance into flexion, abd and D2 along with er    07/25/21:  Therapeutic Exs:   Pulleys: x2 mins into flex and scaption with pt returning therapist demo and VCs during to relax   Ball roll up wall into flexion x10 with VCs to relax shoulders and only push into tolerate stretch   Manual Therapy:   STM in supine to Rt chest wall and pectoralis insertion  MFR to Rt axilla during P/ROM  P/ROM in supine to Rt shoulder to per tolerance into flexion, abd and D2 along with er  PATIENT EDUCATION:  Education details: updated HEP, POC Person educated: Patient Education method: Explanation, Demonstration, Tactile cues, and Handouts Education comprehension: verbalized understanding, returned demonstration, verbal cues required, tactile cues required, and needs further education  HOME EXERCISE PROGRAM:  Reviewed previously given post op HEP with changes as needed:  Access Code: [email or web address] URL: [email or web address] Date: 07/18/2021 Prepared by: Shan Levans  Exercises - Supine Shoulder Flexion AAROM with Hands Clasped - 1-2 x daily - 7 x weekly - 1 sets - 10 reps - 5 second hold - Supine Chest Stretch with Elbows Bent - 1 x daily - 7 x weekly - 1 sets - 3 reps - 30-60seconds hold - Standing Shoulder Abduction AAROM with Dowel - 1-2 x daily - 7 x weekly - 1 sets - 10 reps - 5 second hold - Supine Thoracic Mobilization Towel Roll Vertical with Arm Stretch - 2 x daily - 7 x weekly - 1  sets - 1 reps - 20-30 seconds hold  ASSESSMENT:  CLINICAL IMPRESSION: Pt did return to having muscle relaxer and today PROM into abduction and ER was less guarded.  Held on MT except for PROM today until we hear about follow up.   Pt will benefit from skilled therapeutic intervention to improve on the following deficits: Decreased knowledge of precautions, impaired UE functional use, pain, decreased ROM, postural dysfunction.   PT treatment/interventions: ADL/Self care home management, Therapeutic exercises, Patient/Family education, and Manual therapy   GOALS: Goals reviewed with patient? Yes  LONG TERM GOALS:  (STG=LTG)  GOALS Name Target Date  Goal status  1 Pt will demonstrate she has regained full shoulder ROM and function post operatively compared to baselines.  Baseline: 08/15/2021 IN PROGRESS  2 Pt will be educated on lymphedema risk reduction- ABC class - and SOZO surveillance 08/15/21 INITIAL  3 Pt will feel ind with continued HEP 08/15/21 INITIAL          PLAN: PT FREQUENCY/DURATION: 1-2x per week for 4 weeks  PLAN FOR NEXT SESSION: Begin inperson education for ABC class as pt will not be able to make the next one, and make sure SOZO is scheduled; cont   PROM and STM Rt UE, AAROM activities   [facility]  [address], [address]  [place] [address]  [phone]  After Breast Cancer Class It is recommended you attend the ABC class to be educated on lymphedema risk reduction. This class is free of charge and lasts for 1 hour. It is a 1-time class. You will need to download the Webex app either on your phone or computer. We will send you a link the night before or the morning of the class. You should be able to click on that link to join the class. This is not a confidential class. You don't have to turn your camera on, but other participants may be able to see your email address.  Scar massage You can begin gentle scar massage to you incision  sites. Gently place one hand on  the incision and move the skin (without sliding on the skin) in various directions. Do this for a few minutes and then you can gently massage either coconut oil or vitamin E cream into the scars.  Compression garment You should continue wearing your compression bra until you feel like you no longer have swelling.  Home exercise Program Continue doing the exercises you were given until you feel like you can do them without feeling any tightness at the end.   Walking Program Studies show that 30 minutes of walking per day (fast enough to elevate your heart rate) can significantly reduce the risk of a cancer recurrence. If you can't walk due to other medical reasons, we encourage you to find another activity you could do (like a stationary bike or water exercise).  Posture After breast cancer surgery, people frequently sit with rounded shoulders posture because it puts their incisions on slack and feels better. If you sit like this and scar tissue forms in that position, you can become very tight and have pain sitting or standing with good posture. Try to be aware of your posture and sit and stand up tall to heal properly.  Follow up PT: It is recommended you return every 3 months for the first 3 years following surgery to be assessed on the SOZO machine for an L-Dex score. This helps prevent clinically significant lymphedema in 95% of patients. These follow up screens are 10 minute appointments that you are not billed for.  Stark Bray, PT 08/01/2021, 10:46 AM

## 2021-08-01 NOTE — Progress Notes (Signed)
Chemo education scheduled for 08/15/2021. Chemo educator notified.   Oncology Nurse Navigator Documentation     08/01/2021    1:00 PM  Oncology Nurse Navigator Flowsheets  Navigator Follow Up Date: 08/16/2021  Navigator Follow Up Reason: Chemotherapy  Navigator Location [facility]  Navigator Encounter Type MyChart  Patient Visit Type MedOnc  Treatment Phase Active Tx  Barriers/Navigation Needs Coordination of Care;Education  Education Other  Interventions Coordination of Care;Education  Acuity Level 2-Minimal Needs (1-2 Barriers Identified)  Coordination of Care Appts  Education Method Written  Support Groups/Services Friends and Family  Time Spent with Patient 30

## 2021-08-02 ENCOUNTER — Encounter: Payer: Self-pay | Admitting: Rehabilitation

## 2021-08-06 ENCOUNTER — Ambulatory Visit: Payer: BC Managed Care – PPO | Admitting: Rehabilitation

## 2021-08-06 ENCOUNTER — Encounter: Payer: Self-pay | Admitting: Rehabilitation

## 2021-08-06 ENCOUNTER — Other Ambulatory Visit: Payer: Self-pay | Admitting: *Deleted

## 2021-08-06 DIAGNOSIS — Z483 Aftercare following surgery for neoplasm: Secondary | ICD-10-CM

## 2021-08-06 DIAGNOSIS — R293 Abnormal posture: Secondary | ICD-10-CM

## 2021-08-06 DIAGNOSIS — M25611 Stiffness of right shoulder, not elsewhere classified: Secondary | ICD-10-CM

## 2021-08-06 DIAGNOSIS — C50611 Malignant neoplasm of axillary tail of right female breast: Secondary | ICD-10-CM

## 2021-08-06 DIAGNOSIS — C50811 Malignant neoplasm of overlapping sites of right female breast: Secondary | ICD-10-CM

## 2021-08-06 MED ORDER — PROCHLORPERAZINE MALEATE 10 MG PO TABS: 10.0000 mg | ORAL_TABLET | Freq: Four times a day (QID) | ORAL | 1 refills | Status: DC | PRN

## 2021-08-06 MED ORDER — LIDOCAINE-PRILOCAINE 2.5-2.5 % EX CREA: TOPICAL_CREAM | CUTANEOUS | 3 refills | Status: DC

## 2021-08-06 MED ORDER — ONDANSETRON HCL 8 MG PO TABS: 8.0000 mg | ORAL_TABLET | Freq: Two times a day (BID) | ORAL | 1 refills | Status: DC | PRN

## 2021-08-07 ENCOUNTER — Encounter (HOSPITAL_COMMUNITY)
Admission: RE | Disposition: A | Discharge status: Home / Self Care | Payer: Self-pay | Source: Home / Self Care | Attending: General Surgery

## 2021-08-07 ENCOUNTER — Other Ambulatory Visit: Payer: Self-pay

## 2021-08-07 ENCOUNTER — Ambulatory Visit (HOSPITAL_COMMUNITY): Payer: BC Managed Care – PPO | Admitting: Anesthesiology

## 2021-08-07 ENCOUNTER — Ambulatory Visit (HOSPITAL_COMMUNITY): Payer: BC Managed Care – PPO

## 2021-08-07 ENCOUNTER — Encounter (HOSPITAL_COMMUNITY): Payer: Self-pay | Admitting: General Surgery

## 2021-08-07 ENCOUNTER — Ambulatory Visit (HOSPITAL_COMMUNITY)
Admission: RE | Admit: 2021-08-07 | Discharge: 2021-08-07 | Disposition: A | Discharge status: Home / Self Care | Payer: BC Managed Care – PPO | Attending: General Surgery | Admitting: General Surgery

## 2021-08-07 DIAGNOSIS — Z8049 Family history of malignant neoplasm of other genital organs: Secondary | ICD-10-CM | POA: Insufficient documentation

## 2021-08-07 DIAGNOSIS — Z801 Family history of malignant neoplasm of trachea, bronchus and lung: Secondary | ICD-10-CM | POA: Diagnosis not present

## 2021-08-07 DIAGNOSIS — Z170 Estrogen receptor positive status [ER+]: Secondary | ICD-10-CM | POA: Diagnosis not present

## 2021-08-07 DIAGNOSIS — C50811 Malignant neoplasm of overlapping sites of right female breast: Secondary | ICD-10-CM | POA: Insufficient documentation

## 2021-08-07 DIAGNOSIS — Z9011 Acquired absence of right breast and nipple: Secondary | ICD-10-CM | POA: Diagnosis not present

## 2021-08-07 HISTORY — PX: PORTACATH PLACEMENT: SHX2246

## 2021-08-07 SURGERY — INSERTION, TUNNELED CENTRAL VENOUS DEVICE, WITH PORT
Anesthesia: General

## 2021-08-07 MED ORDER — BUPIVACAINE-EPINEPHRINE 0.25% -1:200000 IJ SOLN
INTRAMUSCULAR | Status: AC
  Filled 2021-08-07: qty 1

## 2021-08-07 MED ORDER — OXYCODONE HCL 5 MG PO TABS
ORAL_TABLET | ORAL | Status: AC
  Filled 2021-08-07: qty 1

## 2021-08-07 MED ORDER — DEXAMETHASONE SODIUM PHOSPHATE 10 MG/ML IJ SOLN
INTRAMUSCULAR | Status: DC | PRN
  Administered 2021-08-07: 8 mg via INTRAVENOUS

## 2021-08-07 MED ORDER — CHLORHEXIDINE GLUCONATE CLOTH 2 % EX PADS: 6.0000 | MEDICATED_PAD | Freq: Once | CUTANEOUS | Status: DC

## 2021-08-07 MED ORDER — HEPARIN SOD (PORK) LOCK FLUSH 100 UNIT/ML IV SOLN
INTRAVENOUS | Status: AC
  Filled 2021-08-07: qty 5

## 2021-08-07 MED ORDER — FENTANYL CITRATE PF 50 MCG/ML IJ SOSY: 25.0000 ug | PREFILLED_SYRINGE | INTRAMUSCULAR | Status: DC | PRN

## 2021-08-07 MED ORDER — AMISULPRIDE (ANTIEMETIC) 5 MG/2ML IV SOLN: 10.0000 mg | Freq: Once | INTRAVENOUS | Status: DC | PRN

## 2021-08-07 MED ORDER — OXYCODONE HCL 5 MG PO TABS
5.0000 mg | ORAL_TABLET | Freq: Once | ORAL | Status: AC | PRN
  Administered 2021-08-07: 5 mg via ORAL

## 2021-08-07 MED ORDER — MIDAZOLAM HCL 2 MG/2ML IJ SOLN
INTRAMUSCULAR | Status: AC
  Filled 2021-08-07: qty 2

## 2021-08-07 MED ORDER — CHLORHEXIDINE GLUCONATE 0.12 % MT SOLN
15.0000 mL | Freq: Once | OROMUCOSAL | Status: AC
  Administered 2021-08-07: 15 mL via OROMUCOSAL

## 2021-08-07 MED ORDER — ONDANSETRON HCL 4 MG/2ML IJ SOLN
INTRAMUSCULAR | Status: DC | PRN
  Administered 2021-08-07: 4 mg via INTRAVENOUS

## 2021-08-07 MED ORDER — OXYCODONE HCL 5 MG/5ML PO SOLN: 5.0000 mg | Freq: Once | ORAL | Status: AC | PRN

## 2021-08-07 MED ORDER — CEFAZOLIN SODIUM-DEXTROSE 2-4 GM/100ML-% IV SOLN
2.0000 g | INTRAVENOUS | Status: AC
  Administered 2021-08-07: 2 g via INTRAVENOUS
  Filled 2021-08-07: qty 100

## 2021-08-07 MED ORDER — ORAL CARE MOUTH RINSE: 15.0000 mL | Freq: Once | OROMUCOSAL | Status: AC

## 2021-08-07 MED ORDER — FENTANYL CITRATE (PF) 100 MCG/2ML IJ SOLN
INTRAMUSCULAR | Status: DC | PRN
Start: 2021-08-07 — End: 2021-08-07
  Administered 2021-08-07: 25 ug via INTRAVENOUS

## 2021-08-07 MED ORDER — PHENYLEPHRINE HCL-NACL 20-0.9 MG/250ML-% IV SOLN
INTRAVENOUS | Status: DC | PRN
  Administered 2021-08-07: 40 ug/min via INTRAVENOUS

## 2021-08-07 MED ORDER — ACETAMINOPHEN 500 MG PO TABS: 1000.0000 mg | ORAL_TABLET | Freq: Once | ORAL | Status: DC

## 2021-08-07 MED ORDER — DEXAMETHASONE SODIUM PHOSPHATE 10 MG/ML IJ SOLN
INTRAMUSCULAR | Status: AC
  Filled 2021-08-07: qty 1

## 2021-08-07 MED ORDER — BUPIVACAINE-EPINEPHRINE 0.25% -1:200000 IJ SOLN
INTRAMUSCULAR | Status: DC | PRN
  Administered 2021-08-07: 20 mL

## 2021-08-07 MED ORDER — LIDOCAINE HCL (PF) 1 % IJ SOLN
INTRAMUSCULAR | Status: AC
  Filled 2021-08-07: qty 30

## 2021-08-07 MED ORDER — PROPOFOL 10 MG/ML IV BOLUS
INTRAVENOUS | Status: AC
  Filled 2021-08-07: qty 20

## 2021-08-07 MED ORDER — LACTATED RINGERS IV SOLN: INTRAVENOUS | Status: DC

## 2021-08-07 MED ORDER — DEXMEDETOMIDINE HCL IN NACL 80 MCG/20ML IV SOLN
INTRAVENOUS | Status: AC
  Filled 2021-08-07: qty 20

## 2021-08-07 MED ORDER — PROPOFOL 10 MG/ML IV BOLUS
INTRAVENOUS | Status: DC | PRN
  Administered 2021-08-07: 150 mg via INTRAVENOUS
  Administered 2021-08-07: 20 mg via INTRAVENOUS

## 2021-08-07 MED ORDER — DEXMEDETOMIDINE (PRECEDEX) IN NS 20 MCG/5ML (4 MCG/ML) IV SYRINGE
PREFILLED_SYRINGE | INTRAVENOUS | Status: DC | PRN
  Administered 2021-08-07: 8 ug via INTRAVENOUS
  Administered 2021-08-07: 12 ug via INTRAVENOUS

## 2021-08-07 MED ORDER — MIDAZOLAM HCL 5 MG/5ML IJ SOLN
INTRAMUSCULAR | Status: DC | PRN
  Administered 2021-08-07: 2 mg via INTRAVENOUS

## 2021-08-07 MED ORDER — HEPARIN 6000 UNIT IRRIGATION SOLUTION
Freq: Once | Status: AC
  Administered 2021-08-07: 1
  Filled 2021-08-07: qty 6000

## 2021-08-07 MED ORDER — LIDOCAINE HCL (PF) 2 % IJ SOLN
INTRAMUSCULAR | Status: AC
Start: 2021-08-07 — End: ?
  Filled 2021-08-07: qty 5

## 2021-08-07 MED ORDER — LIDOCAINE 2% (20 MG/ML) 5 ML SYRINGE
INTRAMUSCULAR | Status: DC | PRN
  Administered 2021-08-07: 60 mg via INTRAVENOUS

## 2021-08-07 MED ORDER — HEPARIN SOD (PORK) LOCK FLUSH 100 UNIT/ML IV SOLN
INTRAVENOUS | Status: DC | PRN
  Administered 2021-08-07: 500 [IU] via INTRAVENOUS

## 2021-08-07 MED ORDER — PROPOFOL 500 MG/50ML IV EMUL
INTRAVENOUS | Status: DC | PRN
  Administered 2021-08-07: 150 ug/kg/min via INTRAVENOUS

## 2021-08-07 MED ORDER — ONDANSETRON HCL 4 MG/2ML IJ SOLN: 4.0000 mg | Freq: Once | INTRAMUSCULAR | Status: DC | PRN

## 2021-08-07 MED ORDER — LIDOCAINE HCL 1 % IJ SOLN
INTRAMUSCULAR | Status: DC | PRN
  Administered 2021-08-07: 20 mL

## 2021-08-07 MED ORDER — FENTANYL CITRATE (PF) 100 MCG/2ML IJ SOLN
INTRAMUSCULAR | Status: AC
  Filled 2021-08-07: qty 2

## 2021-08-07 MED ORDER — ONDANSETRON HCL 4 MG/2ML IJ SOLN
INTRAMUSCULAR | Status: AC
  Filled 2021-08-07: qty 2

## 2021-08-07 SURGICAL SUPPLY — 34 items
BAG COUNTER SPONGE SURGICOUNT (BAG) IMPLANT
BAG DECANTER FOR FLEXI CONT (MISCELLANEOUS) ×2 IMPLANT
BLADE HEX COATED 2.75 (ELECTRODE) ×2 IMPLANT
BLADE SURG 15 STRL LF DISP TIS (BLADE) ×1 IMPLANT
BLADE SURG 15 STRL SS (BLADE) ×2
BLADE SURG SZ11 CARB STEEL (BLADE) ×2 IMPLANT
CHLORAPREP W/TINT 26 (MISCELLANEOUS) ×2 IMPLANT
COVER SURGICAL LIGHT HANDLE (MISCELLANEOUS) ×2 IMPLANT
DERMABOND ADVANCED (GAUZE/BANDAGES/DRESSINGS) ×1
DERMABOND ADVANCED .7 DNX12 (GAUZE/BANDAGES/DRESSINGS) ×1 IMPLANT
DRAPE C-ARM 42X120 X-RAY (DRAPES) ×2 IMPLANT
DRAPE LAPAROTOMY TRNSV 102X78 (DRAPES) ×2 IMPLANT
DRAPE UTILITY XL STRL (DRAPES) ×2 IMPLANT
ELECT REM PT RETURN 15FT ADLT (MISCELLANEOUS) ×2 IMPLANT
GAUZE 4X4 16PLY ~~LOC~~+RFID DBL (SPONGE) ×2 IMPLANT
GLOVE BIO SURGEON STRL SZ 6 (GLOVE) ×2 IMPLANT
GLOVE INDICATOR 6.5 STRL GRN (GLOVE) ×2 IMPLANT
GOWN SPEC L3 XXLG W/TWL (GOWN DISPOSABLE) ×2 IMPLANT
GOWN STRL REUS W/TWL 2XL LVL3 (GOWN DISPOSABLE) ×2 IMPLANT
KIT BASIN OR (CUSTOM PROCEDURE TRAY) ×2 IMPLANT
KIT PORT POWER 8FR ISP CVUE (Port) ×1 IMPLANT
KIT TURNOVER KIT A (KITS) IMPLANT
NEEDLE HYPO 22GX1.5 SAFETY (NEEDLE) ×2 IMPLANT
PACK BASIC VI WITH GOWN DISP (CUSTOM PROCEDURE TRAY) ×2 IMPLANT
PENCIL SMOKE EVACUATOR (MISCELLANEOUS) IMPLANT
SPIKE FLUID TRANSFER (MISCELLANEOUS) ×2 IMPLANT
SUT MNCRL AB 4-0 PS2 18 (SUTURE) ×2 IMPLANT
SUT PROLENE 2 0 SH DA (SUTURE) ×4 IMPLANT
SUT VIC AB 3-0 SH 27 (SUTURE) ×2
SUT VIC AB 3-0 SH 27X BRD (SUTURE) ×1 IMPLANT
SYR 10ML LL (SYRINGE) ×2 IMPLANT
SYR CONTROL 10ML LL (SYRINGE) ×2 IMPLANT
TOWEL OR 17X26 10 PK STRL BLUE (TOWEL DISPOSABLE) ×2 IMPLANT
TOWEL OR NON WOVEN STRL DISP B (DISPOSABLE) ×2 IMPLANT

## 2021-08-07 NOTE — Anesthesia Preprocedure Evaluation (Signed)
Anesthesia Evaluation  Patient identified by MRN, date of birth, ID band Patient awake    Reviewed: Allergy & Precautions, NPO status , Patient's Chart, lab work & pertinent test results  History of Anesthesia Complications Negative for: history of anesthetic complications  Airway Mallampati: III  TM Distance: >3 FB Neck ROM: Full    Dental  (+) Dental Advisory Given, Teeth Intact   Pulmonary neg pulmonary ROS,    Pulmonary exam normal        Cardiovascular negative cardio ROS Normal cardiovascular exam     Neuro/Psych Chiari I malformation negative neurological ROS     GI/Hepatic negative GI ROS, Neg liver ROS,   Endo/Other  Hypothyroidism   Renal/GU negative Renal ROS  negative genitourinary   Musculoskeletal negative musculoskeletal ROS (+)   Abdominal   Peds  Hematology negative hematology ROS (+)   Anesthesia Other Findings Right breast cancer  Reproductive/Obstetrics                             Anesthesia Physical Anesthesia Plan  ASA: 2  Anesthesia Plan: General   Post-op Pain Management: Tylenol PO (pre-op)* and Toradol IV (intra-op)*   Induction: Intravenous  PONV Risk Score and Plan: 3 and Ondansetron, Dexamethasone, Midazolam and Treatment may vary due to age or medical condition  Airway Management Planned: LMA  Additional Equipment: None  Intra-op Plan:   Post-operative Plan: Extubation in OR  Informed Consent: I have reviewed the patients History and Physical, chart, labs and discussed the procedure including the risks, benefits and alternatives for the proposed anesthesia with the patient or authorized representative who has indicated his/her understanding and acceptance.     Dental advisory given  Plan Discussed with:   Anesthesia Plan Comments:         Anesthesia Quick Evaluation

## 2021-08-07 NOTE — Discharge Instructions (Signed)
[facility] Office Phone Number [phone]   POST OP INSTRUCTIONS  Always review your discharge instruction sheet given to you by the facility where your surgery was performed.  IF YOU HAVE DISABILITY OR FAMILY LEAVE FORMS, YOU MUST BRING THEM TO THE OFFICE FOR PROCESSING.  DO NOT GIVE THEM TO YOUR DOCTOR.  Take 2 tylenol (acetominophen) three times a day for 3 days.  If you still have pain, add ibuprofen with food in between if able to take this (if you have kidney issues or stomach issues, do not take ibuprofen).  If both of those are not enough, add the narcotic pain pill.  If you find you are needing a lot of this overnight after surgery, call the next morning for a refill.   Take your usually prescribed medications unless otherwise directed If you need a refill on your pain medication, please contact your pharmacy.  They will contact our office to request authorization.  Prescriptions will not be filled after 5pm or on week-ends. You should eat very light the first 24 hours after surgery, such as soup, crackers, pudding, etc.  Resume your normal diet the day after surgery It is common to experience some constipation if taking pain medication after surgery.  Increasing fluid intake and taking a stool softener will usually help or prevent this problem from occurring.  A mild laxative (Milk of Magnesia or Miralax) should be taken according to package directions if there are no bowel movements after 48 hours. You may shower in 48 hours.  The surgical glue will flake off in 2-3 weeks.   ACTIVITIES:  No strenuous activity or heavy lifting for 1 week.   You may drive when you no longer are taking prescription pain medication, you can comfortably wear a seatbelt, and you can safely maneuver your car and apply brakes. RETURN TO WORK:  __________to be determined. _______________ You should see your doctor in the office for a follow-up appointment approximately three-four weeks after  your surgery.    WHEN TO CALL YOUR DOCTOR: Fever over 101.0 Nausea and/or vomiting. Extreme swelling or bruising. Continued bleeding from incision. Increased pain, redness, or drainage from the incision.  The clinic staff is available to answer your questions during regular business hours.  Please don't hesitate to call and ask to speak to one of the nurses for clinical concerns.  If you have a medical emergency, go to the nearest emergency room or call 911.  A surgeon from [facility] is always on call at the hospital.  For further questions, please visit [email or web address]   

## 2021-08-07 NOTE — Interval H&P Note (Signed)
History and Physical Interval Note:  08/07/2021 8:42 AM  Rachel [name]  has presented today for surgery, with the diagnosis of RIGHT BREAST CANCER.  The various methods of treatment have been discussed with the patient and family. After consideration of risks, benefits and other options for treatment, the patient has consented to  Procedure(s): PORT PLACEMENT WITH ULTRASOUND GUIDANCE (N/A) as a surgical intervention.  The patient's history has been reviewed, patient examined, no change in status, stable for surgery.  I have reviewed the patient's chart and labs.  Questions were answered to the patient's satisfaction.     Stark Klein

## 2021-08-07 NOTE — Anesthesia Postprocedure Evaluation (Signed)
Anesthesia Post Note  Patient: Robbye Dede [facility]  Procedure(s) Performed: PORT PLACEMENT WITH ULTRASOUND GUIDANCE     Patient location during evaluation: PACU Anesthesia Type: General Level of consciousness: awake and alert Pain management: pain level controlled Vital Signs Assessment: post-procedure vital signs reviewed and stable Respiratory status: spontaneous breathing, nonlabored ventilation and respiratory function stable Cardiovascular status: blood pressure returned to baseline and stable Postop Assessment: no apparent nausea or vomiting Anesthetic complications: no   No notable events documented.  Last Vitals:  Vitals:   08/07/21 1130 08/07/21 1146  BP: (!) 91/56 (!) 115/50  Pulse: 61 62  Resp: 18 16  Temp:    SpO2: 98% 99%    Last Pain:  Vitals:   08/07/21 1146  TempSrc:   PainSc: 0-No pain                 Lidia Collum

## 2021-08-07 NOTE — Op Note (Signed)
PREOPERATIVE DIAGNOSIS:  right breast cancer     POSTOPERATIVE DIAGNOSIS:  Same     PROCEDURE: Left subclavian port placement, Bard ClearVue Power Port, MRI safe, 8-French.      SURGEON:  Stark Klein, MD      ANESTHESIA:  General   FINDINGS:  Good venous return, easy flush, and tip of the catheter and   SVC 21.5 cm.      SPECIMEN:  None.      ESTIMATED BLOOD LOSS:  Minimal.      COMPLICATIONS:  None known.      PROCEDURE:  Pt was identified in the holding area and taken to   the operating room, where patient was placed supine on the operating room   table.  General anesthesia was induced.  Patient's arms were tucked and the upper   chest and neck were prepped and draped in sterile fashion.  Time-out was   performed according to the surgical safety check list.  When all was   correct, we continued.   Local anesthetic was administered over this   area at the angle of the clavicle.  The vein was accessed with 1 pass(es) of the needle. There was good venous return and the wire passed easily with no ectopy.   Fluoroscopy was used to confirm that the wire was in the vena cava.      The patient was placed back level and the area for the pocket was anethetized   with local anesthetic.  A 3-cm transverse incision was made with a #15   blade.  Cautery was used to divide the subcutaneous tissues down to the   pectoralis muscle.  An [employer] retractor was used to elevate the skin   while a pocket was created on top of the pectoralis fascia.  The port   was placed into the pocket to confirm that it was of adequate size.  The   catheter was preattached to the port.  The port was then secured to the   pectoralis fascia with four 2-0 Prolene sutures.  These were clamped and   not tied down yet.    The catheter was placed along the wire to determine what length it should be to be in the SVC.  The catheter was cut at 21.5 cm.  The tunneler sheath and dilator were passed over the wire and the  dilator and wire were removed.  The catheter was advanced through the tunneler sheath and the tunneler sheath was pulled away.  Care was taken to keep the catheter in the tunneler sheath as this occurred. This was advanced and the tunneler sheath was removed.  There was good venous return and easy flush of the catheter.  The Prolene sutures were tied   down to the pectoral fascia.  The skin was reapproximated using 3-0   Vicryl interrupted deep dermal sutures.    Fluoroscopy was used to re-confirm good position of the catheter.  The skin   was then closed using 4-0 Monocryl in a subcuticular fashion.  The port was flushed with concentrated heparin flush as well.  The wounds were then cleaned, dried, and dressed with Dermabond.  The patient was awakened from anesthesia and taken to the PACU in stable condition.  Needle, sponge, and instrument counts were correct.               Stark Klein, MD

## 2021-08-07 NOTE — Transfer of Care (Signed)
Immediate Anesthesia Transfer of Care Note  Patient: Rachel [name] [facility]  Procedure(s) Performed: PORT PLACEMENT WITH ULTRASOUND GUIDANCE  Patient Location: PACU  Anesthesia Type:General  Level of Consciousness: drowsy  Airway & Oxygen Therapy: Patient Spontanous Breathing  Post-op Assessment: Report given to RN and Post -op Vital signs reviewed and stable  Post vital signs: Reviewed and stable  Last Vitals:  Vitals Value Taken Time  BP 102/58 08/07/21 1015  Temp 36.4 C 08/07/21 1015  Pulse 73 08/07/21 1019  Resp 21 08/07/21 1019  SpO2 91 % 08/07/21 1019  Vitals shown include unvalidated device data.  Last Pain:  Vitals:   08/07/21 1015  TempSrc:   PainSc: 0-No pain         Complications: No notable events documented.

## 2021-08-07 NOTE — H&P (Signed)
  MRN: [id number] DOB: Aug 19, 1965 DATE OF ENCOUNTER: 08/02/2021 Initial History:   Patient presented with recurrent breast cancer March 2023. She had stage I right breast cancer in 2002. She had a lumpectomy and sentinel lymph node biopsy with Dr. Zettie Pho followed by chemotherapy and radiation. She had adjuvant tamoxifen as well. She has been keeping up with her mammograms religiously. Unfortunately, on her March mammogram she was found to have right breast asymmetry and calcifications.  She had a 7 mm retroareolar mass as well as a persistent area of asymmetry in the lower inner quadrant on the right. There were some upper central calcifications. Ultrasound confirmed the retroareolar mass as being 6 mm and the lower inner quadrant region being 5 mm. The calcifications were not seen on the ultrasound. The axilla was negative on ultrasound. She had biopsies of all 3 areas. The calcifications were benign and concordant. 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 The lower inner quadrant mass was also a grade 2 invasive ductal carcinoma that was ER/PR positive, HER2 negative, Ki-67 of 15%.  She has no family history of malignancy other than an aunt with HPV associated cervical cancer and a great uncle with lung cancer who is a lifetime smoker. 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 She has adult children who do not live with her and lives with her husband who is present at the appointment.  Interval History:   Patient underwent right mastectomy and sentinel lymph node biopsy 06/25/2021. Margins and lymph nodes were negative. She had 2 invasive cancers the largest of which was 1 cm. 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 Unfortunately, she had a high Oncotype. She has cut back to only occasional narcotic. She is finding benefit from the Robaxin. She has stopped taking the gabapentin  as she did not feel that it helped significantly. She is continuing on Tylenol and ibuprofen. Her drain was removed last week and she is working on her stretching exercises.   Pathology 06/25/2021 A. 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 LYMPH NODE, RIGHT AXILLA #1, SENTINEL, EXCISION:  One benign lymph node, negative for carcinoma (0/1)   C. LYMPH NODE, RIGHT AXILLA #2, SENTINEL, EXCISION:  One benign lymph node, negative for carcinoma (0/1)   D. LYMPH NODE, RIGHT AXILLA #3, SENTINEL, EXCISION:  One benign lymph node, negative for carcinoma (0/1)   Physical Examination:   Right chest wall is nice and flat. There are 2 solitary small red bumps directly superior to the incision, but no cellulitis, induration, fluctuance, or drainage. No evidence of infection. Drain site has closed over.  Assessment and Plan:   Rachel [name] is a 56 y.o. female who underwent right mastectomy and SLN bx on 06/25/2021.  Diagnoses and all orders for this visit:  Cancer of overlapping sites of right breast (CMS-HCC) Port planned.  Reviewed surgery.

## 2021-08-07 NOTE — Anesthesia Procedure Notes (Signed)
Procedure Name: LMA Insertion Date/Time: 08/07/2021 9:12 AM  Performed by: Milford Cage, CRNAPre-anesthesia Checklist: Patient identified, Emergency Drugs available, Suction available and Patient being monitored Patient Re-evaluated:Patient Re-evaluated prior to induction Oxygen Delivery Method: Circle system utilized Preoxygenation: Pre-oxygenation with 100% oxygen Induction Type: IV induction Ventilation: Mask ventilation without difficulty LMA: LMA inserted LMA Size: 3.0 Number of attempts: 1 Tube secured with: Tape Dental Injury: Teeth and Oropharynx as per pre-operative assessment

## 2021-08-08 ENCOUNTER — Ambulatory Visit: Payer: BC Managed Care – PPO | Admitting: Rehabilitation

## 2021-08-08 ENCOUNTER — Encounter (HOSPITAL_COMMUNITY): Payer: Self-pay | Admitting: General Surgery

## 2021-08-09 ENCOUNTER — Encounter (HOSPITAL_COMMUNITY): Payer: Self-pay | Admitting: General Surgery

## 2021-08-12 ENCOUNTER — Ambulatory Visit: Payer: BC Managed Care – PPO | Attending: General Surgery

## 2021-08-12 DIAGNOSIS — Z171 Estrogen receptor negative status [ER-]: Secondary | ICD-10-CM | POA: Diagnosis present

## 2021-08-12 DIAGNOSIS — R293 Abnormal posture: Secondary | ICD-10-CM | POA: Insufficient documentation

## 2021-08-12 DIAGNOSIS — M25611 Stiffness of right shoulder, not elsewhere classified: Secondary | ICD-10-CM | POA: Diagnosis present

## 2021-08-12 DIAGNOSIS — Z483 Aftercare following surgery for neoplasm: Secondary | ICD-10-CM | POA: Insufficient documentation

## 2021-08-12 DIAGNOSIS — C50611 Malignant neoplasm of axillary tail of right female breast: Secondary | ICD-10-CM | POA: Insufficient documentation

## 2021-08-12 NOTE — Therapy (Signed)
OUTPATIENT PHYSICAL THERAPY BREAST CANCER TREATMENT NOTE   Patient Name: Rachel [name] MRN: [id number] DOB:[birth year], 56 y.o., female Today's Date: 08/12/2021   PT End of Session - 08/12/21 1306     Visit Number 7    Number of Visits 10    Date for PT Re-Evaluation 09/09/21    PT Start Time 1301    PT Stop Time 1359    PT Time Calculation (min) 58 min    Activity Tolerance Patient tolerated treatment well    Behavior During Therapy [facility] for tasks assessed/performed               Past Medical History:  Diagnosis Date   ASCUS of cervix with negative high risk HPV 03/2018   Cancer ([place]) 2001   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  Prior T score -2.5.  DEXA stable from prior study.   Personal history of chemotherapy 2001   Personal history of radiation therapy 2001   Pneumonia    Had 12-2017   PONV (postoperative nausea and vomiting)    Past Surgical History:  Procedure Laterality Date   BREAST EXCISIONAL BIOPSY Left 2021   BREAST LUMPECTOMY Right 2001   CESAREAN SECTION  1991   COLONOSCOPY     MASTECTOMY W/ SENTINEL NODE BIOPSY Right 06/25/2021   Procedure: RIGHT MASTECTOMY WITH SENTINEL LYMPH NODE BIOPSY;  Surgeon: Stark Klein, MD;  Location: [place];  Service: General;  Laterality: Right;   NASAL SINUS SURGERY Right 02/20/2020   Procedure: ENDOSCOPIC SINUS SURGERY WITH RIGHT ETHMOIDECTOMY AND RIGHT MAXILLARY OSTIA ENLARGEMENT WITH FUSION;  Surgeon: Rozetta Nunnery, MD;  Location: [place];  Service: ENT;  Laterality: Right;   PELVIC LAPAROSCOPY     endometriosis   PORTACATH PLACEMENT N/A 08/07/2021   Procedure: PORT PLACEMENT WITH ULTRASOUND  GUIDANCE;  Surgeon: Stark Klein, MD;  Location[facility];  Service: General;  Laterality: N/A;   SINUS ENDO WITH FUSION Right 02/20/2020   Procedure: SINUS ENDO WITH FUSION;  Surgeon: Rozetta Nunnery, MD;  Location: [place];  Service: ENT;  Laterality: Right;   TONSILLECTOMY  1980   UPPER GI ENDOSCOPY     [place]   Patient Active Problem List   Diagnosis Date Noted   Cancer of overlapping sites of right female breast ([place]) 06/25/2021   Lateral epicondylitis, left elbow 03/04/2021   Left wrist pain 06/24/2019   Pain in joint of left shoulder 06/24/2019   Pain in joint of left elbow 06/24/2019   Family history of uterine cancer    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 My Lt shoulder is sore/tight from having the port placed last week. First chemo is this Friday. My chest has aot of discomfort today I think from the port but I can tell my Rt pect is getting better.   PERTINENT HISTORY:  Patient was diagnosed on 06/03/21 with right breast  cancer recurrence. Initial lumpectomy, SLNB x 3, radiation, chemotherapy, and antiestrogens. Rt mastectomy on 06/25/21 with Dr. Barry Dienes with another SLNB 0/3 positive lymph nodes.  Other hx: chiari malformation   PATIENT GOALS:  Reassess how my recovery is going related to arm function, pain, and swelling.  PAIN:  Are you having pain? No, just discomfort across the chest  PRECAUTIONS: Recent Surgery, right UE Lymphedema risk, 6 total lymph nodes removed x 2 breast cancers OBJECTIVE:  PATIENT SURVEYS:  QUICK DASH: 80%  OBSERVATIONS:  Very well healed very flat mastectomy incision. No edema or redness  Pectoralis axillary border tight with spasm during Abduction  POSTURE:  Slightly guarded  LYMPHEDEMA ASSESSMENT:   UPPER EXTREMITY AROM/PROM:   A/PROM RIGHT  06/17/2021   07/18/21 07/30/21 08/06/21  Shoulder extension 53     Shoulder flexion 170 145 - feeling tight 155 158 - pull in axilla  Shoulder abduction 170 114 130 - pectoralis muscle into chest 115 - pull in the pectoralis - some cording now noted   Shoulder internal rotation       Shoulder external rotation 90                             (Blank rows = not tested)   CERVICAL AROM: All within normal limits:    LYMPHEDEMA ASSESSMENTS:    [facility] RIGHT  06/17/2021 6/8/23Rt 6/8/23Lt  10 cm proximal to olecranon process 36.5 36.5   Olecranon process 23.5 23.5   10 cm proximal to ulnar styloid process 20.6 20.6   Just proximal to ulnar styloid process 15.3 15.3   Across hand at thumb web space 18 18   At base of 2nd digit 5.8 5.8   (Blank rows = not tested)  TODAY'S TREATMENT: 08/12/21: Therapeutic Exs:  Pulleys: x2 mins into flex and scaption with pt returning therapist demo and  VCs during to relax  Ball roll up wall into flexion x10 and Rt abd x7 returning therapist demo and cuing not to push into pain  Modified downward dog on wall x7, 5 sec holds returning therapist demo  Manual Therapy: STM in supine to Rt chest wall and  pectoralis insertion Small cording may be palpable and noted to be pulling in abduction but not visible.  MFR to Rt axilla during P/ROM not tolerated as well today due to pectoralis spasm P/ROM in supine to Rt shoulder to per tolerance into flexion, abd and D2 along with er   08/06/21 Therapeutic Exs:  Pulleys: x2 mins into flex and scaption with pt returning therapist demo and VCs during to relax  Manual Therapy: STM in supine to Rt chest wall and pectoralis insertion able to prop the arm on 2 pillows in radiation type position today during STM. Small cording may be palpable and noted to be pulling in abduction but not visible. May explain new tenderness and puffiness in axilla.  MFR to Rt axilla during P/ROM not tolerated as well today due to pectoralis spasm P/ROM in supine to Rt shoulder to per tolerance into flexion, abd and D2 along with er  08/01/21 Pt has some new redness along the incision which normally would look unconcerning but as she has not had this redness at all x 5 weeks and pt reports it was larger than this earlier I sent a note to Dr. Barry Dienes for possible follow up.  Therapeutic Exs:  Pulleys: x2 mins into flex and scaption with pt returning therapist demo and VCs during to relax  Ball roll up wall into flexion x10 with VCs to relax shoulders and only push into tolerate stretch  Supine dowel flexion x 10   Manual Therapy:  STM in supine to Rt chest wall and pectoralis insertion held due to new redness P/ROM in supine to Rt shoulder to per tolerance into flexion, abd and D2 along with er Sent picture to Dr. Barry Dienes regarding new redness at the incision - photo from today   07/30/21 Therapeutic Exs:  Pulleys: x2 mins into flex and scaption with pt returning therapist demo and VCs during to relax  Ball roll up wall into flexion x10 with VCs to relax shoulders and only push into tolerate stretch  Supine arms in T LTR x 5 bil   Manual Therapy:  STM in supine to Rt chest wall  and pectoralis insertion MFR to Rt axilla during P/ROM not tolerated as well today due to pectoralis spasm P/ROM in supine to Rt shoulder to per tolerance into flexion, abd and D2 along with er  PATIENT EDUCATION:  Education details: updated HEP, POC Person educated: Patient Education method: Explanation, Demonstration, Tactile cues, and Handouts Education comprehension: verbalized understanding, returned demonstration, verbal cues required, tactile cues required, and needs further education  HOME EXERCISE PROGRAM:  Reviewed previously given post op HEP with changes as needed:  Access Code: [email or web address] URL: [email or web address] Date: 07/18/2021 Prepared by: Shan Levans  Exercises - Supine Shoulder Flexion AAROM with Hands Clasped - 1-2 x daily - 7 x weekly - 1 sets - 10 reps - 5 second hold - Supine Chest Stretch with Elbows Bent - 1 x daily - 7 x weekly - 1 sets - 3 reps - 30-60seconds hold - Standing Shoulder Abduction AAROM with Dowel - 1-2 x daily - 7 x weekly - 1 sets - 10 reps - 5 second hold - Supine Thoracic Mobilization Towel Roll Vertical  with Arm Stretch - 2 x daily - 7 x weekly - 1 sets - 1 reps - 20-30 seconds hold  ASSESSMENT:  CLINICAL IMPRESSION: Pt conts much better today.  She reports noting her Rt pectoralis has improved since start of care thought still with tightness present at end ROMs. She was having some increased discomfort from port placement last week but reports feeling good stretches with AA/ROM. Decide next if pt ready for D/C or renew?  Pt will benefit from skilled therapeutic intervention to improve on the following deficits: Decreased knowledge of precautions, impaired UE functional use, pain, decreased ROM, postural dysfunction.   PT treatment/interventions: ADL/Self care home management, Therapeutic exercises, Patient/Family education, and Manual therapy   GOALS: Goals reviewed with patient? Yes  LONG TERM GOALS:  (STG=LTG)  GOALS  Name Target Date  Goal status  1 Pt will demonstrate she has regained full shoulder ROM and function post operatively compared to baselines.  Baseline: 08/15/2021 IN PROGRESS  2 Pt will be educated on lymphedema risk reduction- ABC class - and SOZO surveillance 08/15/21 INITIAL  3 Pt will feel ind with continued HEP 08/15/21 INITIAL          PLAN: PT FREQUENCY/DURATION: 1-2x per week for 4 weeks  PLAN FOR NEXT SESSION: Decide renew or D/C next, maybe decr freq to 1x/wk? Begin in person education for ABC class as pt will not be able to make the next one; cont PROM and STM Rt UE, AAROM activities   [facility]  [address]  [place] [address]  [phone]  After Breast Cancer Class It is recommended you attend the ABC class to be educated on lymphedema risk reduction. This class is free of charge and lasts for 1 hour. It is a 1-time class. You will need to download the Webex app either on your phone or computer. We will send you a link the night before or the morning of the class. You should be able to click on that link to join the class. This is not a confidential class. You don't have to turn your camera on, but other participants may be able to see your email address.  Scar massage You can begin gentle scar massage to you incision sites. Gently place one hand on the incision and move the skin (without sliding on the skin) in various directions. Do this for a few minutes and then you can gently massage either coconut oil or vitamin E cream into the scars.  Compression garment You should continue wearing your compression bra until you feel like you no longer have swelling.  Home exercise Program Continue doing the exercises you were given until you feel like you can do them without feeling any tightness at the end.   Walking Program Studies show that 30 minutes of walking per day (fast enough to elevate your heart rate) can significantly reduce the  risk of a cancer recurrence. If you can't walk due to other medical reasons, we encourage you to find another activity you could do (like a stationary bike or water exercise).  Posture After breast cancer surgery, people frequently sit with rounded shoulders posture because it puts their incisions on slack and feels better. If you sit like this and scar tissue forms in that position, you can become very tight and have pain sitting or standing with good posture. Try to be aware of your posture and sit and stand up tall to heal properly.  Follow up PT:  It is recommended you return every 3 months for the first 3 years following surgery to be assessed on the SOZO machine for an L-Dex score. This helps prevent clinically significant lymphedema in 95% of patients. These follow up screens are 10 minute appointments that you are not billed for.  Otelia Limes, PTA 08/12/2021, 2:00 PM

## 2021-08-15 ENCOUNTER — Ambulatory Visit: Payer: BC Managed Care – PPO | Admitting: Physical Therapy

## 2021-08-15 ENCOUNTER — Encounter: Payer: Self-pay | Admitting: Physical Therapy

## 2021-08-15 ENCOUNTER — Inpatient Hospital Stay: Payer: BC Managed Care – PPO | Attending: Hematology & Oncology

## 2021-08-15 ENCOUNTER — Encounter: Payer: Self-pay | Admitting: *Deleted

## 2021-08-15 DIAGNOSIS — Z483 Aftercare following surgery for neoplasm: Secondary | ICD-10-CM

## 2021-08-15 DIAGNOSIS — C50611 Malignant neoplasm of axillary tail of right female breast: Secondary | ICD-10-CM

## 2021-08-15 DIAGNOSIS — Z9011 Acquired absence of right breast and nipple: Secondary | ICD-10-CM | POA: Insufficient documentation

## 2021-08-15 DIAGNOSIS — C50911 Malignant neoplasm of unspecified site of right female breast: Secondary | ICD-10-CM | POA: Insufficient documentation

## 2021-08-15 DIAGNOSIS — Z5111 Encounter for antineoplastic chemotherapy: Secondary | ICD-10-CM | POA: Insufficient documentation

## 2021-08-15 DIAGNOSIS — J329 Chronic sinusitis, unspecified: Secondary | ICD-10-CM | POA: Insufficient documentation

## 2021-08-15 DIAGNOSIS — Z170 Estrogen receptor positive status [ER+]: Secondary | ICD-10-CM | POA: Insufficient documentation

## 2021-08-15 DIAGNOSIS — Z5189 Encounter for other specified aftercare: Secondary | ICD-10-CM | POA: Insufficient documentation

## 2021-08-15 DIAGNOSIS — R293 Abnormal posture: Secondary | ICD-10-CM

## 2021-08-15 DIAGNOSIS — M25611 Stiffness of right shoulder, not elsewhere classified: Secondary | ICD-10-CM

## 2021-08-15 NOTE — Progress Notes (Signed)
Patient in chemotherapy education class with her husband.  Discussed side effects of      Taxotere and Cytoxan which include but are not limited to myelosuppression, decreased appetite, fatigue, fever, allergic or infusional reaction, mucositis, cardiac toxicity, cough, SOB, altered taste, nausea and vomiting, diarrhea, constipation, elevated LFTs myalgia and arthralgias, hair loss or thinning, rash, skin dryness, nail changes, peripheral neuropathy,  delayed wound healing, mental changes (Chemo brain), increased risk of infections, weight loss.  Reviewed infusion room and office policy and procedure and phone numbers 24 hours x 7 days a week.  Reviewed ambulatory pump specifics and how to manage safe handling at home.  Reviewed when to call the office with any concerns or problems.  Scientist, clinical (histocompatibility and immunogenetics) given.  Discussed portacath insertion and EMLA cream administration.  Antiemetic protocol and chemotherapy schedule reviewed. Patient verbalized understanding of chemotherapy indications and possible side effects.  Teachback done

## 2021-08-15 NOTE — Therapy (Signed)
OUTPATIENT PHYSICAL THERAPY BREAST CANCER TREATMENT NOTE   Patient Name: Rachel [name] MRN: [id number] DOB:1965/09/26, 56 y.o., female Today's Date: 08/15/2021   PT End of Session - 08/15/21 1457     Visit Number 8    Number of Visits 10    Date for PT Re-Evaluation 09/09/21    PT Start Time 1404    PT Stop Time 1456    PT Time Calculation (min) 52 min    Activity Tolerance Patient tolerated treatment well    Behavior During Therapy [facility] for tasks assessed/performed               Past Medical History:  Diagnosis Date   ASCUS of cervix with negative high risk HPV 03/2018   Cancer ([place]) 2001   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  Prior T score -2.5.  DEXA stable from prior study.   Personal history of chemotherapy 2001   Personal history of radiation therapy 2001   Pneumonia    Had 12-2017   PONV (postoperative nausea and vomiting)    Past Surgical History:  Procedure Laterality Date   BREAST EXCISIONAL BIOPSY Left 2021   BREAST LUMPECTOMY Right 2001   CESAREAN SECTION  1991   COLONOSCOPY     MASTECTOMY W/ SENTINEL NODE BIOPSY Right 06/25/2021   Procedure: RIGHT MASTECTOMY WITH SENTINEL LYMPH NODE BIOPSY;  Surgeon: Stark Klein, MD;  Location: [place];  Service: General;  Laterality: Right;   NASAL SINUS SURGERY Right 02/20/2020   Procedure: ENDOSCOPIC SINUS SURGERY WITH RIGHT ETHMOIDECTOMY AND RIGHT MAXILLARY OSTIA ENLARGEMENT WITH FUSION;  Surgeon: Rozetta Nunnery, MD;  Location: [place];  Service: ENT;  Laterality: Right;   PELVIC LAPAROSCOPY     endometriosis   PORTACATH PLACEMENT N/A 08/07/2021   Procedure: PORT PLACEMENT WITH ULTRASOUND  GUIDANCE;  Surgeon: Stark Klein, MD;  [facility];  Service: General;  Laterality: N/A;   SINUS ENDO WITH FUSION Right 02/20/2020   Procedure: SINUS ENDO WITH FUSION;  Surgeon: Rozetta Nunnery, MD;  Location: [place];  Service: ENT;  Laterality: Right;   [place]   Patient Active Problem List   Diagnosis Date Noted   Cancer of overlapping sites of right female breast ([place]) 06/25/2021   Lateral epicondylitis, left elbow 03/04/2021   Left wrist pain 06/24/2019   Pain in joint of left shoulder 06/24/2019   Pain in joint of left elbow 06/24/2019   Family history of uterine cancer    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 Barry Dienes  REFERRING DIAG: Rt breast cancer  THERAPY DIAG:  Stiffness of right shoulder, not elsewhere classified  Aftercare following surgery for neoplasm  Abnormal posture  Malignant neoplasm of axillary tail of right  breast in female, estrogen receptor negative ([place])  Rationale for Evaluation and Treatment Rehabilitation  ONSET DATE: 06/03/21  SUBJECTIVE:                                                                                                                                                                                           SUBJECTIVE STATEMENT: I am still having soreness on both sides. The one side is from the cording and the other is from the port.   PERTINENT HISTORY:  Patient was diagnosed on 06/03/21 with right breast cancer recurrence. Initial lumpectomy, SLNB x 3, radiation, chemotherapy, and antiestrogens. Rt mastectomy on 06/25/21 with Dr. Barry Dienes with another SLNB  0/3 positive lymph nodes.  Other hx: chiari malformation   PATIENT GOALS:  Reassess how my recovery is going related to arm function, pain, and swelling.  PAIN:  Are you having pain? No, just tightness in the arm and across the chest  PRECAUTIONS: Recent Surgery, right UE Lymphedema risk, 6 total lymph nodes removed x 2 breast cancers OBJECTIVE:  PATIENT SURVEYS:  QUICK DASH: 80%  OBSERVATIONS:  Very well healed very flat mastectomy incision. No edema or redness  Pectoralis axillary border tight with spasm during Abduction  POSTURE:  Slightly guarded  LYMPHEDEMA ASSESSMENT:   UPPER EXTREMITY AROM/PROM:   A/PROM RIGHT  06/17/2021   07/18/21 07/30/21 08/06/21  Shoulder extension 53     Shoulder flexion 170 145 - feeling tight 155 158 - pull in axilla  Shoulder abduction 170 114 130 - pectoralis muscle into chest 115 - pull in the pectoralis - some cording now noted   Shoulder internal rotation       Shoulder external rotation 90                             (Blank rows = not tested)   CERVICAL AROM: All within normal limits:    LYMPHEDEMA ASSESSMENTS:    [facility] RIGHT  06/17/2021 6/8/23Rt 6/8/23Lt  10 cm proximal to olecranon process 36.5 36.5   Olecranon process 23.5 23.5   10 cm proximal to ulnar styloid process 20.6 20.6   Just proximal to ulnar styloid process 15.3 15.3   Across hand at thumb web space 18 18   At base of 2nd digit 5.8 5.8   (Blank rows = not tested)  TODAY'S TREATMENT:  08/15/21: Therapeutic Exs:  Pulleys: x2 mins into flex and abduction   Ball roll up wall into flexion x10 and Rt abd x10   Manual Therapy: STM in supine to Rt chest wall and pectoralis insertion Small cording  may be palpable and noted to be pulling in abduction but not visible.  MFR to Rt axilla during P/ROM P/ROM in supine to Rt shoulder to per tolerance into flexion,abduction with pt able to achieve full ROM  08/12/21: Therapeutic Exs:  Pulleys: x2 mins into flex and scaption with  pt returning therapist demo and VCs during to relax  Ball roll up wall into flexion x10 and Rt abd x7 returning therapist demo and cuing not to push into pain  Modified downward dog on wall x7, 5 sec holds returning therapist demo  Manual Therapy: STM in supine to Rt chest wall and pectoralis insertion Small cording may be palpable and noted to be pulling in abduction but not visible.  MFR to Rt axilla during P/ROM not tolerated as well today due to pectoralis spasm P/ROM in supine to Rt shoulder to per tolerance into flexion, abd and D2 along with er   08/06/21 Therapeutic Exs:  Pulleys: x2 mins into flex and scaption with pt returning therapist demo and VCs during to relax  Manual Therapy: STM in supine to Rt chest wall and pectoralis insertion able to prop the arm on 2 pillows in radiation type position today during STM. Small cording may be palpable and noted to be pulling in abduction but not visible. May explain new tenderness and puffiness in axilla.  MFR to Rt axilla during P/ROM not tolerated as well today due to pectoralis spasm P/ROM in supine to Rt shoulder to per tolerance into flexion, abd and D2 along with er  08/01/21 Pt has some new redness along the incision which normally would look unconcerning but as she has not had this redness at all x 5 weeks and pt reports it was larger than this earlier I sent a note to Dr. Barry Dienes for possible follow up.  Therapeutic Exs:  Pulleys: x2 mins into flex and scaption with pt returning therapist demo and VCs during to relax  Ball roll up wall into flexion x10 with VCs to relax shoulders and only push into tolerate stretch  Supine dowel flexion x 10   Manual Therapy:  STM in supine to Rt chest wall and pectoralis insertion held due to new redness P/ROM in supine to Rt shoulder to per tolerance into flexion, abd and D2 along with er Sent picture to Dr. Barry Dienes regarding new redness at the incision - photo from today   PATIENT EDUCATION:   Education details: updated HEP, POC Person educated: Patient Education method: Explanation, Demonstration, Tactile cues, and Handouts Education comprehension: verbalized understanding, returned demonstration, verbal cues required, tactile cues required, and needs further education  HOME EXERCISE PROGRAM:  Reviewed previously given post op HEP with changes as needed:  Access Code: [email or web address] URL: [email or web address] Date: 07/18/2021 Prepared by: Shan Levans  Exercises - Supine Shoulder Flexion AAROM with Hands Clasped - 1-2 x daily - 7 x weekly - 1 sets - 10 reps - 5 second hold - Supine Chest Stretch with Elbows Bent - 1 x daily - 7 x weekly - 1 sets - 3 reps - 30-60seconds hold - Standing Shoulder Abduction AAROM with Dowel - 1-2 x daily - 7 x weekly - 1 sets - 10 reps - 5 second hold - Supine Thoracic Mobilization Towel Roll Vertical with Arm Stretch - 2 x daily - 7 x weekly - 1 sets - 1 reps - 20-30 seconds hold  ASSESSMENT:  CLINICAL IMPRESSION: Discomfort from port seems to be less than last time but still present. Pt will  start chemo tomorrow. She still is having increased tightness across R pec so focused on STM and MFR to this area to improve ROM and decrease discomfort.   Pt will benefit from skilled therapeutic intervention to improve on the following deficits: Decreased knowledge of precautions, impaired UE functional use, pain, decreased ROM, postural dysfunction.   PT treatment/interventions: ADL/Self care home management, Therapeutic exercises, Patient/Family education, and Manual therapy   GOALS: Goals reviewed with patient? Yes  LONG TERM GOALS:  (STG=LTG)  GOALS Name Target Date  Goal status  1 Pt will demonstrate she has regained full shoulder ROM and function post operatively compared to baselines.  Baseline: 08/15/2021 IN PROGRESS  2 Pt will be educated on lymphedema risk reduction- ABC class - and SOZO surveillance 08/15/21 INITIAL  3 Pt will feel  ind with continued HEP 08/15/21 INITIAL          PLAN: PT FREQUENCY/DURATION: 1-2x per week for 4 weeks  PLAN FOR NEXT SESSION: continue MFR and STM to R pec, Begin in person education for ABC class as pt will not be able to make the next one; cont PROM and STM Rt UE, AAROM activities    [employer], PT 08/15/2021, 3:02 PM

## 2021-08-16 ENCOUNTER — Inpatient Hospital Stay (HOSPITAL_BASED_OUTPATIENT_CLINIC_OR_DEPARTMENT_OTHER): Payer: BC Managed Care – PPO | Admitting: Hematology & Oncology

## 2021-08-16 ENCOUNTER — Inpatient Hospital Stay: Payer: BC Managed Care – PPO

## 2021-08-16 ENCOUNTER — Encounter: Payer: Self-pay | Admitting: *Deleted

## 2021-08-16 ENCOUNTER — Encounter: Payer: Self-pay | Admitting: Hematology & Oncology

## 2021-08-16 ENCOUNTER — Other Ambulatory Visit: Payer: Self-pay

## 2021-08-16 VITALS — BP 118/66 | HR 81 | Temp 97.8°F | Resp 18 | Ht 64.0 in | Wt 140.0 lb

## 2021-08-16 VITALS — BP 106/76 | HR 58 | Temp 98.0°F | Resp 16

## 2021-08-16 DIAGNOSIS — C50911 Malignant neoplasm of unspecified site of right female breast: Secondary | ICD-10-CM | POA: Diagnosis not present

## 2021-08-16 DIAGNOSIS — J329 Chronic sinusitis, unspecified: Secondary | ICD-10-CM | POA: Diagnosis not present

## 2021-08-16 DIAGNOSIS — C50611 Malignant neoplasm of axillary tail of right female breast: Secondary | ICD-10-CM

## 2021-08-16 DIAGNOSIS — Z9011 Acquired absence of right breast and nipple: Secondary | ICD-10-CM | POA: Diagnosis not present

## 2021-08-16 DIAGNOSIS — Z5111 Encounter for antineoplastic chemotherapy: Secondary | ICD-10-CM | POA: Diagnosis present

## 2021-08-16 DIAGNOSIS — C50011 Malignant neoplasm of nipple and areola, right female breast: Secondary | ICD-10-CM

## 2021-08-16 DIAGNOSIS — Z170 Estrogen receptor positive status [ER+]: Secondary | ICD-10-CM | POA: Diagnosis not present

## 2021-08-16 DIAGNOSIS — Z5189 Encounter for other specified aftercare: Secondary | ICD-10-CM | POA: Diagnosis not present

## 2021-08-16 DIAGNOSIS — C50811 Malignant neoplasm of overlapping sites of right female breast: Secondary | ICD-10-CM

## 2021-08-16 LAB — CMP (CANCER CENTER ONLY)
ALT: 31 U/L (ref 0–44)
AST: 18 U/L (ref 15–41)
Albumin: 4.5 g/dL (ref 3.5–5.0)
Alkaline Phosphatase: 78 U/L (ref 38–126)
Anion gap: 10 (ref 5–15)
BUN: 21 mg/dL — ABNORMAL HIGH (ref 6–20)
CO2: 24 mmol/L (ref 22–32)
Calcium: 9.9 mg/dL (ref 8.9–10.3)
Chloride: 105 mmol/L (ref 98–111)
Creatinine: 0.81 mg/dL (ref 0.44–1.00)
GFR, Estimated: >60 mL/min (ref >60)
Glucose, Bld: 205 mg/dL — ABNORMAL HIGH (ref 70–99)
Potassium: 3.5 mmol/L (ref 3.5–5.1)
Sodium: 139 mmol/L (ref 135–145)
Total Bilirubin: 0.4 mg/dL (ref 0.3–1.2)
Total Protein: 6.6 g/dL (ref 6.5–8.1)

## 2021-08-16 LAB — CBC WITH DIFFERENTIAL (CANCER CENTER ONLY)
Abs Immature Granulocytes: 0.14 10*3/uL — ABNORMAL HIGH (ref 0.00–0.07)
Basophils Absolute: 0 10*3/uL (ref 0.0–0.1)
Basophils Relative: 0 %
Eosinophils Absolute: 0 10*3/uL (ref 0.0–0.5)
Eosinophils Relative: 0 %
HCT: 41.6 % (ref 36.0–46.0)
Hemoglobin: 13.9 g/dL (ref 12.0–15.0)
Immature Granulocytes: 1 %
Lymphocytes Relative: 18 %
Lymphs Abs: 3.4 10*3/uL (ref 0.7–4.0)
MCH: 30.1 pg (ref 26.0–34.0)
MCHC: 33.4 g/dL (ref 30.0–36.0)
MCV: 90 fL (ref 80.0–100.0)
Monocytes Absolute: 1 10*3/uL (ref 0.1–1.0)
Monocytes Relative: 5 %
Neutro Abs: 14.4 10*3/uL — ABNORMAL HIGH (ref 1.7–7.7)
Neutrophils Relative %: 76 %
Platelet Count: 256 10*3/uL (ref 150–400)
RBC: 4.62 MIL/uL (ref 3.87–5.11)
RDW: 13.2 % (ref 11.5–15.5)
WBC Count: 18.9 10*3/uL — ABNORMAL HIGH (ref 4.0–10.5)
nRBC: 0 % (ref 0.0–0.2)

## 2021-08-16 MED ORDER — PALONOSETRON HCL INJECTION 0.25 MG/5ML
0.2500 mg | Freq: Once | INTRAVENOUS | Status: AC
  Administered 2021-08-16: 0.25 mg via INTRAVENOUS
  Filled 2021-08-16: qty 5

## 2021-08-16 MED ORDER — SODIUM CHLORIDE 0.9 % IV SOLN
10.0000 mg | Freq: Once | INTRAVENOUS | Status: AC
  Administered 2021-08-16: 10 mg via INTRAVENOUS
  Filled 2021-08-16: qty 10

## 2021-08-16 MED ORDER — DEXTROSE 5 % IV SOLN
2.0000 g | Freq: Once | INTRAVENOUS | Status: AC
  Administered 2021-08-16: 2 g via INTRAVENOUS
  Filled 2021-08-16: qty 20

## 2021-08-16 MED ORDER — SODIUM CHLORIDE 0.9 % IV SOLN
67.5000 mg/m2 | Freq: Once | INTRAVENOUS | Status: AC
  Administered 2021-08-16: 110 mg via INTRAVENOUS
  Filled 2021-08-16: qty 11

## 2021-08-16 MED ORDER — DEXTROSE 5 % IV SOLN: 2.0000 g | INTRAVENOUS | Status: DC

## 2021-08-16 MED ORDER — SODIUM CHLORIDE 0.9% FLUSH
10.0000 mL | INTRAVENOUS | Status: DC | PRN
  Administered 2021-08-16: 10 mL

## 2021-08-16 MED ORDER — HEPARIN SOD (PORK) LOCK FLUSH 100 UNIT/ML IV SOLN
500.0000 [IU] | Freq: Once | INTRAVENOUS | Status: AC | PRN
  Administered 2021-08-16: 500 [IU]

## 2021-08-16 MED ORDER — SODIUM CHLORIDE 0.9 % IV SOLN: Freq: Once | INTRAVENOUS | Status: AC

## 2021-08-16 MED ORDER — CEFDINIR 300 MG PO CAPS: 600.0000 mg | ORAL_CAPSULE | Freq: Every day | ORAL | 0 refills | Status: DC

## 2021-08-16 MED ORDER — SODIUM CHLORIDE 0.9 % IV SOLN
600.0000 mg/m2 | Freq: Once | INTRAVENOUS | Status: AC
  Administered 2021-08-16: 1000 mg via INTRAVENOUS
  Filled 2021-08-16: qty 20

## 2021-08-16 NOTE — Progress Notes (Signed)
Patient is here to begin treatment. She had a port placed, and completed chemo education yesterday.   She is in the bedroom for her first cycle so that her husband can stay with her. Reviewed with her the oncall service, treatment length and her follow up appointment on Monday.   Currently has no questions or concerns.   Oncology Nurse Navigator Documentation     08/16/2021   10:15 AM  Oncology Nurse Navigator Flowsheets  Planned Course of Treatment Chemotherapy  Phase of Treatment Chemo  Chemotherapy Actual Start Date: 08/16/2021  Chemotherapy Expected End Date: 10/25/2021  Navigator Follow Up Date: 09/13/2021  Navigator Follow Up Reason: Follow-up Appointment;Chemotherapy  Navigator Location [facility]  Navigator Encounter Type Treatment;Appt/Treatment Plan Review  Patient Visit Type MedOnc  Treatment Phase First Chemo Tx  Barriers/Navigation Needs Coordination of Care;Education  Interventions Psycho-Social Support  Acuity Level 2-Minimal Needs (1-2 Barriers Identified)  Support Groups/Services Friends and Family  Time Spent with Patient 15

## 2021-08-16 NOTE — Patient Instructions (Signed)
[facility]  Discharge Instructions: Thank you for choosing [facility] to provide your oncology and hematology care.   If you have a lab appointment with the [facility], please go directly to the [facility] and check in at the registration area.  Wear comfortable clothing and clothing appropriate for easy access to any Portacath or PICC line.   We strive to give you quality time with your provider. You may need to reschedule your appointment if you arrive late (15 or more minutes).  Arriving late affects you and other patients whose appointments are after yours.  Also, if you miss three or more appointments without notifying the office, you may be dismissed from the clinic at the provider's discretion.      For prescription refill requests, have your pharmacy contact our office and allow 72 hours for refills to be completed.    Today you received the following chemotherapy and/or immunotherapy agents Taxotere, Cytoxan      To help prevent nausea and vomiting after your treatment, we encourage you to take your nausea medication as directed.  BELOW ARE SYMPTOMS THAT SHOULD BE REPORTED IMMEDIATELY: *FEVER GREATER THAN 100.4 F (38 C) OR HIGHER *CHILLS OR SWEATING *NAUSEA AND VOMITING THAT IS NOT CONTROLLED WITH YOUR NAUSEA MEDICATION *UNUSUAL SHORTNESS OF BREATH *UNUSUAL BRUISING OR BLEEDING *URINARY PROBLEMS (pain or burning when urinating, or frequent urination) *BOWEL PROBLEMS (unusual diarrhea, constipation, pain near the anus) TENDERNESS IN MOUTH AND THROAT WITH OR WITHOUT PRESENCE OF ULCERS (sore throat, sores in mouth, or a toothache) UNUSUAL RASH, SWELLING OR PAIN  UNUSUAL VAGINAL DISCHARGE OR ITCHING   Items with * indicate a potential emergency and should be followed up as soon as possible or go to the Emergency Department if any problems should occur.  Please show the CHEMOTHERAPY ALERT CARD or IMMUNOTHERAPY ALERT CARD at check-in  to the Emergency Department and triage nurse. Should you have questions after your visit or need to cancel or reschedule your appointment, please contact [facility]  [phone] and follow the prompts.  Office hours are 8:00 a.m. to 4:30 p.m. Monday - Friday. Please note that voicemails left after 4:00 p.m. may not be returned until the following business day.  We are closed weekends and major holidays. You have access to a nurse at all times for urgent questions. Please call the main number to the clinic [phone] and follow the prompts.  For any non-urgent questions, you may also contact your provider using MyChart. We now offer e-Visits for anyone 96 and older to request care online for non-urgent symptoms. For details visit [email or web address].   Also download the MyChart app! Go to the app store, search "MyChart", open the app, select [facility], and log in with your MyChart username and password.  Masks are optional in the cancer centers. If you would like for your care team to wear a mask while they are taking care of you, please let them know. For doctor visits, patients may have with them one support person who is at least 55 years old. At this time, visitors are not allowed in the infusion area.

## 2021-08-16 NOTE — Progress Notes (Signed)
Hematology and Oncology Follow Up Visit  Rachel [name] [facility] [id number] [birth year] 56 y.o. 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 is back for followup.  She will start chemotherapy today.  She looks good.  She feels okay.  She may have a little bit of a sinus infection.  I will go ahead and get her on some IV Rocephin today.  I think that we can go ahead with treatment.  Her white cell count is elevated which I think is probably from the Decadron that she is taking.  She has had no fever.  There is been no bleeding.  Her right mastectomy site is healing nicely.  She is getting physical therapy twice a week to help with the skin and to help with any swelling.  She has had no change in bowel or bladder habits.  Overall, performance status is ECOG 1.    Medications:  Current Outpatient Medications:    acetaminophen (TYLENOL) 500 MG tablet, Take 1,000 mg by mouth every 6 (six) hours as needed for moderate pain or mild pain., Disp: , Rfl:    butalbital-acetaminophen-caffeine (FIORICET, ESGIC) 50-325-40 MG per tablet, TAKE 1 TABLET BY MOUTH EVERY 6 HOURS AS NEEDED FOR HEADACHE, Disp: 20 tablet, Rfl: 0   Cholecalciferol (VITAMIN D PO), Take 1,000 Units by mouth daily., Disp: , Rfl:    dexamethasone (DECADRON) 4 MG tablet, Take 2 tablets (8 mg total) by mouth 2 (two) times daily. Start the dexamethasone 2 days before each chemotherapy cycle.  Take for 5 days total each cycle., Disp: 60 tablet, Rfl: 1   fluticasone (FLONASE) 50 MCG/ACT nasal spray, INHALE 2 SPRASY IN EACH NOSTRIL ONCE DAILY (Patient taking differently: Place 2 sprays into both nostrils daily as needed for allergies or rhinitis.), Disp: 16 g, Rfl: 11   gabapentin (NEURONTIN) 100 MG capsule,  Take 1 capsule (100 mg total) by mouth 2 (two) times daily. (Patient taking differently: Take 100 mg by mouth daily as needed (Nerve pain).), Disp: 30 capsule, Rfl: 1   ibuprofen (ADVIL) 200 MG tablet, Take 400-600 mg by mouth every 6 (six) hours as needed for mild pain or moderate pain., Disp: , Rfl:    lidocaine-prilocaine (EMLA) cream, Apply to affected area once, Disp: 30 g, Rfl: 3   methocarbamol (ROBAXIN) 500 MG tablet, Take 1 tablet (500 mg total) by mouth every 6 (six) hours as needed for muscle spasms., Disp: 20 tablet, Rfl: 1   ondansetron (ZOFRAN) 8 MG tablet, Take 1 tablet (8 mg total) by mouth 2 (two) times daily as needed for refractory nausea / vomiting. Start on day 3 after chemo., Disp: 30 tablet, Rfl: 1   oxyCODONE (OXY IR/ROXICODONE) 5 MG immediate release tablet, Take 1 tablet (5 mg total) by mouth once as needed (for pain score of 1-4)., Disp: 30 tablet, Rfl: 0   pantoprazole (PROTONIX) 40 MG tablet, TAKE 1 TABLET BY MOUTH EVERY DAY (Patient taking differently: Take 40 mg by mouth daily as needed (Heartburn).), Disp: 90 tablet, Rfl: 3   prochlorperazine (COMPAZINE) 10 MG tablet, Take 1 tablet (10 mg total) by mouth every 6 (six) hours as needed (Nausea or vomiting)., Disp: 30 tablet, Rfl: 1   promethazine (PHENERGAN) 25 MG tablet, TAKE 1 TABLET BY MOUTH  EVERY 8 HOURS AS NEEDED FOR NAUSEA/VOMITING, Disp: 20 tablet, Rfl: 0   rizatriptan (MAXALT) 10 MG tablet, TAKE 1 TABLET BY MOUTH EVERY DAY AS NEEDED FOR MIGRANE, Disp: 6 tablet, Rfl: 5   UNITHROID 112 MCG tablet, TAKE 1 TABLET BY MOUTH EVERY DAY BEFORE BREAKFAST, Disp: 90 tablet, Rfl: 1  Current Facility-Administered Medications:    cefTRIAXone (ROCEPHIN) 2 g in dextrose 5 % 50 mL IVPB, 2 g, Intravenous, Q24H, Chriss Mannan, Rudell Cobb, MD  Allergies:  Allergies  Allergen Reactions   Anesthetics, Ester Nausea And Vomiting   Erythromycin Nausea And Vomiting    Past Medical History, Surgical history, Social history, and Family History  were reviewed and updated.  Review of Systems: Review of Systems  Constitutional: Negative.   HENT:  Positive for congestion.   Eyes: Negative.   Respiratory: Negative.    Cardiovascular: Negative.   Gastrointestinal: Negative.   Genitourinary: Negative.   Musculoskeletal: Negative.   Skin: Negative.   Neurological: Negative.   Endo/Heme/Allergies: Negative.   Psychiatric/Behavioral: Negative.       Physical Exam:  height is 5' 4"  (1.626 m) and weight is 140 lb (63.5 kg). Her oral temperature is 97.8 F (36.6 C). Her blood pressure is 118/66 and her pulse is 81. Her respiration is 18 and oxygen saturation is 100%.   Physical Exam Vitals reviewed.  Constitutional:      Comments: Her breast exam shows right mastectomy.  This is well-healed.  There is no nodularity.  There is a little bit of numbness along the mastectomy site.  There is no right axillary adenopathy.  Left breast is unremarkable.  There is no mass in the left breast.  There is no left axillary adenopathy.    HENT:     Head: Normocephalic and atraumatic.  Eyes:     Pupils: Pupils are equal, round, and reactive to light.  Cardiovascular:     Rate and Rhythm: Normal rate and regular rhythm.     Heart sounds: Normal heart sounds.  Pulmonary:     Effort: Pulmonary effort is normal.     Breath sounds: Normal breath sounds.  Abdominal:     General: Bowel sounds are normal.     Palpations: Abdomen is soft.  Musculoskeletal:        General: No tenderness or deformity. Normal range of motion.     Cervical back: Normal range of motion.  Lymphadenopathy:     Cervical: No cervical adenopathy.  Skin:    General: Skin is warm and dry.     Findings: No erythema or rash.  Neurological:     Mental Status: She is alert and oriented to person, place, and time.  Psychiatric:        Behavior: Behavior normal.        Thought Content: Thought content normal.        Judgment: Judgment normal.     Lab Results  Component  Value Date   WBC 18.9 (H) 08/16/2021   HGB 13.9 08/16/2021   HCT 41.6 08/16/2021   MCV 90.0 08/16/2021   PLT 256 08/16/2021     Chemistry      Component Value Date/Time   NA 139 08/16/2021 0846   NA 140 09/26/2016 0743   NA 143 09/21/2015 0854   K 3.5 08/16/2021 0846   K 3.8 09/26/2016 0743   K 4.4 09/21/2015 0854   CL 105 08/16/2021 0846   CL 104 09/26/2016 0743   CO2 24 08/16/2021 0846  CO2 30 09/26/2016 0743   CO2 28 09/21/2015 0854   BUN 21 (H) 08/16/2021 0846   BUN 14 09/26/2016 0743   BUN 13.9 09/21/2015 0854   CREATININE 0.81 08/16/2021 0846   CREATININE 0.87 01/01/2018 1055   CREATININE 0.8 09/21/2015 0854      Component Value Date/Time   CALCIUM 9.9 08/16/2021 0846   CALCIUM 9.4 09/26/2016 0743   CALCIUM 10.1 09/21/2015 0854   ALKPHOS 78 08/16/2021 0846   ALKPHOS 80 09/26/2016 0743   ALKPHOS 98 09/21/2015 0854   AST 18 08/16/2021 0846   AST 37 (H) 09/21/2015 0854   ALT 31 08/16/2021 0846   ALT 35 09/26/2016 0743   ALT 57 (H) 09/21/2015 0854   BILITOT 0.4 08/16/2021 0846   BILITOT 0.53 09/21/2015 0854      Impression and Plan: Rachel [name] is a 56 year old white female with a history of stage I ductal carcinoma of the right breast. She was diagnosed 22 years ago. 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 I  think that  she is cured.  Now, she has a second breast cancer.  Again I feel this is not a recurrence in the breast.  I feel this is a second breast cancer.  We will go ahead with her chemotherapy.  I will dose reduce the Taxotere just a little bit.  I think this would be reasonable for her.  She and her family are going on vacation and a couple weeks.  As such, we will move her second cycle back 1 week.  I will send in some [place] for her.  She will take this for 10 days.  I will plan to get her back when she has her second cycle of treatment.    Volanda Napoleon, MD 7/7/20239:49 AM

## 2021-08-19 ENCOUNTER — Inpatient Hospital Stay: Payer: BC Managed Care – PPO

## 2021-08-19 VITALS — BP 116/59 | HR 53 | Temp 97.9°F | Resp 16

## 2021-08-19 DIAGNOSIS — C50811 Malignant neoplasm of overlapping sites of right female breast: Secondary | ICD-10-CM

## 2021-08-19 DIAGNOSIS — C50911 Malignant neoplasm of unspecified site of right female breast: Secondary | ICD-10-CM | POA: Diagnosis not present

## 2021-08-19 MED ORDER — PEGFILGRASTIM-CBQV 6 MG/0.6ML ~~LOC~~ SOSY
6.0000 mg | PREFILLED_SYRINGE | Freq: Once | SUBCUTANEOUS | Status: AC
  Administered 2021-08-19: 6 mg via SUBCUTANEOUS
  Filled 2021-08-19: qty 0.6

## 2021-08-19 NOTE — Patient Instructions (Signed)

## 2021-08-20 ENCOUNTER — Ambulatory Visit: Payer: BC Managed Care – PPO | Admitting: Rehabilitation

## 2021-08-21 ENCOUNTER — Encounter: Payer: Self-pay | Admitting: Hematology & Oncology

## 2021-08-23 ENCOUNTER — Other Ambulatory Visit: Payer: Self-pay | Admitting: *Deleted

## 2021-08-23 ENCOUNTER — Encounter: Payer: Self-pay | Admitting: *Deleted

## 2021-08-23 ENCOUNTER — Other Ambulatory Visit: Payer: Self-pay | Admitting: Family

## 2021-08-23 ENCOUNTER — Telehealth: Payer: Self-pay | Admitting: *Deleted

## 2021-08-23 DIAGNOSIS — M898X9 Other specified disorders of bone, unspecified site: Secondary | ICD-10-CM

## 2021-08-23 HISTORY — DX: Other specified disorders of bone, unspecified site: M89.8X9

## 2021-08-23 MED ORDER — OXYCODONE HCL 5 MG PO TABS
5.0000 mg | ORAL_TABLET | Freq: Once | ORAL | 0 refills | Status: DC | PRN
Start: 2021-08-23 — End: 2022-01-17

## 2021-08-23 NOTE — Telephone Encounter (Signed)
Call received from patient stating that she had "terrible aching pain last night that feels like nails are driving into her spine and hips" after receiving Udenyca on 08/19/21.  She states that she is alternating taking Advil and Tylenol with no relief.  Pt states that she does have Oxycodone 5 mg on hand at home.  Pt instructed to take Oxycodone 5 mg PO now per order of S. Eulas Post NP and to call the office back in an hour to inform us of her pain level.  Pt is appreciative of assistance and states that she will call office back.

## 2021-08-23 NOTE — Telephone Encounter (Signed)
Call received from patient stating her pain is less and would like to know what to do going forward.  Pt notified per order of S. Eulas Post NP to alternate Oxycodone 5 mg every 6 hours with Tylenol or Advil in between Oxycodone doses.  Pt is appreciative of assistance and has no further questions at this time.

## 2021-08-26 ENCOUNTER — Encounter (HOSPITAL_COMMUNITY): Payer: Self-pay

## 2021-08-26 NOTE — Progress Notes (Signed)
Prior authorization for Oxycodone 5 mg submitted to [place] via CoverMyMeds on 08/23/21.  PA approved from 08/23/21-08/24/22.

## 2021-09-02 ENCOUNTER — Other Ambulatory Visit: Payer: Self-pay

## 2021-09-10 ENCOUNTER — Ambulatory Visit: Payer: BC Managed Care – PPO | Attending: General Surgery | Admitting: Rehabilitation

## 2021-09-10 DIAGNOSIS — C50611 Malignant neoplasm of axillary tail of right female breast: Secondary | ICD-10-CM | POA: Insufficient documentation

## 2021-09-10 DIAGNOSIS — Z483 Aftercare following surgery for neoplasm: Secondary | ICD-10-CM | POA: Insufficient documentation

## 2021-09-10 DIAGNOSIS — Z171 Estrogen receptor negative status [ER-]: Secondary | ICD-10-CM | POA: Insufficient documentation

## 2021-09-10 DIAGNOSIS — M25611 Stiffness of right shoulder, not elsewhere classified: Secondary | ICD-10-CM | POA: Insufficient documentation

## 2021-09-10 DIAGNOSIS — R293 Abnormal posture: Secondary | ICD-10-CM | POA: Insufficient documentation

## 2021-09-10 NOTE — Therapy (Signed)
OUTPATIENT PHYSICAL THERAPY BREAST CANCER TREATMENT NOTE   Patient Name: Rachel [name] MRN: [id number] DOB:[birth year], 56 y.o., female Today's Date: 09/10/2021   PT End of Session - 09/10/21 1551     Visit Number 9    Number of Visits 15    Date for PT Re-Evaluation 10/22/21    PT Start Time 1505    PT Stop Time 1550    PT Time Calculation (min) 45 min    Activity Tolerance Patient tolerated treatment well    Behavior During Therapy [facility] for tasks assessed/performed                Past Medical History:  Diagnosis Date   ASCUS of cervix with negative high risk HPV 03/2018   Bone pain due to G-CSF 08/23/2021   Cancer ([place]) 2001   HISTORY OF STAGE I INFILTRATING DUCTAL CARCINOMA OF RIGHT BREAST   Chiari malformation type I ([place])    Chronic headache    Family history of adverse reaction to anesthesia    Family history of uterine cancer    Hypercholesteremia    diet controlled   Hypothyroidism    on meds   Osteopenia 09/2018   T score -2.4.  Prior T score -2.5.  DEXA stable from prior study.   Personal history of chemotherapy 2001   Personal history of radiation therapy 2001   Pneumonia    Had 12-2017   PONV (postoperative nausea and vomiting)    Past Surgical History:  Procedure Laterality Date   BREAST EXCISIONAL BIOPSY Left 2021   BREAST LUMPECTOMY Right 2001   CESAREAN SECTION  1991   COLONOSCOPY     MASTECTOMY W/ SENTINEL NODE BIOPSY Right 06/25/2021   Procedure: RIGHT MASTECTOMY WITH SENTINEL LYMPH NODE BIOPSY;  Surgeon: Stark Klein, MD;  Location: [facility];  Service: General;  Laterality: Right;   NASAL SINUS SURGERY Right 02/20/2020   Procedure: ENDOSCOPIC SINUS SURGERY WITH RIGHT ETHMOIDECTOMY AND RIGHT MAXILLARY OSTIA ENLARGEMENT WITH FUSION;  Surgeon: Rozetta Nunnery, MD;  Location: [facility];  Service: ENT;  Laterality: Right;   PELVIC LAPAROSCOPY     endometriosis   PORTACATH PLACEMENT N/A 08/07/2021    Procedure: PORT PLACEMENT WITH ULTRASOUND GUIDANCE;  Surgeon: Stark Klein, MD;  Location: [facility];  Service: General;  Laterality: N/A;   SINUS ENDO WITH FUSION Right 02/20/2020   Procedure: SINUS ENDO WITH FUSION;  Surgeon: Rozetta Nunnery, MD;  Location: [facility];  Service: ENT;  Laterality: Right;   [facility]   Patient Active Problem List   Diagnosis Date Noted   Bone pain due to G-CSF 08/23/2021   Cancer of overlapping sites of right female breast ([place]) 06/25/2021   Lateral epicondylitis, left elbow 03/04/2021   Left wrist pain 06/24/2019   Pain in joint of left shoulder 06/24/2019   Pain in joint of left elbow 06/24/2019   Family history of uterine cancer    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  Next infusion is Friday.  It is the cording.  It feels like it is down the whole arm. It was fine for awhile   PERTINENT HISTORY:  Patient was diagnosed on 06/03/21 with right breast cancer recurrence. Initial lumpectomy, SLNB x 3,  radiation, chemotherapy, and antiestrogens. Rt mastectomy on 06/25/21 with Dr. Barry Dienes with another SLNB 0/3 positive lymph nodes.  Other hx: chiari malformation   PATIENT GOALS:  Reassess how my recovery is going related to arm function, pain, and swelling.  PAIN:  Are you having pain? No, just tightness in the arm and across the chest  PRECAUTIONS: Recent Surgery, right UE Lymphedema risk, 6 total lymph nodes removed x 2 breast cancers OBJECTIVE:  PATIENT SURVEYS:  QUICK DASH: 80%  OBSERVATIONS:  Very well healed very flat mastectomy incision. No edema or redness  Pectoralis axillary border tight with spasm during Abduction  POSTURE:  Slightly guarded  LYMPHEDEMA ASSESSMENT:   UPPER EXTREMITY AROM/PROM:   A/PROM RIGHT  06/17/2021   07/18/21 07/30/21 08/06/21 09/10/21  Shoulder extension 53    50  Shoulder flexion 170 145 - feeling tight 155 158 - pull in axilla 160 - pull in the axilla  Shoulder abduction 170 114 130 - pectoralis muscle into chest 115 - pull in the pectoralis - some cording now noted  130 - pulling down arm   Shoulder internal rotation        Shoulder external rotation 90    90                          (Blank rows = not tested)   CERVICAL AROM: All within normal limits:    LYMPHEDEMA ASSESSMENTS:    [facility] RIGHT  06/17/2021 6/8/23Rt 6/8/23Lt  10 cm proximal to olecranon process 36.5 36.5   Olecranon process 23.5 23.5   10 cm proximal to ulnar styloid process 20.6 20.6   Just proximal to ulnar styloid process 15.3 15.3   Across hand at thumb web space 18 18   At base of 2nd digit 5.8 5.8   (Blank rows = not tested)  TODAY'S TREATMENT: 09/10/21 Rechecked  AROM and cording status Rechecked SOZO which is still WNL MFR Rt UE following cording, STM along cording from axilla to wrist in various positions of the UE.  08/15/21: Therapeutic Exs:  Pulleys: x2 mins into flex and abduction   Ball roll up wall into flexion x10 and Rt abd x10   Manual Therapy: STM in  supine to Rt chest wall and pectoralis insertion Small cording may be palpable and noted to be pulling in abduction but not visible.  MFR to Rt axilla during P/ROM P/ROM in supine to Rt shoulder to per tolerance into flexion,abduction with pt able to achieve full ROM  08/12/21: Therapeutic Exs:  Pulleys: x2 mins into flex and scaption with pt returning therapist demo and VCs during to relax  Ball roll up wall into flexion x10 and Rt abd x7 returning therapist demo and cuing not to push into pain  Modified downward dog on wall x7, 5 sec holds returning therapist demo  Manual Therapy: STM in supine to Rt chest wall and pectoralis insertion Small cording may be palpable and noted to be pulling in abduction but not visible.  MFR to Rt axilla during P/ROM not tolerated as well today due to pectoralis spasm P/ROM in supine to Rt shoulder to per tolerance into flexion, abd and D2 along with er   PATIENT EDUCATION:  Education details: updated HEP, POC Person educated: Patient Education method: Explanation, Demonstration, Tactile cues, and Handouts Education comprehension: verbalized understanding, returned demonstration, verbal cues required, tactile cues required, and needs further education  HOME EXERCISE PROGRAM: Reviewed previously given post op HEP with changes as needed: Access Code: [email or web address] URL: [email or web address] Date: 07/18/2021 Prepared by: Shan Levans  Exercises - Supine Shoulder Flexion AAROM with Hands Clasped - 1-2 x daily - 7 x weekly - 1 sets - 10 reps - 5 second hold - Supine Chest Stretch with Elbows Bent - 1 x daily - 7 x weekly - 1 sets - 3 reps - 30-60seconds hold - Standing Shoulder Abduction AAROM with Dowel - 1-2 x daily - 7 x weekly - 1 sets - 10 reps - 5 second hold - Supine Thoracic Mobilization Towel Roll Vertical with Arm Stretch - 2 x daily - 7 x weekly - 1 sets - 1 reps - 20-30 seconds hold  ASSESSMENT CLINICAL IMPRESSION: Pt returns with  worsening cording down to the wrist.  Pt noticed it last week when at the beach.  Did note that pt had started steroids for chemotherapy and may have eliminated the cording and pain and now it is just noticeable again.  Continued MFR/PROM/STM with minimal pull after MT.  Will extend POC and work around chemo sessions.   Pt will benefit from skilled therapeutic intervention to improve on the following deficits: Decreased knowledge of precautions, impaired UE functional use, pain, decreased ROM, postural dysfunction.   PT treatment/interventions: ADL/Self care home management, Therapeutic exercises, Patient/Family education, and Manual therapy   GOALS: Goals reviewed with patient? Yes  LONG TERM GOALS:  (STG=LTG)  GOALS Name Target Date  Goal status  1 Pt will demonstrate she has regained full shoulder ROM and function post operatively compared to baselines.  Baseline: 08/15/2021 IN PROGRESS  2 Pt will be educated on lymphedema risk reduction- ABC class - and SOZO surveillance 08/15/21 INITIAL  3 Pt will feel ind with continued HEP 08/15/21 INITIAL          PLAN: PT FREQUENCY/DURATION: 1x per week for 6 weeks  PLAN FOR NEXT SESSION: *Schedule  out SOZO*  continue MFR and STM to [employer], Begin in person education for ABC class as pt will not be able to make the next one; cont PROM and STM Rt UE, AAROM activities    Stark Bray, PT 09/10/2021, 3:52 PM

## 2021-09-12 DIAGNOSIS — M79674 Pain in right toe(s): Secondary | ICD-10-CM | POA: Insufficient documentation

## 2021-09-13 ENCOUNTER — Inpatient Hospital Stay: Payer: BC Managed Care – PPO | Attending: Hematology & Oncology

## 2021-09-13 ENCOUNTER — Inpatient Hospital Stay: Payer: BC Managed Care – PPO

## 2021-09-13 ENCOUNTER — Other Ambulatory Visit: Payer: Self-pay

## 2021-09-13 ENCOUNTER — Encounter: Payer: Self-pay | Admitting: Hematology & Oncology

## 2021-09-13 ENCOUNTER — Inpatient Hospital Stay (HOSPITAL_BASED_OUTPATIENT_CLINIC_OR_DEPARTMENT_OTHER): Payer: BC Managed Care – PPO | Admitting: Hematology & Oncology

## 2021-09-13 ENCOUNTER — Other Ambulatory Visit: Payer: Self-pay | Admitting: *Deleted

## 2021-09-13 VITALS — BP 123/59 | HR 58 | Resp 17

## 2021-09-13 VITALS — BP 125/55 | HR 60 | Temp 98.2°F | Resp 17 | Wt 145.0 lb

## 2021-09-13 DIAGNOSIS — C50911 Malignant neoplasm of unspecified site of right female breast: Secondary | ICD-10-CM | POA: Insufficient documentation

## 2021-09-13 DIAGNOSIS — C50811 Malignant neoplasm of overlapping sites of right female breast: Secondary | ICD-10-CM

## 2021-09-13 DIAGNOSIS — Z170 Estrogen receptor positive status [ER+]: Secondary | ICD-10-CM | POA: Insufficient documentation

## 2021-09-13 DIAGNOSIS — C50611 Malignant neoplasm of axillary tail of right female breast: Secondary | ICD-10-CM | POA: Diagnosis not present

## 2021-09-13 DIAGNOSIS — K1231 Oral mucositis (ulcerative) due to antineoplastic therapy: Secondary | ICD-10-CM | POA: Diagnosis not present

## 2021-09-13 DIAGNOSIS — Z5111 Encounter for antineoplastic chemotherapy: Secondary | ICD-10-CM | POA: Diagnosis present

## 2021-09-13 DIAGNOSIS — Z171 Estrogen receptor negative status [ER-]: Secondary | ICD-10-CM

## 2021-09-13 DIAGNOSIS — M2550 Pain in unspecified joint: Secondary | ICD-10-CM | POA: Insufficient documentation

## 2021-09-13 DIAGNOSIS — R197 Diarrhea, unspecified: Secondary | ICD-10-CM | POA: Insufficient documentation

## 2021-09-13 DIAGNOSIS — T451X5A Adverse effect of antineoplastic and immunosuppressive drugs, initial encounter: Secondary | ICD-10-CM | POA: Diagnosis not present

## 2021-09-13 DIAGNOSIS — Z9011 Acquired absence of right breast and nipple: Secondary | ICD-10-CM | POA: Diagnosis not present

## 2021-09-13 DIAGNOSIS — R110 Nausea: Secondary | ICD-10-CM | POA: Insufficient documentation

## 2021-09-13 DIAGNOSIS — C50011 Malignant neoplasm of nipple and areola, right female breast: Secondary | ICD-10-CM

## 2021-09-13 DIAGNOSIS — Z853 Personal history of malignant neoplasm of breast: Secondary | ICD-10-CM | POA: Insufficient documentation

## 2021-09-13 DIAGNOSIS — Z5189 Encounter for other specified aftercare: Secondary | ICD-10-CM | POA: Insufficient documentation

## 2021-09-13 LAB — CMP (CANCER CENTER ONLY)
ALT: 19 U/L (ref 0–44)
AST: 14 U/L — ABNORMAL LOW (ref 15–41)
Albumin: 4.5 g/dL (ref 3.5–5.0)
Alkaline Phosphatase: 81 U/L (ref 38–126)
Anion gap: 10 (ref 5–15)
BUN: 28 mg/dL — ABNORMAL HIGH (ref 6–20)
CO2: 23 mmol/L (ref 22–32)
Calcium: 9.7 mg/dL (ref 8.9–10.3)
Chloride: 106 mmol/L (ref 98–111)
Creatinine: 0.82 mg/dL (ref 0.44–1.00)
GFR, Estimated: >60 mL/min (ref >60)
Glucose, Bld: 210 mg/dL — ABNORMAL HIGH (ref 70–99)
Potassium: 3.8 mmol/L (ref 3.5–5.1)
Sodium: 139 mmol/L (ref 135–145)
Total Bilirubin: 0.3 mg/dL (ref 0.3–1.2)
Total Protein: 6.3 g/dL — ABNORMAL LOW (ref 6.5–8.1)

## 2021-09-13 LAB — CBC WITH DIFFERENTIAL (CANCER CENTER ONLY)
Abs Immature Granulocytes: 0.1 10*3/uL — ABNORMAL HIGH (ref 0.00–0.07)
Basophils Absolute: 0 10*3/uL (ref 0.0–0.1)
Basophils Relative: 0 %
Eosinophils Absolute: 0 10*3/uL (ref 0.0–0.5)
Eosinophils Relative: 0 %
HCT: 39.2 % (ref 36.0–46.0)
Hemoglobin: 13 g/dL (ref 12.0–15.0)
Immature Granulocytes: 1 %
Lymphocytes Relative: 13 %
Lymphs Abs: 2.4 10*3/uL (ref 0.7–4.0)
MCH: 30.7 pg (ref 26.0–34.0)
MCHC: 33.2 g/dL (ref 30.0–36.0)
MCV: 92.5 fL (ref 80.0–100.0)
Monocytes Absolute: 0.5 10*3/uL (ref 0.1–1.0)
Monocytes Relative: 3 %
Neutro Abs: 14.6 10*3/uL — ABNORMAL HIGH (ref 1.7–7.7)
Neutrophils Relative %: 83 %
Platelet Count: 328 10*3/uL (ref 150–400)
RBC: 4.24 MIL/uL (ref 3.87–5.11)
RDW: 14.4 % (ref 11.5–15.5)
WBC Count: 17.6 10*3/uL — ABNORMAL HIGH (ref 4.0–10.5)
nRBC: 0 % (ref 0.0–0.2)

## 2021-09-13 LAB — LACTATE DEHYDROGENASE: LDH: 185 U/L (ref 98–192)

## 2021-09-13 MED ORDER — SODIUM CHLORIDE 0.9% FLUSH
10.0000 mL | INTRAVENOUS | Status: DC | PRN
  Administered 2021-09-13: 10 mL

## 2021-09-13 MED ORDER — SODIUM CHLORIDE 0.9 % IV SOLN
600.0000 mg/m2 | Freq: Once | INTRAVENOUS | Status: AC
  Administered 2021-09-13: 1000 mg via INTRAVENOUS
  Filled 2021-09-13: qty 50

## 2021-09-13 MED ORDER — SODIUM CHLORIDE 0.9 % IV SOLN
67.5000 mg/m2 | Freq: Once | INTRAVENOUS | Status: AC
  Administered 2021-09-13: 110 mg via INTRAVENOUS
  Filled 2021-09-13: qty 11

## 2021-09-13 MED ORDER — PALONOSETRON HCL INJECTION 0.25 MG/5ML
0.2500 mg | Freq: Once | INTRAVENOUS | Status: AC
  Administered 2021-09-13: 0.25 mg via INTRAVENOUS
  Filled 2021-09-13: qty 5

## 2021-09-13 MED ORDER — HEPARIN SOD (PORK) LOCK FLUSH 100 UNIT/ML IV SOLN
500.0000 [IU] | Freq: Once | INTRAVENOUS | Status: AC | PRN
  Administered 2021-09-13: 500 [IU]

## 2021-09-13 MED ORDER — SODIUM CHLORIDE 0.9 % IV SOLN
10.0000 mg | Freq: Once | INTRAVENOUS | Status: AC
  Administered 2021-09-13: 10 mg via INTRAVENOUS
  Filled 2021-09-13: qty 10

## 2021-09-13 MED ORDER — SODIUM CHLORIDE 0.9 % IV SOLN: Freq: Once | INTRAVENOUS | Status: AC

## 2021-09-13 MED ORDER — MAGIC MOUTHWASH: 5.0000 mL | Freq: Four times a day (QID) | ORAL | 0 refills | Status: DC | PRN

## 2021-09-13 NOTE — Patient Instructions (Signed)
Docetaxel Injection What is this medication? DOCETAXEL (doe se TAX el) treats some types of cancer. It works by slowing down the growth of cancer cells. This medicine may be used for other purposes; ask your health care provider or pharmacist if you have questions. COMMON BRAND NAME(S): Docefrez, Taxotere What should I tell my care team before I take this medication? They need to know if you have any of these conditions: Kidney disease Liver disease Low white blood cell levels Tingling of the fingers or toes or other nerve disorder An unusual or allergic reaction to docetaxel, polysorbate 80, other medications, foods, dyes, or preservatives Pregnant or trying to get pregnant Breast-feeding How should I use this medication? This medication is injected into a vein. It is given by your care team in a hospital or clinic setting. Talk to your care team about the use of this medication in children. Special care may be needed. Overdosage: If you think you have taken too much of this medicine contact a poison control center or emergency room at once. NOTE: This medicine is only for you. Do not share this medicine with others. What if I miss a dose? Keep appointments for follow-up doses. It is important not to miss your dose. Call your care team if you are unable to keep an appointment. What may interact with this medication? Do not take this medication with any of the following: Live virus vaccines This medication may also interact with the following: Certain antibiotics, such as clarithromycin, telithromycin Certain antivirals for HIV or hepatitis Certain medications for fungal infections, such as itraconazole, ketoconazole, voriconazole Grapefruit juice Nefazodone Supplements, such as St. John's wort This list may not describe all possible interactions. Give your health care provider a list of all the medicines, herbs, non-prescription drugs, or dietary supplements you use. Also tell  them if you smoke, drink alcohol, or use illegal drugs. Some items may interact with your medicine. What should I watch for while using this medication? This medication may make you feel generally unwell. This is not uncommon as chemotherapy can affect healthy cells as well as cancer cells. Report any side effects. Continue your course of treatment even though you feel ill unless your care team tells you to stop. You may need blood work done while you are taking this medication. This medication can cause serious side effects and infusion reactions. To reduce the risk, your care team may give you other medications to take before receiving this one. Be sure to follow the directions from your care team. This medication may increase your risk of getting an infection. Call your care team for advice if you get a fever, chills, sore throat, or other symptoms of a cold or flu. Do not treat yourself. Try to avoid being around people who are sick. Avoid taking medications that contain aspirin, acetaminophen, ibuprofen, naproxen, or ketoprofen unless instructed by your care team. These medications may hide a fever. Be careful brushing or flossing your teeth or using a toothpick because you may get an infection or bleed more easily. If you have any dental work done, tell your dentist you are receiving this medication. Some products may contain alcohol. Ask your care team if this medication contains alcohol. Be sure to tell all care teams you are taking this medicine. Certain medications, like metronidazole and disulfiram, can cause an unpleasant reaction when taken with alcohol. The reaction includes flushing, headache, nausea, vomiting, sweating, and increased thirst. The reaction can last from 30 minutes to  several hours. This medication may affect your coordination, reaction time, or judgement. Do not drive or operate machinery until you know how this medication affects you. Sit up or stand slowly to reduce the risk  of dizzy or fainting spells. Drinking alcohol with this medication can increase the risk of these side effects. Talk to your care team about your risk of cancer. You may be more at risk for certain types of cancer if you take this medication. Talk to your care team if you wish to become pregnant or think you might be pregnant. This medication can cause serious birth defects if taken during pregnancy or if you get pregnant within 2 months after stopping therapy. A negative pregnancy test is required before starting this medication. A reliable form of contraception is recommended while taking this medication and for 2 months after stopping it. Talk to your care team about reliable forms of contraception. Do not breast-feed while taking this medication and for 1 week after stopping therapy. Use a condom during sex and for 4 months after stopping therapy. Tell your care team right away if you think your partner might be pregnant. This medication can cause serious birth defects. This medication may cause infertility. Talk to your care team if you are concerned about your fertility. What side effects may I notice from receiving this medication? Side effects that you should report to your care team as soon as possible: Allergic reactions--skin rash, itching, hives, swelling of the face, lips, tongue, or throat Change in vision such as blurry vision, seeing halos around lights, vision loss Infection--fever, chills, cough, or sore throat Infusion reactions--chest pain, shortness of breath or trouble breathing, feeling faint or lightheaded Low red blood cell level--unusual weakness or fatigue, dizziness, headache, trouble breathing Pain, tingling, or numbness in the hands or feet Painful swelling, warmth, or redness of the skin, blisters or sores at the infusion site Redness, blistering, peeling, or loosening of the skin, including inside the mouth Sudden or severe stomach pain, bloody diarrhea, fever, nausea,  vomiting Swelling of the ankles, hands, or feet Tumor lysis syndrome (TLS)--nausea, vomiting, diarrhea, decrease in the amount of urine, dark urine, unusual weakness or fatigue, confusion, muscle pain or cramps, fast or irregular heartbeat, joint pain Unusual bruising or bleeding Side effects that usually do not require medical attention (report to your care team if they continue or are bothersome): Change in nail shape, thickness, or color Change in taste Hair loss Increased tears This list may not describe all possible side effects. Call your doctor for medical advice about side effects. You may report side effects to FDA at 1-800-FDA-1088. Where should I keep my medication? This medication is given in a hospital or clinic. It will not be stored at home. NOTE: This sheet is a summary. It may not cover all possible information. If you have questions about this medicine, talk to your doctor, pharmacist, or health care provider.  2023 Elsevier/Gold Standard (2021-03-29 00:00:00) Cyclophosphamide Injection What is this medication? CYCLOPHOSPHAMIDE (sye kloe FOSS fa mide) treats some types of cancer. It works by slowing down the growth of cancer cells. This medicine may be used for other purposes; ask your health care provider or pharmacist if you have questions. COMMON BRAND NAME(S): Cyclophosphamide, Cytoxan, Neosar What should I tell my care team before I take this medication? They need to know if you have any of these conditions: Heart disease Irregular heartbeat or rhythm Infection Kidney problems Liver disease Low blood cell levels (white cells, platelets,  or red blood cells) Lung disease Previous radiation Trouble passing urine An unusual or allergic reaction to cyclophosphamide, other medications, foods, dyes, or preservatives Pregnant or trying to get pregnant Breast-feeding How should I use this medication? This medication is injected into a vein. It is given by your care  team in a hospital or clinic setting. Talk to your care team about the use of this medication in children. Special care may be needed. Overdosage: If you think you have taken too much of this medicine contact a poison control center or emergency room at once. NOTE: This medicine is only for you. Do not share this medicine with others. What if I miss a dose? Keep appointments for follow-up doses. It is important not to miss your dose. Call your care team if you are unable to keep an appointment. What may interact with this medication? Amphotericin B Amiodarone Azathioprine Certain antivirals for HIV or hepatitis Certain medications for blood pressure, such as enalapril, lisinopril, quinapril Cyclosporine Diuretics Etanercept Indomethacin Medications that relax muscles Metronidazole Natalizumab Tamoxifen Warfarin This list may not describe all possible interactions. Give your health care provider a list of all the medicines, herbs, non-prescription drugs, or dietary supplements you use. Also tell them if you smoke, drink alcohol, or use illegal drugs. Some items may interact with your medicine. What should I watch for while using this medication? This medication may make you feel generally unwell. This is not uncommon as chemotherapy can affect healthy cells as well as cancer cells. Report any side effects. Continue your course of treatment even though you feel ill unless your care team tells you to stop. You may need blood work while you are taking this medication. This medication may increase your risk of getting an infection. Call your care team for advice if you get a fever, chills, sore throat, or other symptoms of a cold or flu. Do not treat yourself. Try to avoid being around people who are sick. Avoid taking medications that contain aspirin, acetaminophen, ibuprofen, naproxen, or ketoprofen unless instructed by your care team. These medications may hide a fever. Be careful brushing  or flossing your teeth or using a toothpick because you may get an infection or bleed more easily. If you have any dental work done, tell your dentist you are receiving this medication. Drink water or other fluids as directed. Urinate often, even at night. Some products may contain alcohol. Ask your care team if this medication contains alcohol. Be sure to tell all care teams you are taking this medicine. Certain medicines, like metronidazole and disulfiram, can cause an unpleasant reaction when taken with alcohol. The reaction includes flushing, headache, nausea, vomiting, sweating, and increased thirst. The reaction can last from 30 minutes to several hours. Talk to your care team if you wish to become pregnant or think you might be pregnant. This medication can cause serious birth defects if taken during pregnancy and for 1 year after the last dose. A negative pregnancy test is required before starting this medication. A reliable form of contraception is recommended while taking this medication and for 1 year after the last dose. Talk to your care team about reliable forms of contraception. Do not father a child while taking this medication and for 4 months after the last dose. Use a condom during this time period. Do not breast-feed while taking this medication or for 1 week after the last dose. This medication may cause infertility. Talk to your care team if you are concerned about  your fertility. Talk to your care team about your risk of cancer. You may be more at risk for certain types of cancer if you take this medication. What side effects may I notice from receiving this medication? Side effects that you should report to your care team as soon as possible: Allergic reactions--skin rash, itching, hives, swelling of the face, lips, tongue, or throat Dry cough, shortness of breath or trouble breathing Heart failure--shortness of breath, swelling of the ankles, feet, or hands, sudden weight gain,  unusual weakness or fatigue Heart muscle inflammation--unusual weakness or fatigue, shortness of breath, chest pain, fast or irregular heartbeat, dizziness, swelling of the ankles, feet, or hands Heart rhythm changes--fast or irregular heartbeat, dizziness, feeling faint or lightheaded, chest pain, trouble breathing Infection--fever, chills, cough, sore throat, wounds that don't heal, pain or trouble when passing urine, general feeling of discomfort or being unwell Kidney injury--decrease in the amount of urine, swelling of the ankles, hands, or feet Liver injury--right upper belly pain, loss of appetite, nausea, light-colored stool, dark yellow or brown urine, yellowing skin or eyes, unusual weakness or fatigue Low red blood cell level--unusual weakness or fatigue, dizziness, headache, trouble breathing Low sodium level--muscle weakness, fatigue, dizziness, headache, confusion Red or dark brown urine Unusual bruising or bleeding Side effects that usually do not require medical attention (report to your care team if they continue or are bothersome): Hair loss Irregular menstrual cycles or spotting Loss of appetite Nausea Pain, redness, or swelling with sores inside the mouth or throat Vomiting This list may not describe all possible side effects. Call your doctor for medical advice about side effects. You may report side effects to FDA at 1-800-FDA-1088. Where should I keep my medication? This medication is given in a hospital or clinic. It will not be stored at home. NOTE: This sheet is a summary. It may not cover all possible information. If you have questions about this medicine, talk to your doctor, pharmacist, or health care provider.  2023 Elsevier/Gold Standard (2021-05-08 00:00:00)

## 2021-09-13 NOTE — Progress Notes (Signed)
Labs reviewed by MD, ok to treat despite counts. 

## 2021-09-13 NOTE — Progress Notes (Signed)
Hematology and Oncology Follow Up Visit  Rachel [name] [facility] [id number] 56 y.o. 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 is back for followup.  Unfortunately, she broke the fourth and fifth toes of her right foot.  This happened recently.  She is not in a cast.  She has a walking splint on right now.  Thankfully, no skin was broken through.  I am sure that this will heal up.  She tolerated the first cycle of chemotherapy pretty well.  The main problem is that she had a lot of arthralgias from the Neulasta.  I told her to try Tylenol and Advil may be 2 or 3 days before she had the onset last cycle.  Hopefully this will help minimize the arthralgias.  She is taking Claritin.  She did have a little bit of nausea.  Weight is holding pretty steady.  She had a little bit of mucositis.  She is using some rinses for this.  We may want to try some Magic mouthwash.   She has lost her hair.  I hate that part for her.  She has had no problems with bowels or bladder.  She has had no rashes.  There has been no edema in her legs.  She has had no headache.  Overall, I would say her performance status is probably ECOG 1.    Medications:  Current Outpatient Medications:    acetaminophen (TYLENOL) 500 MG tablet, Take 1,000 mg by mouth every 6 (six) hours as needed for moderate pain or mild pain., Disp: , Rfl:    butalbital-acetaminophen-caffeine (FIORICET, ESGIC) 50-325-40 MG per tablet, TAKE 1 TABLET BY MOUTH EVERY 6 HOURS AS NEEDED FOR HEADACHE, Disp: 20 tablet, Rfl: 0   cefdinir (OMNICEF) 300 MG capsule, Take 2 capsules (600 mg total) by mouth daily., Disp: 20 capsule, Rfl: 0   Cholecalciferol (VITAMIN D PO), Take 1,000 Units by mouth daily.,  Disp: , Rfl:    dexamethasone (DECADRON) 4 MG tablet, Take 2 tablets (8 mg total) by mouth 2 (two) times daily. Start the dexamethasone 2 days before each chemotherapy cycle.  Take for 5 days total each cycle., Disp: 60 tablet, Rfl: 1   fluticasone (FLONASE) 50 MCG/ACT nasal spray, INHALE 2 SPRASY IN EACH NOSTRIL ONCE DAILY (Patient taking differently: Place 2 sprays into both nostrils daily as needed for allergies or rhinitis.), Disp: 16 g, Rfl: 11   gabapentin (NEURONTIN) 100 MG capsule, Take 1 capsule (100 mg total) by mouth 2 (two) times daily. (Patient taking differently: Take 100 mg by mouth daily as needed (Nerve pain).), Disp: 30 capsule, Rfl: 1   ibuprofen (ADVIL) 200 MG tablet, Take 400-600 mg by mouth every 6 (six) hours as needed for mild pain or moderate pain., Disp: , Rfl:    lidocaine-prilocaine (EMLA) cream, Apply to affected area once, Disp: 30 g, Rfl: 3   methocarbamol (ROBAXIN) 500 MG tablet, Take 1 tablet (500 mg total) by mouth every 6 (six) hours as needed for muscle spasms., Disp: 20 tablet, Rfl: 1   ondansetron (ZOFRAN) 8 MG tablet, Take 1 tablet (8 mg total) by mouth 2 (two) times daily as needed for refractory nausea / vomiting. Start on day 3 after chemo., Disp: 30  tablet, Rfl: 1   oxyCODONE (OXY IR/ROXICODONE) 5 MG immediate release tablet, Take 1 tablet (5 mg total) by mouth once as needed (for pain score of 1-4)., Disp: 30 tablet, Rfl: 0   pantoprazole (PROTONIX) 40 MG tablet, TAKE 1 TABLET BY MOUTH EVERY DAY (Patient taking differently: Take 40 mg by mouth daily as needed (Heartburn).), Disp: 90 tablet, Rfl: 3   prochlorperazine (COMPAZINE) 10 MG tablet, Take 1 tablet (10 mg total) by mouth every 6 (six) hours as needed (Nausea or vomiting)., Disp: 30 tablet, Rfl: 1   promethazine (PHENERGAN) 25 MG tablet, TAKE 1 TABLET BY MOUTH EVERY 8 HOURS AS NEEDED FOR NAUSEA/VOMITING, Disp: 20 tablet, Rfl: 0   rizatriptan (MAXALT) 10 MG tablet, TAKE 1 TABLET BY MOUTH EVERY DAY AS  NEEDED FOR MIGRANE, Disp: 6 tablet, Rfl: 5   UNITHROID 112 MCG tablet, TAKE 1 TABLET BY MOUTH EVERY DAY BEFORE BREAKFAST, Disp: 90 tablet, Rfl: 1  Allergies:  Allergies  Allergen Reactions   Anesthetics, Ester Nausea And Vomiting   Erythromycin Nausea And Vomiting    Past Medical History, Surgical history, Social history, and Family History were reviewed and updated.  Review of Systems: Review of Systems  Constitutional: Negative.   HENT:  Positive for congestion.   Eyes: Negative.   Respiratory: Negative.    Cardiovascular: Negative.   Gastrointestinal: Negative.   Genitourinary: Negative.   Musculoskeletal: Negative.   Skin: Negative.   Neurological: Negative.   Endo/Heme/Allergies: Negative.   Psychiatric/Behavioral: Negative.       Physical Exam:  weight is 145 lb (65.8 kg). Her oral temperature is 98.2 F (36.8 C). Her blood pressure is 125/55 (abnormal) and her pulse is 60. Her respiration is 17 and oxygen saturation is 100%.   Physical Exam Vitals reviewed.  Constitutional:      Comments: Her breast exam shows right mastectomy.  This is well-healed.  There is no nodularity.  There is a little bit of numbness along the mastectomy site.  There is no right axillary adenopathy.  Left breast is unremarkable.  There is no mass in the left breast.  There is no left axillary adenopathy.    HENT:     Head: Normocephalic and atraumatic.  Eyes:     Pupils: Pupils are equal, round, and reactive to light.  Cardiovascular:     Rate and Rhythm: Normal rate and regular rhythm.     Heart sounds: Normal heart sounds.  Pulmonary:     Effort: Pulmonary effort is normal.     Breath sounds: Normal breath sounds.  Abdominal:     General: Bowel sounds are normal.     Palpations: Abdomen is soft.  Musculoskeletal:        General: No tenderness or deformity. Normal range of motion.     Cervical back: Normal range of motion.  Lymphadenopathy:     Cervical: No cervical adenopathy.   Skin:    General: Skin is warm and dry.     Findings: No erythema or rash.  Neurological:     Mental Status: She is alert and oriented to person, place, and time.  Psychiatric:        Behavior: Behavior normal.        Thought Content: Thought content normal.        Judgment: Judgment normal.     Lab Results  Component Value Date   WBC 17.6 (H) 09/13/2021   HGB 13.0 09/13/2021   HCT 39.2 09/13/2021   MCV 92.5 09/13/2021  PLT 328 09/13/2021     Chemistry      Component Value Date/Time   NA 139 09/13/2021 0837   NA 140 09/26/2016 0743   NA 143 09/21/2015 0854   K 3.8 09/13/2021 0837   K 3.8 09/26/2016 0743   K 4.4 09/21/2015 0854   CL 106 09/13/2021 0837   CL 104 09/26/2016 0743   CO2 23 09/13/2021 0837   CO2 30 09/26/2016 0743   CO2 28 09/21/2015 0854   BUN 28 (H) 09/13/2021 0837   BUN 14 09/26/2016 0743   BUN 13.9 09/21/2015 0854   CREATININE 0.82 09/13/2021 0837   CREATININE 0.87 01/01/2018 1055   CREATININE 0.8 09/21/2015 0854      Component Value Date/Time   CALCIUM 9.7 09/13/2021 0837   CALCIUM 9.4 09/26/2016 0743   CALCIUM 10.1 09/21/2015 0854   ALKPHOS 81 09/13/2021 0837   ALKPHOS 80 09/26/2016 0743   ALKPHOS 98 09/21/2015 0854   AST 14 (L) 09/13/2021 0837   AST 37 (H) 09/21/2015 0854   ALT 19 09/13/2021 0837   ALT 35 09/26/2016 0743   ALT 57 (H) 09/21/2015 0854   BILITOT 0.3 09/13/2021 0837   BILITOT 0.53 09/21/2015 0854      Impression and Plan: Rachel [name] is a 56 year old white female with a history of stage I ductal carcinoma of the right breast. She was diagnosed 22 years ago. 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 I  think that  she is cured.  Now, she has a second breast cancer.  Again I feel this is not a recurrence in the breast.  I feel this is a second breast cancer.  She will start her second cycle of chemotherapy today.  This is in the adjuvant setting.  Hopefully, she will not have as much arthralgias or myalgias.  We will plan to get her back  to see [place] in another 3 weeks.    Volanda Napoleon, MD 8/4/202310:23 AM

## 2021-09-13 NOTE — Progress Notes (Signed)
Per Dr. Marin Olp pt to receive the taxotere over 1.5 hours.

## 2021-09-16 ENCOUNTER — Inpatient Hospital Stay: Payer: BC Managed Care – PPO

## 2021-09-16 ENCOUNTER — Other Ambulatory Visit: Payer: Self-pay | Admitting: Hematology & Oncology

## 2021-09-16 VITALS — BP 136/73 | HR 60 | Temp 97.7°F | Resp 16

## 2021-09-16 DIAGNOSIS — Z5111 Encounter for antineoplastic chemotherapy: Secondary | ICD-10-CM | POA: Diagnosis not present

## 2021-09-16 DIAGNOSIS — C50811 Malignant neoplasm of overlapping sites of right female breast: Secondary | ICD-10-CM

## 2021-09-16 MED ORDER — PEGFILGRASTIM-CBQV 6 MG/0.6ML ~~LOC~~ SOSY
6.0000 mg | PREFILLED_SYRINGE | Freq: Once | SUBCUTANEOUS | Status: AC
  Administered 2021-09-16: 6 mg via SUBCUTANEOUS
  Filled 2021-09-16: qty 0.6

## 2021-09-16 MED ORDER — LORAZEPAM 0.5 MG PO TABS: 0.5000 mg | ORAL_TABLET | Freq: Four times a day (QID) | ORAL | 0 refills | Status: DC | PRN

## 2021-09-16 NOTE — Patient Instructions (Signed)

## 2021-09-17 ENCOUNTER — Encounter: Payer: Self-pay | Admitting: *Deleted

## 2021-09-17 NOTE — Progress Notes (Signed)
Oncology Nurse Navigator Documentation     09/17/2021   10:00 AM  Oncology Nurse Navigator Flowsheets  Navigator Follow Up Date: 10/04/2021  Navigator Follow Up Reason: Follow-up Appointment;Chemotherapy  Navigator Location [facility]  Navigator Encounter Type Appt/Treatment Plan Review  Patient Visit Type MedOnc  Treatment Phase Active Tx  Barriers/Navigation Needs Coordination of Care;Education  Interventions None Required  Acuity Level 2-Minimal Needs (1-2 Barriers Identified)  Support Groups/Services Friends and Family  Time Spent with Patient 15

## 2021-09-20 ENCOUNTER — Encounter: Payer: Self-pay | Admitting: Hematology & Oncology

## 2021-09-20 ENCOUNTER — Inpatient Hospital Stay: Payer: BC Managed Care – PPO

## 2021-09-20 ENCOUNTER — Telehealth: Payer: Self-pay

## 2021-09-20 ENCOUNTER — Other Ambulatory Visit (HOSPITAL_BASED_OUTPATIENT_CLINIC_OR_DEPARTMENT_OTHER): Payer: Self-pay

## 2021-09-20 ENCOUNTER — Other Ambulatory Visit: Payer: Self-pay | Admitting: *Deleted

## 2021-09-20 ENCOUNTER — Telehealth: Payer: Self-pay | Admitting: *Deleted

## 2021-09-20 VITALS — BP 103/46 | HR 80 | Resp 17

## 2021-09-20 DIAGNOSIS — C50611 Malignant neoplasm of axillary tail of right female breast: Secondary | ICD-10-CM

## 2021-09-20 DIAGNOSIS — C50811 Malignant neoplasm of overlapping sites of right female breast: Secondary | ICD-10-CM

## 2021-09-20 DIAGNOSIS — C50011 Malignant neoplasm of nipple and areola, right female breast: Secondary | ICD-10-CM

## 2021-09-20 DIAGNOSIS — Z5111 Encounter for antineoplastic chemotherapy: Secondary | ICD-10-CM | POA: Diagnosis not present

## 2021-09-20 LAB — CMP (CANCER CENTER ONLY)
ALT: 13 U/L (ref 0–44)
AST: 10 U/L — ABNORMAL LOW (ref 15–41)
Albumin: 3.7 g/dL (ref 3.5–5.0)
Alkaline Phosphatase: 80 U/L (ref 38–126)
Anion gap: 9 (ref 5–15)
BUN: 16 mg/dL (ref 6–20)
CO2: 27 mmol/L (ref 22–32)
Calcium: 9.4 mg/dL (ref 8.9–10.3)
Chloride: 102 mmol/L (ref 98–111)
Creatinine: 0.76 mg/dL (ref 0.44–1.00)
GFR, Estimated: >60 mL/min (ref >60)
Glucose, Bld: 163 mg/dL — ABNORMAL HIGH (ref 70–99)
Potassium: 4 mmol/L (ref 3.5–5.1)
Sodium: 138 mmol/L (ref 135–145)
Total Bilirubin: 0.4 mg/dL (ref 0.3–1.2)
Total Protein: 5.2 g/dL — ABNORMAL LOW (ref 6.5–8.1)

## 2021-09-20 LAB — CBC WITH DIFFERENTIAL (CANCER CENTER ONLY)
Abs Immature Granulocytes: 0.19 10*3/uL — ABNORMAL HIGH (ref 0.00–0.07)
Basophils Absolute: 0 10*3/uL (ref 0.0–0.1)
Basophils Relative: 0 %
Eosinophils Absolute: 0.1 10*3/uL (ref 0.0–0.5)
Eosinophils Relative: 2 %
HCT: 37.3 % (ref 36.0–46.0)
Hemoglobin: 12.6 g/dL (ref 12.0–15.0)
Immature Granulocytes: 3 %
Lymphocytes Relative: 36 %
Lymphs Abs: 2.6 10*3/uL (ref 0.7–4.0)
MCH: 30.6 pg (ref 26.0–34.0)
MCHC: 33.8 g/dL (ref 30.0–36.0)
MCV: 90.5 fL (ref 80.0–100.0)
Monocytes Absolute: 1.7 10*3/uL — ABNORMAL HIGH (ref 0.1–1.0)
Monocytes Relative: 24 %
Neutro Abs: 2.5 10*3/uL (ref 1.7–7.7)
Neutrophils Relative %: 35 %
Platelet Count: 176 10*3/uL (ref 150–400)
RBC: 4.12 MIL/uL (ref 3.87–5.11)
RDW: 13.3 % (ref 11.5–15.5)
Smear Review: NORMAL
WBC Count: 7.2 10*3/uL (ref 4.0–10.5)
nRBC: 0 % (ref 0.0–0.2)

## 2021-09-20 MED ORDER — SODIUM CHLORIDE 0.9 % IV SOLN: INTRAVENOUS | Status: DC

## 2021-09-20 MED ORDER — DIPHENOXYLATE-ATROPINE 2.5-0.025 MG PO TABS
1.0000 | ORAL_TABLET | Freq: Four times a day (QID) | ORAL | 0 refills | Status: DC | PRN
  Filled 2021-09-20: qty 20, 5d supply, fill #0

## 2021-09-20 MED ORDER — LORAZEPAM 0.5 MG PO TABS
0.5000 mg | ORAL_TABLET | Freq: Four times a day (QID) | ORAL | 0 refills | Status: DC | PRN
Start: 2021-09-20 — End: 2021-10-25
  Filled 2021-09-20: qty 30, 8d supply, fill #0

## 2021-09-20 MED ORDER — SODIUM CHLORIDE 0.9% FLUSH
10.0000 mL | INTRAVENOUS | Status: DC | PRN
  Administered 2021-09-20: 10 mL via INTRAVENOUS

## 2021-09-20 MED ORDER — HEPARIN SOD (PORK) LOCK FLUSH 100 UNIT/ML IV SOLN
500.0000 [IU] | Freq: Once | INTRAVENOUS | Status: AC
  Administered 2021-09-20: 500 [IU] via INTRAVENOUS

## 2021-09-20 NOTE — Patient Instructions (Signed)

## 2021-09-20 NOTE — Telephone Encounter (Signed)
Call received from patient stating that she is having difficulty swallowing.  She states that she can swallow two to three bites of food and swallow it without difficulty and then she feels as though her esophagus "spasms" and she can not swallow.  She states that she is able to swallow fluids with no difficulty.  Pt also states that she saw Dr. Loletha Carrow years ago and did have an esophageal stretching.  Dr. Marin Olp notified and would like to speak with Dr. Loletha Carrow.  Call placed to Dr. Loletha Carrow per order of Dr. Marin Olp.

## 2021-09-20 NOTE — Patient Instructions (Signed)
Dehydration, Adult Dehydration is condition in which there is not enough water or other fluids in the body. This happens when a person loses more fluids than he or she takes in. Important body parts cannot work right without the right amount of fluids. Any loss of fluids from the body can cause dehydration. Dehydration can be mild, worse, or very bad. It should be treated right away to keep it from getting very bad. What are the causes? This condition may be caused by: Conditions that cause loss of water or other fluids, such as: Watery poop (diarrhea). Vomiting. Sweating a lot. Peeing (urinating) a lot. Not drinking enough fluids, especially when you: Are ill. Are doing things that take a lot of energy to do. Other illnesses and conditions, such as fever or infection. Certain medicines, such as medicines that take extra fluid out of the body (diuretics). Lack of safe drinking water. Not being able to get enough water and food. What increases the risk? The following factors may make you more likely to develop this condition: Having a long-term (chronic) illness that has not been treated the right way, such as: Diabetes. Heart disease. Kidney disease. Being 65 years of age or older. Having a disability. Living in a place that is high above the ground or sea (high in altitude). The thinner, dried air causes more fluid loss. Doing exercises that put stress on your body for a long time. What are the signs or symptoms? Symptoms of dehydration depend on how bad it is. Mild or worse dehydration Thirst. Dry lips or dry mouth. Feeling dizzy or light-headed, especially when you stand up from sitting. Muscle cramps. Your body making: Dark pee (urine). Pee may be the color of tea. Less pee than normal. Less tears than normal. Headache. Very bad dehydration Changes in skin. Skin may: Be cold to the touch (clammy). Be blotchy or pale. Not go back to normal right after you lightly pinch  it and let it go. Little or no tears, pee, or sweat. Changes in vital signs, such as: Fast breathing. Low blood pressure. Weak pulse. Pulse that is more than 100 beats a minute when you are sitting still. Other changes, such as: Feeling very thirsty. Eyes that look hollow (sunken). Cold hands and feet. Being mixed up (confused). Being very tired (lethargic) or having trouble waking from sleep. Short-term weight loss. Loss of consciousness. How is this treated? Treatment for this condition depends on how bad it is. Treatment should start right away. Do not wait until your condition gets very bad. Very bad dehydration is an emergency. You will need to go to a hospital. Mild or worse dehydration can be treated at home. You may be asked to: Drink more fluids. Drink an oral rehydration solution (ORS). This drink helps get the right amounts of fluids and salts and minerals in the blood (electrolytes). Very bad dehydration can be treated: With fluids through an IV tube. By getting normal levels of salts and minerals in your blood. This is often done by giving salts and minerals through a tube. The tube is passed through your nose and into your stomach. By treating the root cause. Follow these instructions at home: Oral rehydration solution If told by your doctor, drink an ORS: Make an ORS. Use instructions on the package. Start by drinking small amounts, about  cup (120 mL) every 5-10 minutes. Slowly drink more until you have had the amount that your doctor said to have. Eating and drinking          Drink enough clear fluid to keep your pee pale yellow. If you were told to drink an ORS, finish the ORS first. Then, start slowly drinking other clear fluids. Drink fluids such as: Water. Do not drink only water. Doing that can make the salt (sodium) level in your body get too low. Water from ice chips you suck on. Fruit juice that you have added water to (diluted). Low-calorie sports  drinks. Eat foods that have the right amounts of salts and minerals, such as: Bananas. Oranges. Potatoes. Tomatoes. Spinach. Do not drink alcohol. Avoid: Drinks that have a lot of sugar. These include: High-calorie sports drinks. Fruit juice that you did not add water to. Soda. Caffeine. Foods that are greasy or have a lot of fat or sugar. General instructions Take over-the-counter and prescription medicines only as told by your doctor. Do not take salt tablets. Doing that can make the salt level in your body get too high. Return to your normal activities as told by your doctor. Ask your doctor what activities are safe for you. Keep all follow-up visits as told by your doctor. This is important. Contact a doctor if: You have pain in your belly (abdomen) and the pain: Gets worse. Stays in one place. You have a rash. You have a stiff neck. You get angry or annoyed (irritable) more easily than normal. You are more tired or have a harder time waking than normal. You feel: Weak or dizzy. Very thirsty. Get help right away if you have: Any symptoms of very bad dehydration. Symptoms of vomiting, such as: You cannot eat or drink without vomiting. Your vomiting gets worse or does not go away. Your vomit has blood or green stuff in it. Symptoms that get worse with treatment. A fever. A very bad headache. Problems with peeing or pooping (having a bowel movement), such as: Watery poop that gets worse or does not go away. Blood in your poop (stool). This may cause poop to look black and tarry. Not peeing in 6-8 hours. Peeing only a small amount of very dark pee in 6-8 hours. Trouble breathing. These symptoms may be an emergency. Do not wait to see if the symptoms will go away. Get medical help right away. Call your local emergency services (911 in the [place] Do not drive yourself to the hospital. Summary Dehydration is a condition in which there is not enough water or other fluids  in the body. This happens when a person loses more fluids than he or she takes in. Treatment for this condition depends on how bad it is. Treatment should be started right away. Do not wait until your condition gets very bad. Drink enough clear fluid to keep your pee pale yellow. If you were told to drink an oral rehydration solution (ORS), finish the ORS first. Then, start slowly drinking other clear fluids. Take over-the-counter and prescription medicines only as told by your doctor. Get help right away if you have any symptoms of very bad dehydration. This information is not intended to replace advice given to you by your health care provider. Make sure you discuss any questions you have with your health care provider. Document Revised: 06/05/2021 Document Reviewed: 09/09/2018 Elsevier Patient Education  2023 [employer] Inc[employer]  

## 2021-09-20 NOTE — Telephone Encounter (Signed)
I know this patient from her most recent upper endoscopy with me in September 2021.  I spoke to Dr. Marin Olp on the phone today, and he informing this patient is clinically stable on chemotherapy for recurrent localized breast cancer.  Labs from 09/13/2021 with a leukocytosis that Dr. Marin Olp reports is due to her treatment, platelets are normal.  She has recurrent dysphagia.  Rachel [name] should be directly booked for an upper endoscopy with dilation on my schedule in the [facility] in the next few weeks.  -HD

## 2021-09-20 NOTE — Telephone Encounter (Signed)
Rachel [name] has been scheduled for the EGD on 09-13-2021 @ 230pm. She is aware and instructions has been reviewed and sent to her on mychart.

## 2021-09-20 NOTE — Telephone Encounter (Signed)
Dr Antonieta Pert office called about Rachel [name] having increased dysphagia. She has a hx of esophageal dilatation and would like her to have a repeat EGD

## 2021-09-20 NOTE — Telephone Encounter (Signed)
Call placed to patient to check on her status. Pt states that she has had intermittent diarrhea since yesterday which seems to be slowing with taking Imodium and gums are splitting and bleeding which started yesterday.  Dr. Marin Olp notified and would like for pt to come in today for labs and IVF's.  Pt states that she can be here by noon.  Message sent to scheduling.

## 2021-09-23 ENCOUNTER — Ambulatory Visit: Payer: BC Managed Care – PPO

## 2021-09-25 ENCOUNTER — Encounter: Payer: Self-pay | Admitting: Rehabilitation

## 2021-09-25 ENCOUNTER — Ambulatory Visit: Payer: BC Managed Care – PPO | Admitting: Rehabilitation

## 2021-09-25 DIAGNOSIS — Z483 Aftercare following surgery for neoplasm: Secondary | ICD-10-CM

## 2021-09-25 DIAGNOSIS — C50611 Malignant neoplasm of axillary tail of right female breast: Secondary | ICD-10-CM

## 2021-09-25 DIAGNOSIS — M25611 Stiffness of right shoulder, not elsewhere classified: Secondary | ICD-10-CM

## 2021-09-25 DIAGNOSIS — R293 Abnormal posture: Secondary | ICD-10-CM

## 2021-09-25 NOTE — Therapy (Signed)
OUTPATIENT PHYSICAL THERAPY BREAST CANCER TREATMENT NOTE   Patient Name: Rachel [name] MRN: [id number] DOB:Mar 27, 1965, 56 y.o., female Today's Date: 09/25/2021   PT End of Session - 09/25/21 1502     Visit Number 10    Number of Visits 15    Date for PT Re-Evaluation 10/22/21    PT Start Time 1400    PT Stop Time 1455    PT Time Calculation (min) 55 min    Activity Tolerance Patient tolerated treatment well    Behavior During Therapy [facility] for tasks assessed/performed                 Past Medical History:  Diagnosis Date   ASCUS of cervix with negative high risk HPV 03/2018   Bone pain due to G-CSF 08/23/2021   Cancer ([place]) 2001   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  Prior T score -2.5.  DEXA stable from prior study.   Personal history of chemotherapy 2001   Personal history of radiation therapy 2001   Pneumonia    Had 12-2017   PONV (postoperative nausea and vomiting)    Past Surgical History:  Procedure Laterality Date   BREAST EXCISIONAL BIOPSY Left 2021   BREAST LUMPECTOMY Right 2001   CESAREAN SECTION  1991   COLONOSCOPY     MASTECTOMY W/ SENTINEL NODE BIOPSY Right 06/25/2021   Procedure: RIGHT MASTECTOMY WITH SENTINEL LYMPH NODE BIOPSY;  Surgeon: Stark Klein, MD;  Location: [place];  Service: General;  Laterality: Right;   NASAL SINUS SURGERY Right 02/20/2020   Procedure: ENDOSCOPIC SINUS SURGERY WITH RIGHT ETHMOIDECTOMY AND RIGHT MAXILLARY OSTIA ENLARGEMENT WITH FUSION;  Surgeon: Rozetta Nunnery, MD;  Location: [place];  Service: ENT;  Laterality: Right;   PELVIC LAPAROSCOPY     endometriosis   PORTACATH PLACEMENT N/A 08/07/2021    Procedure: PORT PLACEMENT WITH ULTRASOUND GUIDANCE;  Surgeon: Stark Klein, MD;  Location: [facility];  Service: General;  Laterality: N/A;   SINUS ENDO WITH FUSION Right 02/20/2020   Procedure: SINUS ENDO WITH FUSION;  Surgeon: Rozetta Nunnery, MD;  Location: [place];  Service: ENT;  Laterality: Right;   [place]   Patient Active Problem List   Diagnosis Date Noted   Bone pain due to G-CSF 08/23/2021   Cancer of overlapping sites of right female breast ([place]) 06/25/2021   Lateral epicondylitis, left elbow 03/04/2021   Left wrist pain 06/24/2019   Pain in joint of left shoulder 06/24/2019   Pain in joint of left elbow 06/24/2019   Family history of uterine cancer    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 It is still there not painful    PERTINENT HISTORY:  Patient was diagnosed on 06/03/21 with right breast cancer recurrence. Initial lumpectomy, SLNB x 3, radiation, chemotherapy, and antiestrogens. Rt mastectomy on 06/25/21 with Dr. Barry Dienes  with another SLNB 0/3 positive lymph nodes.  Other hx: chiari malformation   PATIENT GOALS:  Reassess how my recovery is going related to arm function, pain, and swelling.  PAIN:  Are you having pain? No, just tightness in the arm and across the chest  PRECAUTIONS: Recent Surgery, right UE Lymphedema risk, 6 total lymph nodes removed x 2 breast cancers OBJECTIVE:  PATIENT SURVEYS:  QUICK DASH: 80%  OBSERVATIONS:  Very well healed very flat mastectomy incision. No edema or redness  Pectoralis axillary border tight with spasm during Abduction  POSTURE:  Slightly guarded  LYMPHEDEMA ASSESSMENT:   UPPER EXTREMITY AROM/PROM:   A/PROM RIGHT  06/17/2021   07/18/21 07/30/21 08/06/21 09/10/21  Shoulder extension 53    50  Shoulder flexion 170 145 - feeling tight 155 158 - pull in axilla 160 - pull in the axilla  Shoulder abduction 170 114 130 - pectoralis muscle into chest 115 - pull in the pectoralis - some cording now noted  130 - pulling down arm   Shoulder internal rotation        Shoulder external rotation 90    90                          (Blank rows = not tested)   CERVICAL AROM: All within normal limits:    LYMPHEDEMA ASSESSMENTS:    [facility] RIGHT  06/17/2021 6/8/23Rt 6/8/23Lt  10 cm proximal to olecranon process 36.5 36.5   Olecranon process 23.5 23.5   10 cm proximal to ulnar styloid process 20.6 20.6   Just proximal to ulnar styloid process 15.3 15.3   Across hand at thumb web space 18 18   At base of 2nd digit 5.8 5.8   (Blank rows = not tested)  TODAY'S TREATMENT: 09/25/21: Therapeutic Exs:  Pulleys: x2 mins into flex and abduction     Manual Therapy: STM  in supine to Rt chest wall and pectoralis insertion   MFR to Rt axilla during P/ROM P/ROM in supine to Rt shoulder to per tolerance into flexion,abduction with pt able to achieve full ROM Sidelying pinning of axilla and overhead abduction AROM MFR.    09/10/21 Rechecked AROM and cording status Rechecked SOZO which  is still WNL MFR Rt UE following cording, STM along cording from axilla to wrist in various positions of the UE.  08/15/21: Therapeutic Exs:  Pulleys: x2 mins into flex and abduction   Ball roll up wall into flexion x10 and Rt abd x10   Manual Therapy: STM in supine to Rt chest wall and pectoralis insertion Small cording may be palpable and noted to be pulling in abduction but not visible.  MFR to Rt axilla during P/ROM P/ROM in supine to Rt shoulder to per tolerance into flexion,abduction with pt able to achieve full ROM   PATIENT EDUCATION:  Education details: updated HEP, POC Person educated: Patient Education method: Explanation, Demonstration, Tactile cues, and Handouts Education comprehension: verbalized understanding, returned demonstration, verbal cues required, tactile cues required, and needs further education  HOME EXERCISE PROGRAM: Reviewed previously given post op HEP with changes as needed: Access Code: [email or web address] URL: [email or web address] Date: 07/18/2021 Prepared by: Shan Levans  Exercises - Supine Shoulder Flexion AAROM with Hands Clasped - 1-2 x daily - 7 x weekly - 1 sets - 10 reps - 5 second hold - Supine Chest Stretch with Elbows Bent - 1 x daily - 7 x weekly - 1 sets - 3 reps - 30-60seconds hold - Standing Shoulder Abduction AAROM with Dowel - 1-2 x daily - 7 x weekly - 1 sets - 10 reps - 5 second hold - Supine Thoracic Mobilization Towel Roll Vertical with Arm Stretch - 2 x daily - 7 x weekly - 1 sets - 1 reps - 20-30 seconds hold  ASSESSMENT CLINICAL IMPRESSION: Cording is not very palpable except for a bit in the axilla in sidelying abduction as just above in the incision on the Rt side  Pt will benefit from skilled therapeutic intervention to improve on the following deficits: Decreased knowledge of precautions, impaired UE functional use, pain, decreased ROM, postural dysfunction.   PT treatment/interventions: ADL/Self care home management,  Therapeutic exercises, Patient/Family education, and Manual therapy   GOALS: Goals reviewed with patient? Yes  LONG TERM GOALS:  (STG=LTG)  GOALS Name Target Date  Goal status  1 Pt will demonstrate she has regained full shoulder ROM and function post operatively compared to baselines.  Baseline: 08/15/2021 IN PROGRESS  2 Pt will be educated on lymphedema risk reduction- ABC class - and SOZO surveillance 08/15/21 INITIAL  3 Pt will feel ind with continued HEP 08/15/21 INITIAL          PLAN: PT FREQUENCY/DURATION: 1x per week for 6 weeks  PLAN FOR NEXT SESSION: *Schedule out SOZO*  continue MFR and STM to R pec, Begin in person education for ABC class as pt will not be able to make the next one; cont PROM and STM Rt UE, AAROM activities    Stark Bray, PT 09/25/2021, 3:02 PM

## 2021-09-27 ENCOUNTER — Telehealth: Payer: Self-pay | Admitting: *Deleted

## 2021-09-27 ENCOUNTER — Other Ambulatory Visit: Payer: Self-pay | Admitting: *Deleted

## 2021-09-27 DIAGNOSIS — H9202 Otalgia, left ear: Secondary | ICD-10-CM

## 2021-09-27 MED ORDER — AZITHROMYCIN 250 MG PO TABS: ORAL_TABLET | ORAL | 0 refills | Status: DC

## 2021-09-27 NOTE — Telephone Encounter (Signed)
Patient called this morning and stated,"I still have a sore throat but now my left ear hurts. The Magic Mouthwash helps the throat a lot. Is this the beginning of an ear ache? Do I need to be seen?" Per Dr. Marin Olp, call in a Zpak for the ear pain. Patient verbalized understanding.

## 2021-09-30 ENCOUNTER — Inpatient Hospital Stay: Payer: BC Managed Care – PPO

## 2021-09-30 ENCOUNTER — Ambulatory Visit: Payer: BC Managed Care – PPO | Admitting: Hematology & Oncology

## 2021-10-02 ENCOUNTER — Encounter: Payer: Self-pay | Admitting: Rehabilitation

## 2021-10-02 ENCOUNTER — Ambulatory Visit: Payer: BC Managed Care – PPO | Admitting: Rehabilitation

## 2021-10-02 DIAGNOSIS — M25611 Stiffness of right shoulder, not elsewhere classified: Secondary | ICD-10-CM

## 2021-10-02 DIAGNOSIS — Z483 Aftercare following surgery for neoplasm: Secondary | ICD-10-CM

## 2021-10-02 DIAGNOSIS — R293 Abnormal posture: Secondary | ICD-10-CM

## 2021-10-02 DIAGNOSIS — Z171 Estrogen receptor negative status [ER-]: Secondary | ICD-10-CM

## 2021-10-02 NOTE — Therapy (Signed)
OUTPATIENT PHYSICAL THERAPY BREAST CANCER TREATMENT NOTE   Patient Name: Rachel [name] MRN: [id number] DOB:[birth year], 56 y.o., female Today's Date: 10/02/2021   PT End of Session - 10/02/21 1059     Visit Number 11    Number of Visits 15    Date for PT Re-Evaluation 10/22/21    PT Start Time 1100    PT Stop Time 1153    PT Time Calculation (min) 53 min    Activity Tolerance Patient tolerated treatment well    Behavior During Therapy [facility] for tasks assessed/performed                 Past Medical History:  Diagnosis Date   ASCUS of cervix with negative high risk HPV 03/2018   Bone pain due to G-CSF 08/23/2021   Cancer ([place]) 2001   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  Prior T score -2.5.  DEXA stable from prior study.   Personal history of chemotherapy 2001   Personal history of radiation therapy 2001   Pneumonia    Had 12-2017   PONV (postoperative nausea and vomiting)    Past Surgical History:  Procedure Laterality Date   BREAST EXCISIONAL BIOPSY Left 2021   BREAST LUMPECTOMY Right 2001   CESAREAN SECTION  1991   COLONOSCOPY     MASTECTOMY W/ SENTINEL NODE BIOPSY Right 06/25/2021   Procedure: RIGHT MASTECTOMY WITH SENTINEL LYMPH NODE BIOPSY;  Surgeon: Stark Klein, MD;  Location: [place];  Service: General;  Laterality: Right;   NASAL SINUS SURGERY Right 02/20/2020   Procedure: ENDOSCOPIC SINUS SURGERY WITH RIGHT ETHMOIDECTOMY AND RIGHT MAXILLARY OSTIA ENLARGEMENT WITH FUSION;  Surgeon: Rozetta Nunnery, MD;  Location: [place];  Service: ENT;  Laterality: Right;   PELVIC LAPAROSCOPY     endometriosis   PORTACATH PLACEMENT N/A 08/07/2021    Procedure: PORT PLACEMENT WITH ULTRASOUND GUIDANCE;  Surgeon: Stark Klein, MD;  Location: [facility];  Service: General;  Laterality: N/A;   SINUS ENDO WITH FUSION Right 02/20/2020   Procedure: SINUS ENDO WITH FUSION;  Surgeon: Rozetta Nunnery, MD;  Location: [place];  Service: ENT;  Laterality: Right;   [place]   Patient Active Problem List   Diagnosis Date Noted   Bone pain due to G-CSF 08/23/2021   Cancer of overlapping sites of right female breast ([place]) 06/25/2021   Lateral epicondylitis, left elbow 03/04/2021   Left wrist pain 06/24/2019   Pain in joint of left shoulder 06/24/2019   Pain in joint of left elbow 06/24/2019   Family history of uterine cancer    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   PERTINENT HISTORY:  Patient was diagnosed on 06/03/21 with right breast cancer recurrence. Initial lumpectomy, SLNB x 3, radiation, chemotherapy, and antiestrogens. Rt mastectomy on  06/25/21 with Dr. Barry Dienes with another SLNB 0/3 positive lymph nodes.  Other hx: chiari malformation   PATIENT GOALS:  Reassess how my recovery is going related to arm function, pain, and swelling.  PAIN:  Are you having pain? No, just tightness in the arm and across the chest  PRECAUTIONS: Recent Surgery, right UE Lymphedema risk, 6 total lymph nodes removed x 2 breast cancers OBJECTIVE:  PATIENT SURVEYS:  QUICK DASH: 80%  OBSERVATIONS:  Very well healed very flat mastectomy incision. No edema or redness  Pectoralis axillary border tight with spasm during Abduction  POSTURE:  Slightly guarded  LYMPHEDEMA ASSESSMENT:   UPPER EXTREMITY AROM/PROM:   A/PROM RIGHT  06/17/2021   07/18/21 07/30/21 08/06/21 09/10/21  Shoulder extension 53    50  Shoulder flexion 170 145 - feeling tight 155 158 - pull in axilla 160 - pull in the axilla  Shoulder abduction 170 114 130 - pectoralis muscle into chest 115 - pull in the pectoralis - some cording now noted  130 - pulling down arm   Shoulder internal rotation        Shoulder external rotation 90    90                          (Blank rows = not tested)   CERVICAL AROM: All within normal limits:    LYMPHEDEMA ASSESSMENTS:    [facility] RIGHT  06/17/2021 6/8/23Rt 6/8/23Lt  10 cm proximal to olecranon process 36.5 36.5   Olecranon process 23.5 23.5   10 cm proximal to ulnar styloid process 20.6 20.6   Just proximal to ulnar styloid process 15.3 15.3   Across hand at thumb web space 18 18   At base of 2nd digit 5.8 5.8   (Blank rows = not tested)  TODAY'S TREATMENT: 10/02/21: Therapeutic Exs:  Pulleys: x2 mins into flex and abduction  Manual Therapy: STM in supine to Rt chest wall and pectoralis insertion   MFR to Rt axilla during P/ROM P/ROM in supine to Rt shoulder to per tolerance into flexion,abduction with pt able to achieve full ROM  09/25/21: Therapeutic Exs:  Pulleys: x2 mins into flex and abduction     Manual Therapy: STM in  supine to Rt chest wall and pectoralis insertion   MFR to Rt axilla during P/ROM P/ROM in supine to Rt shoulder to per tolerance into flexion,abduction with pt able to achieve full ROM Sidelying pinning of axilla and overhead abduction AROM MFR.    09/10/21 Rechecked AROM and cording status Rechecked SOZO which is still WNL MFR Rt UE following cording, STM along cording from axilla to wrist in various positions of the UE.   PATIENT EDUCATION:  Education details: updated HEP, POC Person educated: Patient Education method: Explanation, Demonstration, Tactile cues, and Handouts Education comprehension: verbalized understanding, returned demonstration, verbal cues required, tactile cues required, and needs further education  HOME EXERCISE PROGRAM: Reviewed previously given post op HEP with changes as needed: Access Code: [email or web address] URL: [email or web address] Date: 07/18/2021 Prepared by: Shan Levans  Exercises - Supine Shoulder Flexion AAROM with Hands Clasped - 1-2 x daily - 7 x weekly - 1 sets - 10 reps - 5 second hold - Supine Chest Stretch with Elbows Bent - 1 x daily - 7 x weekly - 1 sets - 3 reps - 30-60seconds hold - Standing Shoulder Abduction AAROM with Dowel - 1-2 x daily - 7 x weekly - 1 sets - 10 reps - 5 second hold - Supine Thoracic Mobilization Towel Roll Vertical with Arm Stretch - 2 x daily - 7 x weekly - 1 sets - 1 reps - 20-30 seconds hold  ASSESSMENT CLINICAL IMPRESSION: Cording is not palpable today but pt feels less pull overall after MT.    Pt will benefit from skilled therapeutic intervention to improve on the following deficits: Decreased knowledge of precautions, impaired UE functional use, pain, decreased ROM, postural dysfunction.   PT treatment/interventions: ADL/Self care home management, Therapeutic exercises, Patient/Family education, and Manual therapy   GOALS: Goals reviewed with patient? Yes  LONG TERM GOALS:  (STG=LTG)  GOALS Name  Target Date  Goal status  1 Pt will demonstrate she has regained full shoulder ROM and function post operatively compared to baselines.  Baseline: 08/15/2021 IN PROGRESS  2 Pt will be educated on lymphedema risk reduction- ABC class - and SOZO surveillance 08/15/21 INITIAL  3 Pt will feel ind with continued HEP 08/15/21 INITIAL          PLAN: PT FREQUENCY/DURATION: 1x per week for 6 weeks  PLAN FOR NEXT SESSION: continue MFR and STM to R pec, Begin in person education for ABC class as pt will not be able to make the next one; cont PROM and STM Rt UE, AAROM activities    Stark Bray, PT 10/02/2021, 4:47 PM

## 2021-10-03 ENCOUNTER — Ambulatory Visit (AMBULATORY_SURGERY_CENTER): Payer: BC Managed Care – PPO | Admitting: Gastroenterology

## 2021-10-03 ENCOUNTER — Encounter: Payer: Self-pay | Admitting: Gastroenterology

## 2021-10-03 ENCOUNTER — Other Ambulatory Visit: Payer: Self-pay

## 2021-10-03 VITALS — BP 123/58 | HR 70 | Temp 97.3°F | Resp 17 | Ht 64.5 in | Wt 144.4 lb

## 2021-10-03 DIAGNOSIS — R1310 Dysphagia, unspecified: Secondary | ICD-10-CM | POA: Diagnosis not present

## 2021-10-03 DIAGNOSIS — K222 Esophageal obstruction: Secondary | ICD-10-CM

## 2021-10-03 DIAGNOSIS — R1319 Other dysphagia: Secondary | ICD-10-CM | POA: Diagnosis present

## 2021-10-03 DIAGNOSIS — S92511A Displaced fracture of proximal phalanx of right lesser toe(s), initial encounter for closed fracture: Secondary | ICD-10-CM | POA: Insufficient documentation

## 2021-10-03 MED ORDER — SODIUM CHLORIDE 0.9 % IV SOLN: 500.0000 mL | Freq: Once | INTRAVENOUS | Status: DC

## 2021-10-03 NOTE — Progress Notes (Signed)
Called to room to assist during endoscopic procedure.  Patient ID and intended procedure confirmed with present staff. Received instructions for my participation in the procedure from the performing physician.  

## 2021-10-03 NOTE — Patient Instructions (Signed)
Information on strictures given to you today.  Resume previous diet and medications.   YOU HAD AN ENDOSCOPIC PROCEDURE TODAY AT [facility]:   Refer to the procedure report that was given to you for any specific questions about what was found during the examination.  If the procedure report does not answer your questions, please call your gastroenterologist to clarify.  If you requested that your care partner not be given the details of your procedure findings, then the procedure report has been included in a sealed envelope for you to review at your convenience later.  YOU SHOULD EXPECT: Some feelings of bloating in the abdomen. Passage of more gas than usual.  Walking can help get rid of the air that was put into your GI tract during the procedure and reduce the bloating. If you had a lower endoscopy (such as a colonoscopy or flexible sigmoidoscopy) you may notice spotting of blood in your stool or on the toilet paper. If you underwent a bowel prep for your procedure, you may not have a normal bowel movement for a few days.  Please Note:  You might notice some irritation and congestion in your nose or some drainage.  This is from the oxygen used during your procedure.  There is no need for concern and it should clear up in a day or so.  SYMPTOMS TO REPORT IMMEDIATELY:   Following upper endoscopy (EGD)  Vomiting of blood or coffee ground material  New chest pain or pain under the shoulder blades  Painful or persistently difficult swallowing  New shortness of breath  Fever of 100F or higher  Black, tarry-looking stools  For urgent or emergent issues, a gastroenterologist can be reached at any hour by calling [phone]. Do not use MyChart messaging for urgent concerns.    DIET:  We do recommend a small meal at first, but then you may proceed to your regular diet.  Drink plenty of fluids but you should avoid alcoholic beverages for 24 hours.  ACTIVITY:  You should  plan to take it easy for the rest of today and you should NOT DRIVE or use heavy machinery until tomorrow (because of the sedation medicines used during the test).    FOLLOW UP: Our staff will call the number listed on your records the next business day following your procedure.  We will call around 7:15- 8:00 am to check on you and address any questions or concerns that you may have regarding the information given to you following your procedure. If we do not reach you, we will leave a message.  If you develop any symptoms (ie: fever, flu-like symptoms, shortness of breath, cough etc.) before then, please call [phone].  If you test positive for Covid 19 in the 2 weeks post procedure, please call and report this information to [place].    If any biopsies were taken you will be contacted by phone or by letter within the next 1-3 weeks.  Please call us at [phone] if you have not heard about the biopsies in 3 weeks.    SIGNATURES/CONFIDENTIALITY: You and/or your care partner have signed paperwork which will be entered into your electronic medical record.  These signatures attest to the fact that that the information above on your After Visit Summary has been reviewed and is understood.  Full responsibility of the confidentiality of this discharge information lies with you and/or your care-partner.

## 2021-10-03 NOTE — Op Note (Signed)
[facility] Patient Name: Rachel [name] Procedure Date: 10/03/2021 2:16 PM MRN: [id number] Endoscopist: Mallie Mussel L. Loletha Carrow , MD Age: 56 Referring MD:  Date of Birth: 08-05-1965 Gender: Female Account #: [id number] Procedure:                Upper GI endoscopy Indications:              Esophageal dysphagia, Odynophagia                           Began after 1st cycle of CTX for breast CA,                            waxing/waning with treatment cycles. Medicines:                Monitored Anesthesia Care Procedure:                Pre-Anesthesia Assessment:                           - Prior to the procedure, a History and Physical                            was performed, and patient medications and                            allergies were reviewed. The patient's tolerance of                            previous anesthesia was also reviewed. The risks                            and benefits of the procedure and the sedation                            options and risks were discussed with the patient.                            All questions were answered, and informed consent                            was obtained. Prior Anticoagulants: The patient has                            taken no previous anticoagulant or antiplatelet                            agents. ASA Grade Assessment: II - A patient with                            mild systemic disease. After reviewing the risks                            and benefits, the patient was deemed in  satisfactory condition to undergo the procedure.                           After obtaining informed consent, the endoscope was                            passed under direct vision. Throughout the                            procedure, the patient's blood pressure, pulse, and                            oxygen saturations were monitored continuously. The                            Endoscope was introduced through the mouth,  and                            advanced to the second part of duodenum. The upper                            GI endoscopy was accomplished without difficulty.                            The patient tolerated the procedure well. Scope In: Scope Out: Findings:                 A moderate Schatzki ring was found at the                            gastroesophageal junction. The scope was withdrawn                            after complete EGD. Dilation was performed with a                            Maloney dilator with no resistance at 52 Fr. The                            dilation site was examined and showed no change.                            This was then biopsied with a cold forceps for ring                            disruption (no tissue sent for path).                           The exam of the esophagus was otherwise normal.                            Specifically, no candida or ulcers seen.  The stomach was normal.                           The cardia and gastric fundus were normal on                            retroflexion.                           The examined duodenum was normal.                           Hypopharynx not well visualized on this exam. Complications:            No immediate complications. Estimated Blood Loss:     Estimated blood loss was minimal. Impression:               - Moderate Schatzki ring. Dilated. Biopsied.                           - Normal stomach.                           - Normal examined duodenum.                           Patient also reports sore throat and periodic mouth                            sores without thrush during CTX. Recommendation:           - Patient has a contact number available for                            emergencies. The signs and symptoms of potential                            delayed complications were discussed with the                            patient. Return to normal activities tomorrow.                             Written discharge instructions were provided to the                            patient.                           - Resume previous diet.                           - Continue present medications. Rakiya Krawczyk L. Loletha Carrow, MD 10/03/2021 2:53:25 PM This report has been signed electronically.

## 2021-10-03 NOTE — Progress Notes (Signed)
Pt in recovery with monitors in place, VSS. Report given to receiving RN. Bite guard was placed with pt awake to ensure comfort. No dental or soft tissue damage noted. 

## 2021-10-03 NOTE — Progress Notes (Signed)
History and Physical:  This patient presents for endoscopic testing for: Encounter Diagnoses  Name Primary?   Esophageal dysphagia Yes   Odynophagia     Rachel [name] is a 56 year old woman known to me from previous endoscopic evaluations.  She has a history of esophageal dysphagia from a Schatzki ring, and underwent upper endoscopies in 2020 and again in September 2021.  On the first occasion his Schatzki ring was treated with biopsy disruption of the mucosal ring.  On the 2021 exam, 46 French Maloney dilator was used. She contacted our office recently with recurrent dysphagia and scheduled for this procedure.  She has been undergoing treatment for breast cancer as well.  This dysphagia and odynophagia came on soon after starting her first round of chemotherapy about 6 to 8 weeks ago, seem to get a little better, then recurred after the second round of treatment.  Symptoms have been improving about the last 7 days.  She has had some mouth sores and some throat discomfort and recently received azithromycin empirically.  Her oncologist call me recently with the symptoms and said he had not noticed any oral thrush.  Patient is otherwise without complaints or active issues today.   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  Prior T score -2.5.  DEXA stable from prior study.   Personal history of chemotherapy 2001   Personal history of radiation therapy 2001   Pneumonia    Had 12-2017   PONV (postoperative nausea and vomiting)      Past Surgical  History: Past Surgical History:  Procedure Laterality Date   BREAST EXCISIONAL BIOPSY Left 2021   BREAST LUMPECTOMY Right 2001   CESAREAN SECTION  1991   COLONOSCOPY     MASTECTOMY W/ SENTINEL NODE BIOPSY Right 06/25/2021   Procedure: RIGHT MASTECTOMY WITH SENTINEL LYMPH NODE BIOPSY;  Surgeon: Stark Klein, MD;  Location: [place];  Service: General;  Laterality: Right;   NASAL SINUS SURGERY Right 02/20/2020   Procedure: ENDOSCOPIC SINUS SURGERY WITH RIGHT ETHMOIDECTOMY AND RIGHT MAXILLARY OSTIA ENLARGEMENT WITH FUSION;  Surgeon: Rozetta Nunnery, MD;  Location: [place];  Service: ENT;  Laterality: Right;   PELVIC LAPAROSCOPY     endometriosis   PORTACATH PLACEMENT N/A 08/07/2021   Procedure: PORT PLACEMENT WITH ULTRASOUND GUIDANCE;  Surgeon: Stark Klein, MD;  Location[facility];  Service: General;  Laterality: N/A;   SINUS ENDO WITH FUSION Right 02/20/2020   Procedure: SINUS ENDO WITH FUSION;  Surgeon: Rozetta Nunnery, MD;  Location: [place];  Service: ENT;  Laterality: Right;   TONSILLECTOMY  1980   UPPER GI ENDOSCOPY     WISDOM TOOTH EXTRACTION  1986    Allergies: Allergies  Allergen Reactions   Anesthetics, Ester Nausea And Vomiting   Erythromycin Nausea And Vomiting    Outpatient Meds: Current Outpatient Medications  Medication Sig Dispense Refill   azithromycin (ZITHROMAX Z-PAK) 250 MG tablet Take as directed.  6 each 0   Cholecalciferol (VITAMIN D PO) Take 1,000 Units by mouth daily.     dexamethasone (DECADRON) 4 MG tablet Take 2 tablets (8 mg total) by mouth 2 (two) times daily. Start the dexamethasone 2 days before each chemotherapy cycle.  Take for 5 days total each cycle. 60 tablet 1   ibuprofen (ADVIL) 200 MG tablet Take 400-600 mg by mouth every 6 (six) hours as needed for mild pain or moderate pain.     UNITHROID 112 MCG tablet TAKE 1 TABLET BY MOUTH EVERY DAY BEFORE BREAKFAST 90 tablet 1    acetaminophen (TYLENOL) 500 MG tablet Take 1,000 mg by mouth every 6 (six) hours as needed for moderate pain or mild pain.     butalbital-acetaminophen-caffeine (FIORICET, ESGIC) 50-325-40 MG per tablet TAKE 1 TABLET BY MOUTH EVERY 6 HOURS AS NEEDED FOR HEADACHE 20 tablet 0   cefdinir (OMNICEF) 300 MG capsule Take 2 capsules (600 mg total) by mouth daily. 20 capsule 0   diphenoxylate-atropine (LOMOTIL) 2.5-0.025 MG tablet Take 1 tablet by mouth 4 (four) times daily as needed for diarrhea or loose stools. 30 tablet 0   fluticasone (FLONASE) 50 MCG/ACT nasal spray INHALE 2 SPRASY IN EACH NOSTRIL ONCE DAILY (Patient taking differently: Place 2 sprays into both nostrils daily as needed for allergies or rhinitis.) 16 g 11   gabapentin (NEURONTIN) 100 MG capsule Take 1 capsule (100 mg total) by mouth 2 (two) times daily. (Patient taking differently: Take 100 mg by mouth daily as needed (Nerve pain).) 30 capsule 1   lidocaine-prilocaine (EMLA) cream Apply to affected area once 30 g 3   LORazepam (ATIVAN) 0.5 MG tablet Take 1 tablet (0.5 mg total) by mouth every 6 (six) hours as needed for anxiety (nausea). 30 tablet 0   magic mouthwash SOLN Take 5 mLs by mouth 4 (four) times daily as needed for mouth pain. 240 mL 0   methocarbamol (ROBAXIN) 500 MG tablet Take 1 tablet (500 mg total) by mouth every 6 (six) hours as needed for muscle spasms. 20 tablet 1   ondansetron (ZOFRAN) 8 MG tablet Take 1 tablet (8 mg total) by mouth 2 (two) times daily as needed for refractory nausea / vomiting. Start on day 3 after chemo. 30 tablet 1   oxyCODONE (OXY IR/ROXICODONE) 5 MG immediate release tablet Take 1 tablet (5 mg total) by mouth once as needed (for pain score of 1-4). 30 tablet 0   pantoprazole (PROTONIX) 40 MG tablet TAKE 1 TABLET BY MOUTH EVERY DAY (Patient taking differently: Take 40 mg by mouth daily as needed (Heartburn).) 90 tablet 3   prochlorperazine (COMPAZINE) 10 MG tablet Take 1 tablet (10 mg total) by mouth  every 6 (six) hours as needed (Nausea or vomiting). 30 tablet 1   promethazine (PHENERGAN) 25 MG tablet TAKE 1 TABLET BY MOUTH EVERY 8 HOURS AS NEEDED FOR NAUSEA/VOMITING 20 tablet 0   rizatriptan (MAXALT) 10 MG tablet TAKE 1 TABLET BY MOUTH EVERY DAY AS NEEDED FOR MIGRANE 6 tablet 5   Current Facility-Administered Medications  Medication Dose Route Frequency Provider Last Rate Last Admin   0.9 %  sodium chloride infusion  500 mL Intravenous Once Nelida Meuse III, MD          ___________________________________________________________________ Objective   Exam:  BP (!) 160/88   Pulse 65   Temp (!) 97.3 F (36.3 C)   Resp 13   Ht 5' 4.5" (1.638 m)   Wt 144 lb 6.4 oz (  65.5 kg)   LMP  (LMP Unknown)   SpO2 100%   BMI 24.40 kg/m   CV: RRR without murmur, S1/S2 Resp: clear to auscultation bilaterally, normal RR and effort noted GI: soft, no tenderness, with active bowel sounds.   Assessment: Encounter Diagnoses  Name Primary?   Esophageal dysphagia Yes   Odynophagia    History of Schatzki ring  Plan:  EGD with possible dilation  The benefits and risks of the planned procedure were described in detail with the patient or (when appropriate) their health care proxy.  Risks were outlined as including, but not limited to, bleeding, infection, perforation, adverse medication reaction leading to cardiac or pulmonary decompensation, pancreatitis (if ERCP).  The limitation of incomplete mucosal visualization was also discussed.  No guarantees or warranties were given.   The patient is appropriate for an endoscopic procedure in the ambulatory setting.   - Wilfrid Lund, MD

## 2021-10-04 ENCOUNTER — Inpatient Hospital Stay: Payer: BC Managed Care – PPO

## 2021-10-04 ENCOUNTER — Telehealth: Payer: Self-pay

## 2021-10-04 ENCOUNTER — Inpatient Hospital Stay (HOSPITAL_BASED_OUTPATIENT_CLINIC_OR_DEPARTMENT_OTHER): Payer: BC Managed Care – PPO | Admitting: Hematology & Oncology

## 2021-10-04 ENCOUNTER — Encounter: Payer: Self-pay | Admitting: *Deleted

## 2021-10-04 ENCOUNTER — Other Ambulatory Visit: Payer: Self-pay

## 2021-10-04 ENCOUNTER — Encounter: Payer: Self-pay | Admitting: Hematology & Oncology

## 2021-10-04 VITALS — BP 107/55 | HR 56 | Temp 97.9°F | Resp 18 | Ht 64.5 in | Wt 142.0 lb

## 2021-10-04 DIAGNOSIS — Z171 Estrogen receptor negative status [ER-]: Secondary | ICD-10-CM

## 2021-10-04 DIAGNOSIS — Z5111 Encounter for antineoplastic chemotherapy: Secondary | ICD-10-CM | POA: Diagnosis not present

## 2021-10-04 DIAGNOSIS — C50611 Malignant neoplasm of axillary tail of right female breast: Secondary | ICD-10-CM | POA: Diagnosis not present

## 2021-10-04 DIAGNOSIS — C50811 Malignant neoplasm of overlapping sites of right female breast: Secondary | ICD-10-CM

## 2021-10-04 LAB — CBC WITH DIFFERENTIAL (CANCER CENTER ONLY)
Abs Immature Granulocytes: 0.11 10*3/uL — ABNORMAL HIGH (ref 0.00–0.07)
Basophils Absolute: 0 10*3/uL (ref 0.0–0.1)
Basophils Relative: 0 %
Eosinophils Absolute: 0 10*3/uL (ref 0.0–0.5)
Eosinophils Relative: 0 %
HCT: 35.9 % — ABNORMAL LOW (ref 36.0–46.0)
Hemoglobin: 11.8 g/dL — ABNORMAL LOW (ref 12.0–15.0)
Immature Granulocytes: 1 %
Lymphocytes Relative: 20 %
Lymphs Abs: 2.6 10*3/uL (ref 0.7–4.0)
MCH: 30.9 pg (ref 26.0–34.0)
MCHC: 32.9 g/dL (ref 30.0–36.0)
MCV: 94 fL (ref 80.0–100.0)
Monocytes Absolute: 0.4 10*3/uL (ref 0.1–1.0)
Monocytes Relative: 4 %
Neutro Abs: 9.6 10*3/uL — ABNORMAL HIGH (ref 1.7–7.7)
Neutrophils Relative %: 75 %
Platelet Count: 397 10*3/uL (ref 150–400)
RBC: 3.82 MIL/uL — ABNORMAL LOW (ref 3.87–5.11)
RDW: 14.6 % (ref 11.5–15.5)
WBC Count: 12.7 10*3/uL — ABNORMAL HIGH (ref 4.0–10.5)
nRBC: 0 % (ref 0.0–0.2)

## 2021-10-04 LAB — CMP (CANCER CENTER ONLY)
ALT: 15 U/L (ref 0–44)
AST: 11 U/L — ABNORMAL LOW (ref 15–41)
Albumin: 4.1 g/dL (ref 3.5–5.0)
Alkaline Phosphatase: 81 U/L (ref 38–126)
Anion gap: 9 (ref 5–15)
BUN: 19 mg/dL (ref 6–20)
CO2: 25 mmol/L (ref 22–32)
Calcium: 9.4 mg/dL (ref 8.9–10.3)
Chloride: 106 mmol/L (ref 98–111)
Creatinine: 0.73 mg/dL (ref 0.44–1.00)
GFR, Estimated: >60 mL/min (ref >60)
Glucose, Bld: 168 mg/dL — ABNORMAL HIGH (ref 70–99)
Potassium: 3.8 mmol/L (ref 3.5–5.1)
Sodium: 140 mmol/L (ref 135–145)
Total Bilirubin: 0.4 mg/dL (ref 0.3–1.2)
Total Protein: 6.3 g/dL — ABNORMAL LOW (ref 6.5–8.1)

## 2021-10-04 MED ORDER — SODIUM CHLORIDE 0.9 % IV SOLN: Freq: Once | INTRAVENOUS | Status: AC

## 2021-10-04 MED ORDER — PALONOSETRON HCL INJECTION 0.25 MG/5ML
0.2500 mg | Freq: Once | INTRAVENOUS | Status: AC
  Administered 2021-10-04: 0.25 mg via INTRAVENOUS
  Filled 2021-10-04: qty 5

## 2021-10-04 MED ORDER — HEPARIN SOD (PORK) LOCK FLUSH 100 UNIT/ML IV SOLN
500.0000 [IU] | Freq: Once | INTRAVENOUS | Status: AC | PRN
  Administered 2021-10-04: 500 [IU]

## 2021-10-04 MED ORDER — SODIUM CHLORIDE 0.9 % IV SOLN
67.5000 mg/m2 | Freq: Once | INTRAVENOUS | Status: AC
  Administered 2021-10-04: 110 mg via INTRAVENOUS
  Filled 2021-10-04: qty 11

## 2021-10-04 MED ORDER — SODIUM CHLORIDE 0.9% FLUSH
10.0000 mL | INTRAVENOUS | Status: DC | PRN
  Administered 2021-10-04: 10 mL

## 2021-10-04 MED ORDER — SODIUM CHLORIDE 0.9 % IV SOLN
600.0000 mg/m2 | Freq: Once | INTRAVENOUS | Status: AC
  Administered 2021-10-04: 1000 mg via INTRAVENOUS
  Filled 2021-10-04: qty 50

## 2021-10-04 MED ORDER — SODIUM CHLORIDE 0.9 % IV SOLN
10.0000 mg | Freq: Once | INTRAVENOUS | Status: AC
  Administered 2021-10-04: 10 mg via INTRAVENOUS
  Filled 2021-10-04: qty 10

## 2021-10-04 NOTE — Patient Instructions (Signed)

## 2021-10-04 NOTE — Progress Notes (Signed)
RN requests Taxotere to be infused over 2 hours due to patient not feeling well.  Henreitta Leber, PharmD

## 2021-10-04 NOTE — Progress Notes (Signed)
Here to receive cycle three of four today. Will move onto reconstruction and addition of AI after her fourth cycle.   Oncology Nurse Navigator Documentation     10/04/2021    8:45 AM  Oncology Nurse Navigator Flowsheets  Navigator Follow Up Date: 10/25/2021  Navigator Follow Up Reason: Follow-up Appointment;Chemotherapy  Navigator Restaurant manager, fast food Encounter Type Treatment;Appt/Treatment Plan Review  Patient Visit Type MedOnc  Treatment Phase Active Tx  Barriers/Navigation Needs Coordination of Care;Education  Interventions Psycho-Social Support  Acuity Level 1-No Barriers  Support Groups/Services Friends and Family  Time Spent with Patient 15

## 2021-10-04 NOTE — Patient Instructions (Addendum)
[facility]  Discharge Instructions: Thank you for choosing [facility] to provide your oncology and hematology care.   If you have a lab appointment with the Cancer Center, please go directly to the Cancer Center and check in at the registration area.  Wear comfortable clothing and clothing appropriate for easy access to any Portacath or PICC line.   We strive to give you quality time with your provider. You may need to reschedule your appointment if you arrive late (15 or more minutes).  Arriving late affects you and other patients whose appointments are after yours.  Also, if you miss three or more appointments without notifying the office, you may be dismissed from the clinic at the provider's discretion.      For prescription refill requests, have your pharmacy contact our office and allow 72 hours for refills to be completed.    Today you received the following chemotherapy and/or immunotherapy agents Taxotere and Cytoxan.   To help prevent nausea and vomiting after your treatment, we encourage you to take your nausea medication as directed.  BELOW ARE SYMPTOMS THAT SHOULD BE REPORTED IMMEDIATELY: *FEVER GREATER THAN 100.4 F (38 C) OR HIGHER *CHILLS OR SWEATING *NAUSEA AND VOMITING THAT IS NOT CONTROLLED WITH YOUR NAUSEA MEDICATION *UNUSUAL SHORTNESS OF BREATH *UNUSUAL BRUISING OR BLEEDING *URINARY PROBLEMS (pain or burning when urinating, or frequent urination) *BOWEL PROBLEMS (unusual diarrhea, constipation, pain near the anus) TENDERNESS IN MOUTH AND THROAT WITH OR WITHOUT PRESENCE OF ULCERS (sore throat, sores in mouth, or a toothache) UNUSUAL RASH, SWELLING OR PAIN  UNUSUAL VAGINAL DISCHARGE OR ITCHING   Items with * indicate a potential emergency and should be followed up as soon as possible or go to the Emergency Department if any problems should occur.  Please show the CHEMOTHERAPY ALERT CARD or IMMUNOTHERAPY ALERT CARD at check-in  to the Emergency Department and triage nurse. Should you have questions after your visit or need to cancel or reschedule your appointment, please contact [facility]  [phone] and follow the prompts.  Office hours are 8:00 a.m. to 4:30 p.m. Monday - Friday. Please note that voicemails left after 4:00 p.m. may not be returned until the following business day.  We are closed weekends and major holidays. You have access to a nurse at all times for urgent questions. Please call the main number to the clinic [phone] and follow the prompts.  For any non-urgent questions, you may also contact your provider using MyChart. We now offer e-Visits for anyone 18 and older to request care online for non-urgent symptoms. For details visit [email or web address].   Also download the MyChart app! Go to the app store, search "MyChart", open the app, select [facility], and log in with your MyChart username and password.  Masks are optional in the cancer centers. If you would like for your care team to wear a mask while they are taking care of you, please let them know. You may have one support person who is at least 55 years old accompany you for your appointments. 

## 2021-10-04 NOTE — Telephone Encounter (Signed)
Left message on follow up call. 

## 2021-10-04 NOTE — Progress Notes (Signed)
Hematology and Oncology Follow Up Visit  Saya [facility] [id number] 1965-04-20 56 y.o. 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  Leask is back for followup.  She is doing pretty well.  She did have esophageal dilation yesterday.  Her esophagus is still a little bit sore.  She is able to swallow.  She is taking in softer food.  She still needs to be in the foot splint for another 4 weeks.  She broke the fourth and fifth toes on the right foot.  She did have a lot of diarrhea last time we treated her.  She had to come in for some IV fluids.  We did put her on some Lomotil.  I told her to try taking Lomotil a day or so before she thinks the diarrhea may start.  She now has a new grandchild.  He was born a week ago.  Thankfully, everything went well with his birth.  She has had no bleeding.  She has had no rashes.  There is been no leg swelling.  She has had no cough or shortness of breath.  Patient did have some mucositis.  She is on Magic mouthwash.  Overall, I would say performance status is ECOG 1.    Medications:  Current Outpatient Medications:    acetaminophen (TYLENOL) 500 MG tablet, Take 1,000 mg by mouth every 6 (six) hours as needed for moderate pain or mild pain., Disp: , Rfl:    azithromycin (ZITHROMAX Z-PAK) 250 MG tablet, Take as directed., Disp: 6 each, Rfl: 0   butalbital-acetaminophen-caffeine (FIORICET, ESGIC) 50-325-40 MG per tablet, TAKE 1 TABLET BY MOUTH EVERY 6 HOURS AS NEEDED FOR HEADACHE, Disp: 20 tablet, Rfl: 0   cefdinir (OMNICEF) 300 MG capsule, Take 2 capsules (600 mg total) by mouth daily., Disp: 20 capsule, Rfl: 0   Cholecalciferol (VITAMIN D PO), Take 1,000 Units by mouth daily., Disp: , Rfl:    dexamethasone  (DECADRON) 4 MG tablet, Take 2 tablets (8 mg total) by mouth 2 (two) times daily. Start the dexamethasone 2 days before each chemotherapy cycle.  Take for 5 days total each cycle., Disp: 60 tablet, Rfl: 1   diphenoxylate-atropine (LOMOTIL) 2.5-0.025 MG tablet, Take 1 tablet by mouth 4 (four) times daily as needed for diarrhea or loose stools., Disp: 30 tablet, Rfl: 0   fluticasone (FLONASE) 50 MCG/ACT nasal spray, INHALE 2 SPRASY IN EACH NOSTRIL ONCE DAILY (Patient taking differently: Place 2 sprays into both nostrils daily as needed for allergies or rhinitis.), Disp: 16 g, Rfl: 11   gabapentin (NEURONTIN) 100 MG capsule, Take 1 capsule (100 mg total) by mouth 2 (two) times daily. (Patient taking differently: Take 100 mg by mouth daily as needed (Nerve pain).), Disp: 30 capsule, Rfl: 1   ibuprofen (ADVIL) 200 MG tablet, Take 400-600 mg by mouth every 6 (six) hours as needed for mild pain or moderate pain., Disp: , Rfl:    lidocaine-prilocaine (EMLA) cream, Apply to affected area once, Disp: 30 g, Rfl: 3   LORazepam (ATIVAN) 0.5 MG tablet, Take 1 tablet (0.5 mg total) by mouth every 6 (six) hours as needed for anxiety (nausea)., Disp: 30 tablet, Rfl: 0   magic mouthwash SOLN, Take 5 mLs by mouth 4 (four)  times daily as needed for mouth pain., Disp: 240 mL, Rfl: 0   methocarbamol (ROBAXIN) 500 MG tablet, Take 1 tablet (500 mg total) by mouth every 6 (six) hours as needed for muscle spasms., Disp: 20 tablet, Rfl: 1   ondansetron (ZOFRAN) 8 MG tablet, Take 1 tablet (8 mg total) by mouth 2 (two) times daily as needed for refractory nausea / vomiting. Start on day 3 after chemo., Disp: 30 tablet, Rfl: 1   oxyCODONE (OXY IR/ROXICODONE) 5 MG immediate release tablet, Take 1 tablet (5 mg total) by mouth once as needed (for pain score of 1-4)., Disp: 30 tablet, Rfl: 0   pantoprazole (PROTONIX) 40 MG tablet, TAKE 1 TABLET BY MOUTH EVERY DAY (Patient taking differently: Take 40 mg by mouth daily as needed  (Heartburn).), Disp: 90 tablet, Rfl: 3   prochlorperazine (COMPAZINE) 10 MG tablet, Take 1 tablet (10 mg total) by mouth every 6 (six) hours as needed (Nausea or vomiting)., Disp: 30 tablet, Rfl: 1   promethazine (PHENERGAN) 25 MG tablet, TAKE 1 TABLET BY MOUTH EVERY 8 HOURS AS NEEDED FOR NAUSEA/VOMITING, Disp: 20 tablet, Rfl: 0   rizatriptan (MAXALT) 10 MG tablet, TAKE 1 TABLET BY MOUTH EVERY DAY AS NEEDED FOR MIGRANE, Disp: 6 tablet, Rfl: 5   UNITHROID 112 MCG tablet, TAKE 1 TABLET BY MOUTH EVERY DAY BEFORE BREAKFAST, Disp: 90 tablet, Rfl: 1  Current Facility-Administered Medications:    0.9 %  sodium chloride infusion, 500 mL, Intravenous, Once, Danis, Kirke Corin, MD  Allergies:  Allergies  Allergen Reactions   Anesthetics, Ester Nausea And Vomiting   Erythromycin Nausea And Vomiting    Past Medical History, Surgical history, Social history, and Family History were reviewed and updated.  Review of Systems: Review of Systems  Constitutional: Negative.   HENT:  Positive for congestion.   Eyes: Negative.   Respiratory: Negative.    Cardiovascular: Negative.   Gastrointestinal: Negative.   Genitourinary: Negative.   Musculoskeletal: Negative.   Skin: Negative.   Neurological: Negative.   Endo/Heme/Allergies: Negative.   Psychiatric/Behavioral: Negative.       Physical Exam:  vitals were not taken for this visit.   Physical Exam Vitals reviewed.  Constitutional:      Comments: Her breast exam shows right mastectomy.  This is well-healed.  There is no nodularity.  There is a little bit of numbness along the mastectomy site.  There is no right axillary adenopathy.  Left breast is unremarkable.  There is no mass in the left breast.  There is no left axillary adenopathy.    HENT:     Head: Normocephalic and atraumatic.  Eyes:     Pupils: Pupils are equal, round, and reactive to light.  Cardiovascular:     Rate and Rhythm: Normal rate and regular rhythm.     Heart sounds:  Normal heart sounds.  Pulmonary:     Effort: Pulmonary effort is normal.     Breath sounds: Normal breath sounds.  Abdominal:     General: Bowel sounds are normal.     Palpations: Abdomen is soft.  Musculoskeletal:        General: No tenderness or deformity. Normal range of motion.     Cervical back: Normal range of motion.  Lymphadenopathy:     Cervical: No cervical adenopathy.  Skin:    General: Skin is warm and dry.     Findings: No erythema or rash.  Neurological:     Mental Status: She is alert and oriented  to person, place, and time.  Psychiatric:        Behavior: Behavior normal.        Thought Content: Thought content normal.        Judgment: Judgment normal.    Lab Results  Component Value Date   WBC 7.2 09/20/2021   HGB 12.6 09/20/2021   HCT 37.3 09/20/2021   MCV 90.5 09/20/2021   PLT 176 09/20/2021     Chemistry      Component Value Date/Time   NA 138 09/20/2021 1313   NA 140 09/26/2016 0743   NA 143 09/21/2015 0854   K 4.0 09/20/2021 1313   K 3.8 09/26/2016 0743   K 4.4 09/21/2015 0854   CL 102 09/20/2021 1313   CL 104 09/26/2016 0743   CO2 27 09/20/2021 1313   CO2 30 09/26/2016 0743   CO2 28 09/21/2015 0854   BUN 16 09/20/2021 1313   BUN 14 09/26/2016 0743   BUN 13.9 09/21/2015 0854   CREATININE 0.76 09/20/2021 1313   CREATININE 0.87 01/01/2018 1055   CREATININE 0.8 09/21/2015 0854      Component Value Date/Time   CALCIUM 9.4 09/20/2021 1313   CALCIUM 9.4 09/26/2016 0743   CALCIUM 10.1 09/21/2015 0854   ALKPHOS 80 09/20/2021 1313   ALKPHOS 80 09/26/2016 0743   ALKPHOS 98 09/21/2015 0854   AST 10 (L) 09/20/2021 1313   AST 37 (H) 09/21/2015 0854   ALT 13 09/20/2021 1313   ALT 35 09/26/2016 0743   ALT 57 (H) 09/21/2015 0854   BILITOT 0.4 09/20/2021 1313   BILITOT 0.53 09/21/2015 0854      Impression and Plan: Ms. Auzenne is a 56 year old white female with a history of stage I ductal carcinoma of the right breast. She was diagnosed 22 years  ago. Her tumor was ER positive. I  think that  she is cured.  Now, she has a second breast cancer.  Again I feel this is not a recurrence in the breast.  I feel this is a second breast cancer.  We will go with her third cycle of chemotherapy.  We are doing the chemotherapy because of the high Oncotype score.  I will plan to get her back in 3 more weeks for her fourth and final cycle of treatment.  She will need about a month or so to recover from chemotherapy.  She will then have the reconstruction done.  This is can be done down in [place].  Have I will put her on an aromatase inhibitor probably in October.   Volanda Napoleon, MD 8/25/20238:39 AM

## 2021-10-07 ENCOUNTER — Other Ambulatory Visit: Payer: Self-pay | Admitting: *Deleted

## 2021-10-07 ENCOUNTER — Inpatient Hospital Stay: Payer: BC Managed Care – PPO

## 2021-10-07 ENCOUNTER — Other Ambulatory Visit (HOSPITAL_BASED_OUTPATIENT_CLINIC_OR_DEPARTMENT_OTHER): Payer: Self-pay

## 2021-10-07 DIAGNOSIS — R112 Nausea with vomiting, unspecified: Secondary | ICD-10-CM

## 2021-10-07 DIAGNOSIS — C50811 Malignant neoplasm of overlapping sites of right female breast: Secondary | ICD-10-CM

## 2021-10-07 DIAGNOSIS — Z5111 Encounter for antineoplastic chemotherapy: Secondary | ICD-10-CM | POA: Diagnosis not present

## 2021-10-07 DIAGNOSIS — C50611 Malignant neoplasm of axillary tail of right female breast: Secondary | ICD-10-CM

## 2021-10-07 MED ORDER — DEXAMETHASONE 4 MG PO TABS
8.0000 mg | ORAL_TABLET | Freq: Two times a day (BID) | ORAL | 1 refills | Status: DC
  Filled 2021-10-07: qty 60, 15d supply, fill #0

## 2021-10-07 MED ORDER — ONDANSETRON HCL 4 MG/2ML IJ SOLN: 8.0000 mg | Freq: Once | INTRAMUSCULAR | Status: DC

## 2021-10-07 MED ORDER — SODIUM CHLORIDE 0.9 % IV SOLN
8.0000 mg | Freq: Once | INTRAVENOUS | Status: AC
  Administered 2021-10-07: 8 mg via INTRAVENOUS
  Filled 2021-10-07: qty 4

## 2021-10-07 MED ORDER — PEGFILGRASTIM-CBQV 6 MG/0.6ML ~~LOC~~ SOSY
6.0000 mg | PREFILLED_SYRINGE | Freq: Once | SUBCUTANEOUS | Status: AC
  Administered 2021-10-07: 6 mg via SUBCUTANEOUS
  Filled 2021-10-07: qty 0.6

## 2021-10-07 MED ORDER — LORAZEPAM 2 MG/ML IJ SOLN
1.0000 mg | INTRAMUSCULAR | Status: AC
  Administered 2021-10-07: 1 mg via INTRAVENOUS
  Filled 2021-10-07: qty 1

## 2021-10-07 MED ORDER — SODIUM CHLORIDE 0.9 % IV SOLN: Freq: Once | INTRAVENOUS | Status: AC

## 2021-10-07 NOTE — Progress Notes (Signed)
Patient experiencing N/V after chemotherapy. Fosaprepitant added to premeds for next cycle per Dr. Marin Olp.

## 2021-10-07 NOTE — Patient Instructions (Signed)

## 2021-10-16 ENCOUNTER — Encounter: Payer: Self-pay | Admitting: Rehabilitation

## 2021-10-16 ENCOUNTER — Ambulatory Visit: Payer: BC Managed Care – PPO | Attending: General Surgery | Admitting: Rehabilitation

## 2021-10-16 DIAGNOSIS — C50611 Malignant neoplasm of axillary tail of right female breast: Secondary | ICD-10-CM | POA: Diagnosis present

## 2021-10-16 DIAGNOSIS — M25611 Stiffness of right shoulder, not elsewhere classified: Secondary | ICD-10-CM | POA: Diagnosis present

## 2021-10-16 DIAGNOSIS — Z483 Aftercare following surgery for neoplasm: Secondary | ICD-10-CM | POA: Insufficient documentation

## 2021-10-16 DIAGNOSIS — Z171 Estrogen receptor negative status [ER-]: Secondary | ICD-10-CM | POA: Insufficient documentation

## 2021-10-16 DIAGNOSIS — R293 Abnormal posture: Secondary | ICD-10-CM | POA: Insufficient documentation

## 2021-10-16 NOTE — Therapy (Signed)
OUTPATIENT PHYSICAL THERAPY BREAST CANCER TREATMENT NOTE   Patient Name: Rachel [name] MRN: [id number] DOB:03/14/1965, 56 y.o., female Today's Date: 10/16/2021   PT End of Session - 10/16/21 1727     Visit Number 12    Number of Visits 15    Date for PT Re-Evaluation 10/22/21    PT Start Time 1505    PT Stop Time 1548    PT Time Calculation (min) 43 min    Activity Tolerance Patient tolerated treatment well    Behavior During Therapy [facility] for tasks assessed/performed                  Past Medical History:  Diagnosis Date   ASCUS of cervix with negative high risk HPV 03/2018   Bone pain due to G-CSF 08/23/2021   Cancer ([place]) 2001   HISTORY OF STAGE I INFILTRATING DUCTAL CARCINOMA OF RIGHT BREAST   Chiari malformation type I ([place])    Chronic headache    Family history of adverse reaction to anesthesia    Family history of uterine cancer    Hypercholesteremia    diet controlled   Hypothyroidism    on meds   Osteopenia 09/2018   T score -2.4.  Prior T score -2.5.  DEXA stable from prior study.   Personal history of chemotherapy 2001   Personal history of radiation therapy 2001   Pneumonia    Had 12-2017   PONV (postoperative nausea and vomiting)    Past Surgical History:  Procedure Laterality Date   BREAST EXCISIONAL BIOPSY Left 2021   BREAST LUMPECTOMY Right 2001   CESAREAN SECTION  1991   COLONOSCOPY     MASTECTOMY W/ SENTINEL NODE BIOPSY Right 06/25/2021   Procedure: RIGHT MASTECTOMY WITH SENTINEL LYMPH NODE BIOPSY;  Surgeon: Stark Klein, MD;  Location: [facility];  Service: General;  Laterality: Right;   NASAL SINUS SURGERY Right 02/20/2020   Procedure: ENDOSCOPIC SINUS SURGERY WITH RIGHT ETHMOIDECTOMY AND RIGHT MAXILLARY OSTIA ENLARGEMENT WITH FUSION;  Surgeon: Rozetta Nunnery, MD;  Location: [facility];  Service: ENT;  Laterality: Right;   PELVIC LAPAROSCOPY     endometriosis   PORTACATH PLACEMENT N/A 08/07/2021    Procedure: PORT PLACEMENT WITH ULTRASOUND GUIDANCE;  Surgeon: Stark Klein, MD;  Location: [facility];  Service: General;  Laterality: N/A;   SINUS ENDO WITH FUSION Right 02/20/2020   Procedure: SINUS ENDO WITH FUSION;  Surgeon: Rozetta Nunnery, MD;  Location: [facility];  Service: ENT;  Laterality: Right;   [place]   Patient Active Problem List   Diagnosis Date Noted   Bone pain due to G-CSF 08/23/2021   Cancer of overlapping sites of right female breast ([place]) 06/25/2021   Lateral epicondylitis, left elbow 03/04/2021   Left wrist pain 06/24/2019   Pain in joint of left shoulder 06/24/2019   Pain in joint of left elbow 06/24/2019   Family history of uterine cancer    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  The arm feels the same   PERTINENT HISTORY:  Patient was diagnosed on 06/03/21 with right breast cancer recurrence. Initial lumpectomy, SLNB x 3, radiation, chemotherapy, and antiestrogens. Rt mastectomy on 06/25/21 with Dr. Barry Dienes with another  SLNB 0/3 positive lymph nodes.  Other hx: chiari malformation   PATIENT GOALS:  Reassess how my recovery is going related to arm function, pain, and swelling.  PAIN:  Are you having pain? No, just tightness in the arm and across the chest  PRECAUTIONS: Recent Surgery, right UE Lymphedema risk, 6 total lymph nodes removed x 2 breast cancers OBJECTIVE:  PATIENT SURVEYS:  QUICK DASH: 80%  OBSERVATIONS:  Very well healed very flat mastectomy incision. No edema or redness  Pectoralis axillary border tight with spasm during Abduction  POSTURE:  Slightly guarded  LYMPHEDEMA ASSESSMENT:   UPPER EXTREMITY AROM/PROM:   A/PROM RIGHT  06/17/2021   07/18/21 07/30/21 08/06/21 09/10/21 10/16/21  Shoulder extension 53    50   Shoulder flexion 170 145 - feeling tight 155 158 - pull in axilla 160 - pull in the axilla 165 - pull in axilla   Shoulder abduction 170 114 130 - pectoralis muscle into chest 115 - pull in the pectoralis - some cording now noted  130 - pulling down arm  150 - pulling down the arm   Shoulder internal rotation         Shoulder external rotation 90    90                           (Blank rows = not tested)   CERVICAL AROM: All within normal limits:    LYMPHEDEMA ASSESSMENTS:    [facility] RIGHT  06/17/2021 6/8/23Rt 6/8/23Lt  10 cm proximal to olecranon process 36.5 36.5   Olecranon process 23.5 23.5   10 cm proximal to ulnar styloid process 20.6 20.6   Just proximal to ulnar styloid process 15.3 15.3   Across hand at thumb web space 18 18   At base of 2nd digit 5.8 5.8   (Blank rows = not tested)  TODAY'S TREATMENT: 10/16/21 Therapeutic  Exs:  Pulleys: x2 mins into flex and abduction     Manual Therapy: STM in supine to Rt chest wall and pectoralis insertion   MFR to Rt axilla during P/ROM P/ROM in supine to Rt shoulder to per tolerance into flexion,abduction with pt able to achieve full ROM  10/02/21: Therapeutic Exs:  Pulleys: x2 mins into flex and abduction     Manual  Therapy: STM in supine to Rt chest wall and pectoralis insertion   MFR to Rt axilla during P/ROM P/ROM in supine to Rt shoulder to per tolerance into flexion,abduction with pt able to achieve full ROM  09/25/21: Therapeutic Exs:  Pulleys: x2 mins into flex and abduction     Manual Therapy: STM in supine to Rt chest wall and pectoralis insertion   MFR to Rt axilla during P/ROM P/ROM in supine to Rt shoulder to per tolerance into flexion,abduction with pt able to achieve full ROM Sidelying pinning of axilla and overhead abduction AROM MFR.     PATIENT EDUCATION:  Education details: updated HEP, POC Person educated: Patient Education method: Explanation, Demonstration, Tactile cues, and Handouts Education comprehension: verbalized understanding, returned demonstration, verbal cues required, tactile cues required, and needs further education  HOME EXERCISE PROGRAM: Reviewed previously given post op HEP with changes as needed: Access Code: [email or web address] URL: [email or web address] Date: 07/18/2021 Prepared by: Shan Levans  Exercises - Supine Shoulder Flexion AAROM with Hands Clasped - 1-2 x daily - 7 x weekly - 1 sets - 10 reps - 5 second hold - Supine Chest Stretch with Elbows Bent - 1 x daily - 7 x weekly - 1 sets - 3 reps - 30-60seconds hold - Standing Shoulder Abduction AAROM with Dowel - 1-2 x daily - 7 x weekly - 1 sets - 10 reps - 5 second hold - Supine Thoracic Mobilization Towel Roll Vertical with Arm Stretch - 2 x daily - 7 x weekly - 1 sets - 1 reps - 20-30 seconds hold  ASSESSMENT CLINICAL IMPRESSION: AROM is improving with less pull especially into abduction. Continues to feel good in the arm after PT visits and then stiffening back up.     Pt will benefit from skilled therapeutic intervention to improve on the following deficits: Decreased knowledge of precautions, impaired UE functional use, pain, decreased ROM, postural dysfunction.   PT  treatment/interventions: ADL/Self care home management, Therapeutic exercises, Patient/Family education, and Manual therapy   GOALS: Goals reviewed with patient? Yes  LONG TERM GOALS:  (STG=LTG)  GOALS Name Target Date  Goal status  1 Pt will demonstrate she has regained full shoulder ROM and function post operatively compared to baselines.  Baseline: 08/15/2021 IN PROGRESS  2 Pt will be educated on lymphedema risk reduction- ABC class - and SOZO surveillance 08/15/21 INITIAL  3 Pt will feel ind with continued HEP 08/15/21 INITIAL          PLAN: PT FREQUENCY/DURATION: 1x per week for 6 weeks  PLAN FOR NEXT SESSION: continue MFR and STM to R pec and cording AAROM activities    Stark Bray, PT 10/16/2021, 5:28 PM

## 2021-10-23 ENCOUNTER — Ambulatory Visit: Payer: BC Managed Care – PPO | Admitting: Rehabilitation

## 2021-10-23 DIAGNOSIS — Z483 Aftercare following surgery for neoplasm: Secondary | ICD-10-CM

## 2021-10-23 DIAGNOSIS — C50611 Malignant neoplasm of axillary tail of right female breast: Secondary | ICD-10-CM

## 2021-10-23 DIAGNOSIS — R293 Abnormal posture: Secondary | ICD-10-CM

## 2021-10-23 DIAGNOSIS — M25611 Stiffness of right shoulder, not elsewhere classified: Secondary | ICD-10-CM | POA: Diagnosis not present

## 2021-10-23 NOTE — Therapy (Signed)
OUTPATIENT PHYSICAL THERAPY BREAST CANCER TREATMENT NOTE   Patient Name: Rachel [name] MRN: [id number] DOB:January 27, 1966, 56 y.o., female Today's Date: 10/23/2021   PT End of Session - 10/23/21 0954     Visit Number 13    Number of Visits 15    PT Start Time 0900    PT Stop Time 0954    PT Time Calculation (min) 54 min    Activity Tolerance Patient tolerated treatment well    Behavior During Therapy [facility] for tasks assessed/performed                   Past Medical History:  Diagnosis Date   ASCUS of cervix with negative high risk HPV 03/2018   Bone pain due to G-CSF 08/23/2021   Cancer ([place]) 2001   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  Prior T score -2.5.  DEXA stable from prior study.   Personal history of chemotherapy 2001   Personal history of radiation therapy 2001   Pneumonia    Had 12-2017   PONV (postoperative nausea and vomiting)    Past Surgical History:  Procedure Laterality Date   BREAST EXCISIONAL BIOPSY Left 2021   BREAST LUMPECTOMY Right 2001   CESAREAN SECTION  1991   COLONOSCOPY     MASTECTOMY W/ SENTINEL NODE BIOPSY Right 06/25/2021   Procedure: RIGHT MASTECTOMY WITH SENTINEL LYMPH NODE BIOPSY;  Surgeon: Stark Klein, MD;  Location: [place];  Service: General;  Laterality: Right;   NASAL SINUS SURGERY Right 02/20/2020   Procedure: ENDOSCOPIC SINUS SURGERY WITH RIGHT ETHMOIDECTOMY AND RIGHT MAXILLARY OSTIA ENLARGEMENT WITH FUSION;  Surgeon: Rozetta Nunnery, MD;  Location: [place];  Service: ENT;  Laterality: Right;   PELVIC LAPAROSCOPY     endometriosis   PORTACATH PLACEMENT N/A 08/07/2021   Procedure: PORT PLACEMENT WITH  ULTRASOUND GUIDANCE;  Surgeon: Stark Klein, MD;  Location: [facility];  Service: General;  Laterality: N/A;   SINUS ENDO WITH FUSION Right 02/20/2020   Procedure: SINUS ENDO WITH FUSION;  Surgeon: Rozetta Nunnery, MD;  Location: [place];  Service: ENT;  Laterality: Right;   [place]   Patient Active Problem List   Diagnosis Date Noted   Bone pain due to G-CSF 08/23/2021   Cancer of overlapping sites of right female breast ([place]) 06/25/2021   Lateral epicondylitis, left elbow 03/04/2021   Left wrist pain 06/24/2019   Pain in joint of left shoulder 06/24/2019   Pain in joint of left elbow 06/24/2019   Family history of uterine cancer    Malignant neoplasm of axillary tail of right breast in female, estrogen receptor negative ([place]) 03/28/2016   Breast CA ([place]) 03/31/2011   ASCUS (atypical squamous cells of undetermined significance) on Pap smear    Hypothyroidism    Osteopenia    Hypercholesteremia     PCP:  Jenna Luo, MD  REFERRING PROVIDER: Dr. Barry Dienes  REFERRING DIAG: Rt breast cancer  THERAPY DIAG:  Stiffness of right shoulder, not elsewhere classified  Aftercare following surgery for  neoplasm  Abnormal posture  Malignant neoplasm of axillary tail of right breast in female, estrogen receptor negative ([place])  Rationale for Evaluation and Treatment Rehabilitation  ONSET DATE: 06/03/21  SUBJECTIVE:                                                                                                                                                                                         SUBJECTIVE STATEMENT:   The cording seems to be getting better.  The pectoralis is really tight.    PERTINENT HISTORY:  Patient was diagnosed on 06/03/21 with right breast cancer recurrence. Initial lumpectomy, SLNB x 3, radiation, chemotherapy, and antiestrogens. Rt mastectomy on 06/25/21 with Dr. Barry Dienes with  another SLNB 0/3 positive lymph nodes.  Other hx: chiari malformation   PATIENT GOALS:  Reassess how my recovery is going related to arm function, pain, and swelling.  PAIN:  Are you having pain? No, just tightness in the arm and across the chest  PRECAUTIONS: Recent Surgery, right UE Lymphedema risk, 6 total lymph nodes removed x 2 breast cancers OBJECTIVE:  PATIENT SURVEYS:  QUICK DASH: 80%  OBSERVATIONS:  Very well healed very flat mastectomy incision. No edema or redness  Pectoralis axillary border tight with spasm during Abduction  POSTURE:  Slightly guarded  LYMPHEDEMA ASSESSMENT:   UPPER EXTREMITY AROM/PROM:   A/PROM RIGHT  06/17/2021   07/18/21 07/30/21 08/06/21 09/10/21 10/16/21  Shoulder extension 53    50   Shoulder flexion 170 145 - feeling tight 155 158 - pull in axilla 160 - pull in the axilla 165 - pull in axilla   Shoulder abduction 170 114 130 - pectoralis muscle into chest 115 - pull in the pectoralis - some cording now noted  130 - pulling down arm  150 - pulling down the arm   Shoulder internal rotation         Shoulder external rotation 90    90                           (Blank rows = not tested)   CERVICAL AROM: All within normal limits:    LYMPHEDEMA ASSESSMENTS:    [facility] RIGHT  06/17/2021 6/8/23Rt 6/8/23Lt  10 cm proximal to olecranon process 36.5 36.5   Olecranon process 23.5 23.5   10 cm proximal to ulnar styloid process 20.6 20.6   Just proximal to ulnar styloid process 15.3 15.3   Across hand at thumb web space 18 18   At base of 2nd digit 5.8 5.8   (Blank rows = not tested)  TODAY'S TREATMENT:  10/23/21 Therapeutic Exs:  Pulleys: x2 mins into flex and abduction   Ball into flexion and abduction x 5 each  Manual Therapy: STM in supine to Rt chest wall and pectoralis insertion   MFR to Rt axilla during P/ROM P/ROM in supine to Rt shoulder to per tolerance into flexion,abduction with pt able to achieve full ROM  10/16/21 Therapeutic  Exs:  Pulleys: x2 mins into flex and abduction     Manual Therapy: STM in supine to Rt chest wall and pectoralis insertion   MFR to Rt axilla during P/ROM P/ROM in supine to Rt shoulder to per tolerance into flexion,abduction with pt able to achieve full ROM  10/02/21: Therapeutic Exs:  Pulleys: x2 mins into flex and abduction     Manual Therapy: STM in supine to Rt chest wall and pectoralis insertion   MFR to Rt axilla during P/ROM P/ROM in supine to Rt shoulder to per tolerance into flexion,abduction with pt able to achieve full ROM   PATIENT EDUCATION:  Education details: updated HEP, POC Person educated: Patient Education method: Explanation, Demonstration, Tactile cues, and Handouts Education comprehension: verbalized understanding, returned demonstration, verbal cues required, tactile cues required, and needs further education  HOME EXERCISE PROGRAM: Reviewed previously given post op HEP with changes as needed: Access Code: [email or web address] URL: [email or web address] Date: 07/18/2021 Prepared by: Shan Levans  Exercises - Supine Shoulder Flexion AAROM with Hands Clasped - 1-2 x daily - 7 x weekly - 1 sets - 10 reps - 5 second hold - Supine Chest Stretch with Elbows Bent - 1 x daily - 7 x weekly - 1 sets - 3 reps - 30-60seconds hold - Standing Shoulder Abduction AAROM with Dowel - 1-2 x daily - 7 x weekly - 1 sets - 10 reps - 5 second hold - Supine Thoracic Mobilization Towel Roll Vertical with Arm Stretch - 2 x daily - 7 x weekly - 1 sets - 1 reps - 20-30 seconds hold  ASSESSMENT CLINICAL IMPRESSION: Cording was not palpable today and pt notes more pectoralis tightness.  Has last infusion Friday.   Pt will benefit from skilled therapeutic intervention to improve on the following deficits: Decreased knowledge of precautions, impaired UE functional use, pain, decreased ROM, postural dysfunction.   PT treatment/interventions: ADL/Self care home management, Therapeutic  exercises, Patient/Family education, and Manual therapy   GOALS: Goals reviewed with patient? Yes  LONG TERM GOALS:  (STG=LTG)  GOALS Name Target Date  Goal status  1 Pt will demonstrate she has regained full shoulder ROM and function post operatively compared to baselines.  Baseline: 08/15/2021 IN PROGRESS  2 Pt will be educated on lymphedema risk reduction- ABC class - and SOZO surveillance 08/15/21 INITIAL  3 Pt will feel ind with continued HEP 08/15/21 INITIAL          PLAN: PT FREQUENCY/DURATION: 1x per week for 6 weeks  PLAN FOR NEXT SESSION: continue MFR and STM to R pec and cording AAROM activities    Stark Bray, PT 10/23/2021, 9:55 AM

## 2021-10-25 ENCOUNTER — Encounter: Payer: Self-pay | Admitting: *Deleted

## 2021-10-25 ENCOUNTER — Inpatient Hospital Stay: Payer: BC Managed Care – PPO

## 2021-10-25 ENCOUNTER — Encounter: Payer: Self-pay | Admitting: Hematology & Oncology

## 2021-10-25 ENCOUNTER — Other Ambulatory Visit: Payer: Self-pay

## 2021-10-25 ENCOUNTER — Inpatient Hospital Stay (HOSPITAL_BASED_OUTPATIENT_CLINIC_OR_DEPARTMENT_OTHER): Payer: BC Managed Care – PPO | Admitting: Hematology & Oncology

## 2021-10-25 ENCOUNTER — Inpatient Hospital Stay: Payer: BC Managed Care – PPO | Attending: Hematology & Oncology

## 2021-10-25 ENCOUNTER — Other Ambulatory Visit (HOSPITAL_BASED_OUTPATIENT_CLINIC_OR_DEPARTMENT_OTHER): Payer: Self-pay

## 2021-10-25 VITALS — BP 102/52 | HR 57 | Temp 98.1°F | Resp 18 | Ht 64.0 in | Wt 143.0 lb

## 2021-10-25 DIAGNOSIS — C50911 Malignant neoplasm of unspecified site of right female breast: Secondary | ICD-10-CM | POA: Diagnosis present

## 2021-10-25 DIAGNOSIS — Z9221 Personal history of antineoplastic chemotherapy: Secondary | ICD-10-CM | POA: Diagnosis not present

## 2021-10-25 DIAGNOSIS — Z170 Estrogen receptor positive status [ER+]: Secondary | ICD-10-CM | POA: Insufficient documentation

## 2021-10-25 DIAGNOSIS — C50811 Malignant neoplasm of overlapping sites of right female breast: Secondary | ICD-10-CM

## 2021-10-25 DIAGNOSIS — Z5111 Encounter for antineoplastic chemotherapy: Secondary | ICD-10-CM | POA: Insufficient documentation

## 2021-10-25 DIAGNOSIS — Z853 Personal history of malignant neoplasm of breast: Secondary | ICD-10-CM | POA: Insufficient documentation

## 2021-10-25 DIAGNOSIS — Z9011 Acquired absence of right breast and nipple: Secondary | ICD-10-CM | POA: Diagnosis not present

## 2021-10-25 DIAGNOSIS — R197 Diarrhea, unspecified: Secondary | ICD-10-CM | POA: Insufficient documentation

## 2021-10-25 DIAGNOSIS — R110 Nausea: Secondary | ICD-10-CM | POA: Diagnosis not present

## 2021-10-25 DIAGNOSIS — Z95828 Presence of other vascular implants and grafts: Secondary | ICD-10-CM

## 2021-10-25 DIAGNOSIS — C50611 Malignant neoplasm of axillary tail of right female breast: Secondary | ICD-10-CM

## 2021-10-25 LAB — CMP (CANCER CENTER ONLY)
ALT: 15 U/L (ref 0–44)
AST: 12 U/L — ABNORMAL LOW (ref 15–41)
Albumin: 4.2 g/dL (ref 3.5–5.0)
Alkaline Phosphatase: 71 U/L (ref 38–126)
Anion gap: 7 (ref 5–15)
BUN: 19 mg/dL (ref 6–20)
CO2: 27 mmol/L (ref 22–32)
Calcium: 9.6 mg/dL (ref 8.9–10.3)
Chloride: 108 mmol/L (ref 98–111)
Creatinine: 0.77 mg/dL (ref 0.44–1.00)
GFR, Estimated: >60 mL/min (ref >60)
Glucose, Bld: 95 mg/dL (ref 70–99)
Potassium: 3.9 mmol/L (ref 3.5–5.1)
Sodium: 142 mmol/L (ref 135–145)
Total Bilirubin: 0.4 mg/dL (ref 0.3–1.2)
Total Protein: 6.3 g/dL — ABNORMAL LOW (ref 6.5–8.1)

## 2021-10-25 LAB — CBC WITH DIFFERENTIAL (CANCER CENTER ONLY)
Abs Immature Granulocytes: 0.07 10*3/uL (ref 0.00–0.07)
Basophils Absolute: 0 10*3/uL (ref 0.0–0.1)
Basophils Relative: 0 %
Eosinophils Absolute: 0 10*3/uL (ref 0.0–0.5)
Eosinophils Relative: 0 %
HCT: 34.3 % — ABNORMAL LOW (ref 36.0–46.0)
Hemoglobin: 11.4 g/dL — ABNORMAL LOW (ref 12.0–15.0)
Immature Granulocytes: 1 %
Lymphocytes Relative: 25 %
Lymphs Abs: 3.6 10*3/uL (ref 0.7–4.0)
MCH: 31.4 pg (ref 26.0–34.0)
MCHC: 33.2 g/dL (ref 30.0–36.0)
MCV: 94.5 fL (ref 80.0–100.0)
Monocytes Absolute: 1 10*3/uL (ref 0.1–1.0)
Monocytes Relative: 7 %
Neutro Abs: 9.5 10*3/uL — ABNORMAL HIGH (ref 1.7–7.7)
Neutrophils Relative %: 67 %
Platelet Count: 327 10*3/uL (ref 150–400)
RBC: 3.63 MIL/uL — ABNORMAL LOW (ref 3.87–5.11)
RDW: 15.4 % (ref 11.5–15.5)
WBC Count: 14.1 10*3/uL — ABNORMAL HIGH (ref 4.0–10.5)
nRBC: 0 % (ref 0.0–0.2)

## 2021-10-25 LAB — LACTATE DEHYDROGENASE: LDH: 170 U/L (ref 98–192)

## 2021-10-25 MED ORDER — LORAZEPAM 2 MG/ML IJ SOLN
0.5000 mg | Freq: Once | INTRAMUSCULAR | Status: AC
  Administered 2021-10-25: 0.5 mg via INTRAVENOUS
  Filled 2021-10-25: qty 1

## 2021-10-25 MED ORDER — LORAZEPAM 0.5 MG PO TABS
0.5000 mg | ORAL_TABLET | Freq: Four times a day (QID) | ORAL | 0 refills | Status: DC | PRN
  Filled 2021-10-25: qty 30, 8d supply, fill #0

## 2021-10-25 MED ORDER — CIPROFLOXACIN HCL 500 MG PO TABS
500.0000 mg | ORAL_TABLET | Freq: Every day | ORAL | 0 refills | Status: DC
  Filled 2021-10-25: qty 10, 10d supply, fill #0

## 2021-10-25 MED ORDER — SODIUM CHLORIDE 0.9 % IV SOLN
110.0000 mg | Freq: Once | INTRAVENOUS | Status: AC
  Administered 2021-10-25: 110 mg via INTRAVENOUS
  Filled 2021-10-25: qty 11

## 2021-10-25 MED ORDER — HEPARIN SOD (PORK) LOCK FLUSH 100 UNIT/ML IV SOLN
500.0000 [IU] | Freq: Once | INTRAVENOUS | Status: AC | PRN
  Administered 2021-10-25: 500 [IU]

## 2021-10-25 MED ORDER — SODIUM CHLORIDE 0.9% FLUSH
10.0000 mL | Freq: Once | INTRAVENOUS | Status: AC
  Administered 2021-10-25: 10 mL via INTRAVENOUS

## 2021-10-25 MED ORDER — PALONOSETRON HCL INJECTION 0.25 MG/5ML
0.2500 mg | Freq: Once | INTRAVENOUS | Status: AC
  Administered 2021-10-25: 0.25 mg via INTRAVENOUS
  Filled 2021-10-25: qty 5

## 2021-10-25 MED ORDER — SODIUM CHLORIDE 0.9% FLUSH
10.0000 mL | INTRAVENOUS | Status: DC | PRN
  Administered 2021-10-25: 10 mL

## 2021-10-25 MED ORDER — SODIUM CHLORIDE 0.9 % IV SOLN
10.0000 mg | Freq: Once | INTRAVENOUS | Status: AC
  Administered 2021-10-25: 10 mg via INTRAVENOUS
  Filled 2021-10-25: qty 10

## 2021-10-25 MED ORDER — SODIUM CHLORIDE 0.9 % IV SOLN: Freq: Once | INTRAVENOUS | Status: AC

## 2021-10-25 MED ORDER — SODIUM CHLORIDE 0.9 % IV SOLN
150.0000 mg | Freq: Once | INTRAVENOUS | Status: AC
  Administered 2021-10-25: 150 mg via INTRAVENOUS
  Filled 2021-10-25: qty 150

## 2021-10-25 MED ORDER — SODIUM CHLORIDE 0.9 % IV SOLN
590.0000 mg/m2 | Freq: Once | INTRAVENOUS | Status: AC
  Administered 2021-10-25: 1000 mg via INTRAVENOUS
  Filled 2021-10-25: qty 50

## 2021-10-25 NOTE — Patient Instructions (Signed)

## 2021-10-25 NOTE — Progress Notes (Signed)
Hematology and Oncology Follow Up Visit  Rachel [name] [facility] [id number] Sep 02, 1965 56 y.o. 10/25/2021   Principle Diagnosis:  Stage I (T1 N0M0) infiltrating ductal carcinoma the right breast - ER+/PR+/HER2- --  Oncotype = 26  Current Therapy:   Status post mastectomy on 06/25/2021 Taxotere/Cytoxan-adjuvant therapy -- s/p cycle #3/4 - start on 08/16/2021 --completed on 10/25/2021     Interim History:  Ms.  Hossain is back for followup.  She really had a tough time with the last cycle of chemotherapy.  She had a lot of pain with the Neulasta injection.  This really exacerbated the nausea that she had.  With this cycle, we can hold off on the Neulasta.  Otherwise, she seems be doing better.  The right foot seems to be doing a lot better.  She does not longer have a splint on.  She did have some diarrhea with the last cycle of chemotherapy.  We had her on Lomotil.  We will try her on some lorazepam in the office to see if this may help with some of the nausea she may have when she gets home.  She has had no fever.  She has had no bleeding.  There has been no rashes.  Overall, I would say performance status is ECOG 0.   Medications:  Current Outpatient Medications:    acetaminophen (TYLENOL) 500 MG tablet, Take 1,000 mg by mouth every 6 (six) hours as needed for moderate pain or mild pain., Disp: , Rfl:    butalbital-acetaminophen-caffeine (FIORICET, ESGIC) 50-325-40 MG per tablet, TAKE 1 TABLET BY MOUTH EVERY 6 HOURS AS NEEDED FOR HEADACHE, Disp: 20 tablet, Rfl: 0   Cholecalciferol (VITAMIN D PO), Take 1,000 Units by mouth daily., Disp: , Rfl:    dexamethasone (DECADRON) 4 MG tablet, Take 2 tablets (8 mg total) by mouth 2 (two) times daily. Start the dexamethasone 2 days before each chemotherapy cycle.  Take for 5 days total each cycle., Disp: 60 tablet, Rfl: 1   diphenoxylate-atropine (LOMOTIL) 2.5-0.025 MG tablet, Take 1 tablet by mouth 4 (four) times daily as needed for diarrhea or loose stools.,  Disp: 30 tablet, Rfl: 0   fluticasone (FLONASE) 50 MCG/ACT nasal spray, INHALE 2 SPRASY IN EACH NOSTRIL ONCE DAILY (Patient taking differently: Place 2 sprays into both nostrils daily as needed for allergies or rhinitis.), Disp: 16 g, Rfl: 11   gabapentin (NEURONTIN) 100 MG capsule, Take 1 capsule (100 mg total) by mouth 2 (two) times daily. (Patient taking differently: Take 100 mg by mouth daily as needed (Nerve pain).), Disp: 30 capsule, Rfl: 1   ibuprofen (ADVIL) 200 MG tablet, Take 400-600 mg by mouth every 6 (six) hours as needed for mild pain or moderate pain., Disp: , Rfl:    LORazepam (ATIVAN) 0.5 MG tablet, Take 1 tablet (0.5 mg total) by mouth every 6 (six) hours as needed for anxiety (nausea)., Disp: 30 tablet, Rfl: 0   magic mouthwash SOLN, Take 5 mLs by mouth 4 (four) times daily as needed for mouth pain., Disp: 240 mL, Rfl: 0   methocarbamol (ROBAXIN) 500 MG tablet, Take 1 tablet (500 mg total) by mouth every 6 (six) hours as needed for muscle spasms., Disp: 20 tablet, Rfl: 1   oxyCODONE (OXY IR/ROXICODONE) 5 MG immediate release tablet, Take 1 tablet (5 mg total) by mouth once as needed (for pain score of 1-4)., Disp: 30 tablet, Rfl: 0   pantoprazole (PROTONIX) 40 MG tablet, TAKE 1 TABLET BY MOUTH EVERY DAY (Patient taking differently:  Take 40 mg by mouth daily as needed (Heartburn).), Disp: 90 tablet, Rfl: 3   promethazine (PHENERGAN) 25 MG tablet, TAKE 1 TABLET BY MOUTH EVERY 8 HOURS AS NEEDED FOR NAUSEA/VOMITING, Disp: 20 tablet, Rfl: 0   rizatriptan (MAXALT) 10 MG tablet, TAKE 1 TABLET BY MOUTH EVERY DAY AS NEEDED FOR MIGRANE, Disp: 6 tablet, Rfl: 5   UNITHROID 112 MCG tablet, TAKE 1 TABLET BY MOUTH EVERY DAY BEFORE BREAKFAST, Disp: 90 tablet, Rfl: 1  Current Facility-Administered Medications:    ondansetron (ZOFRAN) injection 8 mg, 8 mg, Intravenous, Once, Theordore Cisnero, Rudell Cobb, MD  Allergies:  Allergies  Allergen Reactions   Anesthetics, Ester Nausea And Vomiting   Erythromycin  Nausea And Vomiting    Past Medical History, Surgical history, Social history, and Family History were reviewed and updated.  Review of Systems: Review of Systems  Constitutional: Negative.   HENT:  Positive for congestion.   Eyes: Negative.   Respiratory: Negative.    Cardiovascular: Negative.   Gastrointestinal: Negative.   Genitourinary: Negative.   Musculoskeletal: Negative.   Skin: Negative.   Neurological: Negative.   Endo/Heme/Allergies: Negative.   Psychiatric/Behavioral: Negative.       Physical Exam:  height is 5' 4"  (1.626 m) and weight is 143 lb (64.9 kg). Her temperature is 98.1 F (36.7 C). Her blood pressure is 102/52 (abnormal) and her pulse is 57 (abnormal). Her respiration is 18 and oxygen saturation is 100%.   Physical Exam Vitals reviewed.  Constitutional:      Comments: Her breast exam shows right mastectomy.  This is well-healed.  There is no nodularity.  There is a little bit of numbness along the mastectomy site.  There is no right axillary adenopathy.  Left breast is unremarkable.  There is no mass in the left breast.  There is no left axillary adenopathy.    HENT:     Head: Normocephalic and atraumatic.  Eyes:     Pupils: Pupils are equal, round, and reactive to light.  Cardiovascular:     Rate and Rhythm: Normal rate and regular rhythm.     Heart sounds: Normal heart sounds.  Pulmonary:     Effort: Pulmonary effort is normal.     Breath sounds: Normal breath sounds.  Abdominal:     General: Bowel sounds are normal.     Palpations: Abdomen is soft.  Musculoskeletal:        General: No tenderness or deformity. Normal range of motion.     Cervical back: Normal range of motion.  Lymphadenopathy:     Cervical: No cervical adenopathy.  Skin:    General: Skin is warm and dry.     Findings: No erythema or rash.  Neurological:     Mental Status: She is alert and oriented to person, place, and time.  Psychiatric:        Behavior: Behavior normal.         Thought Content: Thought content normal.        Judgment: Judgment normal.     Lab Results  Component Value Date   WBC 14.1 (H) 10/25/2021   HGB 11.4 (L) 10/25/2021   HCT 34.3 (L) 10/25/2021   MCV 94.5 10/25/2021   PLT 327 10/25/2021     Chemistry      Component Value Date/Time   NA 142 10/25/2021 0845   NA 140 09/26/2016 0743   NA 143 09/21/2015 0854   K 3.9 10/25/2021 0845   K 3.8 09/26/2016 0743   K  4.4 09/21/2015 0854   CL 108 10/25/2021 0845   CL 104 09/26/2016 0743   CO2 27 10/25/2021 0845   CO2 30 09/26/2016 0743   CO2 28 09/21/2015 0854   BUN 19 10/25/2021 0845   BUN 14 09/26/2016 0743   BUN 13.9 09/21/2015 0854   CREATININE 0.77 10/25/2021 0845   CREATININE 0.87 01/01/2018 1055   CREATININE 0.8 09/21/2015 0854      Component Value Date/Time   CALCIUM 9.6 10/25/2021 0845   CALCIUM 9.4 09/26/2016 0743   CALCIUM 10.1 09/21/2015 0854   ALKPHOS 71 10/25/2021 0845   ALKPHOS 80 09/26/2016 0743   ALKPHOS 98 09/21/2015 0854   AST 12 (L) 10/25/2021 0845   AST 37 (H) 09/21/2015 0854   ALT 15 10/25/2021 0845   ALT 35 09/26/2016 0743   ALT 57 (H) 09/21/2015 0854   BILITOT 0.4 10/25/2021 0845   BILITOT 0.53 09/21/2015 0854      Impression and Plan: Ms. Bevard is a 56 year old white female with a history of stage I ductal carcinoma of the right breast. She was diagnosed 22 years ago. Her tumor was ER positive. I  think that  she is cured.  Now, she has a second breast cancer.  Again I feel this is not a recurrence in the breast.  I feel this is a second breast cancer.  We will finish of her adjuvant chemotherapy with this cycle.  We will then plan to get her on antiestrogen therapy.  I will put her on a aromatase inhibitor.  I think this would be reasonable.  I do not think we have to put her on a CDK inhibitor.  I think she had a positive lymph node, we could then add a CDK inhibitor to the aromatase inhibitor.  We will plan to get her back in about 4 5  weeks now.  We probably can also think about getting her Port-A-Cath taken out when we see her back.    Volanda Napoleon, MD 9/15/20239:40 AM

## 2021-10-25 NOTE — Patient Instructions (Signed)
[facility]  Discharge Instructions: Thank you for choosing [facility] to provide your oncology and hematology care.   If you have a lab appointment with the [facility], please go directly to the [facility] and check in at the registration area.  Wear comfortable clothing and clothing appropriate for easy access to any Portacath or PICC line.   We strive to give you quality time with your provider. You may need to reschedule your appointment if you arrive late (15 or more minutes).  Arriving late affects you and other patients whose appointments are after yours.  Also, if you miss three or more appointments without notifying the office, you may be dismissed from the clinic at the provider's discretion.      For prescription refill requests, have your pharmacy contact our office and allow 72 hours for refills to be completed.    Today you received the following chemotherapy and/or immunotherapy agents cytoxan, taxotere     To help prevent nausea and vomiting after your treatment, we encourage you to take your nausea medication as directed.  BELOW ARE SYMPTOMS THAT SHOULD BE REPORTED IMMEDIATELY: *FEVER GREATER THAN 100.4 F (38 C) OR HIGHER *CHILLS OR SWEATING *NAUSEA AND VOMITING THAT IS NOT CONTROLLED WITH YOUR NAUSEA MEDICATION *UNUSUAL SHORTNESS OF BREATH *UNUSUAL BRUISING OR BLEEDING *URINARY PROBLEMS (pain or burning when urinating, or frequent urination) *BOWEL PROBLEMS (unusual diarrhea, constipation, pain near the anus) TENDERNESS IN MOUTH AND THROAT WITH OR WITHOUT PRESENCE OF ULCERS (sore throat, sores in mouth, or a toothache) UNUSUAL RASH, SWELLING OR PAIN  UNUSUAL VAGINAL DISCHARGE OR ITCHING   Items with * indicate a potential emergency and should be followed up as soon as possible or go to the Emergency Department if any problems should occur.  Please show the CHEMOTHERAPY ALERT CARD or IMMUNOTHERAPY ALERT CARD at check-in  to the Emergency Department and triage nurse. Should you have questions after your visit or need to cancel or reschedule your appointment, please contact [facility]  [phone] and follow the prompts.  Office hours are 8:00 a.m. to 4:30 p.m. Monday - Friday. Please note that voicemails left after 4:00 p.m. may not be returned until the following business day.  We are closed weekends and major holidays. You have access to a nurse at all times for urgent questions. Please call the main number to the clinic [phone] and follow the prompts.  For any non-urgent questions, you may also contact your provider using MyChart. We now offer e-Visits for anyone 62 and older to request care online for non-urgent symptoms. For details visit [email or web address].   Also download the MyChart app! Go to the app store, search "MyChart", open the app, select [facility], and log in with your MyChart username and password.  Masks are optional in the cancer centers. If you would like for your care team to wear a mask while they are taking care of you, please let them know. You may have one support person who is at least 55 years old accompany you for your appointments.

## 2021-10-25 NOTE — Progress Notes (Unsigned)
Patient will receive her final chemo treatment today. She will not get her neulasta due to increased side effects from the last cycle.  Oncology Nurse Navigator Documentation     10/25/2021    9:45 AM  Oncology Nurse Navigator Flowsheets  Navigator Follow Up Date: 11/27/2021  Navigator Follow Up Reason: Follow-up Appointment  Navigator Location [facility]  Navigator Encounter Type Treatment;Appt/Treatment Plan Review  Patient Visit Type MedOnc  Treatment Phase Final Chemo TX  Barriers/Navigation Needs No Barriers At This Time  Interventions Psycho-Social Support  Acuity Level 1-No Barriers  Support Groups/Services Friends and Family  Time Spent with Patient 15

## 2021-10-28 ENCOUNTER — Inpatient Hospital Stay: Payer: BC Managed Care – PPO

## 2021-10-29 ENCOUNTER — Encounter: Payer: Self-pay | Admitting: Hematology & Oncology

## 2021-11-06 ENCOUNTER — Encounter: Payer: Self-pay | Admitting: Rehabilitation

## 2021-11-06 ENCOUNTER — Ambulatory Visit: Payer: BC Managed Care – PPO | Admitting: Rehabilitation

## 2021-11-06 DIAGNOSIS — M25611 Stiffness of right shoulder, not elsewhere classified: Secondary | ICD-10-CM

## 2021-11-06 DIAGNOSIS — Z483 Aftercare following surgery for neoplasm: Secondary | ICD-10-CM

## 2021-11-06 DIAGNOSIS — R293 Abnormal posture: Secondary | ICD-10-CM

## 2021-11-06 DIAGNOSIS — Z171 Estrogen receptor negative status [ER-]: Secondary | ICD-10-CM

## 2021-11-06 NOTE — Therapy (Signed)
OUTPATIENT PHYSICAL THERAPY BREAST CANCER TREATMENT NOTE   Patient Name: Rachel [name] MRN: [id number] DOB:06-05-1965, 56 y.o., female Today's Date: 11/06/2021   PT End of Session - 11/06/21 0855     Visit Number 14    Number of Visits 15    Date for PT Re-Evaluation 10/22/21    PT Start Time 0810    PT Stop Time 0855    PT Time Calculation (min) 45 min    Activity Tolerance Patient tolerated treatment well    Behavior During Therapy [facility] for tasks assessed/performed                    Past Medical History:  Diagnosis Date   ASCUS of cervix with negative high risk HPV 03/2018   Bone pain due to G-CSF 08/23/2021   Cancer ([place]) 2001   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  Prior T score -2.5.  DEXA stable from prior study.   Personal history of chemotherapy 2001   Personal history of radiation therapy 2001   Pneumonia    Had 12-2017   PONV (postoperative nausea and vomiting)    Past Surgical History:  Procedure Laterality Date   BREAST EXCISIONAL BIOPSY Left 2021   BREAST LUMPECTOMY Right 2001   CESAREAN SECTION  1991   COLONOSCOPY     MASTECTOMY W/ SENTINEL NODE BIOPSY Right 06/25/2021   Procedure: RIGHT MASTECTOMY WITH SENTINEL LYMPH NODE BIOPSY;  Surgeon: Stark Klein, MD;  Location: [place];  Service: General;  Laterality: Right;   NASAL SINUS SURGERY Right 02/20/2020   Procedure: ENDOSCOPIC SINUS SURGERY WITH RIGHT ETHMOIDECTOMY AND RIGHT MAXILLARY OSTIA ENLARGEMENT WITH FUSION;  Surgeon: Rozetta Nunnery, MD;  Location: [place];  Service: ENT;  Laterality: Right;   PELVIC LAPAROSCOPY     endometriosis   PORTACATH PLACEMENT N/A 08/07/2021    Procedure: PORT PLACEMENT WITH ULTRASOUND GUIDANCE;  Surgeon: Stark Klein, MD;  Location: [facility];  Service: General;  Laterality: N/A;   SINUS ENDO WITH FUSION Right 02/20/2020   Procedure: SINUS ENDO WITH FUSION;  Surgeon: Rozetta Nunnery, MD;  Location: [place];  Service: ENT;  Laterality: Right;   [place]   Patient Active Problem List   Diagnosis Date Noted   Bone pain due to G-CSF 08/23/2021   Cancer of overlapping sites of right female breast ([place]) 06/25/2021   Lateral epicondylitis, left elbow 03/04/2021   Left wrist pain 06/24/2019   Pain in joint of left shoulder 06/24/2019   Pain in joint of left elbow 06/24/2019   Family history of uterine cancer    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  I feel the cording now only up to mid forearm  PERTINENT HISTORY:  Patient was diagnosed on 06/03/21 with right breast cancer recurrence. Initial lumpectomy, SLNB x 3, radiation, chemotherapy, and antiestrogens. Rt mastectomy on 06/25/21 with  Dr. Barry Dienes with another SLNB 0/3 positive lymph nodes.  Other hx: chiari malformation   PATIENT GOALS:  Reassess how my recovery is going related to arm function, pain, and swelling.  PAIN:  Are you having pain? No, just tightness in the arm and across the chest  PRECAUTIONS: Recent Surgery, right UE Lymphedema risk, 6 total lymph nodes removed x 2 breast cancers OBJECTIVE:  PATIENT SURVEYS:  QUICK DASH: 80%  OBSERVATIONS:  Very well healed very flat mastectomy incision. No edema or redness  Pectoralis axillary border tight with spasm during Abduction  POSTURE:  Slightly guarded  LYMPHEDEMA ASSESSMENT:   UPPER EXTREMITY AROM/PROM:   A/PROM RIGHT  06/17/2021   07/18/21 07/30/21 08/06/21 09/10/21 10/16/21 11/06/21  Shoulder extension 53    50    Shoulder flexion 170 145 - feeling tight 155 158 - pull in axilla 160 - pull in the axilla 165 - pull in axilla  170  Shoulder abduction 170 114 130 - pectoralis muscle into chest 115 - pull in the pectoralis - some cording now noted  130 - pulling down arm  150 - pulling down the arm  168  Shoulder internal rotation          Shoulder external rotation 90    90                            (Blank rows = not tested)   CERVICAL AROM: All within normal limits:    LYMPHEDEMA ASSESSMENTS:    [facility] RIGHT  06/17/2021 6/8/23Rt 6/8/23Lt  10 cm proximal to olecranon process 36.5 36.5   Olecranon process 23.5 23.5   10 cm proximal to ulnar styloid process 20.6 20.6   Just proximal to ulnar styloid process 15.3 15.3   Across hand at thumb web space 18 18   At base of 2nd digit 5.8 5.8   (Blank  rows = not tested)  TODAY'S TREATMENT: 11/06/21 Therapeutic Exs:  Pulleys: x2 mins into flex and abduction   Ball into flexion and abduction x 5 each  LTR with goal post arms 6" x 5 each   Manual Therapy: STM in supine to Rt chest wall and pectoralis insertion   MFR to Rt axilla during P/ROM P/ROM in supine to Rt shoulder to per tolerance into  flexion,abduction with pt able to achieve full ROM  10/23/21 Therapeutic Exs:  Pulleys: x2 mins into flex and abduction   Ball into flexion and abduction x 5 each  Manual Therapy: STM in supine to Rt chest wall and pectoralis insertion   MFR to Rt axilla during P/ROM P/ROM in supine to Rt shoulder to per tolerance into flexion,abduction with pt able to achieve full ROM  10/16/21 Therapeutic Exs:  Pulleys: x2 mins into flex and abduction     Manual Therapy: STM in supine to Rt chest wall and pectoralis insertion   MFR to Rt axilla during P/ROM P/ROM in supine to Rt shoulder to per tolerance into flexion,abduction with pt able to achieve full ROM   PATIENT EDUCATION:  Education details: updated HEP, POC Person educated: Patient Education method: Explanation, Demonstration, Tactile cues, and Handouts Education comprehension: verbalized understanding, returned demonstration, verbal cues required, tactile cues required, and needs further education  HOME EXERCISE PROGRAM: Reviewed previously given post op HEP with changes as needed: Access Code: [email or web address] URL: [email or web address] Date: 07/18/2021 Prepared by: Shan Levans  Exercises - Supine Shoulder Flexion AAROM with Hands Clasped - 1-2 x daily - 7 x weekly - 1 sets - 10 reps - 5 second hold - Supine Chest Stretch with Elbows Bent - 1 x daily - 7 x weekly - 1 sets - 3 reps - 30-60seconds hold - Standing Shoulder Abduction AAROM with Dowel - 1-2 x daily - 7 x weekly - 1 sets - 10 reps - 5 second hold - Supine Thoracic Mobilization Towel Roll Vertical with Arm Stretch - 2 x daily - 7 x weekly - 1 sets - 1 reps - 20-30 seconds hold  ASSESSMENT CLINICAL IMPRESSION: Pt is doing very well.  She has almost reached baseline AROM and will schedule out until DIEP flap in October.    Pt will benefit from skilled therapeutic intervention to improve on the following deficits: Decreased knowledge of precautions, impaired UE  functional use, pain, decreased ROM, postural dysfunction.   PT treatment/interventions: ADL/Self care home management, Therapeutic exercises, Patient/Family education, and Manual therapy   GOALS: Goals reviewed with patient? Yes  LONG TERM GOALS:  (STG=LTG)  GOALS Name Target Date  Goal status  1 Pt will demonstrate she has regained full shoulder ROM and function post operatively compared to baselines.  Baseline: 08/15/2021 IN PROGRESS  2 Pt will be educated on lymphedema risk reduction- ABC class - and SOZO surveillance 08/15/21 INITIAL  3 Pt will feel ind with continued HEP 08/15/21 INITIAL          PLAN: PT FREQUENCY/DURATION: 1x per week for 6 weeks  PLAN FOR NEXT SESSION: continue MFR and STM to R pec and cording AAROM activities    Stark Bray, PT 11/06/2021, 8:55 AM

## 2021-11-08 ENCOUNTER — Other Ambulatory Visit: Payer: Self-pay

## 2021-11-13 ENCOUNTER — Ambulatory Visit: Payer: BC Managed Care – PPO | Attending: General Surgery | Admitting: Rehabilitation

## 2021-11-13 ENCOUNTER — Encounter: Payer: Self-pay | Admitting: Rehabilitation

## 2021-11-13 DIAGNOSIS — M25611 Stiffness of right shoulder, not elsewhere classified: Secondary | ICD-10-CM | POA: Insufficient documentation

## 2021-11-13 DIAGNOSIS — R293 Abnormal posture: Secondary | ICD-10-CM | POA: Diagnosis present

## 2021-11-13 DIAGNOSIS — Z483 Aftercare following surgery for neoplasm: Secondary | ICD-10-CM | POA: Diagnosis present

## 2021-11-13 DIAGNOSIS — C50611 Malignant neoplasm of axillary tail of right female breast: Secondary | ICD-10-CM | POA: Insufficient documentation

## 2021-11-13 DIAGNOSIS — Z171 Estrogen receptor negative status [ER-]: Secondary | ICD-10-CM | POA: Insufficient documentation

## 2021-11-13 NOTE — Therapy (Signed)
OUTPATIENT PHYSICAL THERAPY BREAST CANCER TREATMENT NOTE   Patient Name: Rachel [name] MRN: [id number] DOB:October 12, 1965, 56 y.o., female Today's Date: 11/13/2021   PT End of Session - 11/13/21 0851     Visit Number 15    Number of Visits 19    Date for PT Re-Evaluation 12/11/21    PT Start Time 0800    PT Stop Time 0849    PT Time Calculation (min) 49 min    Activity Tolerance Patient tolerated treatment well    Behavior During Therapy [facility] for tasks assessed/performed                     Past Medical History:  Diagnosis Date   ASCUS of cervix with negative high risk HPV 03/2018   Bone pain due to G-CSF 08/23/2021   Cancer ([place]) 2001   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  Prior T score -2.5.  DEXA stable from prior study.   Personal history of chemotherapy 2001   Personal history of radiation therapy 2001   Pneumonia    Had 12-2017   PONV (postoperative nausea and vomiting)    Past Surgical History:  Procedure Laterality Date   BREAST EXCISIONAL BIOPSY Left 2021   BREAST LUMPECTOMY Right 2001   CESAREAN SECTION  1991   COLONOSCOPY     MASTECTOMY W/ SENTINEL NODE BIOPSY Right 06/25/2021   Procedure: RIGHT MASTECTOMY WITH SENTINEL LYMPH NODE BIOPSY;  Surgeon: Stark Klein, MD;  Location: [place];  Service: General;  Laterality: Right;   NASAL SINUS SURGERY Right 02/20/2020   Procedure: ENDOSCOPIC SINUS SURGERY WITH RIGHT ETHMOIDECTOMY AND RIGHT MAXILLARY OSTIA ENLARGEMENT WITH FUSION;  Surgeon: Rozetta Nunnery, MD;  Location: [place];  Service: ENT;  Laterality: Right;   PELVIC LAPAROSCOPY     endometriosis   PORTACATH PLACEMENT N/A 08/07/2021    Procedure: PORT PLACEMENT WITH ULTRASOUND GUIDANCE;  Surgeon: Stark Klein, MD;  [facility];  Service: General;  Laterality: N/A;   SINUS ENDO WITH FUSION Right 02/20/2020   Procedure: SINUS ENDO WITH FUSION;  Surgeon: Rozetta Nunnery, MD;  Location: [place];  Service: ENT;  Laterality: Right;   [place]   Patient Active Problem List   Diagnosis Date Noted   Bone pain due to G-CSF 08/23/2021   Cancer of overlapping sites of right female breast ([place]) 06/25/2021   Lateral epicondylitis, left elbow 03/04/2021   Left wrist pain 06/24/2019   Pain in joint of left shoulder 06/24/2019   Pain in joint of left elbow 06/24/2019   Family history of uterine cancer    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    PERTINENT HISTORY:  Patient was diagnosed on 06/03/21 with right breast cancer recurrence. Initial lumpectomy, SLNB x 3, radiation, chemotherapy, and antiestrogens. Rt mastectomy on 06/25/21 with Dr. Barry Dienes with another SLNB  0/3 positive lymph nodes.  Other hx: chiari malformation   PATIENT GOALS:  Reassess how my recovery is going related to arm function, pain, and swelling.  PAIN:  Are you having pain? No, just tightness in the arm and across the chest  PRECAUTIONS: Recent Surgery, right UE Lymphedema risk, 6 total lymph nodes removed x 2 breast cancers OBJECTIVE:  PATIENT SURVEYS:  QUICK DASH: 80%  OBSERVATIONS:  Very well healed very flat mastectomy incision. No edema or redness  Pectoralis axillary border tight with spasm during Abduction  POSTURE:  Slightly guarded  LYMPHEDEMA ASSESSMENT:   UPPER EXTREMITY AROM/PROM:   A/PROM RIGHT  06/17/2021   07/18/21 07/30/21 08/06/21 09/10/21 10/16/21 11/06/21  Shoulder extension 53    50    Shoulder flexion 170 145 - feeling tight 155 158 - pull in axilla 160 - pull in the axilla 165 - pull in axilla  170  Shoulder abduction 170 114 130 - pectoralis muscle into chest 115 - pull in the pectoralis - some cording now noted  130 - pulling down arm  150 - pulling down the arm  168  Shoulder internal rotation          Shoulder external rotation 90    90                            (Blank rows = not tested)   CERVICAL AROM: All within normal limits:    LYMPHEDEMA ASSESSMENTS:    [facility] RIGHT  06/17/2021 6/8/23Rt 6/8/23Lt  10 cm proximal to olecranon process 36.5 36.5   Olecranon process 23.5 23.5   10 cm proximal to ulnar styloid process 20.6 20.6   Just proximal to ulnar styloid process 15.3 15.3   Across hand at thumb web space 18 18   At base of 2nd digit 5.8 5.8   (Blank rows = not tested)  TODAY'S TREATMENT: 11/13/21 Therapeutic Exs:   Ball into flexion and abduction x 5 each  Shoulder stretch x 20sec  Tricep stretch 2# x 10: bicep curl, scaption, bent over row, tricep extension, chest press supine, bridge 2x10, quadruped alt UE/LE x 10 with cueing for performance and to not let a glass of water fall off the back - harder with Rt arm holding the weight     LTR with goal post arms 6" x 5 each   Manual Therapy: STM in supine to Rt chest wall and pectoralis insertion   MFR to Rt axilla during P/ROM P/ROM in supine to Rt shoulder to per tolerance into flexion,abduction with pt able to achieve full ROM  11/06/21 Therapeutic Exs:  Pulleys: x2 mins into flex and abduction   Ball into flexion and abduction x 5 each  LTR with goal post arms 6" x 5 each   Manual Therapy: STM in supine to Rt chest wall and pectoralis insertion   MFR to Rt axilla during P/ROM P/ROM in supine to Rt shoulder to per tolerance into flexion,abduction with pt able to achieve full ROM  10/23/21 Therapeutic Exs:  Pulleys: x2 mins into flex and abduction   Ball into flexion and abduction x 5 each  Manual Therapy: STM in supine to Rt chest wall and pectoralis insertion   MFR to Rt axilla during P/ROM P/ROM in supine to Rt shoulder to per tolerance into flexion,abduction with pt able to achieve full ROM   PATIENT EDUCATION:  Education details: updated HEP, POC Person educated: Patient Education method: Explanation, Demonstration, Tactile cues, and Handouts Education comprehension: verbalized understanding, returned demonstration, verbal cues required, tactile cues required, and needs further education  HOME EXERCISE PROGRAM: Reviewed previously given post op HEP with changes as needed: Access Code: [email or web address] URL: [email or web address] Date: 07/18/2021 Prepared by: Shan Levans  Exercises - Supine Shoulder Flexion AAROM with Hands Clasped - 1-2 x daily - 7 x weekly - 1 sets - 10 reps - 5 second hold - Supine Chest Stretch with Elbows Bent - 1 x daily - 7 x weekly - 1 sets - 3 reps - 30-60seconds hold - Standing Shoulder Abduction AAROM with Dowel - 1-2 x daily - 7 x weekly - 1 sets - 10 reps - 5 second hold - Supine Thoracic Mobilization Towel Roll Vertical with Arm Stretch - 2 x daily - 7 x weekly - 1 sets - 1 reps - 20-30 seconds  hold  ASSESSMENT CLINICAL IMPRESSION: Pt felt much improved today.  Less of a band at the pectoralis in overhead and no cording felt.  Added some TE to prepare for DIEP.    Pt will benefit from skilled therapeutic intervention to improve on the following deficits: Decreased knowledge of precautions, impaired UE functional use, pain, decreased ROM, postural dysfunction.   PT treatment/interventions: ADL/Self care home management, Therapeutic exercises, Patient/Family education, and Manual therapy   GOALS: Goals reviewed with patient? Yes  LONG TERM GOALS:  (STG=LTG)  GOALS Name Target Date  Goal status  1 Pt will demonstrate she has regained full shoulder ROM and function post operatively compared to baselines.  Baseline: 12/11/21 IN PROGRESS  2 Pt will be educated on lymphedema risk reduction- ABC class - and SOZO surveillance 12/11/21 MET  3 Pt will feel ind with continued HEP and prepared for DIEP flap surgery 12/11/21 IN PROGRESS          PLAN: PT FREQUENCY/DURATION: 1x per week for 6 weeks  PLAN FOR NEXT  SESSION: continue MFR and STM to R pec and cording AAROM activities    Stark Bray, PT 11/13/2021, 8:54 AM

## 2021-11-20 ENCOUNTER — Encounter: Payer: Self-pay | Admitting: Physical Therapy

## 2021-11-20 ENCOUNTER — Ambulatory Visit: Payer: BC Managed Care – PPO | Admitting: Physical Therapy

## 2021-11-20 DIAGNOSIS — R293 Abnormal posture: Secondary | ICD-10-CM

## 2021-11-20 DIAGNOSIS — Z171 Estrogen receptor negative status [ER-]: Secondary | ICD-10-CM

## 2021-11-20 DIAGNOSIS — Z483 Aftercare following surgery for neoplasm: Secondary | ICD-10-CM

## 2021-11-20 DIAGNOSIS — M25611 Stiffness of right shoulder, not elsewhere classified: Secondary | ICD-10-CM | POA: Diagnosis not present

## 2021-11-20 NOTE — Therapy (Signed)
OUTPATIENT PHYSICAL THERAPY BREAST CANCER TREATMENT NOTE   Patient Name: Rachel [name] MRN: [id number] DOB:02-05-1966, 56 y.o., female Today's Date: 11/20/2021   PT End of Session - 11/20/21 0802     Visit Number 16    Number of Visits 19    Date for PT Re-Evaluation 12/11/21    PT Start Time 0801    PT Stop Time 7619    PT Time Calculation (min) 53 min    Activity Tolerance Patient tolerated treatment well    Behavior During Therapy [facility] for tasks assessed/performed                     Past Medical History:  Diagnosis Date   ASCUS of cervix with negative high risk HPV 03/2018   Bone pain due to G-CSF 08/23/2021   Cancer ([place]) 2001   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  Prior T score -2.5.  DEXA stable from prior study.   Personal history of chemotherapy 2001   Personal history of radiation therapy 2001   Pneumonia    Had 12-2017   PONV (postoperative nausea and vomiting)    Past Surgical History:  Procedure Laterality Date   BREAST EXCISIONAL BIOPSY Left 2021   BREAST LUMPECTOMY Right 2001   CESAREAN SECTION  1991   COLONOSCOPY     MASTECTOMY W/ SENTINEL NODE BIOPSY Right 06/25/2021   Procedure: RIGHT MASTECTOMY WITH SENTINEL LYMPH NODE BIOPSY;  Surgeon: Stark Klein, MD;  Location: [place];  Service: General;  Laterality: Right;   NASAL SINUS SURGERY Right 02/20/2020   Procedure: ENDOSCOPIC SINUS SURGERY WITH RIGHT ETHMOIDECTOMY AND RIGHT MAXILLARY OSTIA ENLARGEMENT WITH FUSION;  Surgeon: Rozetta Nunnery, MD;  Location: [place];  Service: ENT;  Laterality: Right;   PELVIC LAPAROSCOPY     endometriosis   PORTACATH PLACEMENT N/A 08/07/2021    Procedure: PORT PLACEMENT WITH ULTRASOUND GUIDANCE;  Surgeon: Stark Klein, MD;  Location[facility];  Service: General;  Laterality: N/A;   SINUS ENDO WITH FUSION Right 02/20/2020   Procedure: SINUS ENDO WITH FUSION;  Surgeon: Rozetta Nunnery, MD;  Location: [place];  Service: ENT;  Laterality: Right;   [place]   Patient Active Problem List   Diagnosis Date Noted   Bone pain due to G-CSF 08/23/2021   Cancer of overlapping sites of right female breast ([place]) 06/25/2021   Lateral epicondylitis, left elbow 03/04/2021   Left wrist pain 06/24/2019   Pain in joint of left shoulder 06/24/2019   Pain in joint of left elbow 06/24/2019   Family history of uterine cancer    Malignant neoplasm of axillary tail of right breast in female, estrogen receptor negative ([place]) 03/28/2016   Breast CA ([place]) 03/31/2011   ASCUS (atypical squamous cells of undetermined significance) on Pap smear    Hypothyroidism    Osteopenia    Hypercholesteremia     PCP:  Jenna Luo, MD  REFERRING PROVIDER: Dr. Barry Dienes  REFERRING DIAG: Rt breast cancer  THERAPY DIAG:  No diagnosis  found.  Rationale for Evaluation and Treatment Rehabilitation  ONSET DATE: 06/03/21  SUBJECTIVE:                                                                                                                                                                                         SUBJECTIVE STATEMENT: Patient stated that she finished her treatment and she is feeling good.  PERTINENT HISTORY:  Patient was diagnosed on 06/03/21 with right breast cancer recurrence. Initial lumpectomy, SLNB x 3, radiation, chemotherapy, and antiestrogens. Rt mastectomy on 06/25/21 with Dr. Barry Dienes with another SLNB 0/3 positive lymph nodes.  Other hx: chiari malformation   PATIENT GOALS:  Reassess how my recovery is going related to arm function, pain, and  swelling.  PAIN:  Are you having pain? No, just tightness in the arm and across the chest  PRECAUTIONS: Recent Surgery, right UE Lymphedema risk, 6 total lymph nodes removed x 2 breast cancers OBJECTIVE:  PATIENT SURVEYS:  QUICK DASH: 80%  OBSERVATIONS:  Very well healed very flat mastectomy incision. No edema or redness  Pectoralis axillary border tight with spasm during Abduction  POSTURE:  Slightly guarded  LYMPHEDEMA ASSESSMENT:   UPPER EXTREMITY AROM/PROM:   A/PROM RIGHT  06/17/2021   07/18/21 07/30/21 08/06/21 09/10/21 10/16/21 11/06/21  Shoulder extension 53    50    Shoulder flexion 170 145 - feeling tight 155 158 - pull in axilla 160 - pull in the axilla 165 - pull in axilla  170  Shoulder abduction 170 114 130 - pectoralis muscle into chest 115 - pull in the pectoralis - some cording now noted  130 - pulling down arm  150 - pulling down the arm  168  Shoulder internal rotation          Shoulder external rotation 90    90                            (Blank rows = not tested)   CERVICAL AROM: All within normal limits:    LYMPHEDEMA ASSESSMENTS:    [facility] RIGHT  06/17/2021 6/8/23Rt 6/8/23Lt  10 cm proximal to olecranon process 36.5 36.5   Olecranon process 23.5 23.5   10 cm proximal to ulnar styloid process 20.6 20.6   Just proximal to ulnar styloid process 15.3 15.3   Across hand at thumb web space 18 18   At base of 2nd digit 5.8 5.8   (Blank rows = not tested)  TODAY'S TREATMENT:  11/20/21 Therapeutic Exs: Rows   Ball into flexion and abduction x 5 each Quadruped opposite arm opposite leg x  10 with cueing  Pelvic tilts x 10 reps with v/c to move slowly  Pelvic tilts with marching x 10 reps  Pelvic tilt with foot taps x 10 reps  Standing marches while holding 2 lb weights Standing holding 2 lb weights with unilateral hip flexion and trunk rotation x 10 reps  Dual cross: right scap retraction with R march x 8 then repeated on opposite side  Manual Therapy: STM  in supine to Rt chest wall and pectoralis insertion   MFR to Rt axilla during P/ROM P/ROM in supine to Rt shoulder to per tolerance into flexion,abduction with pt able to achieve full ROM  11/13/21 Therapeutic Exs:   Ball into flexion and abduction x 5 each  Shoulder stretch x 20sec  Tricep stretch 2# x 10: bicep curl, scaption, bent over row, tricep extension, chest press supine, bridge 2x10, quadruped alt UE/LE x 10 with cueing for performance and to not let a glass of water fall off the back - harder with Rt arm holding the weight    LTR with goal post arms 6" x 5 each   Manual Therapy: STM in supine to Rt chest wall and pectoralis insertion   MFR to Rt axilla during P/ROM P/ROM in supine to Rt shoulder to per tolerance into flexion,abduction with pt able to achieve full ROM  11/06/21 Therapeutic Exs:  Pulleys: x2 mins into flex and abduction   Ball into flexion and abduction x 5 each  LTR with goal post arms 6" x 5 each   Manual Therapy: STM in supine to Rt chest wall and pectoralis insertion   MFR to Rt axilla during P/ROM P/ROM in supine to Rt shoulder to per tolerance into flexion,abduction with pt able to achieve full ROM  10/23/21 Therapeutic Exs:  Pulleys: x2 mins into flex and abduction   Ball into flexion and abduction x 5 each  Manual Therapy: STM in supine to Rt chest wall and pectoralis insertion   MFR to Rt axilla during P/ROM P/ROM in supine to Rt shoulder to per tolerance into flexion,abduction with pt able to achieve full ROM   PATIENT EDUCATION:  Education details: updated HEP, POC Person educated: Patient Education method: Explanation, Demonstration, Tactile cues, and Handouts Education comprehension: verbalized understanding, returned demonstration, verbal cues required, tactile cues required, and needs further education  HOME EXERCISE PROGRAM: Reviewed previously given post op HEP with changes as needed: Access Code: [email or web address] URL:  [email or web address] Date: 07/18/2021 Prepared by: Shan Levans  Exercises - Supine Shoulder Flexion AAROM with Hands Clasped - 1-2 x daily - 7 x weekly - 1 sets - 10 reps - 5 second hold - Supine Chest Stretch with Elbows Bent - 1 x daily - 7 x weekly - 1 sets - 3 reps - 30-60seconds hold - Standing Shoulder Abduction AAROM with Dowel - 1-2 x daily - 7 x weekly - 1 sets - 10 reps - 5 second hold - Supine Thoracic Mobilization Towel Roll Vertical with Arm Stretch - 2 x daily - 7 x weekly - 1 sets - 1 reps - 20-30 seconds hold  ASSESSMENT CLINICAL IMPRESSION: Pt responded well to therapy today. Today's session focused on TA and rectus abdominal activation. Patient demonstrated great coordination with the more challenging core exercises and had minor difficulty with rotational activities. Her cording is improving and was not visible today but was palpable mostly at antecubital fossa but improved after PROM.   Pt will benefit from skilled therapeutic intervention to improve on the following  deficits: Decreased knowledge of precautions, impaired UE functional use, pain, decreased ROM, postural dysfunction.   PT treatment/interventions: ADL/Self care home management, Therapeutic exercises, Patient/Family education, and Manual therapy   GOALS: Goals reviewed with patient? Yes  LONG TERM GOALS:  (STG=LTG)  GOALS Name Target Date  Goal status  1 Pt will demonstrate she has regained full shoulder ROM and function post operatively compared to baselines.  Baseline: 12/11/21 IN PROGRESS  2 Pt will be educated on lymphedema risk reduction- ABC class - and SOZO surveillance 12/11/21 MET  3 Pt will feel ind with continued HEP and prepared for DIEP flap surgery 12/11/21 IN PROGRESS          PLAN: PT FREQUENCY/DURATION: 1x per week for 6 weeks  PLAN FOR NEXT SESSION: continue MFR and STM to R pec and cording AAROM activities    [employer], PT 11/20/2021, 8:56 AM

## 2021-11-22 ENCOUNTER — Other Ambulatory Visit: Payer: Self-pay | Admitting: Family Medicine

## 2021-11-22 MED ORDER — AMOXICILLIN-POT CLAVULANATE 875-125 MG PO TABS: 1.0000 | ORAL_TABLET | Freq: Two times a day (BID) | ORAL | 0 refills | Status: DC

## 2021-11-27 ENCOUNTER — Ambulatory Visit: Payer: BC Managed Care – PPO | Admitting: Rehabilitation

## 2021-11-27 ENCOUNTER — Ambulatory Visit: Payer: BC Managed Care – PPO | Admitting: Hematology & Oncology

## 2021-11-27 ENCOUNTER — Inpatient Hospital Stay: Payer: BC Managed Care – PPO

## 2021-11-27 ENCOUNTER — Other Ambulatory Visit: Payer: BC Managed Care – PPO

## 2021-11-27 DIAGNOSIS — C50611 Malignant neoplasm of axillary tail of right female breast: Secondary | ICD-10-CM

## 2021-11-27 DIAGNOSIS — M25611 Stiffness of right shoulder, not elsewhere classified: Secondary | ICD-10-CM | POA: Diagnosis not present

## 2021-11-27 DIAGNOSIS — R293 Abnormal posture: Secondary | ICD-10-CM

## 2021-11-27 DIAGNOSIS — Z483 Aftercare following surgery for neoplasm: Secondary | ICD-10-CM

## 2021-11-27 NOTE — Therapy (Signed)
OUTPATIENT PHYSICAL THERAPY BREAST CANCER TREATMENT NOTE   Patient Name: Rachel [name] MRN: [id number] DOB:[birth year], 56 y.o., female Today's Date: 11/27/2021   PT End of Session - 11/27/21 1504     Visit Number 17    Number of Visits 19    Date for PT Re-Evaluation 12/11/21    PT Start Time 1504    PT Stop Time 1550    PT Time Calculation (min) 46 min    Activity Tolerance Patient tolerated treatment well    Behavior During Therapy [facility] for tasks assessed/performed                      Past Medical History:  Diagnosis Date   ASCUS of cervix with negative high risk HPV 03/2018   Bone pain due to G-CSF 08/23/2021   Cancer ([place]) 2001   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  Prior T score -2.5.  DEXA stable from prior study.   Personal history of chemotherapy 2001   Personal history of radiation therapy 2001   Pneumonia    Had 12-2017   PONV (postoperative nausea and vomiting)    Past Surgical History:  Procedure Laterality Date   BREAST EXCISIONAL BIOPSY Left 2021   BREAST LUMPECTOMY Right 2001   CESAREAN SECTION  1991   COLONOSCOPY     MASTECTOMY W/ SENTINEL NODE BIOPSY Right 06/25/2021   Procedure: RIGHT MASTECTOMY WITH SENTINEL LYMPH NODE BIOPSY;  Surgeon: Stark Klein, MD;  Location: [place];  Service: General;  Laterality: Right;   NASAL SINUS SURGERY Right 02/20/2020   Procedure: ENDOSCOPIC SINUS SURGERY WITH RIGHT ETHMOIDECTOMY AND RIGHT MAXILLARY OSTIA ENLARGEMENT WITH FUSION;  Surgeon: Rozetta Nunnery, MD;  Location: [place];  Service: ENT;  Laterality: Right;   PELVIC LAPAROSCOPY     endometriosis   PORTACATH PLACEMENT N/A  08/07/2021   Procedure: PORT PLACEMENT WITH ULTRASOUND GUIDANCE;  Surgeon: Stark Klein, MD;  [facility];  Service: General;  Laterality: N/A;   SINUS ENDO WITH FUSION Right 02/20/2020   Procedure: SINUS ENDO WITH FUSION;  Surgeon: Rozetta Nunnery, MD;  Location: [place];  Service: ENT;  Laterality: Right;   [place]   Patient Active Problem List   Diagnosis Date Noted   Bone pain due to G-CSF 08/23/2021   Cancer of overlapping sites of right female breast ([place]) 06/25/2021   Lateral epicondylitis, left elbow 03/04/2021   Left wrist pain 06/24/2019   Pain in joint of left shoulder 06/24/2019   Pain in joint of left elbow 06/24/2019   Family history of uterine cancer    Malignant 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 Barry Dienes  REFERRING DIAG: Rt breast cancer  THERAPY DIAG:  No  diagnosis found.  Rationale for Evaluation and Treatment Rehabilitation  ONSET DATE: 06/03/21  SUBJECTIVE:                                                                                                                                                                                         SUBJECTIVE STATEMENT: She did haven't any extra soreness. On Friday, she a muscle spasm in the right pec and had to take a muscle relaxer. She was sure what activities could have caused it to happen.  PERTINENT HISTORY:  Patient was diagnosed on 06/03/21 with right breast cancer recurrence. Initial lumpectomy, SLNB x 3, radiation, chemotherapy, and antiestrogens. Rt mastectomy on 06/25/21 with Dr. Barry Dienes with another SLNB 0/3 positive lymph nodes.  Other hx:  chiari malformation   PATIENT GOALS:  Reassess how my recovery is going related to arm function, pain, and swelling.  PAIN:  Are you having pain? No, just tightness in the arm and across the chest  PRECAUTIONS: Recent Surgery, right UE Lymphedema risk, 6 total lymph nodes removed x 2 breast cancers OBJECTIVE:  PATIENT SURVEYS:  QUICK DASH: 80%  OBSERVATIONS:  Very well healed very flat mastectomy incision. No edema or redness  Pectoralis axillary border tight with spasm during Abduction  POSTURE:  Slightly guarded  LYMPHEDEMA ASSESSMENT:   UPPER EXTREMITY AROM/PROM:   A/PROM RIGHT  06/17/2021   07/18/21 07/30/21 08/06/21 09/10/21 10/16/21 11/06/21  Shoulder extension 53    50    Shoulder flexion 170 145 - feeling tight 155 158 - pull in axilla 160 - pull in the axilla 165 - pull in axilla  170  Shoulder abduction 170 114 130 - pectoralis muscle into chest 115 - pull in the pectoralis - some cording now noted  130 - pulling down arm  150 - pulling down the arm  168  Shoulder internal rotation          Shoulder external rotation 90    90                            (Blank rows = not tested)   CERVICAL AROM: All within normal limits:    LYMPHEDEMA ASSESSMENTS:    [facility] RIGHT  06/17/2021 6/8/23Rt 6/8/23Lt  10 cm proximal to olecranon process 36.5 36.5   Olecranon process 23.5 23.5   10 cm proximal to ulnar styloid process 20.6 20.6   Just proximal to ulnar styloid process 15.3 15.3   Across hand at thumb web space 18 18   At base of 2nd digit 5.8 5.8   (Blank rows = not tested)  TODAY'S TREATMENT:  11/20/21 Therapeutic Exs: Rows   Ball into flexion and abduction x 10 each  Quadruped opposite arm Quadruped opposite arm opposite leg x 10 with cueing  Pelvic tilts x 10 reps with v/c to move slowly  Pelvic tilts with marching x 10 reps  Pelvic tilt with foot taps x 10 reps  [place] with yellow theraband 1x10  Standing marches while holding 2 lb weights Standing holding 2 lb  weights with unilateral hip flexion and trunk rotation x 10 reps  Manual Therapy: STM in supine to Rt chest wall and pectoralis insertion   MFR to Rt axilla during P/ROM P/ROM in supine to Rt shoulder to per tolerance into flexion,abduction with pt able to achieve full ROM  11/13/21 Therapeutic Exs:   Ball into flexion and abduction x 5 each  Shoulder stretch x 20sec  Tricep stretch 2# x 10: bicep curl, scaption, bent over row, tricep extension, chest press supine, bridge 2x10, quadruped alt UE/LE x 10 with cueing for performance and to not let a glass of water fall off the back - harder with Rt arm holding the weight    LTR with goal post arms 6" x 5 each   Manual Therapy: STM in supine to Rt chest wall and pectoralis insertion   MFR to Rt axilla during P/ROM P/ROM in supine to Rt shoulder to per tolerance into flexion,abduction with pt able to achieve full ROM  11/06/21 Therapeutic Exs:  Pulleys: x2 mins into flex and abduction   Ball into flexion and abduction x 5 each  LTR with goal post arms 6" x 5 each   Manual Therapy: STM in supine to Rt chest wall and pectoralis insertion   MFR to Rt axilla during P/ROM P/ROM in supine to Rt shoulder to per tolerance into flexion,abduction with pt able to achieve full ROM  10/23/21 Therapeutic Exs:  Pulleys: x2 mins into flex and abduction   Ball into flexion and abduction x 5 each  Manual Therapy: STM in supine to Rt chest wall and pectoralis insertion   MFR to Rt axilla during P/ROM P/ROM in supine to Rt shoulder to per tolerance into flexion,abduction with pt able to achieve full ROM   PATIENT EDUCATION:  Education details: updated HEP, POC Person educated: Patient Education method: Explanation, Demonstration, Tactile cues, and Handouts Education comprehension: verbalized understanding, returned demonstration, verbal cues required, tactile cues required, and needs further education  HOME EXERCISE PROGRAM: Reviewed previously given  post op HEP with changes as needed: Access Code: [email or web address] URL: [email or web address] Date: 07/18/2021 Prepared by: Shan Levans  Exercises - Supine Shoulder Flexion AAROM with Hands Clasped - 1-2 x daily - 7 x weekly - 1 sets - 10 reps - 5 second hold - Supine Chest Stretch with Elbows Bent - 1 x daily - 7 x weekly - 1 sets - 3 reps - 30-60seconds hold - Standing Shoulder Abduction AAROM with Dowel - 1-2 x daily - 7 x weekly - 1 sets - 10 reps - 5 second hold - Supine Thoracic Mobilization Towel Roll Vertical with Arm Stretch - 2 x daily - 7 x weekly - 1 sets - 1 reps - 20-30 seconds hold  ASSESSMENT CLINICAL IMPRESSION: Pt responded well to therapy today. Today's session focused on TA and rectus abdominal activation. Patient demonstrated great coordination with the more challenging core exercises and had less difficulty with rotational activities. Pt had some relief in the cord after the manual stretching.   Pt will benefit from  skilled therapeutic intervention to improve on the following deficits: Decreased knowledge of precautions, impaired UE functional use, pain, decreased ROM, postural dysfunction.   PT treatment/interventions: ADL/Self care home management, Therapeutic exercises, Patient/Family education, and Manual therapy   GOALS: Goals reviewed with patient? Yes  LONG TERM GOALS:  (STG=LTG)  GOALS Name Target Date  Goal status  1 Pt will demonstrate she has regained full shoulder ROM and function post operatively compared to baselines.  Baseline: 12/11/21 IN PROGRESS  2 Pt will be educated on lymphedema risk reduction- ABC class - and SOZO surveillance 12/11/21 MET  3 Pt will feel ind with continued HEP and prepared for DIEP flap surgery 12/11/21 IN PROGRESS          PLAN: PT FREQUENCY/DURATION: 1x per week for 6 weeks  PLAN FOR NEXT SESSION: continue MFR and STM to R pec and cording AAROM activities, and core strengthening to prepare for surgery      Hamilton Marinello, Student-PT 11/27/2021, 3:50 PM

## 2021-11-29 ENCOUNTER — Encounter: Payer: Self-pay | Admitting: *Deleted

## 2021-11-29 ENCOUNTER — Inpatient Hospital Stay (HOSPITAL_BASED_OUTPATIENT_CLINIC_OR_DEPARTMENT_OTHER): Payer: BC Managed Care – PPO | Admitting: Hematology & Oncology

## 2021-11-29 ENCOUNTER — Inpatient Hospital Stay: Payer: BC Managed Care – PPO | Attending: Hematology & Oncology

## 2021-11-29 ENCOUNTER — Encounter: Payer: Self-pay | Admitting: Hematology & Oncology

## 2021-11-29 ENCOUNTER — Inpatient Hospital Stay: Payer: BC Managed Care – PPO

## 2021-11-29 VITALS — BP 103/74 | HR 66 | Temp 97.9°F | Resp 20 | Ht 64.0 in | Wt 145.0 lb

## 2021-11-29 DIAGNOSIS — C50811 Malignant neoplasm of overlapping sites of right female breast: Secondary | ICD-10-CM

## 2021-11-29 DIAGNOSIS — Z853 Personal history of malignant neoplasm of breast: Secondary | ICD-10-CM | POA: Diagnosis not present

## 2021-11-29 DIAGNOSIS — Z9011 Acquired absence of right breast and nipple: Secondary | ICD-10-CM | POA: Diagnosis not present

## 2021-11-29 DIAGNOSIS — C50611 Malignant neoplasm of axillary tail of right female breast: Secondary | ICD-10-CM

## 2021-11-29 DIAGNOSIS — Z9221 Personal history of antineoplastic chemotherapy: Secondary | ICD-10-CM | POA: Diagnosis not present

## 2021-11-29 DIAGNOSIS — Z170 Estrogen receptor positive status [ER+]: Secondary | ICD-10-CM | POA: Diagnosis not present

## 2021-11-29 DIAGNOSIS — Z95828 Presence of other vascular implants and grafts: Secondary | ICD-10-CM

## 2021-11-29 DIAGNOSIS — C50911 Malignant neoplasm of unspecified site of right female breast: Secondary | ICD-10-CM | POA: Diagnosis present

## 2021-11-29 DIAGNOSIS — Z171 Estrogen receptor negative status [ER-]: Secondary | ICD-10-CM | POA: Diagnosis not present

## 2021-11-29 LAB — CBC WITH DIFFERENTIAL (CANCER CENTER ONLY)
Abs Immature Granulocytes: 0.01 10*3/uL (ref 0.00–0.07)
Basophils Absolute: 0.1 10*3/uL (ref 0.0–0.1)
Basophils Relative: 1 %
Eosinophils Absolute: 0.5 10*3/uL (ref 0.0–0.5)
Eosinophils Relative: 9 %
HCT: 37.1 % (ref 36.0–46.0)
Hemoglobin: 12.3 g/dL (ref 12.0–15.0)
Immature Granulocytes: 0 %
Lymphocytes Relative: 45 %
Lymphs Abs: 2.4 10*3/uL (ref 0.7–4.0)
MCH: 30.8 pg (ref 26.0–34.0)
MCHC: 33.2 g/dL (ref 30.0–36.0)
MCV: 93 fL (ref 80.0–100.0)
Monocytes Absolute: 0.4 10*3/uL (ref 0.1–1.0)
Monocytes Relative: 8 %
Neutro Abs: 2 10*3/uL (ref 1.7–7.7)
Neutrophils Relative %: 37 %
Platelet Count: 225 10*3/uL (ref 150–400)
RBC: 3.99 MIL/uL (ref 3.87–5.11)
RDW: 14.1 % (ref 11.5–15.5)
WBC Count: 5.3 10*3/uL (ref 4.0–10.5)
nRBC: 0 % (ref 0.0–0.2)

## 2021-11-29 LAB — CMP (CANCER CENTER ONLY)
ALT: 14 U/L (ref 0–44)
AST: 16 U/L (ref 15–41)
Albumin: 4.1 g/dL (ref 3.5–5.0)
Alkaline Phosphatase: 80 U/L (ref 38–126)
Anion gap: 6 (ref 5–15)
BUN: 16 mg/dL (ref 6–20)
CO2: 27 mmol/L (ref 22–32)
Calcium: 9.4 mg/dL (ref 8.9–10.3)
Chloride: 107 mmol/L (ref 98–111)
Creatinine: 0.73 mg/dL (ref 0.44–1.00)
GFR, Estimated: >60 mL/min (ref >60)
Glucose, Bld: 105 mg/dL — ABNORMAL HIGH (ref 70–99)
Potassium: 4.2 mmol/L (ref 3.5–5.1)
Sodium: 140 mmol/L (ref 135–145)
Total Bilirubin: 0.5 mg/dL (ref 0.3–1.2)
Total Protein: 6 g/dL — ABNORMAL LOW (ref 6.5–8.1)

## 2021-11-29 LAB — LACTATE DEHYDROGENASE: LDH: 186 U/L (ref 98–192)

## 2021-11-29 MED ORDER — HEPARIN SOD (PORK) LOCK FLUSH 100 UNIT/ML IV SOLN
500.0000 [IU] | Freq: Once | INTRAVENOUS | Status: AC
  Administered 2021-11-29: 500 [IU] via INTRAVENOUS

## 2021-11-29 MED ORDER — SODIUM CHLORIDE 0.9% FLUSH
10.0000 mL | Freq: Once | INTRAVENOUS | Status: AC
  Administered 2021-11-29: 10 mL via INTRAVENOUS

## 2021-11-29 NOTE — Addendum Note (Signed)
Addended by: Lucile Crater on: 11/29/2021 10:24 AM   Modules accepted: Orders

## 2021-11-29 NOTE — Progress Notes (Signed)
Hematology and Oncology Follow Up Visit  Kaelie Henigan [facility] [id number] 1965-11-09 56 y.o. 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  Wiersma is back for followup.  She completed her chemotherapy.  She did well with chemotherapy.  She did have some toxicity but we kept her on schedule and kept the dose on track.  She is going to have her reconstruction down in [place] on November 7.  She had a angiogram of the abdomen pelvis recently.  This all looked okay from my point of view.  She is contemplating the aromatase inhibitor.  I told her because of her high Oncotype score, the chemotherapy with an aromatase inhibitor is clearly the standard of care.  I know that she had a mastectomy.  Even with a mastectomy, she still has a risk of recurrence.  I suspect that her risk is probably going to be less than 10%.  Regardless, I do think that an aromatase inhibitor would be beneficial in the benefits outweigh the risk.  I do not think that we have to start though until after she has her reconstruction.  She has had no cough or shortness of breath.  She has had no change in bowel or bladder habits.  She has had no rashes.  There is been no leg swelling.  Overall, I would say performance status is probably ECOG 1.    Medications:  Current Outpatient Medications:    acetaminophen (TYLENOL) 500 MG tablet, Take 1,000 mg by mouth every 6 (six) hours as needed for moderate pain or mild pain., Disp: , Rfl:    butalbital-acetaminophen-caffeine (FIORICET, ESGIC) 50-325-40 MG per tablet, TAKE 1 TABLET BY MOUTH EVERY 6 HOURS AS NEEDED FOR HEADACHE, Disp: 20 tablet, Rfl: 0   Cholecalciferol (VITAMIN D PO), Take 1,000 Units by mouth daily., Disp: ,  Rfl:    ibuprofen (ADVIL) 200 MG tablet, Take 400-600 mg by mouth every 6 (six) hours as needed for mild pain or moderate pain., Disp: , Rfl:    neomycin-polymyxin b-dexamethasone (MAXITROL) 3.5-10000-0.1 SUSP, Place 1 drop into the left eye 3 (three) times daily., Disp: , Rfl:    UNITHROID 112 MCG tablet, TAKE 1 TABLET BY MOUTH EVERY DAY BEFORE BREAKFAST, Disp: 90 tablet, Rfl: 1   amoxicillin-clavulanate (AUGMENTIN) 875-125 MG tablet, Take 1 tablet by mouth 2 (two) times daily. (Patient not taking: Reported on 11/29/2021), Disp: 20 tablet, Rfl: 0   diphenoxylate-atropine (LOMOTIL) 2.5-0.025 MG tablet, Take 1 tablet by mouth 4 (four) times daily as needed for diarrhea or loose stools. (Patient not taking: Reported on 11/29/2021), Disp: 30 tablet, Rfl: 0   fluticasone (FLONASE) 50 MCG/ACT nasal spray, INHALE 2 SPRASY IN EACH NOSTRIL ONCE DAILY (Patient not taking: Reported on 11/29/2021), Disp: 16 g, Rfl: 11   gabapentin (NEURONTIN) 100 MG capsule, Take 1 capsule (100 mg total) by mouth 2 (two) times daily. (Patient not taking: Reported on 11/29/2021), Disp: 30 capsule, Rfl: 1   LORazepam (ATIVAN) 0.5 MG tablet, Take 1 tablet (0.5 mg total) by mouth every 6 (six) hours as needed for anxiety (nausea). (Patient not taking: Reported on 11/29/2021), Disp: 30 tablet, Rfl: 0   magic mouthwash SOLN, Take 5 mLs by mouth 4 (four) times daily as needed for  mouth pain. (Patient not taking: Reported on 11/29/2021), Disp: 240 mL, Rfl: 0   methocarbamol (ROBAXIN) 500 MG tablet, Take 1 tablet (500 mg total) by mouth every 6 (six) hours as needed for muscle spasms. (Patient not taking: Reported on 11/29/2021), Disp: 20 tablet, Rfl: 1   oxyCODONE (OXY IR/ROXICODONE) 5 MG immediate release tablet, Take 1 tablet (5 mg total) by mouth once as needed (for pain score of 1-4). (Patient not taking: Reported on 11/29/2021), Disp: 30 tablet, Rfl: 0   pantoprazole (PROTONIX) 40 MG tablet, TAKE 1 TABLET BY MOUTH EVERY DAY (Patient  not taking: Reported on 11/29/2021), Disp: 90 tablet, Rfl: 3   promethazine (PHENERGAN) 25 MG tablet, TAKE 1 TABLET BY MOUTH EVERY 8 HOURS AS NEEDED FOR NAUSEA/VOMITING (Patient not taking: Reported on 11/29/2021), Disp: 20 tablet, Rfl: 0   rizatriptan (MAXALT) 10 MG tablet, TAKE 1 TABLET BY MOUTH EVERY DAY AS NEEDED FOR MIGRANE (Patient not taking: Reported on 11/29/2021), Disp: 6 tablet, Rfl: 5  Current Facility-Administered Medications:    ondansetron (ZOFRAN) injection 8 mg, 8 mg, Intravenous, Once, Denzel Etienne, Rudell Cobb, MD  Allergies:  Allergies  Allergen Reactions   Anesthetics, Ester Nausea And Vomiting   Erythromycin Nausea And Vomiting    Past Medical History, Surgical history, Social history, and Family History were reviewed and updated.  Review of Systems: Review of Systems  Constitutional: Negative.   HENT:  Positive for congestion.   Eyes: Negative.   Respiratory: Negative.    Cardiovascular: Negative.   Gastrointestinal: Negative.   Genitourinary: Negative.   Musculoskeletal: Negative.   Skin: Negative.   Neurological: Negative.   Endo/Heme/Allergies: Negative.   Psychiatric/Behavioral: Negative.       Physical Exam:  height is _0  (1.626 m) and weight is 145 lb (65.8 kg). Her oral temperature is 97.9 F (36.6 C). Her blood pressure is 103/74 and her pulse is 66. Her respiration is 20 and oxygen saturation is 100%.   Physical Exam Vitals reviewed.  Constitutional:      Comments: Her breast exam shows right mastectomy.  This is well-healed.  There is no nodularity.  There is a little bit of numbness along the mastectomy site.  There is no right axillary adenopathy.  Left breast is unremarkable.  There is no mass in the left breast.  There is no left axillary adenopathy.    HENT:     Head: Normocephalic and atraumatic.  Eyes:     Pupils: Pupils are equal, round, and reactive to light.  Cardiovascular:     Rate and Rhythm: Normal rate and regular rhythm.      Heart sounds: Normal heart sounds.  Pulmonary:     Effort: Pulmonary effort is normal.     Breath sounds: Normal breath sounds.  Abdominal:     General: Bowel sounds are normal.     Palpations: Abdomen is soft.  Musculoskeletal:        General: No tenderness or deformity. Normal range of motion.     Cervical back: Normal range of motion.  Lymphadenopathy:     Cervical: No cervical adenopathy.  Skin:    General: Skin is warm and dry.     Findings: No erythema or rash.  Neurological:     Mental Status: She is alert and oriented to person, place, and time.  Psychiatric:        Behavior: Behavior normal.        Thought Content: Thought content normal.  Judgment: Judgment normal.     Lab Results  Component Value Date   WBC 5.3 11/29/2021   HGB 12.3 11/29/2021   HCT 37.1 11/29/2021   MCV 93.0 11/29/2021   PLT 225 11/29/2021     Chemistry      Component Value Date/Time   NA 140 11/29/2021 0945   NA 140 09/26/2016 0743   NA 143 09/21/2015 0854   K 4.2 11/29/2021 0945   K 3.8 09/26/2016 0743   K 4.4 09/21/2015 0854   CL 107 11/29/2021 0945   CL 104 09/26/2016 0743   CO2 27 11/29/2021 0945   CO2 30 09/26/2016 0743   CO2 28 09/21/2015 0854   BUN 16 11/29/2021 0945   BUN 14 09/26/2016 0743   BUN 13.9 09/21/2015 0854   CREATININE 0.73 11/29/2021 0945   CREATININE 0.87 01/01/2018 1055   CREATININE 0.8 09/21/2015 0854      Component Value Date/Time   CALCIUM 9.4 11/29/2021 0945   CALCIUM 9.4 09/26/2016 0743   CALCIUM 10.1 09/21/2015 0854   ALKPHOS 80 11/29/2021 0945   ALKPHOS 80 09/26/2016 0743   ALKPHOS 98 09/21/2015 0854   AST 16 11/29/2021 0945   AST 37 (H) 09/21/2015 0854   ALT 14 11/29/2021 0945   ALT 35 09/26/2016 0743   ALT 57 (H) 09/21/2015 0854   BILITOT 0.5 11/29/2021 0945   BILITOT 0.53 09/21/2015 0854      Impression and Plan: Ms. Mallery is a 56 year old white female with a history of stage I ductal carcinoma of the right breast. She was  diagnosed 22 years ago. Her tumor was ER positive. I  think that  she is cured.  Now, she has a second breast cancer.  Again I feel this is not a recurrence in the breast.  I feel this is a second breast cancer.  Again, she completed her adjuvant therapy last month.  She will have a reconstruction in 2 weeks or so.  I do think that Femara would be reasonable for her.  I think she has any toxicity from the Femara, we can certainly stop this.  Again, we will hold off on starting the Femara until after she has her reconstruction.  I would like to get her back after Thanksgiving at which point we will readdress starting the Femara.      Volanda Napoleon, MD 10/20/202310:29 AM

## 2021-11-29 NOTE — Progress Notes (Unsigned)
Patient scheduled for surgical reconstruction on 12/17/2021. She will start AI after surgery.   Oncology Nurse Navigator Documentation     11/29/2021   12:30 PM  Oncology Nurse Navigator Flowsheets  Phase of Treatment AI  AI Pending Reason: Oncologist Choice  Navigator Follow Up Date: 01/17/2022  Navigator Follow Up Reason: Follow-up Appointment  Navigator Location [facility]  Navigator Encounter Type Appt/Treatment Plan Review  Patient Visit Type MedOnc  Treatment Phase Post-Tx Follow-up  Barriers/Navigation Needs No Barriers At This Time  Interventions None Required  Acuity Level 1-No Barriers  Support Groups/Services Friends and Family  Time Spent with Patient 15

## 2021-11-30 ENCOUNTER — Other Ambulatory Visit: Payer: Self-pay

## 2021-12-03 ENCOUNTER — Encounter: Payer: Self-pay | Admitting: Hematology & Oncology

## 2021-12-04 ENCOUNTER — Ambulatory Visit: Payer: BC Managed Care – PPO | Admitting: Rehabilitation

## 2021-12-04 ENCOUNTER — Encounter: Payer: Self-pay | Admitting: Rehabilitation

## 2021-12-04 DIAGNOSIS — Z483 Aftercare following surgery for neoplasm: Secondary | ICD-10-CM

## 2021-12-04 DIAGNOSIS — Z171 Estrogen receptor negative status [ER-]: Secondary | ICD-10-CM

## 2021-12-04 DIAGNOSIS — M25611 Stiffness of right shoulder, not elsewhere classified: Secondary | ICD-10-CM

## 2021-12-04 DIAGNOSIS — R293 Abnormal posture: Secondary | ICD-10-CM

## 2021-12-04 NOTE — Therapy (Addendum)
OUTPATIENT PHYSICAL THERAPY BREAST CANCER TREATMENT NOTE   Patient Name: Rachel [name] MRN: [id number] DOB:12-03-1965, 56 y.o., female Today's Date: 12/04/2021   PT End of Session - 12/04/21 0758     Visit Number 18    Number of Visits 19    Date for PT Re-Evaluation 12/11/21    PT Start Time 0758    PT Stop Time 0852    PT Time Calculation (min) 54 min    Activity Tolerance Patient tolerated treatment well    Behavior During Therapy [facility] for tasks assessed/performed                       Past Medical History:  Diagnosis Date   ASCUS of cervix with negative high risk HPV 03/2018   Bone pain due to G-CSF 08/23/2021   Cancer ([place]) 2001   HISTORY OF STAGE I INFILTRATING DUCTAL CARCINOMA OF RIGHT BREAST   Chiari malformation type I ([place])    Chronic headache    Family history of adverse reaction to anesthesia    Family history of uterine cancer    Hypercholesteremia    diet controlled   Hypothyroidism    on meds   Osteopenia 09/2018   T score -2.4.  Prior T score -2.5.  DEXA stable from prior study.   Personal history of chemotherapy 2001   Personal history of radiation therapy 2001   Pneumonia    Had 12-2017   PONV (postoperative nausea and vomiting)    Past Surgical History:  Procedure Laterality Date   BREAST EXCISIONAL BIOPSY Left 2021   BREAST LUMPECTOMY Right 2001   CESAREAN SECTION  1991   COLONOSCOPY     MASTECTOMY W/ SENTINEL NODE BIOPSY Right 06/25/2021   Procedure: RIGHT MASTECTOMY WITH SENTINEL LYMPH NODE BIOPSY;  Surgeon: Stark Klein, MD;  Location: [place];  Service: General;  Laterality: Right;   NASAL SINUS SURGERY Right 02/20/2020   Procedure: ENDOSCOPIC SINUS SURGERY WITH RIGHT ETHMOIDECTOMY AND RIGHT MAXILLARY OSTIA ENLARGEMENT WITH FUSION;  Surgeon: Rozetta Nunnery, MD;  Location: [place];  Service: ENT;  Laterality: Right;   PELVIC LAPAROSCOPY     endometriosis   PORTACATH PLACEMENT N/A  08/07/2021   Procedure: PORT PLACEMENT WITH ULTRASOUND GUIDANCE;  Surgeon: Stark Klein, MD;  Location: [facility];  Service: General;  Laterality: N/A;   SINUS ENDO WITH FUSION Right 02/20/2020   Procedure: SINUS ENDO WITH FUSION;  Surgeon: Rozetta Nunnery, MD;  Location: [place];  Service: ENT;  Laterality: Right;   [place]   Patient Active Problem List   Diagnosis Date Noted   Bone pain due to G-CSF 08/23/2021   Cancer of overlapping sites of right female breast ([place]) 06/25/2021   Lateral epicondylitis, left elbow 03/04/2021   Left wrist pain 06/24/2019   Pain in joint of left shoulder 06/24/2019   Pain in joint of left elbow 06/24/2019   Family history of uterine cancer    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)  Aftercare following surgery for neoplasm  Stiffness of right shoulder, not elsewhere classified  Abnormal posture  Rationale for Evaluation and Treatment Rehabilitation  ONSET DATE: 06/03/21  SUBJECTIVE:                                                                                                                                                                                         SUBJECTIVE STATEMENT: She is still have the muscle spasm in the pec on Saturday. She says stills feel tight in her arm. And the surgery is in two weeks from yesterday.  PERTINENT HISTORY:  Patient was diagnosed on 06/03/21 with right breast cancer recurrence. Initial  lumpectomy, SLNB x 3, radiation, chemotherapy, and antiestrogens. Rt mastectomy on 06/25/21 with Dr. Barry Dienes with another SLNB 0/3 positive lymph nodes.  Other hx: chiari malformation   PATIENT GOALS:  Reassess how my recovery is going related to arm function, pain, and swelling.  PAIN:  Are you having pain? No, just tightness in the arm and across the chest  PRECAUTIONS: Recent Surgery, right UE Lymphedema risk, 6 total lymph nodes removed x 2 breast cancers OBJECTIVE:  PATIENT SURVEYS:  QUICK DASH: 80%  OBSERVATIONS:  Very well healed very flat mastectomy incision. No edema or redness  Pectoralis axillary border tight with spasm during Abduction  POSTURE:  Slightly guarded  LYMPHEDEMA ASSESSMENT:   UPPER EXTREMITY AROM/PROM:   A/PROM RIGHT  06/17/2021   07/18/21 07/30/21 08/06/21 09/10/21 10/16/21 11/06/21  Shoulder extension 53    50    Shoulder flexion 170 145 - feeling tight 155 158 - pull in axilla 160 - pull in the axilla 165 - pull in axilla  170  Shoulder abduction 170 114 130 - pectoralis muscle into chest 115 - pull in the pectoralis - some cording now noted  130 - pulling down arm  150 - pulling down the arm  168  Shoulder internal rotation          Shoulder external rotation 90    90                            (Blank rows = not tested)   CERVICAL AROM: All within normal limits:    LYMPHEDEMA ASSESSMENTS:    [facility] RIGHT  06/17/2021 6/8/23Rt 6/8/23Lt  10 cm proximal to olecranon process 36.5 36.5   Olecranon process 23.5 23.5   10 cm proximal to ulnar styloid process 20.6 20.6   Just proximal to ulnar styloid process 15.3 15.3   Across  hand at thumb web space 18 18   At base of 2nd digit 5.8 5.8   (Blank rows = not tested)  TODAY'S TREATMENT:   12/04/21 Therapeutic Exs: Rows 2 min flexion and abduction   Ball into flexion and abduction x 10 each  Pallof with yellow theraband 1x10  Foam roller pec stretch   Foam roller marches   Foam roller snow angles   Nerve  tension test:   Median +   Ulnar+   Radial -  Nerve glides- median and ulnar   Theraball marches  Theraball kickouts   Manual Therapy:  Myofascial release to lateral trunk with opposite hands STM in supine to intercostals  MFR to Rt axilla during P/ROM P/ROM in supine to Rt shoulder to per tolerance into flexion,abduction with pt able to achieve full ROM  11/20/21 Therapeutic Exs: Rows   Ball into flexion and abduction x 10 each  Quadruped opposite arm Quadruped opposite arm opposite leg x 10 with cueing  Pelvic tilts x 10 reps with v/c to move slowly  Pelvic tilts with marching x 10 reps  Pelvic tilt with foot taps x 10 reps  Pallof with yellow theraband 1x10  Standing marches while holding 2 lb weights Standing holding 2 lb weights with unilateral hip flexion and trunk rotation x 10 reps  Manual Therapy: STM in supine to Rt chest wall and pectoralis insertion   MFR to Rt axilla during P/ROM P/ROM in supine to Rt shoulder to per tolerance into flexion,abduction with pt able to achieve full ROM  11/13/21 Therapeutic Exs:   Ball into flexion and abduction x 5 each  Shoulder stretch x 20sec  Tricep stretch 2# x 10: bicep curl, scaption, bent over row, tricep extension, chest press supine, bridge 2x10, quadruped alt UE/LE x 10 with cueing for performance and to not let a glass of water fall off the back - harder with Rt arm holding the weight    LTR with goal post arms 6" x 5 each   Manual Therapy: STM in supine to Rt chest wall and pectoralis insertion   MFR to Rt axilla during P/ROM P/ROM in supine to Rt shoulder to per tolerance into flexion,abduction with pt able to achieve full ROM  11/06/21 Therapeutic Exs:  Pulleys: x2 mins into flex and abduction   Ball into flexion and abduction x 5 each  LTR with goal post arms 6" x 5 each   Manual Therapy: STM in supine to Rt chest wall and pectoralis insertion   MFR to Rt axilla during P/ROM P/ROM in supine to Rt shoulder to  per tolerance into flexion,abduction with pt able to achieve full ROM  10/23/21 Therapeutic Exs:  Pulleys: x2 mins into flex and abduction   Ball into flexion and abduction x 5 each  Manual Therapy: STM in supine to Rt chest wall and pectoralis insertion   MFR to Rt axilla during P/ROM P/ROM in supine to Rt shoulder to per tolerance into flexion,abduction with pt able to achieve full ROM   PATIENT EDUCATION:  Education details: updated HEP, POC Person educated: Patient Education method: Explanation, Demonstration, Tactile cues, and Handouts Education comprehension: verbalized understanding, returned demonstration, verbal cues required, tactile cues required, and needs further education  HOME EXERCISE PROGRAM: Reviewed previously given post op HEP with changes as needed: Access Code: [email or web address] URL: [email or web address] Date: 07/18/2021 Prepared by: Shan Levans  Exercises - Supine Shoulder Flexion AAROM with Hands Clasped - 1-2 x daily - 7 x weekly -  1 sets - 10 reps - 5 second hold - Supine Chest Stretch with Elbows Bent - 1 x daily - 7 x weekly - 1 sets - 3 reps - 30-60seconds hold - Standing Shoulder Abduction AAROM with Dowel - 1-2 x daily - 7 x weekly - 1 sets - 10 reps - 5 second hold - Supine Thoracic Mobilization Towel Roll Vertical with Arm Stretch - 2 x daily - 7 x weekly - 1 sets - 1 reps - 20-30 seconds hold  Exercises - Standing Median Nerve Glide  - 1 x daily - 7 x weekly - 1 sets - 10 reps - 2 secon hold - Standing Ulnar Nerve Glide  - 1 x daily - 7 x weekly - 1 sets - 10 reps - 2-3 second hold  ASSESSMENT CLINICAL IMPRESSION: Pt responded well to therapy today. Today's session focused on TA and rectus abdominal activation. Pt had a positive nerve tension test for median and ulnar nerves. Pt received a HEP for nerve glides and educated on less is more with nerve glides. Patient demonstrated great coordination with the more challenging core exercises and  had less difficulty with rotational activities. Pt had some relief in the cord after the manual stretching.   Pt will benefit from skilled therapeutic intervention to improve on the following deficits: Decreased knowledge of precautions, impaired UE functional use, pain, decreased ROM, postural dysfunction.   PT treatment/interventions: ADL/Self care home management, Therapeutic exercises, Patient/Family education, and Manual therapy   GOALS: Goals reviewed with patient? Yes  LONG TERM GOALS:  (STG=LTG)  GOALS Name Target Date  Goal status  1 Pt will demonstrate she has regained full shoulder ROM and function post operatively compared to baselines.  Baseline: 12/11/21 IN PROGRESS  2 Pt will be educated on lymphedema risk reduction- ABC class - and SOZO surveillance 12/11/21 MET  3 Pt will feel ind with continued HEP and prepared for DIEP flap surgery 12/11/21 IN PROGRESS          PLAN: PT FREQUENCY/DURATION: 1x per week for 6 weeks  PLAN FOR NEXT SESSION: continue MFR and STM to R pec and cording AAROM activities, and core strengthening to prepare for surgery, SOZO    Truong Delcastillo, Student-PT 12/04/2021, 8:56 AM

## 2021-12-11 ENCOUNTER — Ambulatory Visit: Payer: BC Managed Care – PPO | Attending: General Surgery | Admitting: Rehabilitation

## 2021-12-11 ENCOUNTER — Ambulatory Visit: Payer: Self-pay | Admitting: Rehabilitation

## 2021-12-11 ENCOUNTER — Encounter: Payer: Self-pay | Admitting: Rehabilitation

## 2021-12-11 DIAGNOSIS — Z171 Estrogen receptor negative status [ER-]: Secondary | ICD-10-CM | POA: Diagnosis present

## 2021-12-11 DIAGNOSIS — R293 Abnormal posture: Secondary | ICD-10-CM | POA: Insufficient documentation

## 2021-12-11 DIAGNOSIS — Z483 Aftercare following surgery for neoplasm: Secondary | ICD-10-CM | POA: Insufficient documentation

## 2021-12-11 DIAGNOSIS — C50611 Malignant neoplasm of axillary tail of right female breast: Secondary | ICD-10-CM | POA: Insufficient documentation

## 2021-12-11 DIAGNOSIS — M25611 Stiffness of right shoulder, not elsewhere classified: Secondary | ICD-10-CM | POA: Insufficient documentation

## 2021-12-11 NOTE — Therapy (Addendum)
OUTPATIENT PHYSICAL THERAPY BREAST CANCER TREATMENT NOTE   Patient Name: Rachel [name] MRN: [id number] DOB:Oct 01, 1965, 56 y.o., female Today's Date: 12/11/2021   PT End of Session - 12/11/21 0801     Visit Number 19    Number of Visits 19    Date for PT Re-Evaluation 12/11/21    PT Start Time 0801    PT Stop Time 9702    PT Time Calculation (min) 58 min    Activity Tolerance Patient tolerated treatment well    Behavior During Therapy [facility] for tasks assessed/performed                        Past Medical History:  Diagnosis Date   ASCUS of cervix with negative high risk HPV 03/2018   Bone pain due to G-CSF 08/23/2021   Cancer ([place]) 2001   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  Prior T score -2.5.  DEXA stable from prior study.   Personal history of chemotherapy 2001   Personal history of radiation therapy 2001   Pneumonia    Had 12-2017   PONV (postoperative nausea and vomiting)    Past Surgical History:  Procedure Laterality Date   BREAST EXCISIONAL BIOPSY Left 2021   BREAST LUMPECTOMY Right 2001   CESAREAN SECTION  1991   COLONOSCOPY     MASTECTOMY W/ SENTINEL NODE BIOPSY Right 06/25/2021   Procedure: RIGHT MASTECTOMY WITH SENTINEL LYMPH NODE BIOPSY;  Surgeon: Stark Klein, MD;  Location: [place];  Service: General;  Laterality: Right;   NASAL SINUS SURGERY Right 02/20/2020   Procedure: ENDOSCOPIC SINUS SURGERY WITH RIGHT ETHMOIDECTOMY AND RIGHT MAXILLARY OSTIA ENLARGEMENT WITH FUSION;  Surgeon: Rozetta Nunnery, MD;  Location: [place];  Service: ENT;  Laterality: Right;   PELVIC LAPAROSCOPY     endometriosis   PORTACATH PLACEMENT N/A  08/07/2021   Procedure: PORT PLACEMENT WITH ULTRASOUND GUIDANCE;  Surgeon: Stark Klein, MD;  Location: [facility];  Service: General;  Laterality: N/A;   SINUS ENDO WITH FUSION Right 02/20/2020   Procedure: SINUS ENDO WITH FUSION;  Surgeon: Rozetta Nunnery, MD;  Location: [place];  Service: ENT;  Laterality: Right;   [place]   Patient Active Problem List   Diagnosis Date Noted   Bone pain due to G-CSF 08/23/2021   Cancer of overlapping sites of right female breast ([place]) 06/25/2021   Lateral epicondylitis, left elbow 03/04/2021   Left wrist pain 06/24/2019   Pain in joint of left shoulder 06/24/2019   Pain in joint of left elbow 06/24/2019   Family history of uterine cancer    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 She did not have the muscle spasm this past week just a achy feeling. PERTINENT HISTORY:  Patient was diagnosed on 06/03/21 with right breast cancer recurrence. Initial lumpectomy, SLNB x 3,  radiation, chemotherapy, and antiestrogens. Rt mastectomy on 06/25/21 with Dr. Barry Dienes with another SLNB 0/3 positive lymph nodes.  Other hx: chiari malformation   PATIENT GOALS:  Reassess how my recovery is going related to arm function, pain, and swelling.  PAIN:  Are you having pain? No, just tightness in the arm and across the chest  PRECAUTIONS: Recent Surgery, right UE Lymphedema risk, 6 total lymph nodes removed x 2 breast cancers OBJECTIVE:  PATIENT SURVEYS:  QUICK DASH: 80%  OBSERVATIONS:  Very well healed very flat mastectomy incision. No edema or redness  Pectoralis axillary border tight with spasm during Abduction  POSTURE:  Slightly guarded  LYMPHEDEMA ASSESSMENT:   UPPER EXTREMITY AROM/PROM:   A/PROM RIGHT  06/17/2021   07/18/21 07/30/21 08/06/21 09/10/21 10/16/21 11/06/21  Shoulder extension 53    50    Shoulder flexion 170 145 - feeling tight 155 158 - pull in axilla 160 - pull in the axilla 165 - pull in axilla  170  Shoulder abduction 170 114 130 - pectoralis muscle into chest 115 - pull in the pectoralis - some cording now noted  130 - pulling down arm  150 - pulling down the arm  168  Shoulder internal rotation          Shoulder external rotation 90    90                            (Blank rows = not tested)   CERVICAL AROM: All within normal limits:    LYMPHEDEMA ASSESSMENTS:    [facility] RIGHT  06/17/2021 6/8/23Rt 6/8/23Lt  10 cm proximal to olecranon process 36.5 36.5   Olecranon process 23.5 23.5   10 cm proximal to ulnar styloid process 20.6 20.6   Just proximal to ulnar styloid process 15.3 15.3   Across hand at thumb web space 18 18  At base of 2nd digit 5.8 5.8   (Blank rows = not tested)  LDEX: The patient was assessed using the L-Dex machine today to produce a lymphedema index baseline score. The patient will be reassessed on a regular basis (typically every 3 months) to obtain new L-Dex scores. If the score is > 6.5 points away from his/her baseline  score indicating onset of subclinical lymphedema, it will be recommended to wear a compression garment for 4 weeks, 12 hours per day and then be reassessed. If the score continues to be > 6.5 points from baseline at reassessment, we will initiate lymphedema treatment. Assessing in this manner has a 95% rate of preventing clinically significant lymphedema.    L-DEX FLOWSHEETS - 12/11/21 0800       L-DEX LYMPHEDEMA SCREENING   Measurement Type Unilateral    L-DEX MEASUREMENT EXTREMITY Upper Extremity    POSITION  Standing    DOMINANT SIDE Right    At Risk Side Right    BASELINE SCORE (UNILATERAL) -0.2    L-DEX SCORE (UNILATERAL) 10.9    VALUE CHANGE (UNILAT) 11.1              TODAY'S TREATMENT: 12/11/2021 Therapeutic Exs: Rows 2 min flexion and abduction   Foam roller pec stretch   Foam roller pec stretch open book   Foam roller marches   Foam roller snow angles   Foam roller knee to chest leg straight   Manual Therapy:  Myofascial release to lateral trunk with opposite hands STM in supine to intercostals  MFR to Rt axilla during P/ROM P/ROM in supine to Rt shoulder to per tolerance into flexion,abduction with pt able to achieve full ROM  12/04/21 Therapeutic Exs: Rows 2 min flexion and abduction   Ball into flexion and abduction x 10 each  Pallof with yellow theraband 1x10  Foam roller pec stretch   Foam roller marches   Foam roller snow angles   Nerve tension test:   Median +   Ulnar+   Radial -  Nerve glides- median and ulnar   Theraball marches  Theraball kickouts   Manual Therapy:  Myofascial release to lateral trunk with opposite hands STM in supine to intercostals  MFR to Rt axilla during P/ROM P/ROM in supine to Rt shoulder to per tolerance into flexion,abduction with pt able to achieve full ROM  11/20/21 Therapeutic Exs: Rows   Ball into flexion and abduction x 10 each  Quadruped opposite arm Quadruped opposite arm opposite leg x 10 with  cueing  Pelvic tilts x 10 reps with v/c to move slowly  Pelvic tilts with marching x 10 reps  Pelvic tilt with foot taps x 10 reps  Pallof with yellow theraband 1x10  Standing marches while holding 2 lb weights Standing holding 2 lb weights with unilateral hip flexion and trunk rotation x 10 reps  Manual Therapy: STM in supine to Rt chest wall and pectoralis insertion   MFR to Rt axilla during P/ROM P/ROM in supine to Rt shoulder to per tolerance into flexion,abduction with pt able to achieve full ROM  11/13/21 Therapeutic Exs:   Ball into flexion and abduction x 5 each  Shoulder stretch x 20sec  Tricep stretch 2# x 10: bicep curl, scaption, bent over row, tricep extension, chest press supine, bridge 2x10, quadruped alt UE/LE x 10 with cueing for performance and to not let a glass of water fall off the back - harder with Rt arm holding the weight    LTR  with goal post arms 6" x 5 each   Manual Therapy: STM in supine to Rt chest wall and pectoralis insertion   MFR to Rt axilla during P/ROM P/ROM in supine to Rt shoulder to per tolerance into flexion,abduction with pt able to achieve full ROM  11/06/21 Therapeutic Exs:  Pulleys: x2 mins into flex and abduction   Ball into flexion and abduction x 5 each  LTR with goal post arms 6" x 5 each   Manual Therapy: STM in supine to Rt chest wall and pectoralis insertion   MFR to Rt axilla during P/ROM P/ROM in supine to Rt shoulder to per tolerance into flexion,abduction with pt able to achieve full ROM  10/23/21 Therapeutic Exs:  Pulleys: x2 mins into flex and abduction   Ball into flexion and abduction x 5 each  Manual Therapy: STM in supine to Rt chest wall and pectoralis insertion   MFR to Rt axilla during P/ROM P/ROM in supine to Rt shoulder to per tolerance into flexion,abduction with pt able to achieve full ROM   PATIENT EDUCATION:  Education details: updated HEP, POC Person educated: Patient Education method: Explanation,  Demonstration, Tactile cues, and Handouts Education comprehension: verbalized understanding, returned demonstration, verbal cues required, tactile cues required, and needs further education  HOME EXERCISE PROGRAM: Reviewed previously given post op HEP with changes as needed: Access Code: [email or web address] URL: [email or web address] Date: 07/18/2021 Prepared by: Shan Levans  Exercises - Supine Shoulder Flexion AAROM with Hands Clasped - 1-2 x daily - 7 x weekly - 1 sets - 10 reps - 5 second hold - Supine Chest Stretch with Elbows Bent - 1 x daily - 7 x weekly - 1 sets - 3 reps - 30-60seconds hold - Standing Shoulder Abduction AAROM with Dowel - 1-2 x daily - 7 x weekly - 1 sets - 10 reps - 5 second hold - Supine Thoracic Mobilization Towel Roll Vertical with Arm Stretch - 2 x daily - 7 x weekly - 1 sets - 1 reps - 20-30 seconds hold  Exercises - Standing Median Nerve Glide  - 1 x daily - 7 x weekly - 1 sets - 10 reps - 2 secon hold - Standing Ulnar Nerve Glide  - 1 x daily - 7 x weekly - 1 sets - 10 reps - 2-3 second hold  ASSESSMENT CLINICAL IMPRESSION: Pt responded well to therapy today. Today's session focused on TA and rectus abdominal activation. Pt had an elevated SOZO screen, she was educated on wearing the compression sleeve everyday for a month and then we will recheck her score.  Patient demonstrated great coordination with the more challenging core exercises. Pt had some relief in the cord after the manual stretching. Patient will be having her surgery next week and will follow up with [place] after.   Pt will benefit from skilled therapeutic intervention to improve on the following deficits: Decreased knowledge of precautions, impaired UE functional use, pain, decreased ROM, postural dysfunction.   PT treatment/interventions: ADL/Self care home management, Therapeutic exercises, Patient/Family education, and Manual therapy   GOALS: Goals reviewed with patient? Yes  LONG TERM  GOALS:  (STG=LTG)  GOALS Name Target Date  Goal status  1 Pt will demonstrate she has regained full shoulder ROM and function post operatively compared to baselines.  Baseline: 12/11/21 MET  2 Pt will be educated on lymphedema risk reduction- ABC class - and SOZO surveillance 12/11/21 MET  3 Pt will feel ind with continued HEP and prepared for DIEP flap  surgery 12/11/21 MET          PLAN: PT FREQUENCY/DURATION: 1x per week for 6 weeks  PLAN FOR NEXT SESSION: SOZO in a month      Margurite Duffy, Student-PT 12/11/2021  9:46 AM

## 2021-12-17 ENCOUNTER — Other Ambulatory Visit: Payer: Self-pay

## 2021-12-17 DIAGNOSIS — Z9011 Acquired absence of right breast and nipple: Secondary | ICD-10-CM | POA: Insufficient documentation

## 2021-12-17 HISTORY — PX: BREAST RECONSTRUCTION: SHX9

## 2021-12-27 ENCOUNTER — Other Ambulatory Visit: Payer: Self-pay | Admitting: *Deleted

## 2021-12-27 MED ORDER — MAGIC MOUTHWASH: 5.0000 mL | Freq: Four times a day (QID) | ORAL | 2 refills | Status: DC | PRN

## 2022-01-01 ENCOUNTER — Other Ambulatory Visit: Payer: Self-pay | Admitting: Family Medicine

## 2022-01-01 NOTE — Telephone Encounter (Signed)
Requested medication (s) are due for refill today: Yes  Requested medication (s) are on the active medication list: Yes  Last refill:  04/02/20  Future visit scheduled: Yes  Notes to clinic:  Unable to refill per protocol, cannot delegate.       Requested Prescriptions  Pending Prescriptions Disp Refills   promethazine (PHENERGAN) 25 MG tablet [Pharmacy Med Name: PROMETHAZINE 25 MG TABLET] 20 tablet 0    Sig: TAKE 1 TABLET BY MOUTH EVERY 8 HOURS AS NEEDED FOR NAUSEA AND VOMITING     Not Delegated - Gastroenterology: Antiemetics Failed - 01/01/2022 11:03 AM      Failed - This refill cannot be delegated      Failed - Valid encounter within last 6 months    Recent Outpatient Visits           7 months ago Acute recurrent maxillary sinusitis   [place] Susy Frizzle, MD   9 months ago Cervical cancer screening   [place] Susy Frizzle, MD   1 year ago Acute maxillary sinusitis, recurrence not specified   [place] Pickard, Cammie Mcgee, MD   1 year ago Acute recurrent maxillary sinusitis   [place], Cammie Mcgee, MD   1 year ago Acute maxillary sinusitis, recurrence not specified   [place], [place] Nunnery, MD

## 2022-01-09 ENCOUNTER — Ambulatory Visit (INDEPENDENT_AMBULATORY_CARE_PROVIDER_SITE_OTHER): Payer: BC Managed Care – PPO | Admitting: Family Medicine

## 2022-01-09 ENCOUNTER — Encounter: Payer: Self-pay | Admitting: Family Medicine

## 2022-01-09 VITALS — BP 110/70 | HR 100 | Temp 97.8°F | Ht 68.0 in | Wt 143.0 lb

## 2022-01-09 DIAGNOSIS — B9689 Other specified bacterial agents as the cause of diseases classified elsewhere: Secondary | ICD-10-CM | POA: Diagnosis not present

## 2022-01-09 DIAGNOSIS — J019 Acute sinusitis, unspecified: Secondary | ICD-10-CM | POA: Diagnosis not present

## 2022-01-09 MED ORDER — AMOXICILLIN-POT CLAVULANATE 875-125 MG PO TABS: 1.0000 | ORAL_TABLET | Freq: Two times a day (BID) | ORAL | 0 refills | Status: DC

## 2022-01-09 NOTE — Progress Notes (Signed)
Acute Office Visit  Subjective:     Patient ID: Rachel [name], female    DOB: 13-Nov-1965, 56 y.o.   MRN: [id number]  Chief Complaint  Patient presents with   Cough    coughing/stuffy nose been since monday     Cough   Patient is in today for mucopurulent sinus drainage and pressure, cough, chest congestion, shortness of breath since Monday. Denies fever, chest pain, malaise. Her DIL and grandchildren have had pneumonia in the past few weeks.  Has tried Advil cold and sinus.  Review of Systems  Reason unable to perform ROS: see HPI.  Respiratory:  Positive for cough.   All other systems reviewed and are negative.   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  Prior T score -2.5.  DEXA stable from prior study.   Personal history of chemotherapy 2001   Personal history of radiation therapy 2001   Pneumonia    Had 12-2017   PONV (postoperative nausea and vomiting)    Past Surgical History:  Procedure Laterality Date   BREAST EXCISIONAL BIOPSY Left 2021   BREAST LUMPECTOMY Right 2001   CESAREAN SECTION  1991   COLONOSCOPY     MASTECTOMY W/ SENTINEL NODE BIOPSY Right 06/25/2021   Procedure: RIGHT MASTECTOMY WITH SENTINEL LYMPH NODE BIOPSY;  Surgeon: Stark Klein, MD;  Location: [place];  Service: General;  Laterality: Right;   NASAL SINUS SURGERY Right 02/20/2020   Procedure: ENDOSCOPIC SINUS SURGERY WITH RIGHT ETHMOIDECTOMY AND RIGHT MAXILLARY OSTIA ENLARGEMENT WITH FUSION;  Surgeon: Rozetta Nunnery, MD;  Location: [place];  Service: ENT;  Laterality: Right;   PELVIC LAPAROSCOPY     endometriosis   PORTACATH PLACEMENT N/A 08/07/2021   Procedure: PORT PLACEMENT WITH ULTRASOUND GUIDANCE;  Surgeon: Stark Klein, MD;  [facility];  Service: General;  Laterality: N/A;   SINUS ENDO WITH FUSION Right 02/20/2020   Procedure: SINUS ENDO WITH FUSION;  Surgeon: Rozetta Nunnery, MD;  Location: [place];  Service: ENT;  Laterality: Right;   [place] EXTRACTION  1986   Current Outpatient Medications on File Prior to Visit  Medication Sig Dispense Refill   acetaminophen (TYLENOL) 500 MG tablet Take 1,000 mg by mouth every 6 (six) hours as needed for moderate pain or mild pain.     butalbital-acetaminophen-caffeine (FIORICET, ESGIC) 50-325-40 MG per tablet TAKE 1 TABLET BY MOUTH EVERY 6 HOURS AS NEEDED FOR HEADACHE 20 tablet 0   Cholecalciferol (VITAMIN D PO) Take 1,000 Units by mouth daily.     diphenoxylate-atropine (LOMOTIL) 2.5-0.025 MG tablet Take 1 tablet by mouth 4 (four) times daily as needed for diarrhea or loose stools. 30 tablet 0   fluticasone (FLONASE) 50 MCG/ACT nasal spray INHALE 2 SPRASY IN EACH NOSTRIL ONCE DAILY 16 g 11   gabapentin (NEURONTIN) 100 MG capsule Take 1 capsule (100 mg  total) by mouth 2 (two) times daily. 30 capsule 1   LORazepam (ATIVAN) 0.5 MG tablet Take 1 tablet (0.5 mg total) by mouth every 6 (six) hours as needed for anxiety (nausea). 30 tablet 0   magic mouthwash SOLN Take 5 mLs by mouth 4 (four) times daily as needed for mouth pain. 240 mL 2   methocarbamol (ROBAXIN) 500 MG tablet Take 1 tablet (500 mg total) by mouth every 6 (six) hours as needed for muscle spasms. 20 tablet 1   oxyCODONE (OXY IR/ROXICODONE) 5 MG immediate release tablet Take 1 tablet (5 mg total) by mouth once as needed (for pain score of 1-4). 30 tablet 0   pantoprazole (PROTONIX) 40 MG tablet TAKE 1 TABLET BY MOUTH EVERY DAY 90  tablet 3   promethazine (PHENERGAN) 25 MG tablet TAKE 1 TABLET BY MOUTH EVERY 8 HOURS AS NEEDED FOR NAUSEA/VOMITING 20 tablet 0   rizatriptan (MAXALT) 10 MG tablet TAKE 1 TABLET BY MOUTH EVERY DAY AS NEEDED FOR MIGRANE 6 tablet 5   UNITHROID 112 MCG tablet TAKE 1 TABLET BY MOUTH EVERY DAY BEFORE BREAKFAST 90 tablet 1   ibuprofen (ADVIL) 200 MG tablet Take 400-600 mg by mouth every 6 (six) hours as needed for mild pain or moderate pain. (Patient not taking: Reported on 01/09/2022)     neomycin-polymyxin b-dexamethasone (MAXITROL) 3.5-10000-0.1 SUSP Place 1 drop into the left eye 3 (three) times daily. (Patient not taking: Reported on 01/09/2022)     [DISCONTINUED] prochlorperazine (COMPAZINE) 10 MG tablet Take 1 tablet (10 mg total) by mouth every 6 (six) hours as needed (Nausea or vomiting). 30 tablet 1   Current Facility-Administered Medications on File Prior to Visit  Medication Dose Route Frequency Provider Last Rate Last Admin   ondansetron (ZOFRAN) injection 8 mg  8 mg Intravenous Once Ennever, Rudell Cobb, MD       Allergies  Allergen Reactions   Anesthetics, Ester Nausea And Vomiting   Erythromycin Nausea And Vomiting       Objective:    BP 110/70   Pulse 100   Temp 97.8 F (36.6 C) (Oral)   Ht '5\' 8"'$  (1.727 m)   Wt 143 lb (64.9 kg)   LMP  (LMP Unknown)   SpO2 99%   BMI 21.74 kg/m    Physical Exam Vitals and nursing note reviewed.  Constitutional:      Appearance: Normal appearance. She is normal weight.  HENT:     Head: Normocephalic and atraumatic.     Right Ear: Tympanic membrane, ear canal and external ear normal.     Left Ear: Tympanic membrane, ear canal and external ear normal.     Nose:     Right Sinus: Maxillary sinus tenderness and frontal sinus tenderness present.     Mouth/Throat:     Mouth: Mucous membranes are moist.     Pharynx: Oropharynx is clear.  Eyes:     Conjunctiva/sclera: Conjunctivae normal.  Cardiovascular:     Rate and Rhythm: Normal rate  and regular rhythm.     Pulses: Normal pulses.     Heart sounds: Normal heart sounds.  Pulmonary:     Effort: Pulmonary effort is normal.     Breath sounds: Normal breath sounds.  Lymphadenopathy:     Cervical: No cervical adenopathy.  Skin:    General: Skin is warm and dry.  Neurological:     General: No focal deficit present.     Mental Status: She is alert and oriented to person,  place, and time. Mental status is at baseline.  Psychiatric:        Mood and Affect: Mood normal.        Behavior: Behavior normal.        Thought Content: Thought content normal.        Judgment: Judgment normal.     No results found for any visits on 01/09/22.      Assessment & Plan:   Problem List Items Addressed This Visit       Respiratory   Acute bacterial rhinosinusitis - Primary    Patient's symptoms are consistent with an acute bacterial rhinosinusitis. Will order Augmentin BID for 10 days. May use over the counter flonase, tylenol, saline irrigation. Instructed patient to have a low threshold for returning to office for worsening symptoms including fever and persistent symptoms given her medical history.       Relevant Medications   amoxicillin-clavulanate (AUGMENTIN) 875-125 MG tablet    Meds ordered this encounter  Medications   amoxicillin-clavulanate (AUGMENTIN) 875-125 MG tablet    Sig: Take 1 tablet by mouth 2 (two) times daily.    Dispense:  20 tablet    Refill:  0    Order Specific Question:   Supervising Provider    Answer:   Jenna Luo T [[id number]]    No follow-ups on file.  Rubie Maid, FNP

## 2022-01-09 NOTE — Assessment & Plan Note (Signed)
Patient's symptoms are consistent with an acute bacterial rhinosinusitis. Will order Augmentin BID for 10 days. May use over the counter flonase, tylenol, saline irrigation. Instructed patient to have a low threshold for returning to office for worsening symptoms including fever and persistent symptoms given her medical history.

## 2022-01-15 ENCOUNTER — Encounter: Payer: Self-pay | Admitting: Rehabilitation

## 2022-01-15 ENCOUNTER — Ambulatory Visit: Payer: BC Managed Care – PPO | Attending: General Surgery | Admitting: Rehabilitation

## 2022-01-15 DIAGNOSIS — M25611 Stiffness of right shoulder, not elsewhere classified: Secondary | ICD-10-CM | POA: Insufficient documentation

## 2022-01-15 DIAGNOSIS — C50611 Malignant neoplasm of axillary tail of right female breast: Secondary | ICD-10-CM | POA: Insufficient documentation

## 2022-01-15 DIAGNOSIS — Z171 Estrogen receptor negative status [ER-]: Secondary | ICD-10-CM | POA: Insufficient documentation

## 2022-01-15 DIAGNOSIS — Z483 Aftercare following surgery for neoplasm: Secondary | ICD-10-CM | POA: Insufficient documentation

## 2022-01-15 DIAGNOSIS — R293 Abnormal posture: Secondary | ICD-10-CM | POA: Insufficient documentation

## 2022-01-15 NOTE — Therapy (Signed)
OUTPATIENT PHYSICAL THERAPY SOZO SCREENING NOTE   Patient Name: Rachel [name] MRN: [id number] DOB:1965/11/23, 56 y.o., female Today's Date: 01/15/2022  PCP: Susy Frizzle, MD REFERRING PROVIDER: Stark Klein, MD   PT End of Session - 01/15/22 1052     Visit Number 19   screen only   PT Start Time 0858    PT Stop Time 0905    PT Time Calculation (min) 7 min    Activity Tolerance Patient tolerated treatment well    Behavior During Therapy [facility] for tasks assessed/performed             Past Medical History:  Diagnosis Date   ASCUS of cervix with negative high risk HPV 03/2018   Bone pain due to G-CSF 08/23/2021   Cancer ([place]) 2001   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  Prior T score -2.5.  DEXA stable from prior study.   Personal history of chemotherapy 2001   Personal history of radiation therapy 2001   Pneumonia    Had 12-2017   PONV (postoperative nausea and vomiting)    Past Surgical History:  Procedure Laterality Date   BREAST EXCISIONAL BIOPSY Left 2021   BREAST LUMPECTOMY Right 2001   CESAREAN SECTION  1991   COLONOSCOPY     MASTECTOMY W/ SENTINEL NODE BIOPSY Right 06/25/2021   Procedure: RIGHT MASTECTOMY WITH SENTINEL LYMPH NODE BIOPSY;  Surgeon: Stark Klein, MD;  Location: [place];  Service: General;  Laterality: Right;   NASAL SINUS SURGERY Right 02/20/2020   Procedure: ENDOSCOPIC SINUS SURGERY WITH RIGHT ETHMOIDECTOMY AND RIGHT MAXILLARY OSTIA ENLARGEMENT WITH FUSION;  Surgeon: Rozetta Nunnery, MD;  Location: [place];  Service: ENT;  Laterality: Right;   PELVIC LAPAROSCOPY     endometriosis   PORTACATH PLACEMENT N/A 08/07/2021    Procedure: PORT PLACEMENT WITH ULTRASOUND GUIDANCE;  Surgeon: Stark Klein, MD;  [facility];  Service: General;  Laterality: N/A;   SINUS ENDO WITH FUSION Right 02/20/2020   Procedure: SINUS ENDO WITH FUSION;  Surgeon: Rozetta Nunnery, MD;  Location: [place];  Service: ENT;  Laterality: Right;   [place]   Patient Active Problem List   Diagnosis Date Noted   Acute bacterial rhinosinusitis 01/09/2022   Bone pain due to G-CSF 08/23/2021   Cancer of overlapping sites of right female breast (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 Initial lumpectomy, SLNB x 3, radiation, chemotherapy, and antiestrogens. Rt mastectomy on 06/25/21 with Dr. Barry Dienes with another SLNB 0/3 positive lymph nodes.  Other hx: chiari malformation   PRECAUTIONS: right UE Lymphedema risk  SUBJECTIVE: surgery went well.  May return for PT visits.   PAIN:  Are you having pain? Yes, from surgery  SOZO SCREENING: Patient was assessed today using the SOZO machine to determine the lymphedema index score. This  was compared to her baseline score. It was determined that she is within the recommended range when compared to her baseline and no further action is needed at this time. She will continue SOZO screenings. These are done every 3 months for 2 years post operatively followed by every 6 months for 2 years, and then annually.    Stark Bray, PT 01/15/2022, 10:53 AM

## 2022-01-17 ENCOUNTER — Inpatient Hospital Stay: Payer: BC Managed Care – PPO | Attending: Hematology & Oncology

## 2022-01-17 ENCOUNTER — Inpatient Hospital Stay: Payer: BC Managed Care – PPO

## 2022-01-17 ENCOUNTER — Other Ambulatory Visit: Payer: Self-pay

## 2022-01-17 ENCOUNTER — Inpatient Hospital Stay (HOSPITAL_BASED_OUTPATIENT_CLINIC_OR_DEPARTMENT_OTHER): Payer: BC Managed Care – PPO | Admitting: Hematology & Oncology

## 2022-01-17 ENCOUNTER — Encounter: Payer: Self-pay | Admitting: *Deleted

## 2022-01-17 ENCOUNTER — Encounter: Payer: Self-pay | Admitting: Hematology & Oncology

## 2022-01-17 VITALS — BP 113/62 | HR 50 | Temp 97.8°F | Resp 18 | Ht 68.0 in | Wt 144.0 lb

## 2022-01-17 DIAGNOSIS — E7800 Pure hypercholesterolemia, unspecified: Secondary | ICD-10-CM

## 2022-01-17 DIAGNOSIS — Z171 Estrogen receptor negative status [ER-]: Secondary | ICD-10-CM

## 2022-01-17 DIAGNOSIS — E034 Atrophy of thyroid (acquired): Secondary | ICD-10-CM

## 2022-01-17 DIAGNOSIS — Z853 Personal history of malignant neoplasm of breast: Secondary | ICD-10-CM | POA: Insufficient documentation

## 2022-01-17 DIAGNOSIS — C50911 Malignant neoplasm of unspecified site of right female breast: Secondary | ICD-10-CM | POA: Diagnosis present

## 2022-01-17 DIAGNOSIS — Z9221 Personal history of antineoplastic chemotherapy: Secondary | ICD-10-CM | POA: Insufficient documentation

## 2022-01-17 DIAGNOSIS — R0981 Nasal congestion: Secondary | ICD-10-CM | POA: Insufficient documentation

## 2022-01-17 DIAGNOSIS — Z9011 Acquired absence of right breast and nipple: Secondary | ICD-10-CM | POA: Diagnosis not present

## 2022-01-17 DIAGNOSIS — C50611 Malignant neoplasm of axillary tail of right female breast: Secondary | ICD-10-CM

## 2022-01-17 LAB — CBC WITH DIFFERENTIAL (CANCER CENTER ONLY)
Abs Immature Granulocytes: 0.01 10*3/uL (ref 0.00–0.07)
Basophils Absolute: 0.1 10*3/uL (ref 0.0–0.1)
Basophils Relative: 1 %
Eosinophils Absolute: 0.2 10*3/uL (ref 0.0–0.5)
Eosinophils Relative: 3 %
HCT: 39.7 % (ref 36.0–46.0)
Hemoglobin: 12.6 g/dL (ref 12.0–15.0)
Immature Granulocytes: 0 %
Lymphocytes Relative: 56 %
Lymphs Abs: 3 10*3/uL (ref 0.7–4.0)
MCH: 29.6 pg (ref 26.0–34.0)
MCHC: 31.7 g/dL (ref 30.0–36.0)
MCV: 93.4 fL (ref 80.0–100.0)
Monocytes Absolute: 0.4 10*3/uL (ref 0.1–1.0)
Monocytes Relative: 7 %
Neutro Abs: 1.8 10*3/uL (ref 1.7–7.7)
Neutrophils Relative %: 33 %
Platelet Count: 207 10*3/uL (ref 150–400)
RBC: 4.25 MIL/uL (ref 3.87–5.11)
RDW: 13 % (ref 11.5–15.5)
WBC Count: 5.4 10*3/uL (ref 4.0–10.5)
nRBC: 0 % (ref 0.0–0.2)

## 2022-01-17 LAB — CMP (CANCER CENTER ONLY)
ALT: 15 U/L (ref 0–44)
AST: 15 U/L (ref 15–41)
Albumin: 4.4 g/dL (ref 3.5–5.0)
Alkaline Phosphatase: 81 U/L (ref 38–126)
Anion gap: 7 (ref 5–15)
BUN: 19 mg/dL (ref 6–20)
CO2: 31 mmol/L (ref 22–32)
Calcium: 9.9 mg/dL (ref 8.9–10.3)
Chloride: 107 mmol/L (ref 98–111)
Creatinine: 0.82 mg/dL (ref 0.44–1.00)
GFR, Estimated: >60 mL/min (ref >60)
Glucose, Bld: 105 mg/dL — ABNORMAL HIGH (ref 70–99)
Potassium: 4.5 mmol/L (ref 3.5–5.1)
Sodium: 145 mmol/L (ref 135–145)
Total Bilirubin: 0.4 mg/dL (ref 0.3–1.2)
Total Protein: 6.6 g/dL (ref 6.5–8.1)

## 2022-01-17 LAB — LACTATE DEHYDROGENASE: LDH: 131 U/L (ref 98–192)

## 2022-01-17 NOTE — Progress Notes (Signed)
Patient has completed her neoadjuvant chemo, radiation and now reconstruction. She is still debating her decision on whether or not to follow up with [facility]. She has no navigational needs and as such will discontinue active navigation. Will be available as needed to the patient in the future.   Oncology Nurse Navigator Documentation     01/17/2022    9:15 AM  Oncology Nurse Navigator Flowsheets  Navigation Complete Date: 01/17/2022  Post Navigation: Continue to Follow Patient? No  Reason Not Navigating Patient: No Treatment, Observation Only  Navigator Location [facility]  Navigator Encounter Type Follow-up Appt;Appt/Treatment Plan Review  Patient Visit Type MedOnc  Treatment Phase Post-Tx Follow-up  Barriers/Navigation Needs No Barriers At This Time  Interventions Psycho-Social Support  Acuity Level 1-No Barriers  Support Groups/Services Friends and Family  Time Spent with Patient 15

## 2022-01-17 NOTE — Progress Notes (Signed)
Hematology and Oncology Follow Up Visit  Rachel [name] [facility] [id number] April 04, 1965 56 y.o. 01/17/2022   Principle Diagnosis:  Stage I (T1 N0M0) infiltrating ductal carcinoma the right breast - ER+/PR+/HER2- --  Oncotype = 26  Current Therapy:   Status post mastectomy on 06/25/2021 Taxotere/Cytoxan-adjuvant therapy -- s/p cycle #3/4 - start on 08/16/2021 --completed on 10/25/2021     Interim History:  [name] is back for followup.  She recently underwent breast reconstruction.  This was done down in [place].  This was about a month ago.  I was very impressed with this surgeons work.  She still has some additional work that needs to be done.  She says that the recovery has been quite slow.  She is still not back to work yet.  She still has been thinking about starting an aromatase inhibitor.  She is not sure she wants to start 1 right now since she is recovering from the surgical reconstruction.  She has had no problems with cough or shortness of breath.  There is been no nausea or vomiting.  She has had no change in bowel or bladder habits.  She has had no rashes.  There is been no leg swelling.  Overall, I would have to say that her performance status is probably ECOG 1.    Medications:  Current Outpatient Medications:    acetaminophen (TYLENOL) 500 MG tablet, Take 1,000 mg by mouth every 6 (six) hours as needed for moderate pain or mild pain., Disp: , Rfl:    amoxicillin-clavulanate (AUGMENTIN) 875-125 MG tablet, Take 1 tablet by mouth 2 (two) times daily. (Patient not taking: Reported on 01/09/2022), Disp: 20 tablet, Rfl: 0   butalbital-acetaminophen-caffeine (FIORICET, ESGIC) 50-325-40 MG per tablet, TAKE 1 TABLET BY MOUTH EVERY 6 HOURS AS NEEDED FOR HEADACHE, Disp: 20 tablet, Rfl: 0   Cholecalciferol (VITAMIN D PO), Take 1,000 Units by mouth daily., Disp: , Rfl:    diphenoxylate-atropine (LOMOTIL) 2.5-0.025 MG tablet, Take 1 tablet by mouth 4 (four) times daily as needed for diarrhea or loose  stools., Disp: 30 tablet, Rfl: 0   fluticasone (FLONASE) 50 MCG/ACT nasal spray, INHALE 2 SPRASY IN EACH NOSTRIL ONCE DAILY, Disp: 16 g, Rfl: 11   gabapentin (NEURONTIN) 100 MG capsule, Take 1 capsule (100 mg total) by mouth 2 (two) times daily., Disp: 30 capsule, Rfl: 1   ibuprofen (ADVIL) 200 MG tablet, Take 400-600 mg by mouth every 6 (six) hours as needed for mild pain or moderate pain. (Patient not taking: Reported on 01/09/2022), Disp: , Rfl:    magic mouthwash SOLN, Take 5 mLs by mouth 4 (four) times daily as needed for mouth pain., Disp: 240 mL, Rfl: 2   pantoprazole (PROTONIX) 40 MG tablet, TAKE 1 TABLET BY MOUTH EVERY DAY, Disp: 90 tablet, Rfl: 3   promethazine (PHENERGAN) 25 MG tablet, TAKE 1 TABLET BY MOUTH EVERY 8 HOURS AS NEEDED FOR NAUSEA/VOMITING, Disp: 20 tablet, Rfl: 0   rizatriptan (MAXALT) 10 MG tablet, TAKE 1 TABLET BY MOUTH EVERY DAY AS NEEDED FOR MIGRANE, Disp: 6 tablet, Rfl: 5   UNITHROID 112 MCG tablet, TAKE 1 TABLET BY MOUTH EVERY DAY BEFORE BREAKFAST, Disp: 90 tablet, Rfl: 1  Current Facility-Administered Medications:    ondansetron (ZOFRAN) injection 8 mg, 8 mg, Intravenous, Once, Montay Vanvoorhis, Rudell Cobb, MD  Allergies:  Allergies  Allergen Reactions   Anesthetics, Ester Nausea And Vomiting   Erythromycin Nausea And Vomiting    Past Medical History, Surgical history, Social history, and Family  History were reviewed and updated.  Review of Systems: Review of Systems  Constitutional: Negative.   HENT:  Positive for congestion.   Eyes: Negative.   Respiratory: Negative.    Cardiovascular: Negative.   Gastrointestinal: Negative.   Genitourinary: Negative.   Musculoskeletal: Negative.   Skin: Negative.   Neurological: Negative.   Endo/Heme/Allergies: Negative.   Psychiatric/Behavioral: Negative.       Physical Exam:  height is _0  (1.727 m) and weight is 144 lb (65.3 kg). Her oral temperature is 97.8 F (36.6 C). Her blood pressure is 113/62 and her pulse is  50 (abnormal). Her respiration is 18 and oxygen saturation is 100%.   Physical Exam Vitals reviewed.  Constitutional:      Comments: Her breast exam shows right mastectomy.  She has reconstruction on this now.  This is healing.  There is no erythema.  There is no warmth or swelling.  .  There is no right axillary adenopathy.  Left breast is unremarkable.  There is no mass in the left breast.  There is no left axillary adenopathy.    HENT:     Head: Normocephalic and atraumatic.  Eyes:     Pupils: Pupils are equal, round, and reactive to light.  Cardiovascular:     Rate and Rhythm: Normal rate and regular rhythm.     Heart sounds: Normal heart sounds.  Pulmonary:     Effort: Pulmonary effort is normal.     Breath sounds: Normal breath sounds.  Abdominal:     General: Bowel sounds are normal.     Palpations: Abdomen is soft.     Comments: Abdominal exam shows a well-healed reconstruction scar in the pelvic area.  She has a umbilical surgery scar that is healing.  There is no masses.  There is no tenderness.  There is no fluid wave.  Musculoskeletal:        General: No tenderness or deformity. Normal range of motion.     Cervical back: Normal range of motion.  Lymphadenopathy:     Cervical: No cervical adenopathy.  Skin:    General: Skin is warm and dry.     Findings: No erythema or rash.  Neurological:     Mental Status: She is alert and oriented to person, place, and time.  Psychiatric:        Behavior: Behavior normal.        Thought Content: Thought content normal.        Judgment: Judgment normal.     Lab Results  Component Value Date   WBC 5.4 01/17/2022   HGB 12.6 01/17/2022   HCT 39.7 01/17/2022   MCV 93.4 01/17/2022   PLT 207 01/17/2022     Chemistry      Component Value Date/Time   NA 145 01/17/2022 0833   NA 140 09/26/2016 0743   NA 143 09/21/2015 0854   K 4.5 01/17/2022 0833   K 3.8 09/26/2016 0743   K 4.4 09/21/2015 0854   CL 107 01/17/2022 0833   CL  104 09/26/2016 0743   CO2 31 01/17/2022 0833   CO2 30 09/26/2016 0743   CO2 28 09/21/2015 0854   BUN 19 01/17/2022 0833   BUN 14 09/26/2016 0743   BUN 13.9 09/21/2015 0854   CREATININE 0.82 01/17/2022 0833   CREATININE 0.87 01/01/2018 1055   CREATININE 0.8 09/21/2015 0854      Component Value Date/Time   CALCIUM 9.9 01/17/2022 0833   CALCIUM 9.4 09/26/2016 0743  CALCIUM 10.1 09/21/2015 0854   ALKPHOS 81 01/17/2022 0833   ALKPHOS 80 09/26/2016 0743   ALKPHOS 98 09/21/2015 0854   AST 15 01/17/2022 0833   AST 37 (H) 09/21/2015 0854   ALT 15 01/17/2022 0833   ALT 35 09/26/2016 0743   ALT 57 (H) 09/21/2015 0854   BILITOT 0.4 01/17/2022 0833   BILITOT 0.53 09/21/2015 0854      Impression and Plan: Ms. Firmin is a 56 year old white female with a history of stage I ductal carcinoma of the right breast. She was diagnosed 22 years ago. Her tumor was ER positive. I  think that  she is cured.  Now, she has a second breast cancer.  Again I feel this is not a recurrence in the breast.  I feel this is a second breast cancer.  Again, she completed her adjuvant therapy.  She now is thinking about aromatase inhibitor therapy.  She still has to recover from the reconstruction.  I think she goes by down to [facility] to see the surgeon in a week or so.  I would like to see her back in 3 months.  Of note, her Port-A-Cath has been taken out    Volanda Napoleon, MD 12/8/20239:24 AM

## 2022-01-28 ENCOUNTER — Encounter: Payer: Self-pay | Admitting: Rehabilitation

## 2022-01-28 ENCOUNTER — Ambulatory Visit: Payer: BC Managed Care – PPO | Admitting: Rehabilitation

## 2022-01-28 DIAGNOSIS — Z171 Estrogen receptor negative status [ER-]: Secondary | ICD-10-CM | POA: Diagnosis present

## 2022-01-28 DIAGNOSIS — R293 Abnormal posture: Secondary | ICD-10-CM

## 2022-01-28 DIAGNOSIS — Z483 Aftercare following surgery for neoplasm: Secondary | ICD-10-CM | POA: Diagnosis present

## 2022-01-28 DIAGNOSIS — M25611 Stiffness of right shoulder, not elsewhere classified: Secondary | ICD-10-CM

## 2022-01-28 DIAGNOSIS — C50611 Malignant neoplasm of axillary tail of right female breast: Secondary | ICD-10-CM | POA: Diagnosis present

## 2022-01-28 NOTE — Therapy (Signed)
OUTPATIENT PHYSICAL THERAPY BREAST CANCER RE-EVALUATION   Patient Name: Rachel [name] MRN: [id number] DOB:Mar 19, 1965, 56 y.o., female Today's Date: 01/28/2022   PT End of Session - 01/28/22 0803     Visit Number 1    Number of Visits 9    Date for PT Re-Evaluation 03/11/22    PT Start Time 0802    PT Stop Time 0840    PT Time Calculation (min) 38 min    Activity Tolerance Patient tolerated treatment well    Behavior During Therapy [facility] for tasks assessed/performed            Past Medical History:  Diagnosis Date   ASCUS of cervix with negative high risk HPV 03/2018   Bone pain due to G-CSF 08/23/2021   Cancer ([place]) 2001   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  Prior T score -2.5.  DEXA stable from prior study.   Personal history of chemotherapy 2001   Personal history of radiation therapy 2001   Pneumonia    Had 12-2017   PONV (postoperative nausea and vomiting)    Past Surgical History:  Procedure Laterality Date   BREAST EXCISIONAL BIOPSY Left 2021   BREAST LUMPECTOMY Right 2001   CESAREAN SECTION  1991   COLONOSCOPY     MASTECTOMY W/ SENTINEL NODE BIOPSY Right 06/25/2021   Procedure: RIGHT MASTECTOMY WITH SENTINEL LYMPH NODE BIOPSY;  Surgeon: Stark Klein, MD;  Location: [place];  Service: General;  Laterality: Right;   NASAL SINUS SURGERY Right 02/20/2020   Procedure: ENDOSCOPIC SINUS SURGERY WITH RIGHT ETHMOIDECTOMY AND RIGHT MAXILLARY OSTIA ENLARGEMENT WITH FUSION;  Surgeon: Rozetta Nunnery, MD;  Location: [place];  Service: ENT;  Laterality: Right;   PELVIC LAPAROSCOPY     endometriosis   PORTACATH PLACEMENT N/A 08/07/2021   Procedure: PORT  PLACEMENT WITH ULTRASOUND GUIDANCE;  Surgeon: Stark Klein, MD;  [facility];  Service: General;  Laterality: N/A;   SINUS ENDO WITH FUSION Right 02/20/2020   Procedure: SINUS ENDO WITH FUSION;  Surgeon: Rozetta Nunnery, MD;  Location: [place];  Service: ENT;  Laterality: Right;   [place]   Patient Active Problem List   Diagnosis Date Noted   Acute bacterial rhinosinusitis 01/09/2022   Bone pain due to G-CSF 08/23/2021   Cancer of overlapping sites of right female breast ([place]) 06/25/2021   Lateral epicondylitis, left elbow 03/04/2021   Left wrist pain 06/24/2019   Pain in joint of left shoulder 06/24/2019   Pain in joint of left elbow 06/24/2019   Family history of uterine cancer    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 Barry Dienes  REFERRING DIAG: Rt breast cancer  THERAPY DIAG:  Malignant neoplasm of axillary tail of  right breast in female, estrogen receptor negative ([place])  Stiffness of right shoulder, not elsewhere classified  Aftercare following surgery for neoplasm  Abnormal posture  Rationale for Evaluation and Treatment Rehabilitation  ONSET DATE: 06/03/21  SUBJECTIVE:                                                                                                                                                                                    SUBJECTIVE STATEMENT: I saw my surgeon and he said I have no limitations.  I can run if I wanted to.  One of the breast places seems to not be healing correctly and I may have to have 1 more surgery - that require no drains.    PERTINENT HISTORY:  Patient  was diagnosed on 06/03/21 with right breast cancer recurrence. Initial lumpectomy, SLNB x 3, radiation, chemotherapy, and antiestrogens. Rt mastectomy on 06/25/21 with Dr. Barry Dienes with another SLNB 0/3 positive lymph nodes. Rt DIEP flap and removal of port on 12/17/21.  Other hx: chiari malformation   PATIENT GOALS:  Reassess how my recovery is going related to arm function, pain, and swelling.  PAIN:  Are you having pain? No, just tightness in the arm and across the chest  PRECAUTIONS: Recent Surgery, right UE Lymphedema risk, 6 total lymph nodes removed x 2 breast cancers OBJECTIVE:  PATIENT SURVEYS:  QUICK DASH: 80%  OBSERVATIONS:  New DIEP flap healed well at the breast and abdominal incision.  Assessed abdominal incision gently  for skin mobility with some decreased movement lateral edges of the incision where pt said she had a drain from both edges.  General edema at the Rt breast.   POSTURE:  Slightly guarded - some discomfort lying flat with 1 pillow and legs bent up  LYMPHEDEMA ASSESSMENT:   UPPER EXTREMITY AROM/PROM:   A/PROM RIGHT  06/17/2021   07/18/21 07/30/21 08/06/21 09/10/21 10/16/21 11/06/21 01/28/22  Shoulder extension 53    50     Shoulder flexion 170 145 - feeling tight 155 158 - pull in axilla 160 - pull in the axilla 165 - pull in axilla  170   Shoulder abduction 170 114 130 - pectoralis muscle into chest 115 - pull in the pectoralis - some cording now noted  130 - pulling down arm  150 - pulling down the arm  168   Shoulder internal rotation           Shoulder external rotation 90    90                             (Blank rows = not tested)  CERVICAL AROM: All within normal limits:    LYMPHEDEMA ASSESSMENTS:    LANDMARK RIGHT  06/17/2021 6/8/23Rt   10 cm proximal to olecranon process 36.5 36.5   Olecranon process 23.5 23.5   10 cm proximal to ulnar styloid process 20.6 20.6   Just proximal to ulnar styloid process 15.3 15.3   Across hand at thumb web space 18 18   At  base of 2nd digit 5.8 5.8   (Blank rows = not tested)   TODAY'S TREATMENT: 01/28/22 Review of supine lying work placing hands on the incision with working on diaphragmatic breathing Review and education on 6 week incision mobilization: above and below incision 2-3 fingers with skin stretch R/L, up/down/and circles moving the length of the incision on both sides - avoiding incision until 12 weeks.  PROM of the Rt shoulder into all directions with axillary pinning  STM to the pectoralis Review of trunk AROM to start  12/11/2021 Therapeutic Exs: Rows 2 min flexion and abduction   Foam roller pec stretch   Foam roller pec stretch open book   Foam roller marches   Foam roller snow angles   Foam roller knee to chest leg straight   Manual Therapy:  Myofascial release to lateral trunk with opposite hands STM in supine to intercostals  MFR to Rt axilla during P/ROM P/ROM in supine to Rt shoulder to per tolerance into flexion,abduction with pt able to achieve full ROM  12/04/21 Therapeutic Exs: Rows 2 min flexion and abduction   Ball into flexion and abduction x 10 each  Pallof with yellow theraband 1x10  Foam roller pec stretch   Foam roller marches   Foam roller snow angles   Nerve tension test:   Median +   Ulnar+   Radial -  Nerve glides- median and ulnar   Theraball marches  Theraball kickouts   Manual Therapy:  Myofascial release to lateral trunk with opposite hands STM in supine to intercostals  MFR to Rt axilla during P/ROM P/ROM in supine to Rt shoulder to per tolerance into flexion,abduction with pt able to achieve full ROM   PATIENT EDUCATION:  Education details: updated HEP, POC Person educated: Patient Education method: Explanation, Demonstration, Tactile cues, and Handouts Education comprehension: verbalized understanding, returned demonstration, verbal cues required, tactile cues required, and needs further education  HOME EXERCISE PROGRAM: Reviewed  previously given post op HEP with changes as needed: Access Code: [email or web address] URL: [email or web address] Date: 07/18/2021 Prepared by: Shan Levans  Exercises - Supine Shoulder Flexion AAROM with Hands Clasped - 1-2 x daily - 7 x weekly - 1 sets - 10 reps - 5 second hold - Supine Chest Stretch with Elbows Bent - 1 x daily - 7 x weekly - 1 sets - 3 reps - 30-60seconds hold - Standing Shoulder Abduction AAROM with Dowel - 1-2 x daily - 7 x weekly - 1 sets - 10 reps - 5 second hold - Supine Thoracic Mobilization Towel Roll Vertical with Arm Stretch - 2 x daily - 7 x weekly - 1 sets - 1 reps - 20-30 seconds hold  Exercises - Standing Median Nerve Glide  - 1 x daily - 7 x weekly - 1 sets - 10 reps - 2 secon hold - Standing Ulnar Nerve Glide  - 1 x daily - 7 x weekly - 1 sets - 10 reps - 2-3 second hold  ASSESSMENT CLINICAL IMPRESSION: Pt returns 6 weeks post DIEP flap and is doing very well.  Has tightness  whole anterior body from surgical work, difficulty lying supine, decreased trunk AROM, decrease shoulder AROM and pain from surgery and continued pectoralis tightness. Will extend POC for more visits to get pt back to work and ADL activities.   Pt will benefit from skilled therapeutic intervention to improve on the following deficits: Decreased knowledge of precautions, impaired UE functional use, pain, decreased ROM, postural dysfunction.   PT treatment/interventions: ADL/Self care home management, Therapeutic exercises, Patient/Family education, and Manual therapy   GOALS: Goals reviewed with patient? Yes  LONG TERM GOALS:  (STG=LTG)  GOALS Name Target Date  Goal status  1 Pt will demonstrate she has regained full shoulder ROM and function post operatively compared to baselines.  Baseline: 12/11/21 MET  2 Pt will be educated on lymphedema risk reduction- ABC class - and SOZO surveillance 12/11/21 MET  3 Pt will feel ind with continued HEP and prepared for DIEP flap surgery  12/11/21 MET  4 Pt will tolerate supine lying without limitations       PLAN: PT FREQUENCY/DURATION: 1x per week for 6 weeks  PLAN FOR NEXT SESSION: SOZO in a month      Tamia Cofield, Student-PT 01/28/2022  8:51 AM

## 2022-01-30 ENCOUNTER — Encounter: Payer: Self-pay | Admitting: Rehabilitation

## 2022-01-30 ENCOUNTER — Ambulatory Visit: Payer: BC Managed Care – PPO | Admitting: Rehabilitation

## 2022-01-30 DIAGNOSIS — C50611 Malignant neoplasm of axillary tail of right female breast: Secondary | ICD-10-CM

## 2022-01-30 DIAGNOSIS — Z483 Aftercare following surgery for neoplasm: Secondary | ICD-10-CM

## 2022-01-30 DIAGNOSIS — R293 Abnormal posture: Secondary | ICD-10-CM

## 2022-01-30 DIAGNOSIS — M25611 Stiffness of right shoulder, not elsewhere classified: Secondary | ICD-10-CM

## 2022-01-30 NOTE — Therapy (Signed)
OUTPATIENT PHYSICAL THERAPY BREAST CANCER RE-EVALUATION   Patient Name: Rachel [name] MRN: [id number] DOB:09/18/1965, 56 y.o., female Today's Date: 01/30/2022   PT End of Session - 01/30/22 1001     Visit Number 2    Number of Visits 9    Date for PT Re-Evaluation 03/11/22    PT Start Time 4818    PT Stop Time 1048    PT Time Calculation (min) 46 min    Activity Tolerance Patient tolerated treatment well    Behavior During Therapy [facility] for tasks assessed/performed            Past Medical History:  Diagnosis Date   ASCUS of cervix with negative high risk HPV 03/2018   Bone pain due to G-CSF 08/23/2021   Cancer ([place]) 2001   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  Prior T score -2.5.  DEXA stable from prior study.   Personal history of chemotherapy 2001   Personal history of radiation therapy 2001   Pneumonia    Had 12-2017   PONV (postoperative nausea and vomiting)    Past Surgical History:  Procedure Laterality Date   BREAST EXCISIONAL BIOPSY Left 2021   BREAST LUMPECTOMY Right 2001   CESAREAN SECTION  1991   COLONOSCOPY     MASTECTOMY W/ SENTINEL NODE BIOPSY Right 06/25/2021   Procedure: RIGHT MASTECTOMY WITH SENTINEL LYMPH NODE BIOPSY;  Surgeon: Stark Klein, MD;  Location: [place];  Service: General;  Laterality: Right;   NASAL SINUS SURGERY Right 02/20/2020   Procedure: ENDOSCOPIC SINUS SURGERY WITH RIGHT ETHMOIDECTOMY AND RIGHT MAXILLARY OSTIA ENLARGEMENT WITH FUSION;  Surgeon: Rozetta Nunnery, MD;  Location: [place];  Service: ENT;  Laterality: Right;   PELVIC LAPAROSCOPY     endometriosis   PORTACATH PLACEMENT N/A 08/07/2021   Procedure: PORT  PLACEMENT WITH ULTRASOUND GUIDANCE;  Surgeon: Stark Klein, MD;  Location[facility];  Service: General;  Laterality: N/A;   SINUS ENDO WITH FUSION Right 02/20/2020   Procedure: SINUS ENDO WITH FUSION;  Surgeon: Rozetta Nunnery, MD;  Location: [place];  Service: ENT;  Laterality: Right;   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 Barry Dienes  REFERRING DIAG: Rt breast cancer  THERAPY DIAG:  Malignant neoplasm of axillary tail of  right breast in female, estrogen receptor negative [facility])  Aftercare following surgery for neoplasm  Stiffness of right shoulder, not elsewhere classified  Abnormal posture  Rationale for Evaluation and Treatment Rehabilitation  ONSET DATE: 06/03/21  SUBJECTIVE:                                                                                                                                                                                    SUBJECTIVE STATEMENT: Nothing new   PERTINENT HISTORY:  Patient was diagnosed on 06/03/21 with right breast cancer recurrence. Initial lumpectomy, SLNB x 3, radiation, chemotherapy, and antiestrogens. Rt mastectomy on 06/25/21 with Dr. Barry Dienes with another  SLNB 0/3 positive lymph nodes. Rt DIEP flap and removal of port on 12/17/21.  Other hx: chiari malformation   PATIENT GOALS:  Reassess how my recovery is going related to arm function, pain, and swelling.  PAIN:  Are you having pain? 3-4/10 around the abdomen   PRECAUTIONS: Recent Surgery, right UE Lymphedema risk, 6 total lymph nodes removed x 2 breast cancers OBJECTIVE:  PATIENT SURVEYS:  QUICK DASH: 80%  OBSERVATIONS:  New DIEP flap healed well at the breast and abdominal incision.  Assessed abdominal incision gently  for skin mobility with some decreased movement lateral edges of the incision where pt said she had a drain from both edges.  General edema at the Rt breast.   POSTURE:  Slightly guarded - some discomfort lying flat with 1 pillow and legs bent up  LYMPHEDEMA ASSESSMENT:   UPPER EXTREMITY AROM/PROM:   A/PROM RIGHT  06/17/2021   07/18/21 07/30/21 08/06/21 09/10/21 10/16/21 11/06/21 01/28/22  Shoulder extension 53    50     Shoulder flexion 170 145 - feeling tight 155 158 - pull in axilla 160 - pull in the axilla 165 - pull in axilla  170   Shoulder abduction 170 114 130 - pectoralis muscle into chest 115 - pull in the pectoralis - some cording now noted  130 - pulling down arm  150 - pulling down the arm  168   Shoulder internal rotation           Shoulder external rotation 90    90                             (Blank rows = not tested)   CERVICAL AROM: All within normal limits:    LYMPHEDEMA ASSESSMENTS:    LANDMARK RIGHT  06/17/2021 6/8/23Rt   10 cm proximal to olecranon process 36.5 36.5   Olecranon process 23.5 23.5   10 cm proximal to ulnar styloid process 20.6 20.6  Just proximal to ulnar styloid process 15.3 15.3   Across hand at thumb web space 18 18   At base of 2nd digit 5.8 5.8   (Blank rows = not tested)   TODAY'S TREATMENT: 01/30/22 Therapeutic exercise: Standing shoulder shrugs, scapular retractions, saw motion x 10 each Swan dive motion starting with  neck flexion and working into bil shoulder abduction with neck extension as tolerated x 5 - added to HEP.  supine lying with knees over green bolster to decrease trunk pull placing hands on the incision with working on diaphragmatic breathing x 10 PROM of the Rt shoulder into all directions with axillary pinning  STM to the pectoralis  01/28/22 Review of supine lying work placing hands on the incision with working on diaphragmatic breathing Review and education on 6 week incision mobilization: above and below incision 2-3 fingers with skin stretch R/L, up/down/and circles moving the length of the incision on both sides - avoiding incision until 12 weeks.  PROM of the Rt shoulder into all directions with axillary pinning  STM to the pectoralis Review of trunk AROM to start  12/11/2021 Therapeutic Exs: Rows 2 min flexion and abduction   Foam roller pec stretch   Foam roller pec stretch open book   Foam roller marches   Foam roller snow angles   Foam roller knee to chest leg straight   Manual Therapy:  Myofascial release to lateral trunk with opposite hands STM in supine to intercostals  MFR to Rt axilla during P/ROM P/ROM in supine to Rt shoulder to per tolerance into flexion,abduction with pt able to achieve full ROM   PATIENT EDUCATION:  Education details: updated HEP, POC Person educated: Patient Education method: Consulting civil engineer, Demonstration, Tactile cues, and Handouts Education comprehension: verbalized understanding, returned demonstration, verbal cues required, tactile cues required, and needs further education  HOME EXERCISE PROGRAM: After DIEP: breathing with scar skin sliding around incision only until 12 weeks. Supine lying Standing swan dive motion  ASSESSMENT CLINICAL IMPRESSION: Pt was surprised how she got sweaty and tired from easy AROM/shoulder movements today. Worked on anterior trunk stretches and fascial mobility and pectoralis tightness.   Pt will benefit from  skilled therapeutic intervention to improve on the following deficits: Decreased knowledge of precautions, impaired UE functional use, pain, decreased ROM, postural dysfunction.   PT treatment/interventions: ADL/Self care home management, Therapeutic exercises, Patient/Family education, and Manual therapy   GOALS: Goals reviewed with patient? Yes  LONG TERM GOALS:  (STG=LTG)  GOALS Name Target Date  Goal status  1 Pt will demonstrate she has regained full shoulder ROM and function post operatively compared to baselines.  Baseline: 03/11/22 NEW  2 Pt will be educated on lymphedema risk reduction- ABC class - and SOZO surveillance 12/11/21 MET  3 Pt will feel ind with continued HEP after DIEP flap 03/11/22 NEW  4 Pt will tolerate supine lying without limitations 03/11/22 NEW     PLAN: PT FREQUENCY/DURATION: 1x-2x per week for 6 weeks  PLAN FOR NEXT SESSION:  6 weeks: Patient may resume all aerobic and strengthening activities performed at their baseline  level EXCEPT for abdominal exercises 12+ weeks: Resume abdominal exercises. Examples of exercises to begin your abdominal  recruitment include pelvic tilts, bridging with abdominal recruitment, abdominal  crunches, reverse sit-ups, quadruped pelvic tilts and sitting w/ alternating UE/LE  extension    Shan Levans, PT  01/30/2022  11:39 AM

## 2022-02-05 ENCOUNTER — Ambulatory Visit: Payer: BC Managed Care – PPO | Admitting: Rehabilitation

## 2022-02-05 ENCOUNTER — Encounter: Payer: Self-pay | Admitting: Rehabilitation

## 2022-02-05 DIAGNOSIS — C50611 Malignant neoplasm of axillary tail of right female breast: Secondary | ICD-10-CM | POA: Diagnosis not present

## 2022-02-05 DIAGNOSIS — R293 Abnormal posture: Secondary | ICD-10-CM

## 2022-02-05 DIAGNOSIS — M25611 Stiffness of right shoulder, not elsewhere classified: Secondary | ICD-10-CM

## 2022-02-05 DIAGNOSIS — Z483 Aftercare following surgery for neoplasm: Secondary | ICD-10-CM

## 2022-02-05 NOTE — Therapy (Signed)
OUTPATIENT PHYSICAL THERAPY BREAST CANCER TREATMENT   Patient Name: Rachel [name] MRN: [id number] DOB:Mar 07, 1965, 56 y.o., female Today's Date: 02/05/2022   PT End of Session - 02/05/22 0806     Visit Number 3    Number of Visits 9    Date for PT Re-Evaluation 03/11/22    PT Start Time 0810    PT Stop Time 0855    PT Time Calculation (min) 45 min    Activity Tolerance Patient tolerated treatment well    Behavior During Therapy [facility] for tasks assessed/performed            Past Medical History:  Diagnosis Date   ASCUS of cervix with negative high risk HPV 03/2018   Bone pain due to G-CSF 08/23/2021   Cancer ([place]) 2001   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  Prior T score -2.5.  DEXA stable from prior study.   Personal history of chemotherapy 2001   Personal history of radiation therapy 2001   Pneumonia    Had 12-2017   PONV (postoperative nausea and vomiting)    Past Surgical History:  Procedure Laterality Date   BREAST EXCISIONAL BIOPSY Left 2021   BREAST LUMPECTOMY Right 2001   CESAREAN SECTION  1991   COLONOSCOPY     MASTECTOMY W/ SENTINEL NODE BIOPSY Right 06/25/2021   Procedure: RIGHT MASTECTOMY WITH SENTINEL LYMPH NODE BIOPSY;  Surgeon: Stark Klein, MD;  Location: [place];  Service: General;  Laterality: Right;   NASAL SINUS SURGERY Right 02/20/2020   Procedure: ENDOSCOPIC SINUS SURGERY WITH RIGHT ETHMOIDECTOMY AND RIGHT MAXILLARY OSTIA ENLARGEMENT WITH FUSION;  Surgeon: Rozetta Nunnery, MD;  Location: [place];  Service: ENT;  Laterality: Right;   PELVIC LAPAROSCOPY     endometriosis   PORTACATH PLACEMENT N/A 08/07/2021   Procedure: PORT  PLACEMENT WITH ULTRASOUND GUIDANCE;  Surgeon: Stark Klein, MD;  [facility];  Service: General;  Laterality: N/A;   SINUS ENDO WITH FUSION Right 02/20/2020   Procedure: SINUS ENDO WITH FUSION;  Surgeon: Rozetta Nunnery, MD;  Location: [place];  Service: ENT;  Laterality: Right;   [place]   Patient Active Problem List   Diagnosis Date Noted   Acute bacterial rhinosinusitis 01/09/2022   Bone pain due to G-CSF 08/23/2021   Cancer of overlapping sites of right female breast ([place]) 06/25/2021   Lateral epicondylitis, left elbow 03/04/2021   Left wrist pain 06/24/2019   Pain in joint of left shoulder 06/24/2019   Pain in joint of left elbow 06/24/2019   Family history of uterine cancer    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 Barry Dienes  REFERRING DIAG: Rt breast cancer  THERAPY DIAG:  Malignant neoplasm of axillary tail of  right breast in female, estrogen receptor negative ([place])  Aftercare following surgery for neoplasm  Stiffness of right shoulder, not elsewhere classified  Abnormal posture  Rationale for Evaluation and Treatment Rehabilitation  ONSET DATE: 06/03/21  SUBJECTIVE:                                                                                                                                                                                    SUBJECTIVE STATEMENT: I am sore.  I think from [place] activities.    PERTINENT HISTORY:  Patient was diagnosed on 06/03/21 with right breast cancer recurrence. Initial lumpectomy, SLNB x 3, radiation, chemotherapy, and antiestrogens. Rt mastectomy on  06/25/21 with Dr. Barry Dienes with another SLNB 0/3 positive lymph nodes. Rt DIEP flap and removal of port on 12/17/21.  Other hx: chiari malformation   PATIENT GOALS:  Reassess how my recovery is going related to arm function, pain, and swelling.  PAIN:  Are you having pain? 3/10 around the abdomen   PRECAUTIONS: Recent Surgery, right UE Lymphedema risk, 6 total lymph nodes removed x 2 breast cancers OBJECTIVE:  PATIENT SURVEYS:  QUICK DASH: 80%  OBSERVATIONS:  New DIEP flap healed well at the breast and abdominal incision.  Assessed abdominal incision gently  for skin mobility with some decreased movement lateral edges of the incision where pt said she had a drain from both edges.  General edema at the Rt breast.   POSTURE:  Slightly guarded - some discomfort lying flat with 1 pillow and legs bent up  LYMPHEDEMA ASSESSMENT:   UPPER EXTREMITY AROM/PROM:   A/PROM RIGHT  06/17/2021   07/18/21 07/30/21 08/06/21 09/10/21 10/16/21 11/06/21 01/28/22  Shoulder extension 53    50     Shoulder flexion 170 145 - feeling tight 155 158 - pull in axilla 160 - pull in the axilla 165 - pull in axilla  170   Shoulder abduction 170 114 130 - pectoralis muscle into chest 115 - pull in the pectoralis - some cording now noted  130 - pulling down arm  150 - pulling down the arm  168   Shoulder internal rotation           Shoulder external rotation 90    90                             (Blank rows = not tested)   CERVICAL AROM: All within normal limits:    LYMPHEDEMA ASSESSMENTS:    LANDMARK RIGHT  06/17/2021 6/8/23Rt   10 cm proximal to olecranon process 36.5 36.5   Olecranon process 23.5 23.5   10 cm  proximal to ulnar styloid process 20.6 20.6   Just proximal to ulnar styloid process 15.3 15.3   Across hand at thumb web space 18 18   At base of 2nd digit 5.8 5.8   (Blank rows = not tested)   TODAY'S TREATMENT: 02/05/22 Therapeutic exercise: Standing shoulder shrugs, scapular retractions,x 10 each Seated  yellow band row x 10 with slow movement focus, seated bil ER row yellow band x 10 slow focus  supine lying with knees over green bolster to decrease trunk pull placing hands on the incision with working on diaphragmatic breathing x 10 PROM of the Rt shoulder into all directions with axillary pinning  STM to the pectoralis  01/30/22 Therapeutic exercise: Standing shoulder shrugs, scapular retractions, saw motion x 10 each Swan dive motion starting with neck flexion and working into bil shoulder abduction with neck extension as tolerated x 5 - added to HEP.  supine lying with knees over green bolster to decrease trunk pull placing hands on the incision with working on diaphragmatic breathing x 10 PROM of the Rt shoulder into all directions with axillary pinning  STM to the pectoralis  01/28/22 Review of supine lying work placing hands on the incision with working on diaphragmatic breathing Review and education on 6 week incision mobilization: above and below incision 2-3 fingers with skin stretch R/L, up/down/and circles moving the length of the incision on both sides - avoiding incision until 12 weeks.  PROM of the Rt shoulder into all directions with axillary pinning  STM to the pectoralis Review of trunk AROM to start  12/11/2021 Therapeutic Exs: Rows 2 min flexion and abduction   Foam roller pec stretch   Foam roller pec stretch open book   Foam roller marches   Foam roller snow angles   Foam roller knee to chest leg straight   Manual Therapy:  Myofascial release to lateral trunk with opposite hands STM in supine to intercostals  MFR to Rt axilla during P/ROM P/ROM in supine to Rt shoulder to per tolerance into flexion,abduction with pt able to achieve full ROM   PATIENT EDUCATION:  Education details: updated HEP, POC Person educated: Patient Education method: Consulting civil engineer, Demonstration, Tactile cues, and Handouts Education comprehension: verbalized understanding, returned  demonstration, verbal cues required, tactile cues required, and needs further education  HOME EXERCISE PROGRAM: After DIEP: breathing with scar skin sliding around incision only until 12 weeks. Supine lying Standing swan dive motion  ASSESSMENT CLINICAL IMPRESSION: Tolerated TE better today and was able to add more yellow band back.  Doing well at 7 weeks.   Pt will benefit from skilled therapeutic intervention to improve on the following deficits: Decreased knowledge of precautions, impaired UE functional use, pain, decreased ROM, postural dysfunction.   PT treatment/interventions: ADL/Self care home management, Therapeutic exercises, Patient/Family education, and Manual therapy   GOALS: Goals reviewed with patient? Yes  LONG TERM GOALS:  (STG=LTG)  GOALS Name Target Date  Goal status  1 Pt will demonstrate she has regained full shoulder ROM and function post operatively compared to baselines.  Baseline: 03/11/22 NEW  2 Pt will be educated on lymphedema risk reduction- ABC class - and SOZO surveillance 12/11/21 MET  3 Pt will feel ind with continued HEP after DIEP flap 03/11/22 NEW  4 Pt will tolerate supine lying without limitations 03/11/22 NEW     PLAN: PT FREQUENCY/DURATION: 1x-2x per week for 6 weeks  PLAN FOR NEXT SESSION:  6 weeks: Patient may resume all aerobic and strengthening  activities performed at their baseline  level EXCEPT for abdominal exercises 12+ weeks: Resume abdominal exercises. Examples of exercises to begin your abdominal  recruitment include pelvic tilts, bridging with abdominal recruitment, abdominal  crunches, reverse sit-ups, quadruped pelvic tilts and sitting w/ alternating UE/LE  extension    Shan Levans, PT  02/05/2022  8:56 AM

## 2022-02-11 ENCOUNTER — Ambulatory Visit: Payer: BC Managed Care – PPO | Attending: General Surgery | Admitting: Rehabilitation

## 2022-02-11 ENCOUNTER — Encounter: Payer: Self-pay | Admitting: Rehabilitation

## 2022-02-11 DIAGNOSIS — R293 Abnormal posture: Secondary | ICD-10-CM | POA: Diagnosis present

## 2022-02-11 DIAGNOSIS — Z483 Aftercare following surgery for neoplasm: Secondary | ICD-10-CM | POA: Insufficient documentation

## 2022-02-11 DIAGNOSIS — Z171 Estrogen receptor negative status [ER-]: Secondary | ICD-10-CM | POA: Insufficient documentation

## 2022-02-11 DIAGNOSIS — M25611 Stiffness of right shoulder, not elsewhere classified: Secondary | ICD-10-CM | POA: Insufficient documentation

## 2022-02-11 DIAGNOSIS — C50611 Malignant neoplasm of axillary tail of right female breast: Secondary | ICD-10-CM | POA: Diagnosis present

## 2022-02-11 NOTE — Therapy (Signed)
OUTPATIENT PHYSICAL THERAPY BREAST CANCER TREATMENT   Patient Name: Rachel [name] MRN: [id number] DOB:October 28, 1965, 57 y.o., female Today's Date: 02/11/2022   PT End of Session - 02/11/22 0900     Visit Number 4    Number of Visits 9    Date for PT Re-Evaluation 03/11/22    PT Start Time 0900    Activity Tolerance Patient tolerated treatment well    Behavior During Therapy [facility] for tasks assessed/performed             Past Medical History:  Diagnosis Date   ASCUS of cervix with negative high risk HPV 03/2018   Bone pain due to G-CSF 08/23/2021   Cancer ([place]) 2001   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  Prior T score -2.5.  DEXA stable from prior study.   Personal history of chemotherapy 2001   Personal history of radiation therapy 2001   Pneumonia    Had 12-2017   PONV (postoperative nausea and vomiting)    Past Surgical History:  Procedure Laterality Date   BREAST EXCISIONAL BIOPSY Left 2021   BREAST LUMPECTOMY Right 2001   CESAREAN SECTION  1991   COLONOSCOPY     MASTECTOMY W/ SENTINEL NODE BIOPSY Right 06/25/2021   Procedure: RIGHT MASTECTOMY WITH SENTINEL LYMPH NODE BIOPSY;  Surgeon: Stark Klein, MD;  Location: [place];  Service: General;  Laterality: Right;   NASAL SINUS SURGERY Right 02/20/2020   Procedure: ENDOSCOPIC SINUS SURGERY WITH RIGHT ETHMOIDECTOMY AND RIGHT MAXILLARY OSTIA ENLARGEMENT WITH FUSION;  Surgeon: Rozetta Nunnery, MD;  Location: [place];  Service: ENT;  Laterality: Right;   PELVIC LAPAROSCOPY     endometriosis   PORTACATH PLACEMENT N/A 08/07/2021   Procedure: PORT PLACEMENT WITH ULTRASOUND GUIDANCE;  Surgeon: Stark Klein, MD;   [facility];  Service: General;  Laterality: N/A;   SINUS ENDO WITH FUSION Right 02/20/2020   Procedure: SINUS ENDO WITH FUSION;  Surgeon: Rozetta Nunnery, MD;  Location: [place];  Service: ENT;  Laterality: Right;   TONSILLECTOMY  1980   UPPER GI ENDOSCOPY     [place]   Patient Active Problem List   Diagnosis Date Noted   Acute bacterial rhinosinusitis 01/09/2022   Bone pain due to G-CSF 08/23/2021   Cancer of overlapping sites of right female breast ([place]) 06/25/2021   Lateral epicondylitis, left elbow 03/04/2021   Left wrist pain 06/24/2019   Pain in joint of left shoulder 06/24/2019   Pain in joint of left elbow 06/24/2019   Family history of uterine cancer    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  Not sure of any reasons.    PERTINENT HISTORY:  Patient was diagnosed on 06/03/21 with right breast cancer recurrence. Initial lumpectomy, SLNB x 3, radiation, chemotherapy, and antiestrogens. Rt mastectomy on 06/25/21 with Dr. Barry Dienes with  another SLNB 0/3 positive lymph nodes. Rt DIEP flap and removal of port on 12/17/21.  Other hx: chiari malformation   PATIENT GOALS:  Reassess how my recovery is going related to arm function, pain, and swelling.  PAIN:  Are you having pain? 3/10 around the abdomen   PRECAUTIONS: Recent Surgery, right UE Lymphedema risk, 6 total lymph nodes removed x 2 breast cancers OBJECTIVE:  PATIENT SURVEYS:  QUICK DASH: 80%  OBSERVATIONS:  New DIEP flap healed well at the breast and abdominal incision.  Assessed abdominal incision gently  for skin mobility with some decreased movement lateral edges of the incision where pt said she had a drain from both edges.  General edema at the Rt breast.   POSTURE:  Slightly guarded - some discomfort lying flat with 1 pillow and legs bent up  LYMPHEDEMA ASSESSMENT:   UPPER EXTREMITY AROM/PROM:   A/PROM RIGHT  06/17/2021   07/18/21 07/30/21 08/06/21 09/10/21 10/16/21 11/06/21 01/28/22  Shoulder extension 53    50     Shoulder flexion 170 145 - feeling tight 155 158 - pull in axilla 160 - pull in the axilla 165 - pull in axilla  170   Shoulder abduction 170 114 130 - pectoralis muscle into chest 115 - pull in the pectoralis - some cording now noted  130 - pulling down arm  150 - pulling down the arm  168   Shoulder internal rotation           Shoulder external rotation 90    90                             (Blank rows = not tested)   CERVICAL AROM: All within normal limits:    LYMPHEDEMA ASSESSMENTS:    LANDMARK RIGHT  06/17/2021 6/8/23Rt   10 cm proximal to olecranon process 36.5 36.5   Olecranon process 23.5 23.5   10 cm proximal to ulnar styloid process 20.6  20.6   Just proximal to ulnar styloid process 15.3 15.3   Across hand at thumb web space 18 18   At base of 2nd digit 5.8 5.8   (Blank rows = not tested)   TODAY'S TREATMENT: 02/11/22 Therapeutic exercise: Pulleys into flexion and abduction x 28mn each  Standing shoulder shrugs, scapular retractions,x  10 each Seated yellow band row x 10 with slow movement focus, seated bil ER row yellow band x 10 slow focus Single arm yellow band row x 10 slow focus  supine lying with knees over green bolster to decrease trunk pull placing hands on the incision with working on diaphragmatic breathing x 10 PROM of the Rt shoulder into all directions with axillary pinning  STM to the pectoralis  02/05/22 Therapeutic exercise: Standing shoulder shrugs, scapular retractions,x 10 each Seated yellow band row x 10 with slow movement focus, seated bil ER row yellow band x 10 slow focus  supine lying with knees over green bolster to decrease trunk pull placing hands on the incision with working on diaphragmatic breathing x 10 PROM of the Rt shoulder into all directions with axillary pinning  STM to the pectoralis  01/30/22 Therapeutic exercise: Standing shoulder shrugs, scapular retractions, saw motion x 10 each Swan dive motion starting with neck flexion and working into bil shoulder abduction with neck extension as tolerated x 5 - added to HEP.  supine lying with knees over green bolster to decrease trunk pull placing hands on the incision with working on diaphragmatic breathing x 10 PROM of the Rt shoulder into all directions with axillary pinning  STM to the pectoralis   PATIENT EDUCATION:  Education details: updated HEP, POC Person educated: Patient Education method: Explanation, Demonstration, Tactile cues, and Handouts Education comprehension: verbalized understanding, returned demonstration, verbal cues required, tactile cues required, and needs further education  HOME EXERCISE PROGRAM: After DIEP: breathing with scar skin sliding around incision only until 12 weeks. Supine lying Standing swan dive motion  ASSESSMENT CLINICAL IMPRESSION: New Rt sided abdominal mm spasms.  Feels a bit tighter on the Rt side compared to the Left but no hernia or anything abnormal palpated.  Pt will see if there is  any pattern to the new pain.   Pt will benefit from skilled therapeutic intervention to improve on the following deficits: Decreased knowledge of precautions, impaired UE functional use, pain, decreased ROM, postural dysfunction.   PT treatment/interventions: ADL/Self care home management, Therapeutic exercises, Patient/Family education, and Manual therapy   GOALS: Goals reviewed with patient? Yes  LONG TERM GOALS:  (STG=LTG)  GOALS Name Target Date  Goal status  1 Pt will demonstrate she has regained full shoulder ROM and function post operatively compared to baselines.  Baseline: 03/11/22 NEW  2 Pt will be educated on lymphedema risk reduction- ABC class - and SOZO surveillance 12/11/21 MET  3 Pt will feel ind with continued HEP after DIEP flap 03/11/22 NEW  4 Pt will tolerate supine lying without limitations 03/11/22 NEW     PLAN: PT FREQUENCY/DURATION: 1x-2x per week for 6 weeks  PLAN FOR NEXT SESSION:  6 weeks: Patient may resume all aerobic and strengthening activities performed at their baseline  level EXCEPT for abdominal exercises 12+ weeks: Resume abdominal exercises. Examples of exercises to begin your abdominal  recruitment include pelvic tilts, bridging with abdominal recruitment, abdominal  crunches, reverse sit-ups, quadruped pelvic tilts and sitting w/ alternating UE/LE  extension    KShan Levans PT  02/11/2022  9:00 AM

## 2022-02-13 ENCOUNTER — Ambulatory Visit: Payer: BC Managed Care – PPO

## 2022-02-13 DIAGNOSIS — R293 Abnormal posture: Secondary | ICD-10-CM

## 2022-02-13 DIAGNOSIS — Z483 Aftercare following surgery for neoplasm: Secondary | ICD-10-CM

## 2022-02-13 DIAGNOSIS — Z171 Estrogen receptor negative status [ER-]: Secondary | ICD-10-CM

## 2022-02-13 DIAGNOSIS — M25611 Stiffness of right shoulder, not elsewhere classified: Secondary | ICD-10-CM

## 2022-02-13 DIAGNOSIS — C50611 Malignant neoplasm of axillary tail of right female breast: Secondary | ICD-10-CM | POA: Diagnosis not present

## 2022-02-13 NOTE — Therapy (Signed)
OUTPATIENT PHYSICAL THERAPY BREAST CANCER TREATMENT   Patient Name: Rachel [name] MRN: [id number] DOB:05/14/1965, 57 y.o., female Today's Date: 02/13/2022   PT End of Session - 02/13/22 1110     Visit Number 5    Number of Visits 9    Date for PT Re-Evaluation 03/11/22    PT Start Time 1106    PT Stop Time 1202    PT Time Calculation (min) 56 min    Activity Tolerance Patient tolerated treatment well    Behavior During Therapy [facility] for tasks assessed/performed             Past Medical History:  Diagnosis Date   ASCUS of cervix with negative high risk HPV 03/2018   Bone pain due to G-CSF 08/23/2021   Cancer ([place]) 2001   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  Prior T score -2.5.  DEXA stable from prior study.   Personal history of chemotherapy 2001   Personal history of radiation therapy 2001   Pneumonia    Had 12-2017   PONV (postoperative nausea and vomiting)    Past Surgical History:  Procedure Laterality Date   BREAST EXCISIONAL BIOPSY Left 2021   BREAST LUMPECTOMY Right 2001   CESAREAN SECTION  1991   COLONOSCOPY     MASTECTOMY W/ SENTINEL NODE BIOPSY Right 06/25/2021   Procedure: RIGHT MASTECTOMY WITH SENTINEL LYMPH NODE BIOPSY;  Surgeon: Stark Klein, MD;  Location: [place];  Service: General;  Laterality: Right;   NASAL SINUS SURGERY Right 02/20/2020   Procedure: ENDOSCOPIC SINUS SURGERY WITH RIGHT ETHMOIDECTOMY AND RIGHT MAXILLARY OSTIA ENLARGEMENT WITH FUSION;  Surgeon: Rozetta Nunnery, MD;  Location: [place];  Service: ENT;  Laterality: Right;   PELVIC LAPAROSCOPY     endometriosis   PORTACATH PLACEMENT N/A 08/07/2021   Procedure: PORT  PLACEMENT WITH ULTRASOUND GUIDANCE;  Surgeon: Stark Klein, MD;  [facility];  Service: General;  Laterality: N/A;   SINUS ENDO WITH FUSION Right 02/20/2020   Procedure: SINUS ENDO WITH FUSION;  Surgeon: Rozetta Nunnery, MD;  Location: [place];  Service: ENT;  Laterality: Right;   [place]   Patient Active Problem List   Diagnosis Date Noted   Acute bacterial rhinosinusitis 01/09/2022   Bone pain due to G-CSF 08/23/2021   Cancer of overlapping sites of right female breast ([place]) 06/25/2021   Lateral epicondylitis, left elbow 03/04/2021   Left wrist pain 06/24/2019   Pain in joint of left shoulder 06/24/2019   Pain in joint of left elbow 06/24/2019   Family history of uterine cancer    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 Barry Dienes  REFERRING DIAG: Rt breast cancer  THERAPY DIAG:  Malignant neoplasm of axillary tail  of right breast in female, estrogen receptor negative ([place])  Aftercare following surgery for neoplasm  Abnormal posture  Stiffness of right shoulder, not elsewhere classified  Rationale for Evaluation and Treatment Rehabilitation  ONSET DATE: 06/03/21  SUBJECTIVE:                                                                                                                                                                                    SUBJECTIVE STATEMENT: I felt fine after the new exercises last time. I was just a little sore.   PERTINENT HISTORY:  Patient was diagnosed on 06/03/21 with right breast cancer recurrence. Initial lumpectomy, SLNB x 3, radiation, chemotherapy, and  antiestrogens. Rt mastectomy on 06/25/21 with Dr. Barry Dienes with another SLNB 0/3 positive lymph nodes. Rt DIEP flap and removal of port on 12/17/21.  Other hx: chiari malformation   PATIENT GOALS:  Reassess how my recovery is going related to arm function, pain, and swelling.  PAIN:  Are you having pain? 3/10 around the abdomen   PRECAUTIONS: Recent Surgery, right UE Lymphedema risk, 6 total lymph nodes removed x 2 breast cancers OBJECTIVE:  PATIENT SURVEYS:  QUICK DASH: 80%  OBSERVATIONS:  New DIEP flap healed well at the breast and abdominal incision.  Assessed abdominal incision gently  for skin mobility with some decreased movement lateral edges of the incision where pt said she had a drain from both edges.  General edema at the Rt breast.   POSTURE:  Slightly guarded - some discomfort lying flat with 1 pillow and legs bent up  LYMPHEDEMA ASSESSMENT:   UPPER EXTREMITY AROM/PROM:   A/PROM RIGHT  06/17/2021   07/18/21 07/30/21 08/06/21 09/10/21 10/16/21 11/06/21 01/28/22  Shoulder extension 53    50     Shoulder flexion 170 145 - feeling tight 155 158 - pull in axilla 160 - pull in the axilla 165 - pull in axilla  170   Shoulder abduction 170 114 130 - pectoralis muscle into chest 115 - pull in the pectoralis - some cording now noted  130 - pulling down arm  150 - pulling down the arm  168   Shoulder internal rotation           Shoulder external rotation 90    90                             (Blank rows = not tested)   CERVICAL AROM: All within normal limits:    LYMPHEDEMA ASSESSMENTS:    LANDMARK RIGHT  06/17/2021 6/8/23Rt   10 cm proximal to olecranon process 36.5 36.5   Olecranon process  23.5 23.5   10 cm proximal to ulnar styloid process 20.6 20.6   Just proximal to ulnar styloid process 15.3 15.3   Across hand at thumb web space 18 18   At base of 2nd digit 5.8 5.8   (Blank rows = not tested)   TODAY'S TREATMENT: 02/13/22: Therapeutic exercise: Pulleys into flexion and abduction  x 23mn each  Ball roll up wall flexion x10 Standing shoulder shrugs, scapular retractions,x 10 each Seated yellow band row x 10 with slow movement focus, seated bil ER row yellow band x 10 slow focus Single arm yellow band shoulder ext x 10 slow focus and limited end Rt UE ROM due to increased pull reported at incisions supine lying with knees over green bolster to decrease trunk pull placing hands on the incision with working on diaphragmatic breathing x 10 Manual Therapy PROM of the Rt shoulder into all directions with axillary pinning  STM to the pectoralis  02/11/22 Therapeutic exercise: Pulleys into flexion and abduction x 263m each  Standing shoulder shrugs, scapular retractions,x 10 each Seated yellow band row x 10 with slow movement focus, seated bil ER row yellow band x 10 slow focus Single arm yellow band row x 10 slow focus  supine lying with knees over green bolster to decrease trunk pull placing hands on the incision with working on diaphragmatic breathing x 10 PROM of the Rt shoulder into all directions with axillary pinning  STM to the pectoralis  02/05/22 Therapeutic exercise: Standing shoulder shrugs, scapular retractions,x 10 each Seated yellow band row x 10 with slow movement focus, seated bil ER row yellow band x 10 slow focus  supine lying with knees over green bolster to decrease trunk pull placing hands on the incision with working on diaphragmatic breathing x 10 PROM of the Rt shoulder into all directions with axillary pinning  STM to the pectoralis  01/30/22 Therapeutic exercise: Standing shoulder shrugs, scapular retractions, saw motion x 10 each Swan dive motion starting with neck flexion and working into bil shoulder abduction with neck extension as tolerated x 5 - added to HEP.  supine lying with knees over green bolster to decrease trunk pull placing hands on the incision with working on diaphragmatic breathing x 10 PROM of the Rt shoulder into all  directions with axillary pinning  STM to the pectoralis   PATIENT EDUCATION:  Education details: updated HEP, POC Person educated: Patient Education method: Explanation, Demonstration, Tactile cues, and Handouts Education comprehension: verbalized understanding, returned demonstration, verbal cues required, tactile cues required, and needs further education  HOME EXERCISE PROGRAM: After DIEP: breathing with scar skin sliding around incision only until 12 weeks. Supine lying Standing swan dive motion  ASSESSMENT CLINICAL IMPRESSION: Pt reports the Rt sided abdominal spasms are some improved since last session and she felt good after session other than some expected soreness from new exercises. Continued with same exercises which pt reports still feeling challenged by. Also continued with manual therapy working to improve Rt shoulder ROM/decrease upper quadrant tightness.   Pt will benefit from skilled therapeutic intervention to improve on the following deficits: Decreased knowledge of precautions, impaired UE functional use, pain, decreased ROM, postural dysfunction.   PT treatment/interventions: ADL/Self care home management, Therapeutic exercises, Patient/Family education, and Manual therapy   GOALS: Goals reviewed with patient? Yes  LONG TERM GOALS:  (STG=LTG)  GOALS Name Target Date  Goal status  1 Pt will demonstrate she has regained full shoulder ROM and function post operatively compared to  baselines.  Baseline: 03/11/22 NEW  2 Pt will be educated on lymphedema risk reduction- ABC class - and SOZO surveillance 12/11/21 MET  3 Pt will feel ind with continued HEP after DIEP flap 03/11/22 NEW  4 Pt will tolerate supine lying without limitations 03/11/22 NEW     PLAN: PT FREQUENCY/DURATION: 1x-2x per week for 6 weeks  PLAN FOR NEXT SESSION:  6 weeks: Patient may resume all aerobic and strengthening activities performed at their baseline  level EXCEPT for abdominal  exercises 12+ weeks: Resume abdominal exercises. Examples of exercises to begin your abdominal  recruitment include pelvic tilts, bridging with abdominal recruitment, abdominal  crunches, reverse sit-ups, quadruped pelvic tilts and sitting w/ alternating UE/LE  extension   Collie Siad, PTA 02/13/22 12:05 PM

## 2022-02-19 ENCOUNTER — Encounter: Payer: Self-pay | Admitting: Rehabilitation

## 2022-02-19 ENCOUNTER — Ambulatory Visit: Payer: BC Managed Care – PPO | Admitting: Rehabilitation

## 2022-02-19 DIAGNOSIS — Z171 Estrogen receptor negative status [ER-]: Secondary | ICD-10-CM

## 2022-02-19 DIAGNOSIS — C50611 Malignant neoplasm of axillary tail of right female breast: Secondary | ICD-10-CM | POA: Diagnosis not present

## 2022-02-19 DIAGNOSIS — R293 Abnormal posture: Secondary | ICD-10-CM

## 2022-02-19 DIAGNOSIS — M25611 Stiffness of right shoulder, not elsewhere classified: Secondary | ICD-10-CM

## 2022-02-19 DIAGNOSIS — Z483 Aftercare following surgery for neoplasm: Secondary | ICD-10-CM

## 2022-02-19 NOTE — Therapy (Signed)
OUTPATIENT PHYSICAL THERAPY BREAST CANCER TREATMENT   Patient Name: Rachel [name] MRN: [id number] DOB:09-01-1965, 57 y.o., female Today's Date: 02/19/2022   PT End of Session - 02/19/22 1652     Visit Number 6    Number of Visits 9    Date for PT Re-Evaluation 03/11/22    PT Start Time 1606    PT Stop Time 1655    PT Time Calculation (min) 47 min    Activity Tolerance Patient tolerated treatment well    Behavior During Therapy [facility] for tasks assessed/performed              Past Medical History:  Diagnosis Date   ASCUS of cervix with negative high risk HPV 03/2018   Bone pain due to G-CSF 08/23/2021   Cancer ([place]) 2001   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  Prior T score -2.5.  DEXA stable from prior study.   Personal history of chemotherapy 2001   Personal history of radiation therapy 2001   Pneumonia    Had 12-2017   PONV (postoperative nausea and vomiting)    Past Surgical History:  Procedure Laterality Date   BREAST EXCISIONAL BIOPSY Left 2021   BREAST LUMPECTOMY Right 2001   CESAREAN SECTION  1991   COLONOSCOPY     MASTECTOMY W/ SENTINEL NODE BIOPSY Right 06/25/2021   Procedure: RIGHT MASTECTOMY WITH SENTINEL LYMPH NODE BIOPSY;  Surgeon: Stark Klein, MD;  Location: [place];  Service: General;  Laterality: Right;   NASAL SINUS SURGERY Right 02/20/2020   Procedure: ENDOSCOPIC SINUS SURGERY WITH RIGHT ETHMOIDECTOMY AND RIGHT MAXILLARY OSTIA ENLARGEMENT WITH FUSION;  Surgeon: Rozetta Nunnery, MD;  Location: [place];  Service: ENT;  Laterality: Right;   PELVIC LAPAROSCOPY     endometriosis   PORTACATH PLACEMENT N/A 08/07/2021   Procedure: PORT  PLACEMENT WITH ULTRASOUND GUIDANCE;  Surgeon: Stark Klein, MD;  [facility];  Service: General;  Laterality: N/A;   SINUS ENDO WITH FUSION Right 02/20/2020   Procedure: SINUS ENDO WITH FUSION;  Surgeon: Rozetta Nunnery, MD;  Location: [place];  Service: ENT;  Laterality: Right;   [place]   Patient Active Problem List   Diagnosis Date Noted   Acute bacterial rhinosinusitis 01/09/2022   Bone pain due to G-CSF 08/23/2021   Cancer of overlapping sites of right female breast ([place]) 06/25/2021   Lateral epicondylitis, left elbow 03/04/2021   Left wrist pain 06/24/2019   Pain in joint of left shoulder 06/24/2019   Pain in joint of left elbow 06/24/2019   Family history of uterine cancer    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 Barry Dienes  REFERRING DIAG: Rt breast cancer  THERAPY DIAG:  Malignant neoplasm of axillary  tail of right breast in female, estrogen receptor negative ([place])  Abnormal posture  Aftercare following surgery for neoplasm  Stiffness of right shoulder, not elsewhere classified  Rationale for Evaluation and Treatment Rehabilitation  ONSET DATE: 06/03/21  SUBJECTIVE:                                                                                                                                                                                    SUBJECTIVE STATEMENT: The abdominal spasms are kind of back.  I didn't take muscle relaxers for work now this week.  I am exhausted.    PERTINENT HISTORY:  Patient was diagnosed on 06/03/21 with right breast cancer recurrence. Initial lumpectomy, SLNB x 3,  radiation, chemotherapy, and antiestrogens. Rt mastectomy on 06/25/21 with Dr. Barry Dienes with another SLNB 0/3 positive lymph nodes. Rt DIEP flap and removal of port on 12/17/21.  Other hx: chiari malformation   PATIENT GOALS:  Reassess how my recovery is going related to arm function, pain, and swelling.  PAIN:  Are you having pain? 3/10 around the abdomen   PRECAUTIONS: Recent Surgery, right UE Lymphedema risk, 6 total lymph nodes removed x 2 breast cancers OBJECTIVE:  PATIENT SURVEYS:  QUICK DASH: 80%  OBSERVATIONS:  New DIEP flap healed well at the breast and abdominal incision.  Assessed abdominal incision gently  for skin mobility with some decreased movement lateral edges of the incision where pt said she had a drain from both edges.  General edema at the Rt breast.   POSTURE:  Slightly guarded - some discomfort lying flat with 1 pillow and legs bent up  LYMPHEDEMA ASSESSMENT:   UPPER EXTREMITY AROM/PROM:   A/PROM RIGHT  06/17/2021   07/18/21 07/30/21 08/06/21 09/10/21 10/16/21 11/06/21 01/28/22  Shoulder extension 53    50     Shoulder flexion 170 145 - feeling tight 155 158 - pull in axilla 160 - pull in the axilla 165 - pull in axilla  170   Shoulder abduction 170 114 130 - pectoralis muscle into chest 115 - pull in the pectoralis - some cording now noted  130 - pulling down arm  150 - pulling down the arm  168   Shoulder internal rotation           Shoulder external rotation 90    90                             (Blank rows = not tested)   CERVICAL AROM: All within normal limits:    LYMPHEDEMA ASSESSMENTS:    LANDMARK RIGHT  06/17/2021 6/8/23Rt   10 cm proximal  to olecranon process 36.5 36.5   Olecranon process 23.5 23.5   10 cm proximal to ulnar styloid process 20.6 20.6   Just proximal to ulnar styloid process 15.3 15.3   Across hand at thumb web space 18 18   At base of 2nd digit 5.8 5.8   (Blank rows = not tested)   TODAY'S TREATMENT: 02/19/22: Therapeutic  exercise: Pulleys into flexion and abduction x 64mn each  Standing shoulder shrugs, scapular retractions, arm saw, single arm abduction bil, behind the back slidesx 10 each Swam dive motion with neck extension: getting up to around 75% with arms  supine lying with knees fully extended placing hands on the incision with working on diaphragmatic breathing x 10 Manual Therapy PROM of the Rt shoulder into all directions with axillary pinning  STM to the pectoralis  02/13/22: Therapeutic exercise: Pulleys into flexion and abduction x 2462m each  Ball roll up wall flexion x10 Standing shoulder shrugs, scapular retractions,x 10 each Seated yellow band row x 10 with slow movement focus, seated bil ER row yellow band x 10 slow focus Single arm yellow band shoulder ext x 10 slow focus and limited end Rt UE ROM due to increased pull reported at incisions supine lying with knees over green bolster to decrease trunk pull placing hands on the incision with working on diaphragmatic breathing x 10 Manual Therapy PROM of the Rt shoulder into all directions with axillary pinning  STM to the pectoralis  02/11/22 Therapeutic exercise: Pulleys into flexion and abduction x [id number]  Standing shoulder shrugs, scapular retractions,x 10 each Seated yellow band row x 10 with slow movement focus, seated bil ER row yellow band x 10 slow focus Single arm yellow band row x 10 slow focus  supine lying with knees over green bolster to decrease trunk pull placing hands on the incision with working on diaphragmatic breathing x 10 PROM of the Rt shoulder into all directions with axillary pinning  STM to the pectoralis    PATIENT EDUCATION:  Education details: updated HEP, POC Person educated: Patient Education method: ExpConsulting civil engineeremonstration, Tactile cues, and Handouts Education comprehension: verbalized understanding, returned demonstration, verbal cues required, tactile cues required, and needs further  education  HOME EXERCISE PROGRAM: After DIEP: breathing with scar skin sliding around incision only until 12 weeks. Supine lying Standing swan dive motion  ASSESSMENT CLINICAL IMPRESSION: Pt reports the abdominal spasms are increased since not using mm relaxers at work.  Switched back to AROM/muscle lengthening due to return to work soreness.     Pt will benefit from skilled therapeutic intervention to improve on the following deficits: Decreased knowledge of precautions, impaired UE functional use, pain, decreased ROM, postural dysfunction.   PT treatment/interventions: ADL/Self care home management, Therapeutic exercises, Patient/Family education, and Manual therapy   GOALS: Goals reviewed with patient? Yes  LONG TERM GOALS:  (STG=LTG)  GOALS Name Target Date  Goal status  1 Pt will demonstrate she has regained full shoulder ROM and function post operatively compared to baselines.  Baseline: 03/11/22 NEW  2 Pt will be educated on lymphedema risk reduction- ABC class - and SOZO surveillance 12/11/21 MET  3 Pt will feel ind with continued HEP after DIEP flap 03/11/22 NEW  4 Pt will tolerate supine lying without limitations 03/11/22 NEW     PLAN: PT FREQUENCY/DURATION: 1x-2x per week for 6 weeks  PLAN FOR NEXT SESSION:  6 weeks: Patient may resume all aerobic and strengthening activities performed at their baseline  level  EXCEPT for abdominal exercises (03/11/22) 12+ weeks: Resume abdominal exercises. Examples of exercises to begin your abdominal  recruitment include pelvic tilts, bridging with abdominal recruitment, abdominal  crunches, reverse sit-ups, quadruped pelvic tilts and sitting w/ alternating UE/LE  extension   Shan Levans, PT  02/19/22 4:54 PM

## 2022-02-27 ENCOUNTER — Encounter: Payer: Self-pay | Admitting: Rehabilitation

## 2022-02-27 ENCOUNTER — Ambulatory Visit: Payer: BC Managed Care – PPO | Admitting: Rehabilitation

## 2022-02-27 DIAGNOSIS — R293 Abnormal posture: Secondary | ICD-10-CM

## 2022-02-27 DIAGNOSIS — Z483 Aftercare following surgery for neoplasm: Secondary | ICD-10-CM

## 2022-02-27 DIAGNOSIS — C50611 Malignant neoplasm of axillary tail of right female breast: Secondary | ICD-10-CM | POA: Diagnosis not present

## 2022-02-27 DIAGNOSIS — M25611 Stiffness of right shoulder, not elsewhere classified: Secondary | ICD-10-CM

## 2022-02-27 NOTE — Therapy (Signed)
OUTPATIENT PHYSICAL THERAPY BREAST CANCER TREATMENT   Patient Name: Rachel [name] MRN: [id number] DOB:03-Jan-1966, 57 y.o., female Today's Date: 02/27/2022   PT End of Session - 02/27/22 1722     Visit Number 7    Number of Visits 9    Date for PT Re-Evaluation 03/11/22    PT Start Time 1500    PT Stop Time 1553    PT Time Calculation (min) 53 min    Activity Tolerance Patient tolerated treatment well    Behavior During Therapy [facility] for tasks assessed/performed              Past Medical History:  Diagnosis Date   ASCUS of cervix with negative high risk HPV 03/2018   Bone pain due to G-CSF 08/23/2021   Cancer ([place]) 2001   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  Prior T score -2.5.  DEXA stable from prior study.   Personal history of chemotherapy 2001   Personal history of radiation therapy 2001   Pneumonia    Had 12-2017   PONV (postoperative nausea and vomiting)    Past Surgical History:  Procedure Laterality Date   BREAST EXCISIONAL BIOPSY Left 2021   BREAST LUMPECTOMY Right 2001   CESAREAN SECTION  1991   COLONOSCOPY     MASTECTOMY W/ SENTINEL NODE BIOPSY Right 06/25/2021   Procedure: RIGHT MASTECTOMY WITH SENTINEL LYMPH NODE BIOPSY;  Surgeon: Stark Klein, MD;  Location: [place];  Service: General;  Laterality: Right;   NASAL SINUS SURGERY Right 02/20/2020   Procedure: ENDOSCOPIC SINUS SURGERY WITH RIGHT ETHMOIDECTOMY AND RIGHT MAXILLARY OSTIA ENLARGEMENT WITH FUSION;  Surgeon: Rozetta Nunnery, MD;  Location: [place];  Service: ENT;  Laterality: Right;   PELVIC LAPAROSCOPY     endometriosis   PORTACATH PLACEMENT N/A 08/07/2021   Procedure: PORT  PLACEMENT WITH ULTRASOUND GUIDANCE;  Surgeon: Stark Klein, MD;  [facility];  Service: General;  Laterality: N/A;   SINUS ENDO WITH FUSION Right 02/20/2020   Procedure: SINUS ENDO WITH FUSION;  Surgeon: Rozetta Nunnery, MD;  Location: [place];  Service: ENT;  Laterality: Right;   [place]   Patient Active Problem List   Diagnosis Date Noted   Acute bacterial rhinosinusitis 01/09/2022   Bone pain due to G-CSF 08/23/2021   Cancer of overlapping sites of right female breast ([place]) 06/25/2021   Lateral epicondylitis, left elbow 03/04/2021   Left wrist pain 06/24/2019   Pain in joint of left shoulder 06/24/2019   Pain in joint of left elbow 06/24/2019   Family history of uterine cancer    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 Barry Dienes  REFERRING DIAG: Rt breast cancer  THERAPY DIAG:  Malignant neoplasm of axillary  tail of right breast in female, estrogen receptor negative ([place])  Abnormal posture  Aftercare following surgery for neoplasm  Stiffness of right shoulder, not elsewhere classified  Rationale for Evaluation and Treatment Rehabilitation  ONSET DATE: 06/03/21  SUBJECTIVE:                                                                                                                                                                                    SUBJECTIVE STATEMENT: I am just exhausted and it is just hurting.  I go back to the MD on Monday.     PERTINENT HISTORY:  Patient was diagnosed on 06/03/21 with right breast cancer recurrence. Initial lumpectomy, SLNB x 3, radiation, chemotherapy, and  antiestrogens. Rt mastectomy on 06/25/21 with Dr. Barry Dienes with another SLNB 0/3 positive lymph nodes. Rt DIEP flap and removal of port on 12/17/21.  Other hx: chiari malformation   PATIENT GOALS:  Reassess how my recovery is going related to arm function, pain, and swelling.  PAIN:  Are you having pain? 3/10 around the abdomen   PRECAUTIONS: Recent Surgery, right UE Lymphedema risk, 6 total lymph nodes removed x 2 breast cancers OBJECTIVE:  PATIENT SURVEYS:  QUICK DASH: 80%  OBSERVATIONS:  New DIEP flap healed well at the breast and abdominal incision.  Assessed abdominal incision gently  for skin mobility with some decreased movement lateral edges of the incision where pt said she had a drain from both edges.  General edema at the Rt breast.   POSTURE:  Slightly guarded - some discomfort lying flat with 1 pillow and legs bent up  LYMPHEDEMA ASSESSMENT:   UPPER EXTREMITY AROM/PROM:   A/PROM RIGHT  06/17/2021   07/18/21 07/30/21 08/06/21 09/10/21 10/16/21 11/06/21 01/28/22  Shoulder extension 53    50     Shoulder flexion 170 145 - feeling tight 155 158 - pull in axilla 160 - pull in the axilla 165 - pull in axilla  170   Shoulder abduction 170 114 130 - pectoralis muscle into chest 115 - pull in the pectoralis - some cording now noted  130 - pulling down arm  150 - pulling down the arm  168   Shoulder internal rotation           Shoulder external rotation 90    90                             (Blank rows = not tested)   CERVICAL AROM: All within normal limits:    LYMPHEDEMA ASSESSMENTS:    LANDMARK RIGHT  06/17/2021 6/8/23Rt   10 cm proximal to olecranon process  36.5 36.5   Olecranon process 23.5 23.5   10 cm proximal to ulnar styloid process 20.6 20.6   Just proximal to ulnar styloid process 15.3 15.3   Across hand at thumb web space 18 18   At base of 2nd digit 5.8 5.8   (Blank rows = not tested)   TODAY'S TREATMENT: 02/27/22: Therapeutic exercise: Pulleys into flexion and abduction  x 10mn each  Standing shoulder shrugs, scapular retractions, arm saw, single arm abduction bil, behind the back slidesx 10 each Swam dive motion with neck extension: getting up to around 100%  Standing yellow row 2x10 4" in and out Single arm ER x 10 bil  supine lying with knees fully extended placing hands on the incision with working on diaphragmatic breathing x 10 Manual Therapy PROM of the Rt shoulder into all directions with axillary pinning  STM to the pectoralis  02/19/22: Therapeutic exercise: Pulleys into flexion and abduction x 279m each  Standing shoulder shrugs, scapular retractions, arm saw, single arm abduction bil, behind the back slidesx 10 each Swam dive motion with neck extension: getting up to around 75% with arms  supine lying with knees fully extended placing hands on the incision with working on diaphragmatic breathing x 10 Manual Therapy PROM of the Rt shoulder into all directions with axillary pinning  STM to the pectoralis  02/13/22: Therapeutic exercise: Pulleys into flexion and abduction x [id number]  Ball roll up wall flexion x10 Standing shoulder shrugs, scapular retractions,x 10 each Seated yellow band row x 10 with slow movement focus, seated bil ER row yellow band x 10 slow focus Single arm yellow band shoulder ext x 10 slow focus and limited end Rt UE ROM due to increased pull reported at incisions supine lying with knees over green bolster to decrease trunk pull placing hands on the incision with working on diaphragmatic breathing x 10 Manual Therapy PROM of the Rt shoulder into all directions with axillary pinning  STM to the pectoralis   PATIENT EDUCATION:  Education details: updated HEP, POC Person educated: Patient Education method: Explanation, Demonstration, Tactile cues, and Handouts Education comprehension: verbalized understanding, returned demonstration, verbal cues required, tactile cues required, and needs further education  HOME  EXERCISE PROGRAM: After DIEP: breathing with scar skin sliding around incision only until 12 weeks. Supine lying Standing swan dive motion  ASSESSMENT CLINICAL IMPRESSION: Pt has continued soreness and pain but has easier movement today - able to do a full swan dive motion.  Pt will check in with MD next week and we will progress from theres.     Pt will benefit from skilled therapeutic intervention to improve on the following deficits: Decreased knowledge of precautions, impaired UE functional use, pain, decreased ROM, postural dysfunction.   PT treatment/interventions: ADL/Self care home management, Therapeutic exercises, Patient/Family education, and Manual therapy   GOALS: Goals reviewed with patient? Yes  LONG TERM GOALS:  (STG=LTG)  GOALS Name Target Date  Goal status  1 Pt will demonstrate she has regained full shoulder ROM and function post operatively compared to baselines.  Baseline: 03/11/22 NEW  2 Pt will be educated on lymphedema risk reduction- ABC class - and SOZO surveillance 12/11/21 MET  3 Pt will feel ind with continued HEP after DIEP flap 03/11/22 NEW  4 Pt will tolerate supine lying without limitations 03/11/22 NEW     PLAN: PT FREQUENCY/DURATION: 1x-2x per week for 6 weeks  PLAN FOR NEXT SESSION:  6 weeks: Patient may resume all aerobic  and strengthening activities performed at their baseline  level EXCEPT for abdominal exercises (03/11/22) 12+ weeks: Resume abdominal exercises. Examples of exercises to begin your abdominal  recruitment include pelvic tilts, bridging with abdominal recruitment, abdominal  crunches, reverse sit-ups, quadruped pelvic tilts and sitting w/ alternating UE/LE  extension   Shan Levans, PT  02/27/22 5:23 PM

## 2022-03-06 ENCOUNTER — Ambulatory Visit: Payer: BC Managed Care – PPO | Admitting: Rehabilitation

## 2022-03-07 ENCOUNTER — Other Ambulatory Visit: Payer: Self-pay | Admitting: Hematology & Oncology

## 2022-03-07 DIAGNOSIS — Z1231 Encounter for screening mammogram for malignant neoplasm of breast: Secondary | ICD-10-CM

## 2022-03-09 ENCOUNTER — Other Ambulatory Visit: Payer: Self-pay

## 2022-03-12 ENCOUNTER — Ambulatory Visit: Payer: BC Managed Care – PPO | Admitting: Rehabilitation

## 2022-03-12 ENCOUNTER — Encounter: Payer: Self-pay | Admitting: Rehabilitation

## 2022-03-12 DIAGNOSIS — Z483 Aftercare following surgery for neoplasm: Secondary | ICD-10-CM

## 2022-03-12 DIAGNOSIS — Z171 Estrogen receptor negative status [ER-]: Secondary | ICD-10-CM

## 2022-03-12 DIAGNOSIS — M25611 Stiffness of right shoulder, not elsewhere classified: Secondary | ICD-10-CM

## 2022-03-12 DIAGNOSIS — R293 Abnormal posture: Secondary | ICD-10-CM

## 2022-03-12 DIAGNOSIS — C50611 Malignant neoplasm of axillary tail of right female breast: Secondary | ICD-10-CM | POA: Diagnosis not present

## 2022-03-12 NOTE — Therapy (Addendum)
 OUTPATIENT PHYSICAL THERAPY BREAST CANCER TREATMENT   Patient Name: Rachel [name] MRN: [id number] DOB:Aug 06, 1965, 57 y.o., female Today's Date: 03/12/2022   PT End of Session - 03/12/22 0855     Visit Number 8    Number of Visits 9    Date for PT Re-Evaluation 03/11/22    PT Start Time 0803    PT Stop Time 0851    PT Time Calculation (min) 48 min    Activity Tolerance Patient tolerated treatment well    Behavior During Therapy [facility] for tasks assessed/performed              Past Medical History:  Diagnosis Date   ASCUS of cervix with negative high risk HPV 03/2018   Bone pain due to G-CSF 08/23/2021   Cancer (HCC) 2001   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  Prior T score -2.5.  DEXA stable from prior study.   Personal history of chemotherapy 2001   Personal history of radiation therapy 2001   Pneumonia    Had 12-2017   PONV (postoperative nausea and vomiting)    Past Surgical History:  Procedure Laterality Date   BREAST EXCISIONAL BIOPSY Left 2021   BREAST LUMPECTOMY Right 2001   CESAREAN SECTION  1991   COLONOSCOPY     MASTECTOMY W/ SENTINEL NODE BIOPSY Right 06/25/2021   Procedure: RIGHT MASTECTOMY WITH SENTINEL LYMPH NODE BIOPSY;  Surgeon: Almond Lint, MD;  Location: [facility];  Service: General;  Laterality: Right;   NASAL SINUS SURGERY Right 02/20/2020   Procedure: ENDOSCOPIC SINUS SURGERY WITH RIGHT ETHMOIDECTOMY AND RIGHT MAXILLARY OSTIA ENLARGEMENT WITH FUSION;  Surgeon: Drema Halon, MD;  Location: [facility];  Service: ENT;  Laterality: Right;   PELVIC LAPAROSCOPY     endometriosis   PORTACATH PLACEMENT N/A 08/07/2021   Procedure: PORT  PLACEMENT WITH ULTRASOUND GUIDANCE;  Surgeon: Almond Lint, MD;  Location: [facility];  Service: General;  Laterality: N/A;   SINUS ENDO WITH FUSION Right 02/20/2020   Procedure: SINUS ENDO WITH FUSION;  Surgeon: Drema Halon, MD;  Location: [facility];  Service: ENT;  Laterality: Right;   TONSILLECTOMY  1980   UPPER GI ENDOSCOPY     WISDOM TOOTH EXTRACTION  1986   Patient Active Problem List   Diagnosis Date Noted   Acute bacterial rhinosinusitis 01/09/2022   Bone pain due to G-CSF 08/23/2021   Cancer of overlapping sites of right female breast (HCC) 06/25/2021   Lateral epicondylitis, left elbow 03/04/2021   Left wrist pain 06/24/2019   Pain in joint of left shoulder 06/24/2019   Pain in joint of left elbow 06/24/2019   Family history of uterine cancer    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 Donell Beers  REFERRING DIAG: Rt breast cancer  THERAPY DIAG:  Malignant neoplasm of axillary  tail of right breast in female, estrogen receptor negative [facility])  Aftercare following surgery for neoplasm  Abnormal posture  Stiffness of right shoulder, not elsewhere classified  Rationale for Evaluation and Treatment Rehabilitation  ONSET DATE: 06/03/21  SUBJECTIVE:                                                                                                                                                                                    SUBJECTIVE STATEMENT: Dr Appt went well.  I will have my next surgery on 04/14/22 for bil second reconstruction and removal of seroma.   PERTINENT HISTORY:  Patient was diagnosed on 06/03/21 with right breast cancer recurrence. Initial lumpectomy, SLNB x 3,  radiation, chemotherapy, and antiestrogens. Rt mastectomy on 06/25/21 with Dr. Donell Beers with another SLNB 0/3 positive lymph nodes. Rt DIEP flap and removal of port on 12/17/21.  Other hx: chiari malformation   PATIENT GOALS:  Reassess how my recovery is going related to arm function, pain, and swelling.  PAIN:  Are you having pain? 0-3/10 around the abdomen   PRECAUTIONS: Recent Surgery, right UE Lymphedema risk, 6 total lymph nodes removed x 2 breast cancers OBJECTIVE:  PATIENT SURVEYS:  QUICK DASH: 80%  OBSERVATIONS:  New DIEP flap healed well at the breast and abdominal incision.  Assessed abdominal incision gently  for skin mobility with some decreased movement lateral edges of the incision where pt said she had a drain from both edges.  General edema at the Rt breast.   POSTURE:  Slightly guarded - some discomfort lying flat with 1 pillow and legs bent up  LYMPHEDEMA ASSESSMENT:   UPPER EXTREMITY AROM/PROM:   A/PROM RIGHT  06/17/2021   07/18/21 07/30/21 08/06/21 09/10/21 10/16/21 11/06/21 01/28/22  Shoulder extension 53    50     Shoulder flexion 170 145 - feeling tight 155 158 - pull in axilla 160 - pull in the axilla 165 - pull in axilla  170   Shoulder abduction 170 114 130 - pectoralis muscle into chest 115 - pull in the pectoralis - some cording now noted  130 - pulling down arm  150 - pulling down the arm  168   Shoulder internal rotation           Shoulder external rotation 90    90                             (Blank rows = not tested)   CERVICAL AROM: All within normal limits:    LYMPHEDEMA ASSESSMENTS:    LANDMARK RIGHT  06/17/2021 6/8/23Rt   10 cm proximal to olecranon  process 36.5 36.5   Olecranon process 23.5 23.5   10 cm proximal to ulnar styloid process 20.6 20.6   Just proximal to ulnar styloid process 15.3 15.3   Across hand at thumb web space 18 18   At base of 2nd digit 5.8 5.8   (Blank rows = not tested)   TODAY'S TREATMENT: 03/12/22: Therapeutic  exercise: Pulleys into flexion and abduction x each  Supine snow angels with available ROM x 10 supine lying with knees fully extended placing hands on the incision with working on diaphragmatic breathing x 10 LTR x 10 Supine legs out alternating flexion x 10 and alternating Y x 10 Manual Therapy PROM of the Rt shoulder into all directions with axillary pinning  STM to the pectoralis   02/27/22: Therapeutic exercise: Pulleys into flexion and abduction x each  Standing shoulder shrugs, scapular retractions, arm saw, single arm abduction bil, behind the back slidesx 10 each Swam dive motion with neck extension: getting up to around 100%  Standing yellow row 2x10 4" in and out Single arm ER x 10 bil  supine lying with knees fully extended placing hands on the incision with working on diaphragmatic breathing x 10 Manual Therapy PROM of the Rt shoulder into all directions with axillary pinning  STM to the pectoralis  02/19/22: Therapeutic exercise: Pulleys into flexion and abduction x each  Standing shoulder shrugs, scapular retractions, arm saw, single arm abduction bil, behind the back slidesx 10 each Swam dive motion with neck extension: getting up to around 75% with arms  supine lying with knees fully extended placing hands on the incision with working on diaphragmatic breathing x 10 Manual Therapy PROM of the Rt shoulder into all directions with axillary pinning  STM to the pectoralis   PATIENT EDUCATION:  Education details: updated HEP, POC Person educated: Patient Education method: Programmer, multimedia, Demonstration, Tactile cues, and Handouts Education comprehension: verbalized understanding, returned demonstration, verbal cues required, tactile cues required, and needs further education  HOME EXERCISE PROGRAM: After DIEP: breathing with scar skin sliding around incision only until 12 weeks. Supine lying Standing swan dive motion  ASSESSMENT CLINICAL IMPRESSION: Pt  has continued soreness and pain but has easier movement today compared to 2 weeks ago.  We will not change TE much until healed from next surgery.  Pt may continue at home all current TE and pulleys but may return if she feels like she needs to.      Pt will benefit from skilled therapeutic intervention to improve on the following deficits: Decreased knowledge of precautions, impaired UE functional use, pain, decreased ROM, postural dysfunction.   PT treatment/interventions: ADL/Self care home management, Therapeutic exercises, Patient/Family education, and Manual therapy   GOALS: Goals reviewed with patient? Yes  LONG TERM GOALS:  (STG=LTG)  GOALS Name Target Date  Goal status  1 Pt will demonstrate she has regained full shoulder ROM and function post operatively compared to baselines.  Baseline: 03/11/22 NEW  2 Pt will be educated on lymphedema risk reduction- ABC class - and SOZO surveillance 12/11/21 MET  3 Pt will feel ind with continued HEP after DIEP flap 03/11/22 MET for initial  4 Pt will tolerate supine lying without limitations 03/11/22 MET     PLAN: PT FREQUENCY/DURATION: 1x-2x per week for 6 weeks  PLAN FOR NEXT SESSION:  6 weeks: Patient may resume all aerobic and strengthening activities performed at their baseline  level EXCEPT for abdominal exercises (03/11/22) 12+ weeks: Resume abdominal exercises.  Examples of exercises to begin your abdominal  recruitment include pelvic tilts, bridging with abdominal recruitment, abdominal  crunches, reverse sit-ups, quadruped pelvic tilts and sitting w/ alternating UE/LE  extension   Gwenevere Abbot, PT  03/12/22 8:56 AM  PHYSICAL THERAPY DISCHARGE SUMMARY  Visits from Start of Care: 8  Current functional level related to goals / functional outcomes: Per above   Remaining deficits: lymphedema risk   Education / Equipment: Self care HEP   Plan: Patient agrees to discharge.

## 2022-03-28 ENCOUNTER — Other Ambulatory Visit: Payer: Self-pay

## 2022-04-18 ENCOUNTER — Ambulatory Visit: Payer: BC Managed Care – PPO | Admitting: Hematology & Oncology

## 2022-04-18 ENCOUNTER — Inpatient Hospital Stay: Payer: BC Managed Care – PPO

## 2022-04-21 ENCOUNTER — Ambulatory Visit: Payer: BC Managed Care – PPO

## 2022-05-01 ENCOUNTER — Ambulatory Visit
Admission: RE | Admit: 2022-05-01 | Discharge: 2022-05-01 | Disposition: A | Discharge status: Home / Self Care | Payer: BC Managed Care – PPO | Source: Ambulatory Visit | Attending: Hematology & Oncology | Admitting: Hematology & Oncology

## 2022-05-01 DIAGNOSIS — Z1231 Encounter for screening mammogram for malignant neoplasm of breast: Secondary | ICD-10-CM

## 2022-05-02 ENCOUNTER — Other Ambulatory Visit: Payer: Self-pay | Admitting: Hematology & Oncology

## 2022-05-02 DIAGNOSIS — N6320 Unspecified lump in the left breast, unspecified quadrant: Secondary | ICD-10-CM

## 2022-05-12 ENCOUNTER — Encounter: Payer: Self-pay | Admitting: Hematology & Oncology

## 2022-05-12 ENCOUNTER — Ambulatory Visit
Admission: RE | Admit: 2022-05-12 | Discharge: 2022-05-12 | Disposition: A | Discharge status: Home / Self Care | Payer: BC Managed Care – PPO | Source: Ambulatory Visit | Attending: Hematology & Oncology | Admitting: Hematology & Oncology

## 2022-05-12 ENCOUNTER — Other Ambulatory Visit: Payer: Self-pay

## 2022-05-12 ENCOUNTER — Inpatient Hospital Stay: Payer: BC Managed Care – PPO | Attending: Hematology & Oncology

## 2022-05-12 ENCOUNTER — Telehealth: Payer: Self-pay

## 2022-05-12 ENCOUNTER — Inpatient Hospital Stay (HOSPITAL_BASED_OUTPATIENT_CLINIC_OR_DEPARTMENT_OTHER): Payer: BC Managed Care – PPO | Admitting: Hematology & Oncology

## 2022-05-12 VITALS — BP 114/65 | HR 59 | Temp 98.1°F | Resp 16 | Ht 68.0 in | Wt 141.0 lb

## 2022-05-12 DIAGNOSIS — N6320 Unspecified lump in the left breast, unspecified quadrant: Secondary | ICD-10-CM

## 2022-05-12 DIAGNOSIS — Z171 Estrogen receptor negative status [ER-]: Secondary | ICD-10-CM

## 2022-05-12 DIAGNOSIS — Z170 Estrogen receptor positive status [ER+]: Secondary | ICD-10-CM | POA: Diagnosis not present

## 2022-05-12 DIAGNOSIS — C50911 Malignant neoplasm of unspecified site of right female breast: Secondary | ICD-10-CM | POA: Insufficient documentation

## 2022-05-12 DIAGNOSIS — C50611 Malignant neoplasm of axillary tail of right female breast: Secondary | ICD-10-CM | POA: Diagnosis not present

## 2022-05-12 DIAGNOSIS — Z9011 Acquired absence of right breast and nipple: Secondary | ICD-10-CM | POA: Insufficient documentation

## 2022-05-12 DIAGNOSIS — Z79811 Long term (current) use of aromatase inhibitors: Secondary | ICD-10-CM | POA: Diagnosis not present

## 2022-05-12 DIAGNOSIS — Z79899 Other long term (current) drug therapy: Secondary | ICD-10-CM | POA: Diagnosis not present

## 2022-05-12 DIAGNOSIS — E034 Atrophy of thyroid (acquired): Secondary | ICD-10-CM

## 2022-05-12 LAB — CBC WITH DIFFERENTIAL (CANCER CENTER ONLY)
Abs Immature Granulocytes: 0.01 10*3/uL (ref 0.00–0.07)
Basophils Absolute: 0 10*3/uL (ref 0.0–0.1)
Basophils Relative: 1 %
Eosinophils Absolute: 0.1 10*3/uL (ref 0.0–0.5)
Eosinophils Relative: 2 %
HCT: 40.8 % (ref 36.0–46.0)
Hemoglobin: 13.2 g/dL (ref 12.0–15.0)
Immature Granulocytes: 0 %
Lymphocytes Relative: 60 %
Lymphs Abs: 2.9 10*3/uL (ref 0.7–4.0)
MCH: 28.9 pg (ref 26.0–34.0)
MCHC: 32.4 g/dL (ref 30.0–36.0)
MCV: 89.5 fL (ref 80.0–100.0)
Monocytes Absolute: 0.3 10*3/uL (ref 0.1–1.0)
Monocytes Relative: 7 %
Neutro Abs: 1.5 10*3/uL — ABNORMAL LOW (ref 1.7–7.7)
Neutrophils Relative %: 30 %
Platelet Count: 211 10*3/uL (ref 150–400)
RBC: 4.56 MIL/uL (ref 3.87–5.11)
RDW: 15 % (ref 11.5–15.5)
WBC Count: 4.9 10*3/uL (ref 4.0–10.5)
nRBC: 0 % (ref 0.0–0.2)

## 2022-05-12 LAB — CMP (CANCER CENTER ONLY)
ALT: 15 U/L (ref 0–44)
AST: 18 U/L (ref 15–41)
Albumin: 4.4 g/dL (ref 3.5–5.0)
Alkaline Phosphatase: 83 U/L (ref 38–126)
Anion gap: 7 (ref 5–15)
BUN: 19 mg/dL (ref 6–20)
CO2: 28 mmol/L (ref 22–32)
Calcium: 9.6 mg/dL (ref 8.9–10.3)
Chloride: 108 mmol/L (ref 98–111)
Creatinine: 0.78 mg/dL (ref 0.44–1.00)
GFR, Estimated: >60 mL/min (ref >60)
Glucose, Bld: 107 mg/dL — ABNORMAL HIGH (ref 70–99)
Potassium: 4.7 mmol/L (ref 3.5–5.1)
Sodium: 143 mmol/L (ref 135–145)
Total Bilirubin: 0.5 mg/dL (ref 0.3–1.2)
Total Protein: 6.3 g/dL — ABNORMAL LOW (ref 6.5–8.1)

## 2022-05-12 LAB — TSH: TSH: 0.304 u[IU]/mL — ABNORMAL LOW (ref 0.350–4.500)

## 2022-05-12 LAB — LACTATE DEHYDROGENASE: LDH: 138 U/L (ref 98–192)

## 2022-05-12 MED ORDER — EXEMESTANE 25 MG PO TABS: 25.0000 mg | ORAL_TABLET | Freq: Every day | ORAL | 12 refills | Status: DC

## 2022-05-12 NOTE — Progress Notes (Signed)
Hematology and Oncology Follow Up Visit  Rachel [name] [facility] LW:[id number] 10/24/1965 57 y.o. 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 daily-start on 05/13/2022     Interim History:  Ms.  Goertzen is back for followup.  We last saw her back in December.  Since then, she has had another surgery for the breast reconstruction.  She hopefully will not need any further surgeries for the right breast reconstruction.  She is deciding about an aromatase inhibitor.  I really think that this would benefit her.  I think that Aromasin would not be a bad idea for her.  She and her husband had a nice Easter with her family.  There is a good weekend.  She will get back to school I think tomorrow or Wednesday.  She has been off a while because of the surgeries.  She does have some hot flashes.  There is no change in bowel or bladder habits.  She has had no rashes.  She has had no nausea or vomiting.  She has had no bleeding.  Overall, I would say that her performance status is probably ECOG 1.    Medications:  Current Outpatient Medications:    oxyCODONE (OXY IR/ROXICODONE) 5 MG immediate release tablet, Take 5 mg by mouth every 6 (six) hours as needed for moderate pain., Disp: , Rfl:    acetaminophen (TYLENOL) 500 MG tablet, Take 1,000 mg by mouth every 6 (six) hours as needed for moderate pain or mild pain., Disp: , Rfl:    butalbital-acetaminophen-caffeine (FIORICET, ESGIC) 50-325-40 MG per tablet, TAKE 1 TABLET BY MOUTH EVERY 6 HOURS AS NEEDED FOR HEADACHE, Disp: 20 tablet, Rfl: 0   Cholecalciferol (VITAMIN D PO), Take 1,000 Units by mouth daily., Disp: , Rfl:    fluticasone (FLONASE) 50 MCG/ACT nasal spray, INHALE 2 SPRASY IN EACH NOSTRIL ONCE DAILY,  Disp: 16 g, Rfl: 11   ibuprofen (ADVIL) 600 MG tablet, Take 600 mg by mouth every 8 (eight) hours., Disp: , Rfl:    pantoprazole (PROTONIX) 40 MG tablet, TAKE 1 TABLET BY MOUTH EVERY DAY, Disp: 90 tablet, Rfl: 3   promethazine (PHENERGAN) 25 MG tablet, TAKE 1 TABLET BY MOUTH EVERY 8 HOURS AS NEEDED FOR NAUSEA/VOMITING, Disp: 20 tablet, Rfl: 0   rizatriptan (MAXALT) 10 MG tablet, TAKE 1 TABLET BY MOUTH EVERY DAY AS NEEDED FOR MIGRANE, Disp: 6 tablet, Rfl: 5   UNITHROID 112 MCG tablet, TAKE 1 TABLET BY MOUTH EVERY DAY BEFORE BREAKFAST, Disp: 90 tablet, Rfl: 1  Current Facility-Administered Medications:    ondansetron (ZOFRAN) injection 8 mg, 8 mg, Intravenous, Once, Cecila Satcher, Rudell Cobb, MD  Allergies:  Allergies  Allergen Reactions   Anesthetics, Ester Nausea And Vomiting   Erythromycin Nausea And Vomiting    Past Medical History, Surgical history, Social history, and Family History were reviewed and updated.  Review of Systems: Review of Systems  Constitutional: Negative.   HENT:  Positive for congestion.   Eyes: Negative.   Respiratory: Negative.    Cardiovascular: Negative.   Gastrointestinal: Negative.   Genitourinary: Negative.   Musculoskeletal: Negative.   Skin: Negative.   Neurological: Negative.   Endo/Heme/Allergies: Negative.   Psychiatric/Behavioral: Negative.       Physical Exam:  height is 5\' 8"  (1.727  m) and weight is 141 lb (64 kg). Her oral temperature is 98.1 F (36.7 C). Her blood pressure is 114/65 and her pulse is 59 (abnormal). Her respiration is 16 and oxygen saturation is 100%.   Physical Exam Vitals reviewed.  Constitutional:      Comments: Her breast exam shows right mastectomy.  She has reconstruction on this now.  This is healing.  There is no erythema.  There is no warmth or swelling.  .  There is no right axillary adenopathy.  Left breast is unremarkable.  There is no mass in the left breast.  There is no left axillary adenopathy.    HENT:      Head: Normocephalic and atraumatic.  Eyes:     Pupils: Pupils are equal, round, and reactive to light.  Cardiovascular:     Rate and Rhythm: Normal rate and regular rhythm.     Heart sounds: Normal heart sounds.  Pulmonary:     Effort: Pulmonary effort is normal.     Breath sounds: Normal breath sounds.  Abdominal:     General: Bowel sounds are normal.     Palpations: Abdomen is soft.     Comments: Abdominal exam shows a well-healed reconstruction scar in the pelvic area.  She has a umbilical surgery scar that is healing.  There is no masses.  There is no tenderness.  There is no fluid wave.  Musculoskeletal:        General: No tenderness or deformity. Normal range of motion.     Cervical back: Normal range of motion.  Lymphadenopathy:     Cervical: No cervical adenopathy.  Skin:    General: Skin is warm and dry.     Findings: No erythema or rash.  Neurological:     Mental Status: She is alert and oriented to person, place, and time.  Psychiatric:        Behavior: Behavior normal.        Thought Content: Thought content normal.        Judgment: Judgment normal.    Lab Results  Component Value Date   WBC 5.4 01/17/2022   HGB 12.6 01/17/2022   HCT 39.7 01/17/2022   MCV 93.4 01/17/2022   PLT 207 01/17/2022     Chemistry      Component Value Date/Time   NA 145 01/17/2022 0833   NA 140 09/26/2016 0743   NA 143 09/21/2015 0854   K 4.5 01/17/2022 0833   K 3.8 09/26/2016 0743   K 4.4 09/21/2015 0854   CL 107 01/17/2022 0833   CL 104 09/26/2016 0743   CO2 31 01/17/2022 0833   CO2 30 09/26/2016 0743   CO2 28 09/21/2015 0854   BUN 19 01/17/2022 0833   BUN 14 09/26/2016 0743   BUN 13.9 09/21/2015 0854   CREATININE 0.82 01/17/2022 0833   CREATININE 0.87 01/01/2018 1055   CREATININE 0.8 09/21/2015 0854      Component Value Date/Time   CALCIUM 9.9 01/17/2022 0833   CALCIUM 9.4 09/26/2016 0743   CALCIUM 10.1 09/21/2015 0854   ALKPHOS 81 01/17/2022 0833   ALKPHOS 80  09/26/2016 0743   ALKPHOS 98 09/21/2015 0854   AST 15 01/17/2022 0833   AST 37 (H) 09/21/2015 0854   ALT 15 01/17/2022 0833   ALT 35 09/26/2016 0743   ALT 57 (H) 09/21/2015 0854   BILITOT 0.4 01/17/2022 0833   BILITOT 0.53 09/21/2015 0854      Impression and Plan: Ms. Germani is  a 57 year old white female with a history of stage I ductal carcinoma of the right breast. She was diagnosed 22 years ago. Her tumor was ER positive. I  think that  she is cured.  Now, she has a second breast cancer.  Again I feel this is not a recurrence in the breast.  I feel this is a second breast cancer.  Again, she completed her adjuvant therapy.   We will go ahead and get her started on Aromasin now.  I think 25 mg a day would be reasonable.  I would like to see her back in 6 weeks.  Hopefully, I will be doing well.  If not, then we will just stop the Aromasin.  It might make the hot flash little bit more prominent.   Volanda Napoleon, MD 4/1/20249:08 AM

## 2022-05-12 NOTE — Telephone Encounter (Signed)
Advised via MyChart.

## 2022-05-12 NOTE — Telephone Encounter (Signed)
-----   Message from Volanda Napoleon, MD sent at 05/12/2022  2:47 PM EDT ----- Please call let her know that the thyroid level is okay.  Thanks.  Laurey Arrow

## 2022-05-13 ENCOUNTER — Other Ambulatory Visit: Payer: Self-pay

## 2022-05-20 ENCOUNTER — Encounter (HOSPITAL_BASED_OUTPATIENT_CLINIC_OR_DEPARTMENT_OTHER): Payer: Self-pay | Admitting: Obstetrics & Gynecology

## 2022-05-20 ENCOUNTER — Other Ambulatory Visit (HOSPITAL_COMMUNITY)
Admission: RE | Admit: 2022-05-20 | Discharge: 2022-05-20 | Disposition: A | Discharge status: Home / Self Care | Payer: BC Managed Care – PPO | Source: Ambulatory Visit | Attending: Obstetrics & Gynecology | Admitting: Obstetrics & Gynecology

## 2022-05-20 ENCOUNTER — Ambulatory Visit (INDEPENDENT_AMBULATORY_CARE_PROVIDER_SITE_OTHER): Payer: BC Managed Care – PPO | Admitting: Obstetrics & Gynecology

## 2022-05-20 VITALS — BP 110/60 | HR 86 | Ht 64.0 in | Wt 142.4 lb

## 2022-05-20 DIAGNOSIS — Z124 Encounter for screening for malignant neoplasm of cervix: Secondary | ICD-10-CM

## 2022-05-20 DIAGNOSIS — Z01419 Encounter for gynecological examination (general) (routine) without abnormal findings: Secondary | ICD-10-CM | POA: Diagnosis not present

## 2022-05-20 DIAGNOSIS — Z8049 Family history of malignant neoplasm of other genital organs: Secondary | ICD-10-CM

## 2022-05-20 DIAGNOSIS — Z171 Estrogen receptor negative status [ER-]: Secondary | ICD-10-CM

## 2022-05-20 DIAGNOSIS — N393 Stress incontinence (female) (male): Secondary | ICD-10-CM

## 2022-05-20 DIAGNOSIS — M8588 Other specified disorders of bone density and structure, other site: Secondary | ICD-10-CM | POA: Diagnosis not present

## 2022-05-20 DIAGNOSIS — C50611 Malignant neoplasm of axillary tail of right female breast: Secondary | ICD-10-CM

## 2022-05-20 NOTE — Progress Notes (Signed)
57 y.o. [id number] Married White or Caucasian female here for annual exam.  Had invasive ductal carcinoma with negative lymph nodes.  Oncotype testing 23/24 so did have chemotherapy.  Had reconstruction in [place] and reduction on the left.  Most recent left mammogram showed an abnormality that is fat necrosis.  Has 6 month follow up recommended.  Being followed by Dr. Myna HidalgoEnnever.  Aromatase inhibitor has been discussed.  Aromasin 25mg  recommended.  She hasn't started this yet.  Has some osteopenia in her spine.    No LMP recorded (lmp unknown). Patient is postmenopausal.          Sexually active: Yes.    The current method of family planning is post menopausal status.    Smoker:  no  Health Maintenance: Pap:  05/09/2021 History of abnormal Pap:  no MMG:  04/2022 Colonoscopy:  02/2018.  Adenomatous polyps.  Follow up 5 years. BMD:   2023 Screening Labs: done currently with oncology   reports that she has never smoked. She has never used smokeless tobacco. She reports that she does not currently use alcohol. She reports that she does not use drugs.  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  Prior T score -2.5.  DEXA stable from prior study.   Personal history of chemotherapy 2001   Personal history of radiation therapy 2001   Pneumonia    Had 12-2017   PONV (postoperative nausea and vomiting)     Past Surgical History:  Procedure Laterality Date   BREAST EXCISIONAL BIOPSY Left 2021   BREAST LUMPECTOMY Right 2001   CESAREAN SECTION  1991   COLONOSCOPY     MASTECTOMY W/ SENTINEL  NODE BIOPSY Right 06/25/2021   Procedure: RIGHT MASTECTOMY WITH SENTINEL LYMPH NODE BIOPSY;  Surgeon: Almond LintByerly, Faera, MD;  Location: [facility];  Service: General;  Laterality: Right;   NASAL SINUS SURGERY Right 02/20/2020   Procedure: ENDOSCOPIC SINUS SURGERY WITH RIGHT ETHMOIDECTOMY AND RIGHT MAXILLARY OSTIA ENLARGEMENT WITH FUSION;  Surgeon: Drema HalonNewman, Christopher E, MD;  Location: [facility];  Service: ENT;  Laterality: Right;   PELVIC LAPAROSCOPY     endometriosis   PORTACATH PLACEMENT N/A 08/07/2021   Procedure: PORT PLACEMENT WITH ULTRASOUND GUIDANCE;  Surgeon: Almond LintByerly, Faera, MD;  Location: [facility];  Service: General;  Laterality: N/A;   SINUS ENDO WITH FUSION Right 02/20/2020   Procedure: SINUS ENDO WITH FUSION;  Surgeon: Drema HalonNewman, Christopher E, MD;  Location: [facility];  Service: ENT;  Laterality: Right;   TONSILLECTOMY  1980   UPPER GI ENDOSCOPY     WISDOM TOOTH EXTRACTION  1986    Current Outpatient Medications  Medication Sig Dispense Refill   acetaminophen (TYLENOL) 500 MG tablet Take 1,000 mg by mouth every 6 (six) hours as needed for moderate pain or mild pain.     butalbital-acetaminophen-caffeine (FIORICET, ESGIC) 50-325-40 MG per tablet TAKE 1 TABLET BY MOUTH EVERY 6 HOURS AS NEEDED FOR HEADACHE 20 tablet 0  Cholecalciferol (VITAMIN D PO) Take 1,000 Units by mouth daily.     exemestane (AROMASIN) 25 MG tablet Take 1 tablet (25 mg total) by mouth daily after breakfast. 30 tablet 12   fluticasone (FLONASE) 50 MCG/ACT nasal spray INHALE 2 SPRASY IN EACH NOSTRIL ONCE DAILY 16 g 11   ibuprofen (ADVIL) 600 MG tablet Take 600 mg by mouth every 8 (eight) hours.     oxyCODONE (OXY IR/ROXICODONE) 5 MG immediate release tablet Take 5 mg by mouth every 6 (six) hours as needed for moderate pain.     pantoprazole (PROTONIX) 40 MG tablet TAKE 1 TABLET BY MOUTH EVERY DAY 90 tablet 3   promethazine (PHENERGAN) 25 MG tablet TAKE 1 TABLET BY MOUTH EVERY  8 HOURS AS NEEDED FOR NAUSEA/VOMITING 20 tablet 0   rizatriptan (MAXALT) 10 MG tablet TAKE 1 TABLET BY MOUTH EVERY DAY AS NEEDED FOR MIGRANE 6 tablet 5   UNITHROID 112 MCG tablet TAKE 1 TABLET BY MOUTH EVERY DAY BEFORE BREAKFAST 90 tablet 1   Current Facility-Administered Medications  Medication Dose Route Frequency Provider Last Rate Last Admin   ondansetron (ZOFRAN) injection 8 mg  8 mg Intravenous Once Ennever, Rose Phi, MD        Family History  Problem Relation Age of Onset   Diabetes Father    Uterine cancer Maternal Aunt 60   Colon polyps Neg Hx    Colon cancer Neg Hx    Esophageal cancer Neg Hx    Stomach cancer Neg Hx    Rectal cancer Neg Hx     ROS: Constitutional: negative Genitourinary:negative  Exam:   BP 110/60 (BP Location: Left Arm, Patient Position: Sitting, Cuff Size: Large)   Pulse 86   Ht  (1.626 m) Comment: reported  Wt 142 lb 6.4 oz (64.6 kg)   LMP  (LMP Unknown)   BMI 24.44 kg/m   Height:  (162.6 cm) (reported)  General appearance: alert, cooperative and appears stated age Head: Normocephalic, without obvious abnormality, atraumatic Neck: no adenopathy, supple, symmetrical, trachea midline and thyroid normal to inspection and palpation Lungs: clear to auscultation bilaterally Breasts: normal appearance, no masses or tenderness, well healed scars on right from reconstruction Heart: regular rate and rhythm Abdomen: soft, non-tender; bowel sounds normal; no masses,  no organomegaly Extremities: extremities normal, atraumatic, no cyanosis or edema Skin: Skin color, texture, turgor normal. No rashes or lesions Lymph nodes: Cervical, supraclavicular, and axillary nodes normal. No abnormal inguinal nodes palpated Neurologic: Grossly normal   Pelvic: External genitalia:  no lesions              Urethra:  normal appearing urethra with no masses, tenderness or lesions              Bartholins and Skenes: normal                 Vagina: normal  appearing vagina with normal color and no discharge, no lesions              Cervix: no lesions              Pap taken: yes Bimanual Exam:  Uterus:  normal size, contour, position, consistency, mobility, non-tender              Adnexa: normal adnexa and no mass, fullness, tenderness               Rectovaginal: Confirms  Anus:  normal sphincter tone, no lesions  Chaperone, Ina Homes, CMA, was present for exam.  Assessment/Plan: 1. Well woman exam with routine gynecological exam - Pap smear 05/09/2021 - Mammogram 04/2022 - Colonoscopy 02/2018.  Follow up 5 years. - Bone mineral density 2023 - lab work done with PCP, Dr. Tanya Nones and Dr. Myna Hidalgo - vaccines reviewed/updated  2. SUI (stress urinary incontinence, female) - pelvic PT discussed.  She will let me know if desires referral  3. 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 Myna Hidalgo  4. Osteopenia of spine  5. Family history of uterine cancer  6. Cervical cancer screening - Cytology - PAP( [place]).  Desires pap smear today.

## 2022-05-20 NOTE — Patient Instructions (Addendum)
Replens vaginal moisturizer--twice weekly Coconut oil Gynetrof(ph)-- Vite and hyaluronic acid Vit E vaginal suppository -- key E  (we can have this compounded at [facility]) Revaree--hyaluronic acid vaginal suppositories  [place]oz[place]--hot flashes

## 2022-05-23 LAB — CYTOLOGY - PAP: Diagnosis: NEGATIVE

## 2022-05-28 ENCOUNTER — Ambulatory Visit (HOSPITAL_BASED_OUTPATIENT_CLINIC_OR_DEPARTMENT_OTHER): Payer: BC Managed Care – PPO | Admitting: Obstetrics & Gynecology

## 2022-06-07 ENCOUNTER — Other Ambulatory Visit: Payer: Self-pay

## 2022-06-20 ENCOUNTER — Other Ambulatory Visit: Payer: Self-pay

## 2022-06-23 ENCOUNTER — Inpatient Hospital Stay: Payer: BC Managed Care – PPO

## 2022-06-23 ENCOUNTER — Inpatient Hospital Stay: Payer: BC Managed Care – PPO | Admitting: Hematology & Oncology

## 2022-06-30 ENCOUNTER — Other Ambulatory Visit: Payer: Self-pay

## 2022-07-14 ENCOUNTER — Inpatient Hospital Stay: Payer: BC Managed Care – PPO | Attending: Hematology & Oncology

## 2022-07-14 ENCOUNTER — Encounter: Payer: Self-pay | Admitting: Hematology & Oncology

## 2022-07-14 ENCOUNTER — Inpatient Hospital Stay: Payer: BC Managed Care – PPO | Admitting: Hematology & Oncology

## 2022-07-14 VITALS — BP 113/52 | HR 65 | Temp 98.1°F | Resp 17 | Wt 140.8 lb

## 2022-07-14 DIAGNOSIS — Z170 Estrogen receptor positive status [ER+]: Secondary | ICD-10-CM | POA: Insufficient documentation

## 2022-07-14 DIAGNOSIS — C50611 Malignant neoplasm of axillary tail of right female breast: Secondary | ICD-10-CM

## 2022-07-14 DIAGNOSIS — Z9221 Personal history of antineoplastic chemotherapy: Secondary | ICD-10-CM | POA: Diagnosis not present

## 2022-07-14 DIAGNOSIS — Z79811 Long term (current) use of aromatase inhibitors: Secondary | ICD-10-CM | POA: Diagnosis not present

## 2022-07-14 DIAGNOSIS — C50911 Malignant neoplasm of unspecified site of right female breast: Secondary | ICD-10-CM | POA: Insufficient documentation

## 2022-07-14 DIAGNOSIS — Z79899 Other long term (current) drug therapy: Secondary | ICD-10-CM | POA: Diagnosis not present

## 2022-07-14 DIAGNOSIS — Z171 Estrogen receptor negative status [ER-]: Secondary | ICD-10-CM

## 2022-07-14 DIAGNOSIS — Z9011 Acquired absence of right breast and nipple: Secondary | ICD-10-CM | POA: Diagnosis not present

## 2022-07-14 LAB — CBC WITH DIFFERENTIAL (CANCER CENTER ONLY)
Abs Immature Granulocytes: 0.01 10*3/uL (ref 0.00–0.07)
Basophils Absolute: 0.1 10*3/uL (ref 0.0–0.1)
Basophils Relative: 1 %
Eosinophils Absolute: 0.1 10*3/uL (ref 0.0–0.5)
Eosinophils Relative: 2 %
HCT: 42.6 % (ref 36.0–46.0)
Hemoglobin: 13.9 g/dL (ref 12.0–15.0)
Immature Granulocytes: 0 %
Lymphocytes Relative: 54 %
Lymphs Abs: 2.8 10*3/uL (ref 0.7–4.0)
MCH: 30 pg (ref 26.0–34.0)
MCHC: 32.6 g/dL (ref 30.0–36.0)
MCV: 91.8 fL (ref 80.0–100.0)
Monocytes Absolute: 0.4 10*3/uL (ref 0.1–1.0)
Monocytes Relative: 7 %
Neutro Abs: 1.9 10*3/uL (ref 1.7–7.7)
Neutrophils Relative %: 36 %
Platelet Count: 249 10*3/uL (ref 150–400)
RBC: 4.64 MIL/uL (ref 3.87–5.11)
RDW: 13.2 % (ref 11.5–15.5)
WBC Count: 5.2 10*3/uL (ref 4.0–10.5)
nRBC: 0 % (ref 0.0–0.2)

## 2022-07-14 LAB — CMP (CANCER CENTER ONLY)
ALT: 14 U/L (ref 0–44)
AST: 17 U/L (ref 15–41)
Albumin: 4.6 g/dL (ref 3.5–5.0)
Alkaline Phosphatase: 91 U/L (ref 38–126)
Anion gap: 7 (ref 5–15)
BUN: 23 mg/dL — ABNORMAL HIGH (ref 6–20)
CO2: 31 mmol/L (ref 22–32)
Calcium: 10.1 mg/dL (ref 8.9–10.3)
Chloride: 108 mmol/L (ref 98–111)
Creatinine: 0.92 mg/dL (ref 0.44–1.00)
GFR, Estimated: >60 mL/min (ref >60)
Glucose, Bld: 110 mg/dL — ABNORMAL HIGH (ref 70–99)
Potassium: 4.8 mmol/L (ref 3.5–5.1)
Sodium: 146 mmol/L — ABNORMAL HIGH (ref 135–145)
Total Bilirubin: 0.6 mg/dL (ref 0.3–1.2)
Total Protein: 7.1 g/dL (ref 6.5–8.1)

## 2022-07-14 LAB — LACTATE DEHYDROGENASE: LDH: 146 U/L (ref 98–192)

## 2022-07-14 LAB — TSH: TSH: 0.122 u[IU]/mL — ABNORMAL LOW (ref 0.350–4.500)

## 2022-07-14 NOTE — Progress Notes (Signed)
Hematology and Oncology Follow Up Visit  Rachel [name] [facility] [id number] 27-May-1965 57 y.o. 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 daily-start on 05/13/2022     Interim History:  [name] is back for followup.  Rachel [name] is doing pretty well.  Rachel [name] is on Aromasin right now.  Rachel [name] has had no problems with the Aromasin.  Rachel [name] says that her left breast does seem to hurt a little bit since taking the Aromasin.  There is been no swelling.  Rachel [name] is still working.  Rachel [name] works at one of the [employer] in [place].  They just had a graduation ceremony.  Rachel [name] has had no fever fever.  Rachel [name] does have some hot flashes.  Rachel [name] has had no change in bowel or bladder habits.  Rachel [name] has had no rashes.  There is been no leg swelling.  Currently, I would have said that her performance status is probably ECOG 1.    Medications:  Current Outpatient Medications:    acetaminophen (TYLENOL) 500 MG tablet, Take 1,000 mg by mouth every 6 (six) hours as needed for moderate pain or mild pain., Disp: , Rfl:    butalbital-acetaminophen-caffeine (FIORICET, ESGIC) 50-325-40 MG per tablet, TAKE 1 TABLET BY MOUTH EVERY 6 HOURS AS NEEDED FOR HEADACHE, Disp: 20 tablet, Rfl: 0   Cholecalciferol (VITAMIN D PO), Take 1,000 Units by mouth daily., Disp: , Rfl:    exemestane (AROMASIN) 25 MG tablet, Take 1 tablet (25 mg total) by mouth daily after breakfast., Disp: 30 tablet, Rfl: 12   fluticasone (FLONASE) 50 MCG/ACT nasal spray, INHALE 2 SPRASY IN EACH NOSTRIL ONCE DAILY, Disp: 16 g, Rfl: 11   ibuprofen (ADVIL) 600 MG tablet, Take 600 mg by mouth every 8 (eight) hours., Disp: , Rfl:    pantoprazole (PROTONIX) 40 MG tablet, TAKE 1 TABLET BY MOUTH EVERY DAY, Disp: 90 tablet,  Rfl: 3   promethazine (PHENERGAN) 25 MG tablet, TAKE 1 TABLET BY MOUTH EVERY 8 HOURS AS NEEDED FOR NAUSEA/VOMITING, Disp: 20 tablet, Rfl: 0   rizatriptan (MAXALT) 10 MG tablet, TAKE 1 TABLET BY MOUTH EVERY DAY AS NEEDED FOR MIGRANE, Disp: 6 tablet, Rfl: 5   UNITHROID 112 MCG tablet, TAKE 1 TABLET BY MOUTH EVERY DAY BEFORE BREAKFAST, Disp: 90 tablet, Rfl: 1  Current Facility-Administered Medications:    ondansetron (ZOFRAN) injection 8 mg, 8 mg, Intravenous, Once, Joevon Holliman, Rose Phi, MD  Allergies:  Allergies  Allergen Reactions   Anesthetics, Ester Nausea And Vomiting   Erythromycin Nausea And Vomiting    Past Medical History, Surgical history, Social history, and Family History were reviewed and updated.  Review of Systems: Review of Systems  Constitutional: Negative.   HENT:  Positive for congestion.   Eyes: Negative.   Respiratory: Negative.    Cardiovascular: Negative.   Gastrointestinal: Negative.   Genitourinary: Negative.   Musculoskeletal: Negative.   Skin: Negative.   Neurological: Negative.   Endo/Heme/Allergies: Negative.   Psychiatric/Behavioral: Negative.       Physical Exam:  weight is 140 lb 12.8 oz (63.9 kg). Her oral temperature is 98.1 F (36.7 C). Her blood pressure is 113/52 (abnormal) and her pulse is 65. Her respiration is 17 and oxygen saturation is 100%.   Physical Exam Vitals reviewed.  Constitutional:      Comments: Her breast exam shows right mastectomy.  Rachel [name] has reconstruction on this now.  This is healing.  There is no erythema.  There is no warmth or swelling.  .  There is no right axillary adenopathy.  Left breast is unremarkable.  There is no mass in the left breast.  There is no left axillary adenopathy.    HENT:     Head: Normocephalic and atraumatic.  Eyes:     Pupils: Pupils are equal, round, and reactive to light.  Cardiovascular:     Rate and Rhythm: Normal rate and regular rhythm.     Heart sounds: Normal heart sounds.  Pulmonary:      Effort: Pulmonary effort is normal.     Breath sounds: Normal breath sounds.  Abdominal:     General: Bowel sounds are normal.     Palpations: Abdomen is soft.     Comments: Abdominal exam shows a well-healed reconstruction scar in the pelvic area.  Rachel [name] has a umbilical surgery scar that is healing.  There is no masses.  There is no tenderness.  There is no fluid wave.  Musculoskeletal:        General: No tenderness or deformity. Normal range of motion.     Cervical back: Normal range of motion.  Lymphadenopathy:     Cervical: No cervical adenopathy.  Skin:    General: Skin is warm and dry.     Findings: No erythema or rash.  Neurological:     Mental Status: Rachel [name] is alert and oriented to person, place, and time.  Psychiatric:        Behavior: Behavior normal.        Thought Content: Thought content normal.        Judgment: Judgment normal.    Lab Results  Component Value Date   WBC 5.2 07/14/2022   HGB 13.9 07/14/2022   HCT 42.6 07/14/2022   MCV 91.8 07/14/2022   PLT 249 07/14/2022     Chemistry      Component Value Date/Time   NA 143 05/12/2022 0839   NA 140 09/26/2016 0743   NA 143 09/21/2015 0854   K 4.7 05/12/2022 0839   K 3.8 09/26/2016 0743   K 4.4 09/21/2015 0854   CL 108 05/12/2022 0839   CL 104 09/26/2016 0743   CO2 28 05/12/2022 0839   CO2 30 09/26/2016 0743   CO2 28 09/21/2015 0854   BUN 19 05/12/2022 0839   BUN 14 09/26/2016 0743   BUN 13.9 09/21/2015 0854   CREATININE 0.78 05/12/2022 0839   CREATININE 0.87 01/01/2018 1055   CREATININE 0.8 09/21/2015 0854      Component Value Date/Time   CALCIUM 9.6 05/12/2022 0839   CALCIUM 9.4 09/26/2016 0743   CALCIUM 10.1 09/21/2015 0854   ALKPHOS 83 05/12/2022 0839   ALKPHOS 80 09/26/2016 0743   ALKPHOS 98 09/21/2015 0854   AST 18 05/12/2022 0839   AST 37 (H) 09/21/2015 0854   ALT 15 05/12/2022 0839   ALT 35 09/26/2016 0743   ALT 57 (H) 09/21/2015 0854   BILITOT 0.5 05/12/2022 0839   BILITOT 0.53  09/21/2015 0854      Impression and Plan: Rachel [name] is a 57 year old white female with a history of stage I ductal carcinoma of the right breast. Rachel [name] was diagnosed 22 years ago. Her tumor was ER positive. I  think that  Rachel [name] is cured.  Now, Rachel [name] has a second breast cancer.  Again I feel this is not a recurrence in the breast.  I feel this is a second breast cancer.  Again, Rachel [name] completed her adjuvant chemotherapy.  Rachel [name] went on to [place] to have surgery for the reconstruction.  Rachel [name] did well with this.  We will continue on the Aromasin.  I know that Rachel [name] will have a wonderful Summer.  I would like to see her back after Labor Day.  We will plan to get her back in September.   Josph Macho, MD 6/3/20248:57 AM

## 2022-07-15 ENCOUNTER — Other Ambulatory Visit: Payer: Self-pay

## 2022-07-21 ENCOUNTER — Other Ambulatory Visit: Payer: Self-pay

## 2022-07-28 ENCOUNTER — Other Ambulatory Visit: Payer: Self-pay | Admitting: Hematology & Oncology

## 2022-08-05 ENCOUNTER — Telehealth: Payer: Self-pay

## 2022-08-05 NOTE — Telephone Encounter (Signed)
Prescription Request  08/05/2022  LOV: 05/28/21  What is the name of the medication or equipment? pantoprazole (PROTONIX) 40 MG tablet  Have you contacted your pharmacy to request a refill? Yes   Which pharmacy would you like this sent to?  [id number] [place], [place] - [address] [place] [address] Phone: [phone] Fax: [phone]    Patient notified that their request is being sent to the clinical staff for review and that they should receive a response within 2 business days.   Please advise at [facility] [phone]

## 2022-08-22 IMAGING — MG MM BREAST BX W/ LOC DEV 1ST LESION IMAGE BX SPEC STEREO GUIDE*R*
7 series · 8 of 15 positions shown · non-contrast
Comparison: Previous exams.
COMPARISON: Previous exams.

Addendum:
CLINICAL DATA: 56-year-old female presenting for biopsy of
calcifications in the right breast.

EXAM:
RIGHT BREAST STEREOTACTIC CORE NEEDLE BIOPSY

[R (1 of 4)]
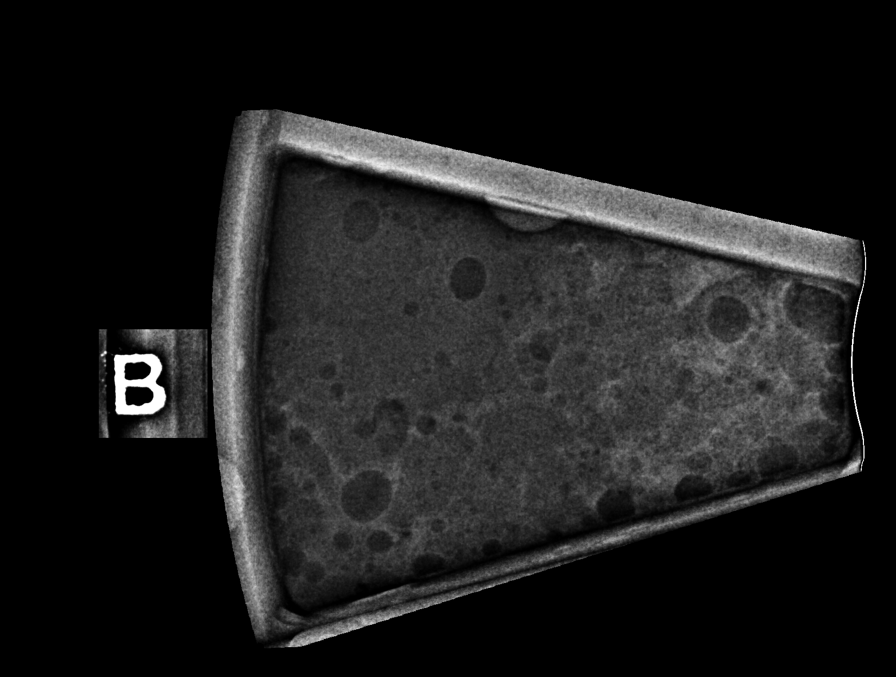

[R (2 of 4)]
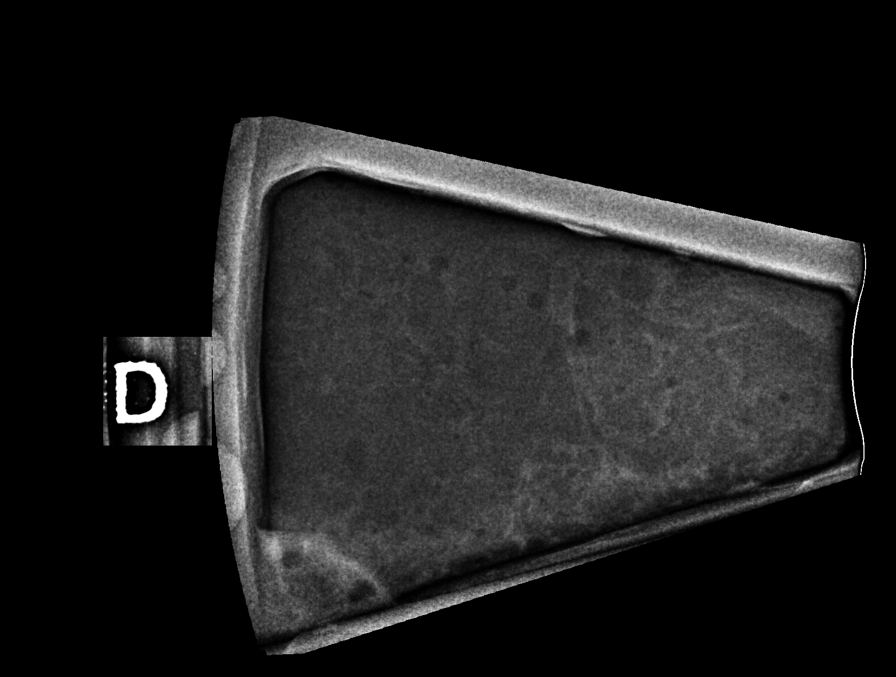

[R (3 of 4)]
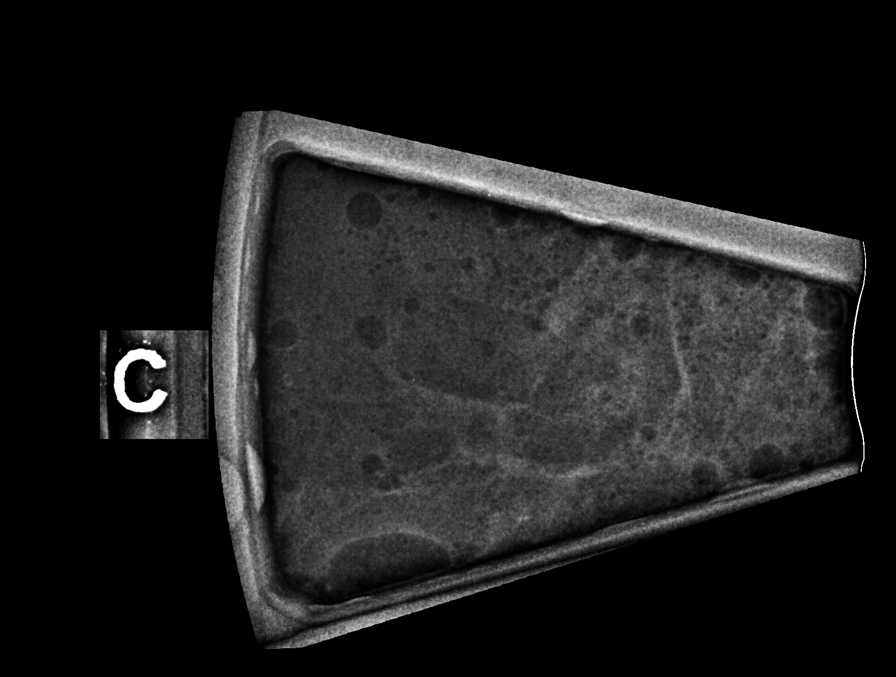

[R (4 of 4)]
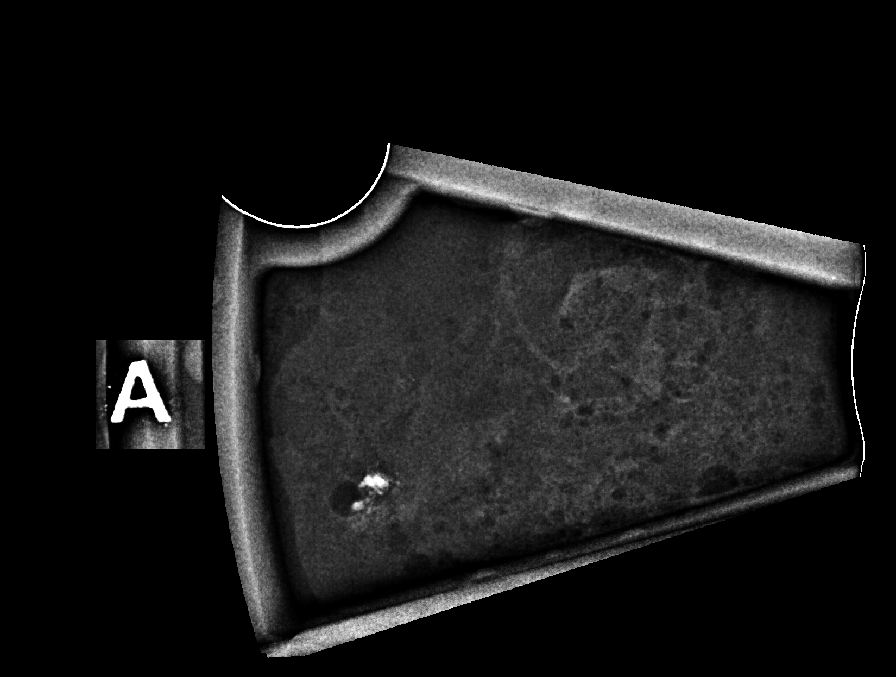

[R LM]
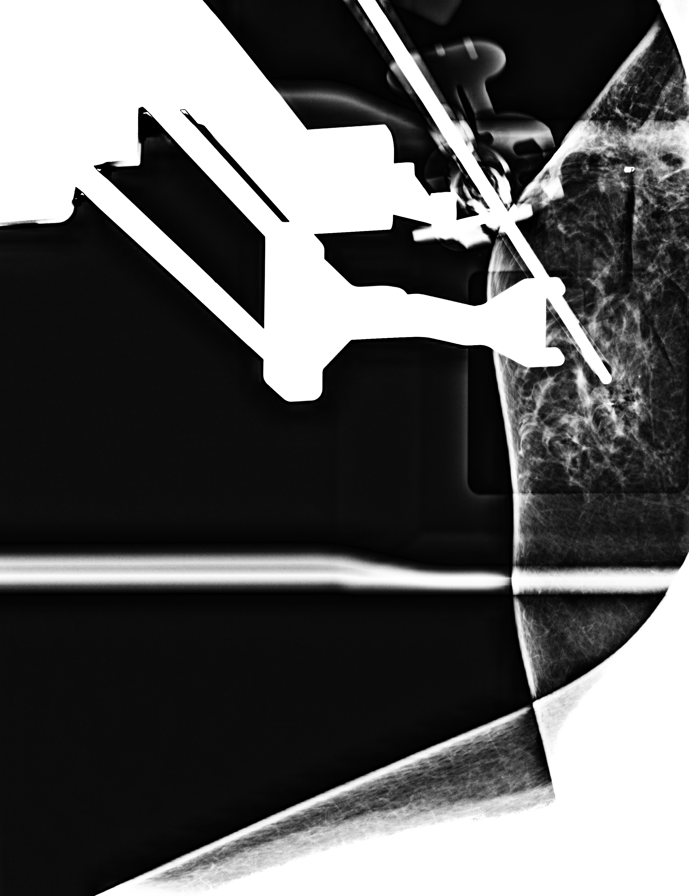

[R LM tomo · 2 of 38 frames shown (1 of 2)]
[frame 13/38]
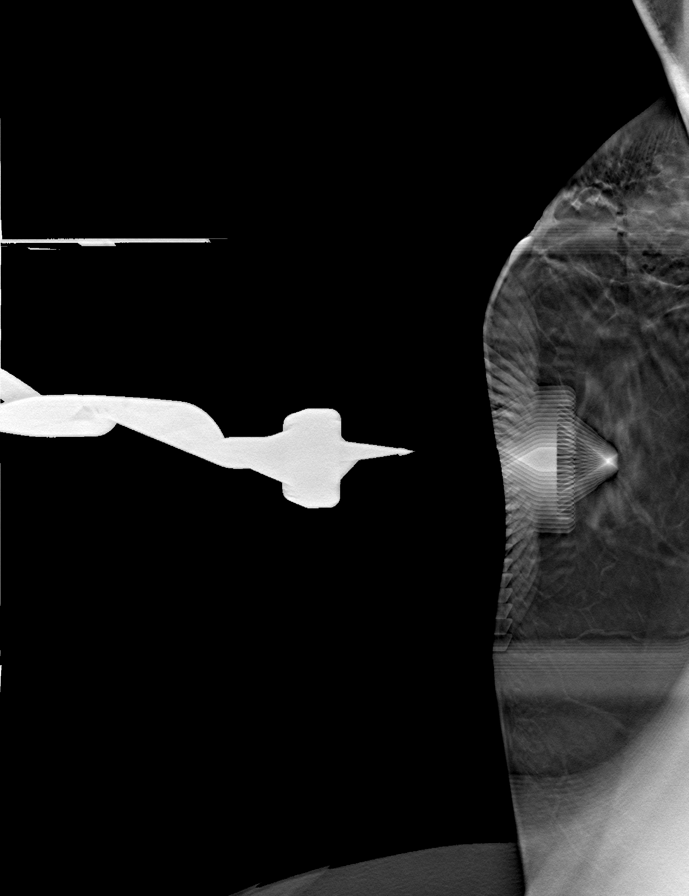
[frame 19/38]
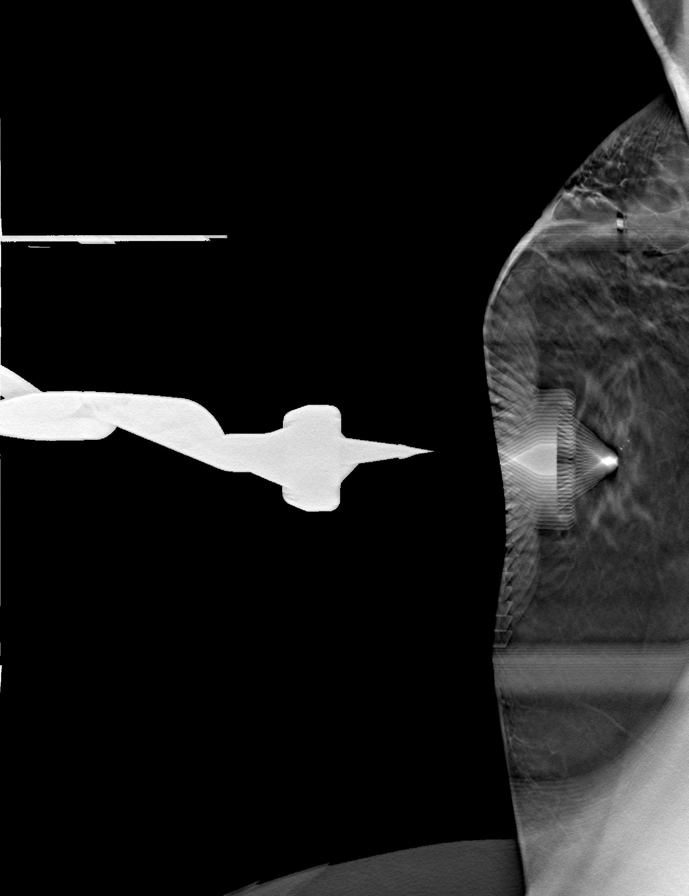

[R LM tomo (2 of 2) · tomo slice 19/38.0]
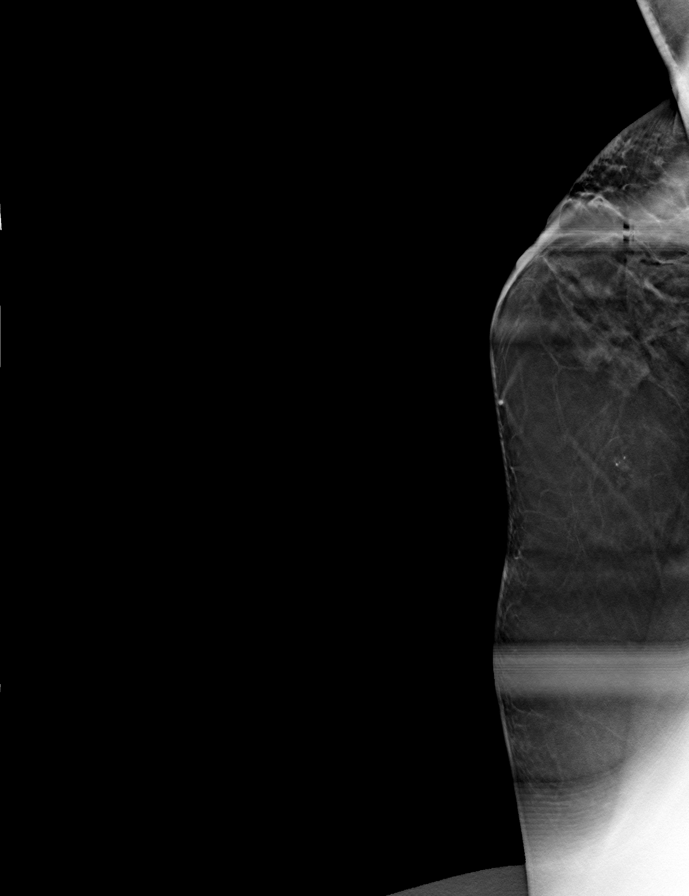

[8 of 15 positions shown; findings below may reference images not displayed]



Using sterile technique and 1% Lidocaine as local anesthetic, under
stereotactic guidance, a 9 gauge vacuum assisted device was used to
perform core needle biopsy of calcifications in the superior central
right breast using a lateral approach. Specimen radiograph was
performed showing 1 specimen with calcifications. Specimens with
calcifications are identified for pathology.

Lesion quadrant: Upper inner quadrant

At the conclusion of the procedure, an X shaped tissue marker clip
was deployed into the biopsy cavity. Follow-up 2-view mammogram was
performed and dictated separately.
IMPRESSION: Stereotactic-guided biopsy of calcifications in the superior central
right breast. No apparent complications.

ADDENDUM:
Pathology revealed GRADE II INVASIVE DUCTAL CARCINOMA, of the RIGHT
breast, 5 o'clock, 0cmfn, (ribbon clip). This was found to be
concordant by Dr. Kona Elaine.

Pathology revealed GRADE II INVASIVE DUCTAL CARCINOMA WITH A SMALL
MUCINOUS COMPONENT of the RIGHT breast, 6 o'clock, retroareolar,
(coil clip). This was found to be concordant by Dr. Reeta
Lylle.

Pathology revealed HYALINIZED FIBROADENOMA WITH DYSTROPHIC
MICROCALCIFICATIONS of the RIGHT breast, superior central, (x clip).
This was found to be concordant by Dr. Kona Elaine.

Pathology results were discussed with the patient by telephone. The
patient reported doing well after the biopsies with tenderness,
bruising and swelling at the sites. Post biopsy instructions and
care were reviewed and questions were answered. The patient was
encouraged to call The [REDACTED] for any
additional concerns. My direct phone number was provided.

Surgical consultation has been arranged with Dr. Chiriga Poling at
[REDACTED] on June 03, 2021.

Dr. Don Lolito Onacram was notified of biopsy results via [REDACTED] message on
May 20, 2021.

Pathology results reported by Roberto Beppe Bocognano, RN on 05/21/2021.



Using sterile technique and 1% Lidocaine as local anesthetic, under
stereotactic guidance, a 9 gauge vacuum assisted device was used to
perform core needle biopsy of calcifications in the superior central
right breast using a lateral approach. Specimen radiograph was
performed showing 1 specimen with calcifications. Specimens with
calcifications are identified for pathology.

Lesion quadrant: Upper inner quadrant

At the conclusion of the procedure, an X shaped tissue marker clip
was deployed into the biopsy cavity. Follow-up 2-view mammogram was
performed and dictated separately.
IMPRESSION: Stereotactic-guided biopsy of calcifications in the superior central
right breast. No apparent complications.

## 2022-08-22 IMAGING — MG MM BREAST LOCALIZATION CLIP
7 series · 9 of 15 positions shown · non-contrast
Comparison: Previous exam(s).

CLINICAL DATA: Post procedure mammogram for clip placement

EXAM:
3D DIAGNOSTIC RIGHT MAMMOGRAM POST STEREOTACTIC BIOPSY

[R CC (1 of 2)]
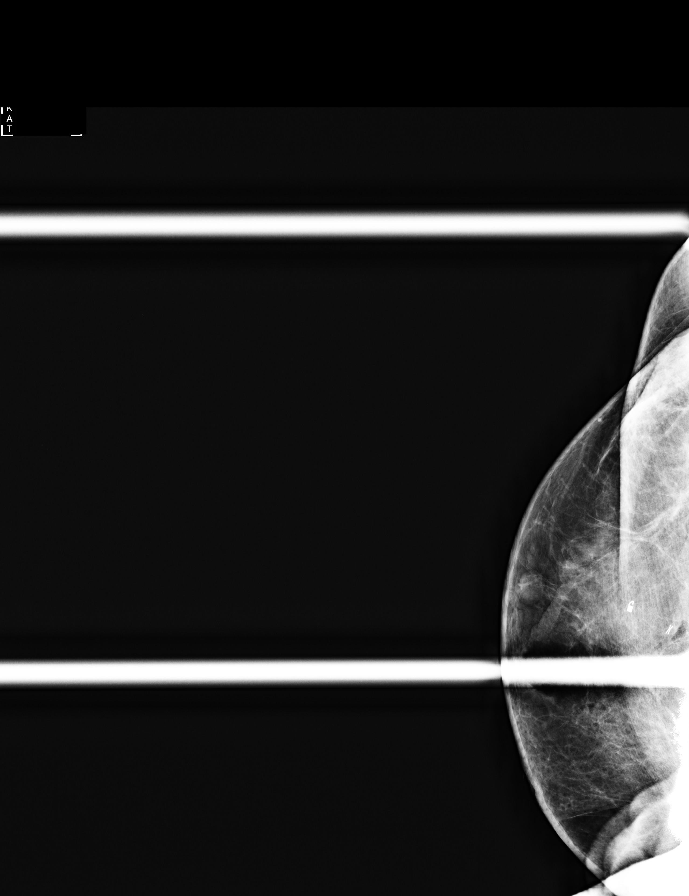

[R CC (2 of 2)]
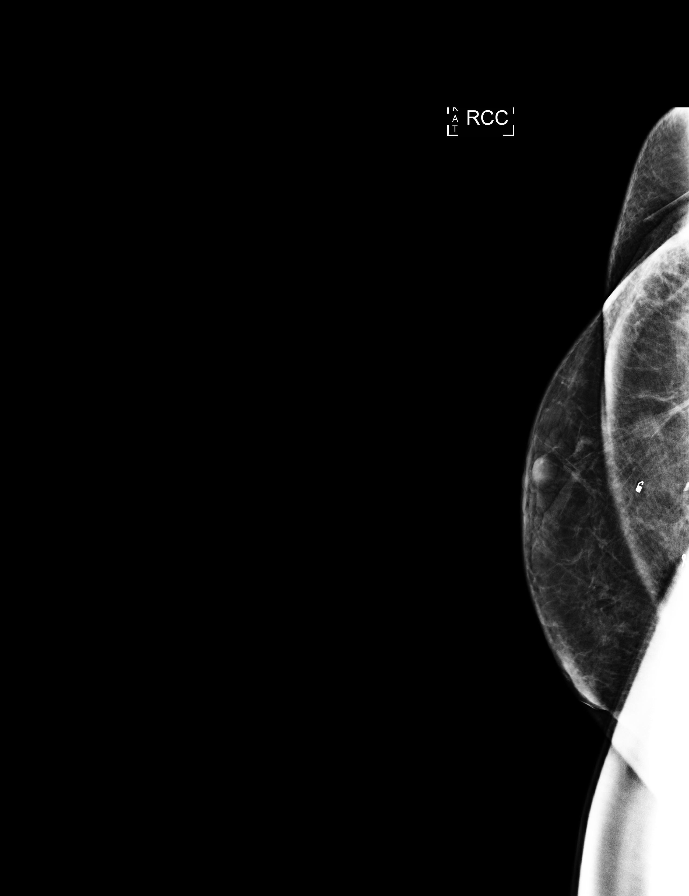

[R CC synth-2D]
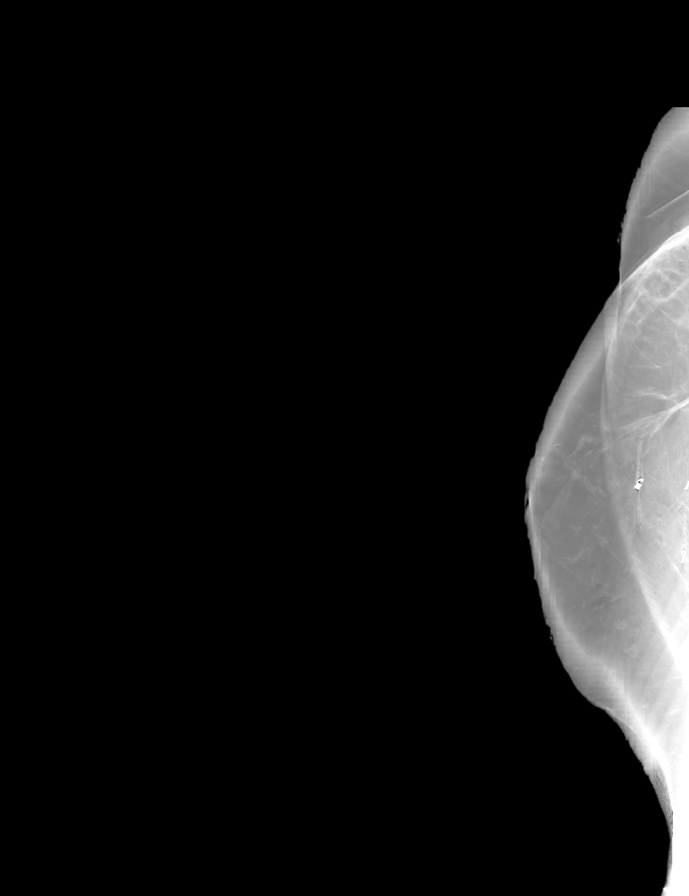

[R LM]
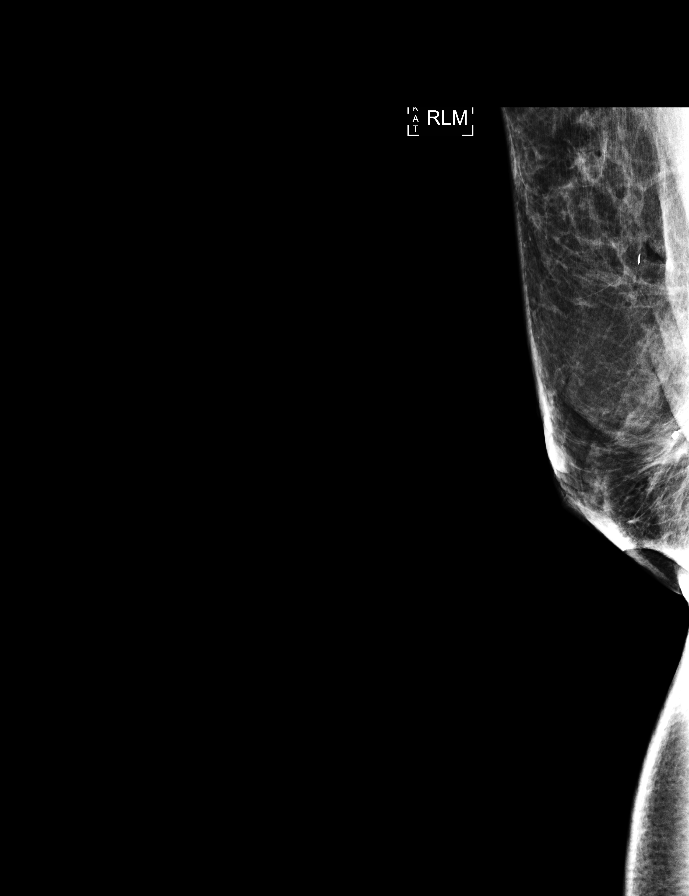

[R LM synth-2D]
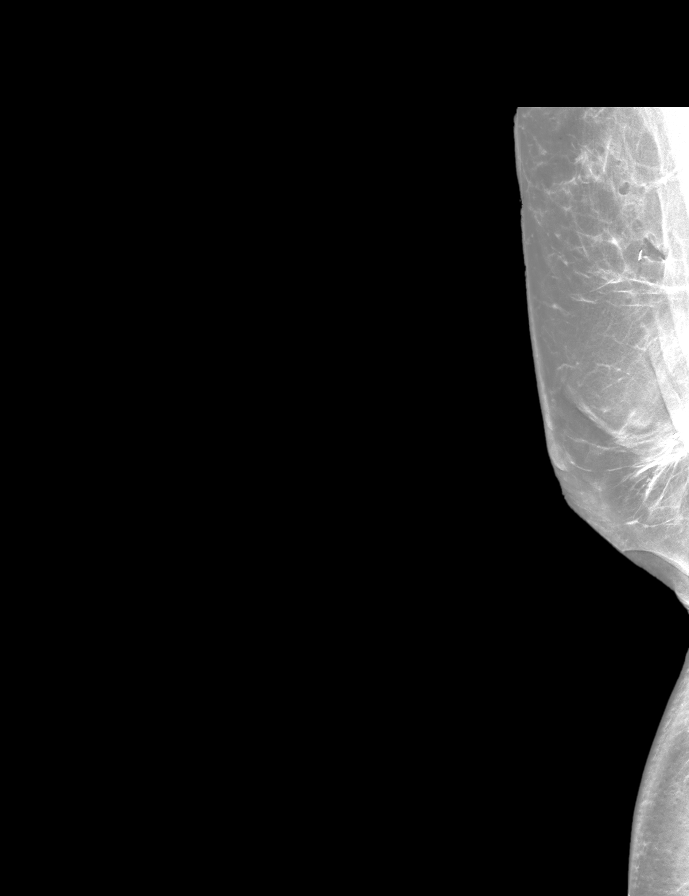

[R CC tomo · 3 of 83 frames shown]
[frame 27/83]
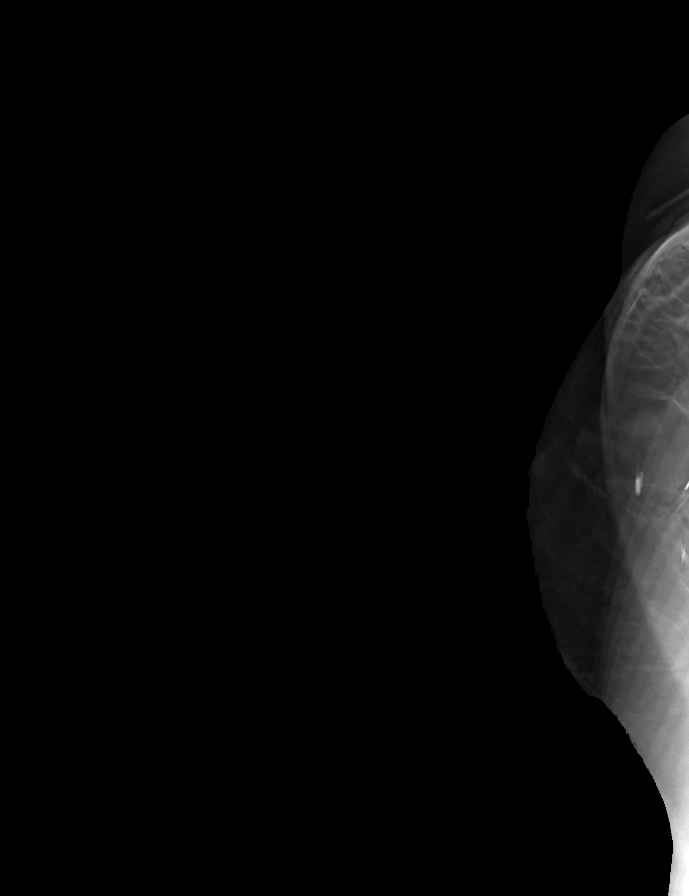
[frame 42/83]
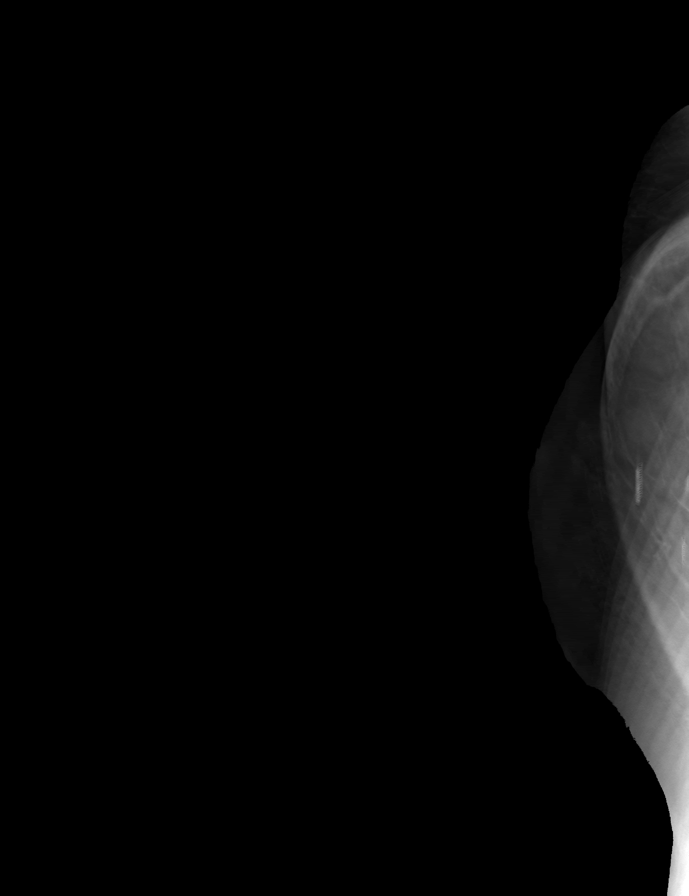
[frame 57/83]
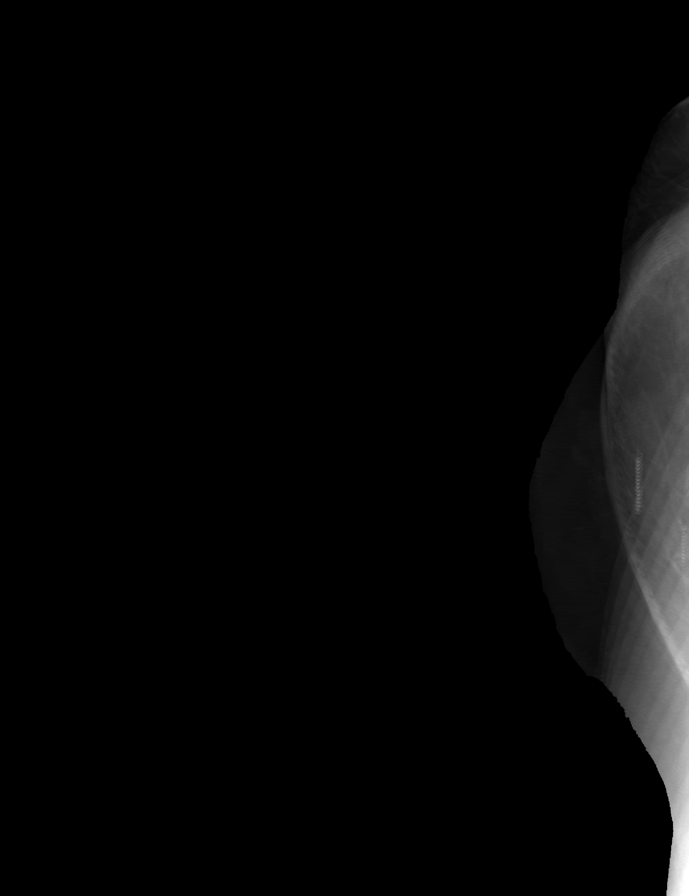

[R LM tomo · tomo slice 41/81.0]
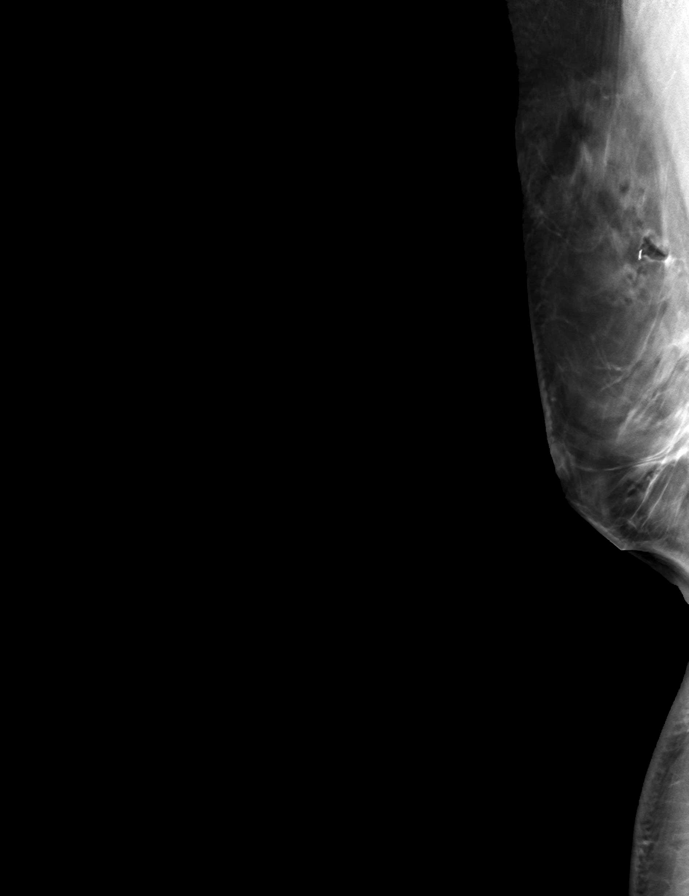

[9 of 15 positions shown; findings below may reference images not displayed]

FINDINGS: 3D Mammographic images were obtained following stereotactic guided
biopsy of calcifications in the superior central right breast. The
biopsy marking clip is in expected position at the site of biopsy.
IMPRESSION: Appropriate positioning of the X shaped biopsy marking clip at the
site of biopsy in the superior central right breast.

Final Assessment: Post Procedure Mammograms for Marker Placement

## 2022-09-17 ENCOUNTER — Encounter (HOSPITAL_BASED_OUTPATIENT_CLINIC_OR_DEPARTMENT_OTHER): Payer: Self-pay | Admitting: Obstetrics & Gynecology

## 2022-09-18 ENCOUNTER — Other Ambulatory Visit (HOSPITAL_BASED_OUTPATIENT_CLINIC_OR_DEPARTMENT_OTHER): Payer: Self-pay | Admitting: *Deleted

## 2022-09-18 MED ORDER — VEOZAH 45 MG PO TABS: 1.0000 | ORAL_TABLET | Freq: Every day | ORAL | 1 refills | Status: DC

## 2022-10-06 ENCOUNTER — Ambulatory Visit: Payer: BC Managed Care – PPO | Admitting: Family Medicine

## 2022-10-06 ENCOUNTER — Encounter: Payer: Self-pay | Admitting: Family Medicine

## 2022-10-06 VITALS — BP 118/62 | HR 58 | Temp 97.8°F | Ht 64.0 in | Wt 143.4 lb

## 2022-10-06 DIAGNOSIS — J019 Acute sinusitis, unspecified: Secondary | ICD-10-CM

## 2022-10-06 DIAGNOSIS — B9689 Other specified bacterial agents as the cause of diseases classified elsewhere: Secondary | ICD-10-CM | POA: Diagnosis not present

## 2022-10-06 DIAGNOSIS — E7800 Pure hypercholesterolemia, unspecified: Secondary | ICD-10-CM | POA: Diagnosis not present

## 2022-10-06 MED ORDER — PANTOPRAZOLE SODIUM 40 MG PO TBEC: 40.0000 mg | DELAYED_RELEASE_TABLET | Freq: Every day | ORAL | 3 refills | Status: AC

## 2022-10-06 MED ORDER — PROMETHAZINE HCL 25 MG PO TABS: 25.0000 mg | ORAL_TABLET | Freq: Three times a day (TID) | ORAL | 0 refills | Status: AC | PRN

## 2022-10-06 MED ORDER — FLUCONAZOLE 150 MG PO TABS: 150.0000 mg | ORAL_TABLET | Freq: Once | ORAL | 0 refills | Status: AC

## 2022-10-06 MED ORDER — AMOXICILLIN 875 MG PO TABS: 875.0000 mg | ORAL_TABLET | Freq: Two times a day (BID) | ORAL | 0 refills | Status: AC

## 2022-10-06 NOTE — Progress Notes (Signed)
Subjective:    Patient ID: Rachel [name], female    DOB: 1965-03-03, 57 y.o.   MRN: [id number]  Patient reports a sinus infection.  Reports pain in frontal and maxillary sinuses bilaterally.  Head congestion and rhinorrhea are present.  Symptoms have been present for weeks. Constant headache.  No photophobia, phonophobia, or vision changes.  Patient states her teeth hurt.   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  Prior T score -2.5.  DEXA stable from prior study.   Personal history of chemotherapy 2001   Personal history of radiation therapy 2001   Pneumonia    Had 12-2017   PONV (postoperative nausea and vomiting)    Past Surgical History:  Procedure Laterality Date   BREAST EXCISIONAL BIOPSY Left 2021   BREAST LUMPECTOMY Right 2001   CESAREAN SECTION  1991   COLONOSCOPY     MASTECTOMY W/ SENTINEL NODE BIOPSY Right 06/25/2021   Procedure: RIGHT MASTECTOMY WITH SENTINEL LYMPH NODE BIOPSY;  Surgeon: Almond Lint, MD;  Location: [facility];  Service: General;  Laterality: Right;   NASAL SINUS SURGERY Right 02/20/2020   Procedure: ENDOSCOPIC SINUS SURGERY WITH RIGHT ETHMOIDECTOMY AND RIGHT MAXILLARY OSTIA ENLARGEMENT WITH FUSION;  Surgeon: Drema Halon, MD;  Location: [facility];  Service: ENT;  Laterality: Right;   PELVIC LAPAROSCOPY     endometriosis   PORTACATH PLACEMENT N/A 08/07/2021   Procedure: PORT PLACEMENT WITH ULTRASOUND GUIDANCE;  Surgeon: Almond Lint, MD;  Location: [facility];  Service: General;  Laterality: N/A;    SINUS ENDO WITH FUSION Right 02/20/2020   Procedure: SINUS ENDO WITH FUSION;  Surgeon: Drema Halon, MD;  Location: [facility];  Service: ENT;  Laterality: Right;   TONSILLECTOMY  1980   UPPER GI ENDOSCOPY     WISDOM TOOTH EXTRACTION  1986   Current Outpatient Medications on File Prior to Visit  Medication Sig Dispense Refill   acetaminophen (TYLENOL) 500 MG tablet Take 1,000 mg by mouth every 6 (six) hours as needed for moderate pain or mild pain.     butalbital-acetaminophen-caffeine (FIORICET, ESGIC) 50-325-40 MG per tablet TAKE 1 TABLET BY MOUTH EVERY 6 HOURS AS NEEDED FOR HEADACHE 20 tablet 0   Cholecalciferol (VITAMIN D PO) Take 1,000 Units by mouth daily.     exemestane (AROMASIN) 25 MG tablet Take 1 tablet (25 mg total) by mouth daily after breakfast. 30 tablet 12   Fezolinetant (VEOZAH) 45 MG TABS Take 1 tablet (45 mg total) by mouth daily. 30 tablet 1   fluticasone (FLONASE) 50 MCG/ACT nasal spray INHALE 2 SPRASY IN EACH NOSTRIL ONCE DAILY 16 g 11   ibuprofen (ADVIL) 600 MG tablet Take 600 mg by mouth every 8 (eight) hours.     promethazine (PHENERGAN) 25 MG tablet TAKE 1 TABLET BY MOUTH EVERY 8 HOURS AS NEEDED FOR NAUSEA/VOMITING 20 tablet 0   rizatriptan (MAXALT) 10 MG tablet TAKE 1 TABLET BY MOUTH EVERY  DAY AS NEEDED FOR MIGRANE 6 tablet 5   UNITHROID 112 MCG tablet TAKE 1 TABLET BY MOUTH EVERY DAY BEFORE BREAKFAST 90 tablet 1   [DISCONTINUED] prochlorperazine (COMPAZINE) 10 MG tablet Take 1 tablet (10 mg total) by mouth every 6 (six) hours as needed (Nausea or vomiting). 30 tablet 1   Current Facility-Administered Medications on File Prior to Visit  Medication Dose Route Frequency Provider Last Rate Last Admin   ondansetron (ZOFRAN) injection 8 mg  8 mg Intravenous Once Ennever, Rose Phi, MD       Allergies  Allergen Reactions   Anesthetics, Ester Nausea And Vomiting   Erythromycin Nausea And Vomiting   Social History   Socioeconomic History    Marital status: Married    Spouse name: Not on file   Number of children: Not on file   Years of education: Not on file   Highest education level: Not on file  Occupational History   Not on file  Tobacco Use   Smoking status: Never   Smokeless tobacco: Never   Tobacco comments:    never used tobacco  Vaping Use   Vaping status: Never Used  Substance and Sexual Activity   Alcohol use: Not Currently    Alcohol/week: 0.0 standard drinks of alcohol   Drug use: No   Sexual activity: Yes    Birth control/protection: Post-menopausal    Comment: 1st intercourse 57 yo-Fewer than 5 partners  Other Topics Concern   Not on file  Social History Narrative   Not on file   Social Determinants of Health   Financial Resource Strain: Not on file  Food Insecurity: Not on file  Transportation Needs: Not on file  Physical Activity: Not on file  Stress: Not on file  Social Connections: Not on file  Intimate Partner Violence: Not on file      Review of Systems  All other systems reviewed and are negative.      Objective:   Physical Exam Vitals reviewed.  Constitutional:      General: She is not in acute distress.    Appearance: Normal appearance. She is normal weight. She is not ill-appearing, toxic-appearing or diaphoretic.  HENT:     Head: Normocephalic and atraumatic.     Right Ear: Tympanic membrane, ear canal and external ear normal. There is no impacted cerumen.     Left Ear: Tympanic membrane, ear canal and external ear normal. There is no impacted cerumen.     Nose: Congestion and rhinorrhea present.     Right Sinus: Maxillary sinus tenderness present. No frontal sinus tenderness.     Left Sinus: Maxillary sinus tenderness present. No frontal sinus tenderness.  Eyes:     Conjunctiva/sclera: Conjunctivae normal.  Cardiovascular:     Rate and Rhythm: Normal rate and regular rhythm.     Pulses: Normal pulses.     Heart sounds: Normal heart sounds. No murmur heard.    No  friction rub. No gallop.  Pulmonary:     Effort: Pulmonary effort is normal. No respiratory distress.     Breath sounds: Normal breath sounds. No stridor. No wheezing, rhonchi or rales.  Chest:     Chest wall: No tenderness.  Musculoskeletal:     Cervical back: Normal range of motion.  Lymphadenopathy:     Cervical: No cervical adenopathy.  Neurological:     Mental Status: She is alert.           Assessment & Plan:  Pure hypercholesterolemia - Plan:  CT CARDIAC SCORING (SELF PAY ONLY)  Acute bacterial rhinosinusitis Amoxicillin 875 mg pobid for 10 days.  Schedule Cardiac CT to assess coronary artery calcium score in order to risk stratify the patient given her severe HLD and she declines statin at present.

## 2022-10-10 ENCOUNTER — Ambulatory Visit (HOSPITAL_COMMUNITY)
Admission: RE | Admit: 2022-10-10 | Discharge: 2022-10-10 | Disposition: A | Discharge status: Home / Self Care | Payer: BC Managed Care – PPO | Source: Ambulatory Visit | Attending: Family Medicine | Admitting: Family Medicine

## 2022-10-10 DIAGNOSIS — E7800 Pure hypercholesterolemia, unspecified: Secondary | ICD-10-CM | POA: Insufficient documentation

## 2022-10-17 ENCOUNTER — Other Ambulatory Visit: Payer: Self-pay

## 2022-10-17 ENCOUNTER — Encounter (HOSPITAL_BASED_OUTPATIENT_CLINIC_OR_DEPARTMENT_OTHER): Payer: Self-pay | Admitting: Orthopedic Surgery

## 2022-10-18 NOTE — H&P (Signed)
Preoperative History & Physical Exam  Surgeon: Philipp Ovens, MD  Diagnosis: Closed fracture of distal end of left radius  Planned Procedure: Procedure(s) (LRB): OPEN REDUCTION INTERNAL FIXATION (ORIF) WRIST FRACTURE (Left)  History of Present Illness:   Patient is a 57 y.o. female with symptoms consistent with Closed fracture of distal end of left radius who presents for surgical intervention. The risks, benefits and alternatives of surgical intervention were discussed and informed consent was obtained prior to surgery.  Past Medical History:  Past Medical History:  Diagnosis Date   ASCUS of cervix with negative high risk HPV 03/2018   Bone pain due to G-CSF 08/23/2021   Cancer (HCC) 2001   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  Prior T score -2.5.  DEXA stable from prior study.   Personal history of chemotherapy 2001   Personal history of radiation therapy 2001   Pneumonia    Had 12-2017   PONV (postoperative nausea and vomiting)     Past Surgical History:  Past Surgical History:  Procedure Laterality Date   BREAST EXCISIONAL BIOPSY Left 2021   BREAST LUMPECTOMY Right 2001   CESAREAN SECTION  1991   COLONOSCOPY     MASTECTOMY W/ SENTINEL NODE BIOPSY Right 06/25/2021   Procedure: RIGHT MASTECTOMY WITH SENTINEL LYMPH NODE BIOPSY;  Surgeon: Almond Lint, MD;  Location: [facility];  Service: General;  Laterality: Right;   NASAL SINUS SURGERY Right 02/20/2020   Procedure: ENDOSCOPIC SINUS SURGERY WITH RIGHT ETHMOIDECTOMY AND RIGHT MAXILLARY OSTIA ENLARGEMENT WITH FUSION;  Surgeon: Drema Halon, MD;  Location: [facility];  Service: ENT;  Laterality: Right;    PELVIC LAPAROSCOPY     endometriosis   PORTACATH PLACEMENT N/A 08/07/2021   Procedure: PORT PLACEMENT WITH ULTRASOUND GUIDANCE;  Surgeon: Almond Lint, MD;  Location: [facility];  Service: General;  Laterality: N/A;   SINUS ENDO WITH FUSION Right 02/20/2020   Procedure: SINUS ENDO WITH FUSION;  Surgeon: Drema Halon, MD;  Location: [facility];  Service: ENT;  Laterality: Right;   TONSILLECTOMY  1980   UPPER GI ENDOSCOPY     WISDOM TOOTH EXTRACTION  1986    Medications:  Prior to Admission medications   Medication Sig Start Date End Date Taking? Authorizing Provider  exemestane (AROMASIN) 25 MG tablet Take 1 tablet (25 mg total) by mouth daily after breakfast. 05/12/22  Yes Ennever, Rose Phi, MD  Fezolinetant (VEOZAH) 45 MG TABS Take 1 tablet (45 mg total) by mouth daily. 09/18/22  Yes Jerene Bears, MD  UNITHROID 112 MCG tablet TAKE 1 TABLET BY MOUTH EVERY DAY BEFORE BREAKFAST 07/28/22  Yes Josph Macho, MD  acetaminophen (TYLENOL) 500 MG tablet Take 1,000 mg by mouth every 6 (six) hours as needed for moderate pain or mild pain.    [provider]  butalbital-acetaminophen-caffeine (FIORICET, ESGIC) 50-325-40 MG per tablet TAKE 1 TABLET BY MOUTH EVERY 6 HOURS AS NEEDED FOR HEADACHE 05/19/14   Donita Brooks, MD  Cholecalciferol (VITAMIN D PO) Take 1,000 Units by mouth daily.    [provider]  fluticasone (FLONASE) 50 MCG/ACT nasal spray  INHALE 2 SPRASY IN EACH NOSTRIL ONCE DAILY    Pickard, Priscille Heidelberg, MD  ibuprofen (ADVIL) 600 MG tablet Take 600 mg by mouth every 8 (eight) hours.    [provider]  pantoprazole (PROTONIX) 40 MG tablet Take 1 tablet (40 mg total) by mouth daily. 10/06/22   Donita Brooks, MD  promethazine (PHENERGAN) 25 MG tablet TAKE 1 TABLET BY MOUTH EVERY 8 HOURS AS NEEDED FOR NAUSEA/VOMITING 04/02/20   Donita Brooks, MD  promethazine (PHENERGAN) 25 MG tablet Take 1 tablet (25 mg total) by mouth every 8 (eight) hours as  needed for nausea or vomiting. 10/06/22   Donita Brooks, MD  rizatriptan (MAXALT) 10 MG tablet TAKE 1 TABLET BY MOUTH EVERY DAY AS NEEDED FOR MIGRANE 10/12/18   Donita Brooks, MD  prochlorperazine (COMPAZINE) 10 MG tablet Take 1 tablet (10 mg total) by mouth every 6 (six) hours as needed (Nausea or vomiting). 08/06/21 10/24/21  Josph Macho, MD    Allergies:  Anesthetics, ester and Erythromycin  Review of Systems: Negative except per HPI.  Physical Exam: Alert and oriented, NAD Head and neck: no masses, normal alignment CV: pulse intact Pulm: no increased work of breathing, respirations even and unlabored Abdomen: non-distended Extremities: extremities warm and well perfused  LABS: No results found for this or any previous visit (from the past 2160 hour(s)).   Complete History and Physical exam available in the office notes  Nolberto Hanlon Sariah Henkin

## 2022-10-19 ENCOUNTER — Other Ambulatory Visit: Payer: Self-pay

## 2022-10-21 ENCOUNTER — Ambulatory Visit (HOSPITAL_BASED_OUTPATIENT_CLINIC_OR_DEPARTMENT_OTHER): Payer: BC Managed Care – PPO | Admitting: Certified Registered Nurse Anesthetist

## 2022-10-21 ENCOUNTER — Other Ambulatory Visit: Payer: Self-pay

## 2022-10-21 ENCOUNTER — Ambulatory Visit (HOSPITAL_BASED_OUTPATIENT_CLINIC_OR_DEPARTMENT_OTHER)
Admission: RE | Admit: 2022-10-21 | Discharge: 2022-10-21 | Disposition: A | Discharge status: Home / Self Care | Payer: BC Managed Care – PPO | Source: Ambulatory Visit | Attending: Orthopedic Surgery | Admitting: Orthopedic Surgery

## 2022-10-21 ENCOUNTER — Encounter (HOSPITAL_BASED_OUTPATIENT_CLINIC_OR_DEPARTMENT_OTHER)
Admission: RE | Disposition: A | Discharge status: Home / Self Care | Payer: Self-pay | Source: Ambulatory Visit | Attending: Orthopedic Surgery

## 2022-10-21 ENCOUNTER — Ambulatory Visit (HOSPITAL_BASED_OUTPATIENT_CLINIC_OR_DEPARTMENT_OTHER): Payer: Self-pay | Admitting: Certified Registered Nurse Anesthetist

## 2022-10-21 ENCOUNTER — Inpatient Hospital Stay: Payer: BC Managed Care – PPO

## 2022-10-21 ENCOUNTER — Ambulatory Visit (HOSPITAL_BASED_OUTPATIENT_CLINIC_OR_DEPARTMENT_OTHER): Payer: BC Managed Care – PPO

## 2022-10-21 ENCOUNTER — Encounter (HOSPITAL_BASED_OUTPATIENT_CLINIC_OR_DEPARTMENT_OTHER): Payer: Self-pay | Admitting: Orthopedic Surgery

## 2022-10-21 ENCOUNTER — Ambulatory Visit: Payer: BC Managed Care – PPO | Admitting: Hematology & Oncology

## 2022-10-21 DIAGNOSIS — S62102A Fracture of unspecified carpal bone, left wrist, initial encounter for closed fracture: Secondary | ICD-10-CM

## 2022-10-21 DIAGNOSIS — X58XXXA Exposure to other specified factors, initial encounter: Secondary | ICD-10-CM | POA: Insufficient documentation

## 2022-10-21 DIAGNOSIS — Y939 Activity, unspecified: Secondary | ICD-10-CM | POA: Insufficient documentation

## 2022-10-21 DIAGNOSIS — S52572A Other intraarticular fracture of lower end of left radius, initial encounter for closed fracture: Secondary | ICD-10-CM | POA: Diagnosis not present

## 2022-10-21 DIAGNOSIS — S52571A Other intraarticular fracture of lower end of right radius, initial encounter for closed fracture: Secondary | ICD-10-CM | POA: Diagnosis present

## 2022-10-21 HISTORY — PX: OPEN REDUCTION INTERNAL FIXATION (ORIF) DISTAL RADIAL FRACTURE: SHX5989

## 2022-10-21 SURGERY — OPEN REDUCTION INTERNAL FIXATION (ORIF) DISTAL RADIUS FRACTURE
Anesthesia: Monitor Anesthesia Care | Site: Wrist | Laterality: Left

## 2022-10-21 MED ORDER — ONDANSETRON HCL 4 MG/2ML IJ SOLN: 4.0000 mg | Freq: Once | INTRAMUSCULAR | Status: DC | PRN

## 2022-10-21 MED ORDER — FENTANYL CITRATE (PF) 100 MCG/2ML IJ SOLN
INTRAMUSCULAR | Status: DC | PRN
  Administered 2022-10-21: 50 ug via INTRAVENOUS

## 2022-10-21 MED ORDER — BUPIVACAINE HCL (PF) 0.5 % IJ SOLN
INTRAMUSCULAR | Status: AC
  Filled 2022-10-21: qty 30

## 2022-10-21 MED ORDER — ONDANSETRON HCL 4 MG/2ML IJ SOLN
INTRAMUSCULAR | Status: AC
  Filled 2022-10-21: qty 2

## 2022-10-21 MED ORDER — PROPOFOL 10 MG/ML IV BOLUS
INTRAVENOUS | Status: DC | PRN
  Administered 2022-10-21: 20 mg via INTRAVENOUS

## 2022-10-21 MED ORDER — KETOROLAC TROMETHAMINE 30 MG/ML IJ SOLN
INTRAMUSCULAR | Status: DC | PRN
  Administered 2022-10-21: 30 mg via INTRAVENOUS

## 2022-10-21 MED ORDER — 0.9 % SODIUM CHLORIDE (POUR BTL) OPTIME
TOPICAL | Status: DC | PRN
  Administered 2022-10-21: 200 mL

## 2022-10-21 MED ORDER — FENTANYL CITRATE (PF) 100 MCG/2ML IJ SOLN
INTRAMUSCULAR | Status: AC
  Filled 2022-10-21: qty 2

## 2022-10-21 MED ORDER — ONDANSETRON HCL 4 MG/2ML IJ SOLN
INTRAMUSCULAR | Status: DC | PRN
  Administered 2022-10-21: 4 mg via INTRAVENOUS

## 2022-10-21 MED ORDER — MIDAZOLAM HCL 5 MG/5ML IJ SOLN
INTRAMUSCULAR | Status: DC | PRN
  Administered 2022-10-21: 1 mg via INTRAVENOUS

## 2022-10-21 MED ORDER — PROPOFOL 500 MG/50ML IV EMUL
INTRAVENOUS | Status: DC | PRN
  Administered 2022-10-21: 100 ug/kg/min via INTRAVENOUS

## 2022-10-21 MED ORDER — BACITRACIN ZINC 500 UNIT/GM EX OINT
TOPICAL_OINTMENT | CUTANEOUS | Status: AC
  Filled 2022-10-21: qty 1.8

## 2022-10-21 MED ORDER — OXYCODONE HCL 5 MG PO TABS
ORAL_TABLET | ORAL | Status: AC
  Filled 2022-10-21: qty 1

## 2022-10-21 MED ORDER — LIDOCAINE-EPINEPHRINE (PF) 1.5 %-1:200000 IJ SOLN
INTRAMUSCULAR | Status: DC | PRN
  Administered 2022-10-21: 10 mL via PERINEURAL

## 2022-10-21 MED ORDER — EPHEDRINE SULFATE (PRESSORS) 50 MG/ML IJ SOLN
INTRAMUSCULAR | Status: DC | PRN
  Administered 2022-10-21: 10 mg via INTRAVENOUS

## 2022-10-21 MED ORDER — MIDAZOLAM HCL 2 MG/2ML IJ SOLN
INTRAMUSCULAR | Status: AC
  Filled 2022-10-21: qty 2

## 2022-10-21 MED ORDER — FENTANYL CITRATE (PF) 100 MCG/2ML IJ SOLN
100.0000 ug | Freq: Once | INTRAMUSCULAR | Status: AC
  Administered 2022-10-21: 100 ug via INTRAVENOUS

## 2022-10-21 MED ORDER — FENTANYL CITRATE (PF) 100 MCG/2ML IJ SOLN
25.0000 ug | INTRAMUSCULAR | Status: DC | PRN
  Administered 2022-10-21: 50 ug via INTRAVENOUS

## 2022-10-21 MED ORDER — KETOROLAC TROMETHAMINE 30 MG/ML IJ SOLN: 30.0000 mg | Freq: Once | INTRAMUSCULAR | Status: DC | PRN

## 2022-10-21 MED ORDER — LACTATED RINGERS IV SOLN: INTRAVENOUS | Status: DC

## 2022-10-21 MED ORDER — LIDOCAINE 2% (20 MG/ML) 5 ML SYRINGE
INTRAMUSCULAR | Status: AC
  Filled 2022-10-21: qty 5

## 2022-10-21 MED ORDER — LIDOCAINE HCL (PF) 1 % IJ SOLN
INTRAMUSCULAR | Status: AC
  Filled 2022-10-21: qty 30

## 2022-10-21 MED ORDER — BACITRACIN ZINC 500 UNIT/GM EX OINT
TOPICAL_OINTMENT | CUTANEOUS | Status: AC
  Filled 2022-10-21: qty 28.35

## 2022-10-21 MED ORDER — CEFAZOLIN SODIUM-DEXTROSE 2-4 GM/100ML-% IV SOLN
2.0000 g | INTRAVENOUS | Status: AC
  Administered 2022-10-21: 2 g via INTRAVENOUS

## 2022-10-21 MED ORDER — CEFAZOLIN SODIUM-DEXTROSE 2-4 GM/100ML-% IV SOLN
INTRAVENOUS | Status: AC
  Filled 2022-10-21: qty 100

## 2022-10-21 MED ORDER — BUPIVACAINE HCL (PF) 0.5 % IJ SOLN
INTRAMUSCULAR | Status: DC | PRN
  Administered 2022-10-21: 20 mL via PERINEURAL

## 2022-10-21 MED ORDER — BACITRACIN ZINC 500 UNIT/GM EX OINT
TOPICAL_OINTMENT | CUTANEOUS | Status: DC | PRN
  Administered 2022-10-21: 1 via TOPICAL

## 2022-10-21 MED ORDER — PROPOFOL 10 MG/ML IV BOLUS
INTRAVENOUS | Status: AC
  Filled 2022-10-21: qty 20

## 2022-10-21 MED ORDER — MIDAZOLAM HCL 2 MG/2ML IJ SOLN
2.0000 mg | Freq: Once | INTRAMUSCULAR | Status: AC
  Administered 2022-10-21: 2 mg via INTRAVENOUS

## 2022-10-21 MED ORDER — OXYCODONE HCL 5 MG PO TABS
5.0000 mg | ORAL_TABLET | Freq: Once | ORAL | Status: AC | PRN
  Administered 2022-10-21: 5 mg via ORAL

## 2022-10-21 MED ORDER — OXYCODONE HCL 5 MG PO TABS: 5.0000 mg | ORAL_TABLET | Freq: Four times a day (QID) | ORAL | 0 refills | Status: DC | PRN

## 2022-10-21 MED ORDER — OXYCODONE HCL 5 MG/5ML PO SOLN: 5.0000 mg | Freq: Once | ORAL | Status: AC | PRN

## 2022-10-21 SURGICAL SUPPLY — 49 items
BIT DRILL 2.2 SS TIBIAL (BIT) IMPLANT
BLADE SURG 15 STRL LF DISP TIS (BLADE) ×1 IMPLANT
BLADE SURG 15 STRL SS (BLADE) ×1
BNDG CMPR 5X4 KNIT ELC UNQ LF (GAUZE/BANDAGES/DRESSINGS) ×1
BNDG CMPR 9X4 STRL LF SNTH (GAUZE/BANDAGES/DRESSINGS) ×1
BNDG ELASTIC 4INX 5YD STR LF (GAUZE/BANDAGES/DRESSINGS) ×1 IMPLANT
BNDG ESMARK 4X9 LF (GAUZE/BANDAGES/DRESSINGS) ×1 IMPLANT
CORD BIPOLAR FORCEPS 12FT (ELECTRODE) ×1 IMPLANT
COVER BACK TABLE 60X90IN (DRAPES) ×1 IMPLANT
CUFF TOURN SGL QUICK 18X4 (TOURNIQUET CUFF) ×1 IMPLANT
DRAPE EXTREMITY T 121X128X90 (DISPOSABLE) ×1 IMPLANT
DRAPE OEC MINIVIEW 54X84 (DRAPES) ×1 IMPLANT
DRSG EMULSION OIL 3X3 NADH (GAUZE/BANDAGES/DRESSINGS) ×1 IMPLANT
GAUZE SPONGE 4X4 12PLY STRL (GAUZE/BANDAGES/DRESSINGS) ×1 IMPLANT
GLOVE BIO SURGEON STRL SZ 6 (GLOVE) ×1 IMPLANT
GLOVE BIO SURGEON STRL SZ7.5 (GLOVE) ×1 IMPLANT
GLOVE BIOGEL PI IND STRL 6.5 (GLOVE) ×1 IMPLANT
GLOVE BIOGEL PI IND STRL 7.5 (GLOVE) ×1 IMPLANT
GLOVE SURG SS PI 6.5 STRL IVOR (GLOVE) IMPLANT
GOWN STRL REUS W/ TWL LRG LVL3 (GOWN DISPOSABLE) ×1 IMPLANT
GOWN STRL REUS W/TWL LRG LVL3 (GOWN DISPOSABLE) ×2
GOWN STRL REUS W/TWL XL LVL3 (GOWN DISPOSABLE) ×1 IMPLANT
HIBICLENS CHG 4% 4OZ BTL (MISCELLANEOUS) ×1 IMPLANT
NDL HYPO 22X1.5 SAFETY MO (MISCELLANEOUS) IMPLANT
NEEDLE HYPO 22X1.5 SAFETY MO (MISCELLANEOUS)
NS IRRIG 1000ML POUR BTL (IV SOLUTION) ×1 IMPLANT
PACK BASIN DAY SURGERY FS (CUSTOM PROCEDURE TRAY) ×1 IMPLANT
PADDING CAST ABS COTTON 4X4 ST (CAST SUPPLIES) ×1 IMPLANT
PADDING CAST SYNTHETIC 4X4 STR (CAST SUPPLIES) ×1 IMPLANT
PEG LOCKING SMOOTH 2.2X16 (Screw) IMPLANT
PEG LOCKING SMOOTH 2.2X18 (Peg) IMPLANT
PLATE NARROW DVR LEFT (Plate) IMPLANT
SCREW BN 14X2.7XNONLOCK 3 LD (Screw) IMPLANT
SCREW LOCKING 2.7X13MM (Screw) IMPLANT
SCREW LP NL 2.7X13MM (Screw) IMPLANT
SCREW NLOCK 2.7X14 (Screw) ×1 IMPLANT
SHEET MEDIUM DRAPE 40X70 STRL (DRAPES) ×1 IMPLANT
SLING ARM FOAM STRAP MED (SOFTGOODS) IMPLANT
SPLINT FIBERGLASS 3X35 (CAST SUPPLIES) ×1 IMPLANT
SUCTION TUBE FRAZIER 12FR DISP (SUCTIONS) IMPLANT
SUT ETHILON 4 0 PS 2 18 (SUTURE) IMPLANT
SUT VIC AB 0 SH 27 (SUTURE) IMPLANT
SUT VICRYL RAPIDE 4/0 PS 2 (SUTURE) IMPLANT
SYR 10ML LL (SYRINGE) IMPLANT
SYR BULB EAR ULCER 3OZ GRN STR (SYRINGE) ×1 IMPLANT
TOWEL GREEN STERILE FF (TOWEL DISPOSABLE) ×2 IMPLANT
TRAY DSU PREP LF (CUSTOM PROCEDURE TRAY) ×1 IMPLANT
TUBE CONNECTING 20X1/4 (TUBING) IMPLANT
UNDERPAD 30X36 HEAVY ABSORB (UNDERPADS AND DIAPERS) ×1 IMPLANT

## 2022-10-21 NOTE — Op Note (Signed)
OPERATIVE NOTE  DATE OF PROCEDURE: 10/21/2022  SURGEONS: Primary: Gomez Cleverly, MD  ASSISTANT: Payton Mccallum, PA-C  Due to the complexity of the surgery an assistant was necessary to aid in retraction, exposure, limb positioning, closure and dressing application. The use of an assistant on this case follows CMS and CPT guidelines, which allows an assistant to be used because of the complexity level of this case.   PREOPERATIVE DIAGNOSIS: Closed fracture of distal end of left radius  POSTOPERATIVE DIAGNOSIS: Same  NAME OF PROCEDURE:    Left distal radius open reduction internal fixation, intra- articular, 3 fragments 2.    Left wrist brachioradialis tenotomy  3.    Left wrist radiographs four views with intraoperative interpretation  ANESTHESIA: Regional Block + MAC  SKIN PREPARATION: Hibiclens  ESTIMATED BLOOD LOSS: Minimal  IMPLANTS: Biomet DVR Crosslock volar plate and screws  Implant Name Type Inv. Item Serial No. Manufacturer Lot No. LRB No. Used Action  PLATE NARROW DVR LEFT - [id number] Plate PLATE NARROW DVR LEFT  ZIMMER RECON(ORTH,TRAU,BIO,SG) ON STERILE SET Left 1 Implanted  PEG LOCKING SMOOTH 2.2X16 - [id number] Screw PEG LOCKING SMOOTH 2.2X16  ZIMMER RECON(ORTH,TRAU,BIO,SG) ON STERILE SET Left 3 Implanted  PEG LOCKING SMOOTH 2.2X18 - [id number] Peg PEG LOCKING SMOOTH 2.2X18  ZIMMER RECON(ORTH,TRAU,BIO,SG) ON STERILE SET Left 3 Implanted  SCREW LP NL 2.7X13MM - [id number] Screw SCREW LP NL 2.7X13MM  ZIMMER RECON(ORTH,TRAU,BIO,SG) ON STERILE SET Left 2 Implanted  SCREW NLOCK 2.7X14 - [id number] Screw SCREW NLOCK 2.7X14  ZIMMER RECON(ORTH,TRAU,BIO,SG) ON STERILE SET Left 1 Implanted  SCREW LOCKING 2.7X13MM - [id number] Screw SCREW LOCKING 2.7X13MM  ZIMMER RECON(ORTH,TRAU,BIO,SG) ON STERILE SET Left 2 Implanted   INDICATIONS:  Rachel [name] is a 57 y.o. female who has the above preoperative diagnosis. The patient has decided to proceed with surgical intervention.  Risks, benefits  and alternatives of operative management were discussed including, but not limited to, risks of anesthesia complications, infection, pain, persistent symptoms, stiffness, need for future surgery.  The patient understands, agrees and elects to proceed with surgery.    DESCRIPTION OF PROCEDURE: The patient was met in the pre-operative area and their identity was verified.  The operative location and laterality was also verified and marked.  The patient was brought to the OR and was placed supine on the table.  After repeat patient identification with the operative team anesthesia was provided and the patient was prepped and draped in the usual sterile fashion.  A final timeout was performed verifying the correction patient, procedure, location and laterality.  Preoperative antibiotics were administered. The left upper extremity was exsanguinated with an Esmarch and tourniquet inflated to . Under loupe magnification, an incision was made directly over the flexor carpi radialis (FCR) tendon. Bipolar was utilized for hemostasis. The roof of the FCR tendon sheath was incised. The FCR tendon was then retracted ulnarly to protect the palmar cutaneous branch of the median nerve. Subsequently, the floor of the FCR tendon sheath was incised over the distal end of the radius.  The flexor pollicis longus (FPL) was swept ulnarly to reveal the pronator quadratus.  The pronator quadratus fascia was incised from its distal and radial borders. A periosteal elevator was utilized to mobilize the pronator quadratus muscle off the distal radius.  The fracture site was irrigated and prepared for reduction with a freer and adson forceps. There were 3 distal radius fracture fragments. The brachioradialis tendon insertion was released to facilitate reduction.  This release was performed by identifying the broad insertion of  the brachiradialis tendon and also identifying the 1st dorsal compartment tendons.  The 1st dorsal  compartment tendons were protected, the broad tendon insertion was released under direct visualization. The fracture was reduced and provisionally fixed with K-wires. We then selected a proper length and width volar plate.  The plate was placed on the distal end of the radius with the fracture reduced and secured to the bone. Using mini C-arm the fracture reduction and position of the plate were deemed to be satisfactory.  We proceeded with securing the plate to the radius with the 1 bicortical nonlocking screw in the oblong portion of the shaft.  With the intermediate column reduced, we secured the distal end of the plate with 2 screws in the distal ulnar portion of plate.  We again used the C-arm to verify satisfactory plate position as well as fracture reduction. The radial column was then reduced and radial styloid locking screws were placed. The remainder shaft screws were placed through the plate. We used the mini C-arm to verify satisfactory plate position, screw lengths and fracture reduction. The DRUJ was then tested in neutral, pronation and supination and was found to be stable.The tourniquet was deflated. Meticulous hemostasis was obtained. The incision was copiously irrigated with normal saline and closed with interrupted 4-0 absorbable horizontal mattress sutures. The incision was covered with xeroform, sterile guaze, webril and well padded short arm splint. The fingers were pink, warm and well perfused. All counts were correct. The patient was awoken from anesthesia and brought to PACU for recovery in stable condition.   Rachel Ovens, MD Orthopaedic Hand Surgery

## 2022-10-21 NOTE — Anesthesia Postprocedure Evaluation (Signed)
Anesthesia Post Note  Patient: Juliona Brightman [facility]  Procedure(s) Performed: OPEN REDUCTION INTERNAL FIXATION (ORIF) DISTAL RADIUS FRACTURE (Left: Wrist)     Patient location during evaluation: PACU Anesthesia Type: MAC Level of consciousness: awake and alert Pain management: pain level controlled Vital Signs Assessment: post-procedure vital signs reviewed and stable Respiratory status: spontaneous breathing, nonlabored ventilation, respiratory function stable and patient connected to nasal cannula oxygen Cardiovascular status: stable and blood pressure returned to baseline Postop Assessment: no apparent nausea or vomiting Anesthetic complications: no  No notable events documented.  Last Vitals:  Vitals:   10/21/22 0855 10/21/22 0900  BP:  (!) 156/92  Pulse: 81 90  Resp: 14 20  Temp:    SpO2: 94% 95%    Last Pain:  Vitals:   10/21/22 0900  PainSc: 3                  Khushboo Chuck S

## 2022-10-21 NOTE — Anesthesia Procedure Notes (Signed)
Anesthesia Procedure Image    

## 2022-10-21 NOTE — Anesthesia Preprocedure Evaluation (Signed)
Anesthesia Evaluation  Patient identified by MRN, date of birth, ID band Patient awake    Reviewed: Allergy & Precautions, H&P , NPO status , Patient's Chart, lab work & pertinent test results  Airway Mallampati: II  TM Distance: >3 FB Neck ROM: Full    Dental no notable dental hx.    Pulmonary neg pulmonary ROS   Pulmonary exam normal breath sounds clear to auscultation       Cardiovascular negative cardio ROS Normal cardiovascular exam Rhythm:Regular Rate:Normal     Neuro/Psych negative neurological ROS  negative psych ROS   GI/Hepatic negative GI ROS, Neg liver ROS,,,  Endo/Other  negative endocrine ROS    Renal/GU negative Renal ROS  negative genitourinary   Musculoskeletal negative musculoskeletal ROS (+)    Abdominal   Peds negative pediatric ROS (+)  Hematology negative hematology ROS (+)   Anesthesia Other Findings   Reproductive/Obstetrics negative OB ROS                             Anesthesia Physical Anesthesia Plan  ASA: 2  Anesthesia Plan: MAC   Post-op Pain Management: Regional block*   Induction: Intravenous  PONV Risk Score and Plan: 2 and Propofol infusion, Ondansetron and Treatment may vary due to age or medical condition  Airway Management Planned: Simple Face Mask  Additional Equipment:   Intra-op Plan:   Post-operative Plan:   Informed Consent: I have reviewed the patients History and Physical, chart, labs and discussed the procedure including the risks, benefits and alternatives for the proposed anesthesia with the patient or authorized representative who has indicated his/her understanding and acceptance.     Dental advisory given  Plan Discussed with: CRNA and Surgeon  Anesthesia Plan Comments:        Anesthesia Quick Evaluation

## 2022-10-21 NOTE — Progress Notes (Signed)
Assisted Dr. Kalman Shan with left, supraclavicular, ultrasound guided block. Side rails up, monitors on throughout procedure. See vital signs in flow sheet. Tolerated Procedure well. ?

## 2022-10-21 NOTE — Anesthesia Procedure Notes (Signed)
Anesthesia Regional Block: Supraclavicular block   Pre-Anesthetic Checklist: , timeout performed,  Correct Patient, Correct Site, Correct Laterality,  Correct Procedure, Correct Position, site marked,  Risks and benefits discussed,  Surgical consent,  Pre-op evaluation,  At surgeon's request and post-op pain management  Laterality: Left  Prep: chloraprep       Needles:  Injection technique: Single-shot  Needle Type: Echogenic Needle     Needle Length: 9cm      Additional Needles:   Procedures:,,,, ultrasound used (permanent image in chart),,    Narrative:  Start time: 10/21/2022 6:55 AM End time: 10/21/2022 7:05 AM Injection made incrementally with aspirations every 5 mL.  Performed by: Personally  Anesthesiologist: Eilene Ghazi, MD  Additional Notes: Patient tolerated the procedure well without complications

## 2022-10-21 NOTE — Interval H&P Note (Signed)
History and Physical Interval Note:  10/21/2022 7:37 AM  Rachel [name]  has presented today for surgery, with the diagnosis of Closed fracture of distal end of left radius.  The various methods of treatment have been discussed with the patient and family. After consideration of risks, benefits and other options for treatment, the patient has consented to  Procedure(s) with comments: OPEN REDUCTION INTERNAL FIXATION (ORIF) WRIST FRACTURE (Left) - block and MAC 60 min as a surgical intervention.  The patient's history has been reviewed, patient examined, no change in status, stable for surgery.  I have reviewed the patient's chart and labs.  Questions were answered to the patient's satisfaction.     Gomez Cleverly

## 2022-10-21 NOTE — Anesthesia Procedure Notes (Signed)
Procedure Name: MAC Date/Time: 10/21/2022 7:40 AM  Performed by: Cleda Clarks, CRNAPre-anesthesia Checklist: Patient identified, Emergency Drugs available, Suction available, Patient being monitored and Timeout performed Patient Re-evaluated:Patient Re-evaluated prior to induction Oxygen Delivery Method: Simple face mask Placement Confirmation: positive ETCO2

## 2022-10-21 NOTE — Discharge Instructions (Addendum)
No anti-inflammatory medication (ibuprofen, aleve) until after 2:30pm.   Orthopaedic Hand Surgery Discharge Instructions  WEIGHT BEARING STATUS: Non weight bearing on operative extremity  DRESSING CARE: Please keep your dressing/splint/cast clean and dry until your follow-up appointment. You may shower by placing a waterproof covering over your dressing/splint/cast. Contact your surgeon if your splint/cast gets wet. It will need to be changed to prevent skin breakdown.  PAIN CONTROL: First line medications for post operative pain control are Tylenol (acetaminophen) and Motrin (ibuprofen) if you are able to take these medications. If you have been prescribed a medication these can be taken as breakthrough pain medications. Please note that some narcotic pain medication has acetaminophen added and you should never consume more than 4,000mg  of acetaminophen in 24-hour period. Please note that if you are given Toradol (ketorolac) you should not take similar medications such as ibuprofen or naproxen.  DISCHARGE MEDICATIONS: If you have been prescribed medication it was sent electronically to your pharmacy. No changes have been made to your home medications.  ICE/ELEVATION: Ice and elevate your injured extremity as needed. Avoid direct contact of ice with skin.   BANDAGE FEELS TOO TIGHT: If your bandage feels too tight, first make sure you are elevating your fingers as much as possible. The outer layer of the bandage can be unwrapped and reapplied more loosely. If no improvement, you may carefully cut the inner layer longitudinally until the pressure has resolved and then rewrap the outer layer. If you are not comfortable with these instructions, please call the office and the bandage can be changed for you.   FOLLOW UP: You will be called after surgery with an appointment date and time, however if you have not received a phone call within 3 days, please call during regular office hours at [phone]  to schedule a post operative appointment.  Please Seek Medical Attention if: Call MD for: pain or pressure in chest, jaw, arm, back, neck  Call MD for: temperature greater than 101 F for more than 24 hrs Call MD for: difficulty breathing Call MD for: incision redness, bleeding, drainage  Call MD for: palpitations or feeling that the heart is racing  Call MD for: increased swelling in arm, leg, ankle, or abdomen  Call MD for: lightheadedness, dizziness, fainting Call 911 or go to ER for any medical emergency if you are not able to get in touch with your doctor   J. Standley Dakins, MD Orthopaedic Hand Surgeon [facility] Office number: [phone] [address], [address] [place], [place] [address]  Regional Anesthesia Blocks  1. You may not be able to move or feel the "blocked" extremity after a regional anesthetic block. This may last may last from 3-48 hours after placement, but it will go away. The length of time depends on the medication injected and your individual response to the medication. As the nerves start to wake up, you may experience tingling as the movement and feeling returns to your extremity. If the numbness and inability to move your extremity has not gone away after 48 hours, please call your surgeon.   2. The extremity that is blocked will need to be protected until the numbness is gone and the strength has returned. Because you cannot feel it, you will need to take extra care to avoid injury. Because it may be weak, you may have difficulty moving it or using it. You may not know what position it is in without looking at it while the block is in effect.  3.  For blocks in the legs and feet, returning to weight bearing and walking needs to be done carefully. You will need to wait until the numbness is entirely gone and the strength has returned. You should be able to move your leg and foot normally before you try and bear weight or walk. You will need someone to be with you  when you first try to ensure you do not fall and possibly risk injury.  4. Bruising and tenderness at the needle site are common side effects and will resolve in a few days.  5. Persistent numbness or new problems with movement should be communicated to the surgeon or the [facility] [phone] [facility] [phone]). Post Anesthesia Home Care Instructions  Activity: Get plenty of rest for the remainder of the day. A responsible individual must stay with you for 24 hours following the procedure.  For the next 24 hours, DO NOT: -Drive a car -Advertising copywriter -Drink alcoholic beverages -Take any medication unless instructed by your physician -Make any legal decisions or sign important papers.  Meals: Start with liquid foods such as gelatin or soup. Progress to regular foods as tolerated. Avoid greasy, spicy, heavy foods. If nausea and/or vomiting occur, drink only clear liquids until the nausea and/or vomiting subsides. Call your physician if vomiting continues.  Special Instructions/Symptoms: Your throat may feel dry or sore from the anesthesia or the breathing tube placed in your throat during surgery. If this causes discomfort, gargle with warm salt water. The discomfort should disappear within 24 hours.  If you had a scopolamine patch placed behind your ear for the management of post- operative nausea and/or vomiting:  1. The medication in the patch is effective for 72 hours, after which it should be removed.  Wrap patch in a tissue and discard in the trash. Wash hands thoroughly with soap and water. 2. You may remove the patch earlier than 72 hours if you experience unpleasant side effects which may include dry mouth, dizziness or visual disturbances. 3. Avoid touching the patch. Wash your hands with soap and water after contact with the patch.

## 2022-10-21 NOTE — Transfer of Care (Signed)
Immediate Anesthesia Transfer of Care Note  Patient: Rachel [name] [facility]  Procedure(s) Performed: OPEN REDUCTION INTERNAL FIXATION (ORIF) DISTAL RADIUS FRACTURE (Left: Wrist)  Patient Location: PACU  Anesthesia Type:MAC and MAC combined with regional for post-op pain  Level of Consciousness: awake, alert , and oriented  Airway & Oxygen Therapy: Patient Spontanous Breathing and Patient connected to nasal cannula oxygen  Post-op Assessment: Report given to RN and Post -op Vital signs reviewed and stable  Post vital signs: Reviewed and stable  Last Vitals:  Vitals Value Taken Time  BP 112/64 10/21/22 0835  Temp    Pulse 108 10/21/22 0836  Resp 18 10/21/22 0836  SpO2 94 % 10/21/22 0836  Vitals shown include unfiled device data.  Last Pain:  Vitals:   10/21/22 0637  PainSc: 7       Patients Stated Pain Goal: 3 (10/21/22 4782)  Complications: No notable events documented.

## 2022-10-22 ENCOUNTER — Encounter (HOSPITAL_BASED_OUTPATIENT_CLINIC_OR_DEPARTMENT_OTHER): Payer: Self-pay | Admitting: Orthopedic Surgery

## 2022-10-24 ENCOUNTER — Other Ambulatory Visit: Payer: Self-pay

## 2022-10-29 ENCOUNTER — Encounter (HOSPITAL_BASED_OUTPATIENT_CLINIC_OR_DEPARTMENT_OTHER): Payer: Self-pay | Admitting: Obstetrics & Gynecology

## 2022-11-19 ENCOUNTER — Other Ambulatory Visit: Payer: Self-pay

## 2022-12-08 ENCOUNTER — Other Ambulatory Visit: Payer: Self-pay

## 2022-12-15 ENCOUNTER — Encounter: Payer: Self-pay | Admitting: Hematology & Oncology

## 2022-12-15 ENCOUNTER — Inpatient Hospital Stay: Payer: BC Managed Care – PPO | Attending: Hematology & Oncology

## 2022-12-15 ENCOUNTER — Encounter: Payer: Self-pay | Admitting: *Deleted

## 2022-12-15 ENCOUNTER — Inpatient Hospital Stay (HOSPITAL_BASED_OUTPATIENT_CLINIC_OR_DEPARTMENT_OTHER): Payer: BC Managed Care – PPO | Admitting: Hematology & Oncology

## 2022-12-15 ENCOUNTER — Other Ambulatory Visit: Payer: Self-pay

## 2022-12-15 VITALS — BP 113/48 | HR 68 | Temp 98.2°F | Resp 16 | Ht 68.0 in | Wt 144.0 lb

## 2022-12-15 DIAGNOSIS — Z79811 Long term (current) use of aromatase inhibitors: Secondary | ICD-10-CM | POA: Diagnosis not present

## 2022-12-15 DIAGNOSIS — E7800 Pure hypercholesterolemia, unspecified: Secondary | ICD-10-CM

## 2022-12-15 DIAGNOSIS — C50611 Malignant neoplasm of axillary tail of right female breast: Secondary | ICD-10-CM

## 2022-12-15 DIAGNOSIS — Z170 Estrogen receptor positive status [ER+]: Secondary | ICD-10-CM | POA: Insufficient documentation

## 2022-12-15 DIAGNOSIS — Z171 Estrogen receptor negative status [ER-]: Secondary | ICD-10-CM | POA: Diagnosis not present

## 2022-12-15 DIAGNOSIS — Z9011 Acquired absence of right breast and nipple: Secondary | ICD-10-CM | POA: Diagnosis not present

## 2022-12-15 DIAGNOSIS — Z79899 Other long term (current) drug therapy: Secondary | ICD-10-CM | POA: Diagnosis not present

## 2022-12-15 DIAGNOSIS — R232 Flushing: Secondary | ICD-10-CM | POA: Insufficient documentation

## 2022-12-15 DIAGNOSIS — Z9221 Personal history of antineoplastic chemotherapy: Secondary | ICD-10-CM | POA: Insufficient documentation

## 2022-12-15 DIAGNOSIS — R197 Diarrhea, unspecified: Secondary | ICD-10-CM | POA: Diagnosis not present

## 2022-12-15 DIAGNOSIS — C50911 Malignant neoplasm of unspecified site of right female breast: Secondary | ICD-10-CM | POA: Insufficient documentation

## 2022-12-15 LAB — CBC WITH DIFFERENTIAL (CANCER CENTER ONLY)
Abs Immature Granulocytes: 0.01 10*3/uL (ref 0.00–0.07)
Basophils Absolute: 0.1 10*3/uL (ref 0.0–0.1)
Basophils Relative: 1 %
Eosinophils Absolute: 0.2 10*3/uL (ref 0.0–0.5)
Eosinophils Relative: 2 %
HCT: 43.4 % (ref 36.0–46.0)
Hemoglobin: 14.4 g/dL (ref 12.0–15.0)
Immature Granulocytes: 0 %
Lymphocytes Relative: 47 %
Lymphs Abs: 2.9 10*3/uL (ref 0.7–4.0)
MCH: 30.1 pg (ref 26.0–34.0)
MCHC: 33.2 g/dL (ref 30.0–36.0)
MCV: 90.8 fL (ref 80.0–100.0)
Monocytes Absolute: 0.4 10*3/uL (ref 0.1–1.0)
Monocytes Relative: 7 %
Neutro Abs: 2.7 10*3/uL (ref 1.7–7.7)
Neutrophils Relative %: 43 %
Platelet Count: 205 10*3/uL (ref 150–400)
RBC: 4.78 MIL/uL (ref 3.87–5.11)
RDW: 13.4 % (ref 11.5–15.5)
WBC Count: 6.3 10*3/uL (ref 4.0–10.5)
nRBC: 0 % (ref 0.0–0.2)

## 2022-12-15 LAB — CMP (CANCER CENTER ONLY)
ALT: 14 U/L (ref 0–44)
AST: 16 U/L (ref 15–41)
Albumin: 4.7 g/dL (ref 3.5–5.0)
Alkaline Phosphatase: 98 U/L (ref 38–126)
Anion gap: 7 (ref 5–15)
BUN: 18 mg/dL (ref 6–20)
CO2: 29 mmol/L (ref 22–32)
Calcium: 10.1 mg/dL (ref 8.9–10.3)
Chloride: 106 mmol/L (ref 98–111)
Creatinine: 0.91 mg/dL (ref 0.44–1.00)
GFR, Estimated: >60 mL/min (ref >60)
Glucose, Bld: 112 mg/dL — ABNORMAL HIGH (ref 70–99)
Potassium: 4.9 mmol/L (ref 3.5–5.1)
Sodium: 142 mmol/L (ref 135–145)
Total Bilirubin: 0.5 mg/dL (ref <1.2)
Total Protein: 7.1 g/dL (ref 6.5–8.1)

## 2022-12-15 LAB — LIPID PANEL
Cholesterol: 257 mg/dL — ABNORMAL HIGH (ref 0–200)
HDL: 43 mg/dL (ref >40)
LDL Cholesterol: 198 mg/dL — ABNORMAL HIGH (ref 0–99)
Total CHOL/HDL Ratio: 6 {ratio}
Triglycerides: 79 mg/dL (ref <150)
VLDL: 16 mg/dL (ref 0–40)

## 2022-12-15 LAB — TSH: TSH: 0.094 u[IU]/mL — ABNORMAL LOW (ref 0.350–4.500)

## 2022-12-15 NOTE — Progress Notes (Signed)
Hematology and Oncology Follow Up Visit  Rachel [name] [facility] [id number] Jul 23, 1965 57 y.o. 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 daily-start on 05/13/2022 --on hold starting 12/15/2022     Interim History:  [name] is back for followup.  Unfortunately, a lot has happened since we last saw her.  In September, she tripped and fell.  She unfortunately broke her left wrist.  She has surgery for this on 10/21/2022.  She still is in a soft cast.  She is doing some physical therapy.  She just does not feel all that great.  I am unsure exactly what could be going on.  She is having mild hot flashes.  We will go ahead and stop the Aromasin for right now.  We will see if this makes her feel better.  She has had some diarrhea..  There is been no cough.  She has had no problems with COVID.  She has had no rashes.  There is been no leg swelling.  Back in June, her TSH was 0.12.  She has had no fever.  Hopefully, she will be able to do little more with the left arm.  She sees the Orthopedist this week.  She is still working.  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  Overall, I would say that her performance status is probably ECOG 1.   Medications:  Current Outpatient Medications:    acetaminophen (TYLENOL) 500 MG tablet, Take 1,000 mg by mouth every 6 (six) hours as needed for moderate pain or mild pain., Disp: , Rfl:    butalbital-acetaminophen-caffeine (FIORICET, ESGIC) 50-325-40 MG per tablet, TAKE 1 TABLET BY MOUTH EVERY 6 HOURS AS NEEDED FOR HEADACHE, Disp: 20 tablet, Rfl: 0   Cholecalciferol (VITAMIN D PO), Take 1,000 Units by mouth daily., Disp: , Rfl:    exemestane (AROMASIN) 25 MG tablet, Take 1 tablet (25 mg total) by mouth  daily after breakfast., Disp: 30 tablet, Rfl: 12   Fezolinetant (VEOZAH) 45 MG TABS, Take 1 tablet (45 mg total) by mouth daily., Disp: 30 tablet, Rfl: 1   fluticasone (FLONASE) 50 MCG/ACT nasal spray, INHALE 2 SPRASY IN EACH NOSTRIL ONCE DAILY, Disp: 16 g, Rfl: 11   ibuprofen (ADVIL) 600 MG tablet, Take 600 mg by mouth every 8 (eight) hours., Disp: , Rfl:    oxyCODONE (ROXICODONE) 5 MG immediate release tablet, Take 1 tablet (5 mg total) by mouth every 6 (six) hours as needed., Disp: 20 tablet, Rfl: 0   pantoprazole (PROTONIX) 40 MG tablet, Take 1 tablet (40 mg total) by mouth daily., Disp: 90 tablet, Rfl: 3   promethazine (PHENERGAN) 25 MG tablet, TAKE 1 TABLET BY MOUTH EVERY 8 HOURS AS NEEDED FOR NAUSEA/VOMITING, Disp: 20 tablet, Rfl: 0   promethazine (PHENERGAN) 25 MG tablet, Take 1 tablet (25 mg total) by mouth every 8 (eight) hours as needed for nausea or vomiting., Disp: 30 tablet, Rfl: 0   rizatriptan (MAXALT) 10 MG tablet, TAKE 1 TABLET BY MOUTH EVERY DAY AS NEEDED FOR MIGRANE, Disp: 6 tablet, Rfl: 5   UNITHROID 112 MCG tablet, TAKE 1 TABLET BY MOUTH EVERY DAY BEFORE BREAKFAST, Disp: 90 tablet, Rfl: 1  Current Facility-Administered Medications:    ondansetron (ZOFRAN) injection 8 mg,  8 mg, Intravenous, Once, Ibrohim Simmers, Rose Phi, MD  Allergies:  Allergies  Allergen Reactions   Anesthetics, Ester Nausea And Vomiting   Erythromycin Nausea And Vomiting    Past Medical History, Surgical history, Social history, and Family History were reviewed and updated.  Review of Systems: Review of Systems  Constitutional: Negative.   HENT:  Positive for congestion.   Eyes: Negative.   Respiratory: Negative.    Cardiovascular: Negative.   Gastrointestinal: Negative.   Genitourinary: Negative.   Musculoskeletal: Negative.   Skin: Negative.   Neurological: Negative.   Endo/Heme/Allergies: Negative.   Psychiatric/Behavioral: Negative.       Physical Exam:  Vital signs are temperature 98.2.   Pulse 68.  Blood pressure 113/48.  Weight is 144 pounds.  Physical Exam Vitals reviewed.  Constitutional:      Comments: Her breast exam shows right mastectomy.  She has reconstruction on this now.  This is healing.  There is no erythema.  There is no warmth or swelling.  .  There is no right axillary adenopathy.  Left breast is unremarkable.  There is no mass in the left breast.  There is no left axillary adenopathy.    HENT:     Head: Normocephalic and atraumatic.  Eyes:     Pupils: Pupils are equal, round, and reactive to light.  Cardiovascular:     Rate and Rhythm: Normal rate and regular rhythm.     Heart sounds: Normal heart sounds.  Pulmonary:     Effort: Pulmonary effort is normal.     Breath sounds: Normal breath sounds.  Abdominal:     General: Bowel sounds are normal.     Palpations: Abdomen is soft.     Comments: Abdominal exam shows a well-healed reconstruction scar in the pelvic area.  She has a umbilical surgery scar that is healing.  There is no masses.  There is no tenderness.  There is no fluid wave.  Musculoskeletal:        General: No tenderness or deformity. Normal range of motion.     Cervical back: Normal range of motion.     Comments: Extremities shows a soft cast on her left forearm.  She does have the healed surgical scar.  Lymphadenopathy:     Cervical: No cervical adenopathy.  Skin:    General: Skin is warm and dry.     Findings: No erythema or rash.  Neurological:     Mental Status: She is alert and oriented to person, place, and time.  Psychiatric:        Behavior: Behavior normal.        Thought Content: Thought content normal.        Judgment: Judgment normal.     Lab Results  Component Value Date   WBC 5.2 07/14/2022   HGB 13.9 07/14/2022   HCT 42.6 07/14/2022   MCV 91.8 07/14/2022   PLT 249 07/14/2022     Chemistry      Component Value Date/Time   NA 146 (H) 07/14/2022 0805   NA 140 09/26/2016 0743   NA 143 09/21/2015 0854   K 4.8  07/14/2022 0805   K 3.8 09/26/2016 0743   K 4.4 09/21/2015 0854   CL 108 07/14/2022 0805   CL 104 09/26/2016 0743   CO2 31 07/14/2022 0805   CO2 30 09/26/2016 0743   CO2 28 09/21/2015 0854   BUN 23 (H) 07/14/2022 0805   BUN 14 09/26/2016 0743   BUN 13.9 09/21/2015 0854  CREATININE 0.92 07/14/2022 0805   CREATININE 0.87 01/01/2018 1055   CREATININE 0.8 09/21/2015 0854      Component Value Date/Time   CALCIUM 10.1 07/14/2022 0805   CALCIUM 9.4 09/26/2016 0743   CALCIUM 10.1 09/21/2015 0854   ALKPHOS 91 07/14/2022 0805   ALKPHOS 80 09/26/2016 0743   ALKPHOS 98 09/21/2015 0854   AST 17 07/14/2022 0805   AST 37 (H) 09/21/2015 0854   ALT 14 07/14/2022 0805   ALT 35 09/26/2016 0743   ALT 57 (H) 09/21/2015 0854   BILITOT 0.6 07/14/2022 0805   BILITOT 0.53 09/21/2015 0854      Impression and Plan: Ms. Manzo is a 57 year old white female with a history of stage I ductal carcinoma of the right breast. She was diagnosed 22 years ago. Her tumor was ER positive. I  think that  she is cured.  Now, she has a second breast cancer.  Again I feel this is not a recurrence in the breast.  I feel this is a second breast cancer.  Again, she completed her adjuvant chemotherapy.  She went on to [place] to have surgery for the reconstruction.  She did well with this.  We will go ahead and stop the Aromasin for right now.,  I am not sure if this might be causing her issues.  If so, then we might be able to switch over to a different aromatase inhibitor.  I really feel that she has the broken left forearm.  Hopefully, she will be able to do physical therapy and regain some use of that arm.  I will let have her come back in about 6 weeks.   Josph Macho, MD 11/4/20248:11 AM

## 2022-12-16 ENCOUNTER — Other Ambulatory Visit: Payer: Self-pay

## 2022-12-17 ENCOUNTER — Other Ambulatory Visit: Payer: Self-pay

## 2022-12-17 DIAGNOSIS — E038 Other specified hypothyroidism: Secondary | ICD-10-CM

## 2022-12-17 MED ORDER — LEVOTHYROXINE SODIUM 100 MCG PO TABS
100.0000 ug | ORAL_TABLET | Freq: Every day | ORAL | 1 refills | Status: DC
Start: 2022-12-17 — End: 2023-05-05

## 2022-12-18 ENCOUNTER — Other Ambulatory Visit: Payer: Self-pay

## 2022-12-22 ENCOUNTER — Other Ambulatory Visit: Payer: Self-pay

## 2023-01-10 ENCOUNTER — Other Ambulatory Visit (HOSPITAL_BASED_OUTPATIENT_CLINIC_OR_DEPARTMENT_OTHER): Payer: Self-pay | Admitting: Obstetrics & Gynecology

## 2023-01-26 ENCOUNTER — Telehealth: Payer: Self-pay

## 2023-01-26 ENCOUNTER — Inpatient Hospital Stay (HOSPITAL_BASED_OUTPATIENT_CLINIC_OR_DEPARTMENT_OTHER): Payer: BC Managed Care – PPO | Admitting: Hematology & Oncology

## 2023-01-26 ENCOUNTER — Other Ambulatory Visit: Payer: Self-pay

## 2023-01-26 ENCOUNTER — Inpatient Hospital Stay: Payer: BC Managed Care – PPO | Attending: Hematology & Oncology

## 2023-01-26 ENCOUNTER — Encounter: Payer: Self-pay | Admitting: Hematology & Oncology

## 2023-01-26 VITALS — BP 132/56 | HR 52 | Temp 98.1°F | Resp 16 | Ht 68.0 in | Wt 147.0 lb

## 2023-01-26 DIAGNOSIS — R110 Nausea: Secondary | ICD-10-CM | POA: Diagnosis not present

## 2023-01-26 DIAGNOSIS — C50611 Malignant neoplasm of axillary tail of right female breast: Secondary | ICD-10-CM | POA: Diagnosis not present

## 2023-01-26 DIAGNOSIS — Z171 Estrogen receptor negative status [ER-]: Secondary | ICD-10-CM | POA: Diagnosis not present

## 2023-01-26 DIAGNOSIS — Z9221 Personal history of antineoplastic chemotherapy: Secondary | ICD-10-CM | POA: Diagnosis not present

## 2023-01-26 DIAGNOSIS — Z9011 Acquired absence of right breast and nipple: Secondary | ICD-10-CM | POA: Diagnosis not present

## 2023-01-26 DIAGNOSIS — Z79811 Long term (current) use of aromatase inhibitors: Secondary | ICD-10-CM | POA: Diagnosis not present

## 2023-01-26 DIAGNOSIS — C50911 Malignant neoplasm of unspecified site of right female breast: Secondary | ICD-10-CM | POA: Diagnosis present

## 2023-01-26 DIAGNOSIS — Z170 Estrogen receptor positive status [ER+]: Secondary | ICD-10-CM | POA: Diagnosis not present

## 2023-01-26 DIAGNOSIS — R109 Unspecified abdominal pain: Secondary | ICD-10-CM | POA: Insufficient documentation

## 2023-01-26 DIAGNOSIS — R112 Nausea with vomiting, unspecified: Secondary | ICD-10-CM

## 2023-01-26 DIAGNOSIS — Z79899 Other long term (current) drug therapy: Secondary | ICD-10-CM | POA: Diagnosis not present

## 2023-01-26 LAB — CBC WITH DIFFERENTIAL (CANCER CENTER ONLY)
Abs Immature Granulocytes: 0 10*3/uL (ref 0.00–0.07)
Basophils Absolute: 0.1 10*3/uL (ref 0.0–0.1)
Basophils Relative: 1 %
Eosinophils Absolute: 0.1 10*3/uL (ref 0.0–0.5)
Eosinophils Relative: 2 %
HCT: 42.2 % (ref 36.0–46.0)
Hemoglobin: 14.2 g/dL (ref 12.0–15.0)
Immature Granulocytes: 0 %
Lymphocytes Relative: 45 %
Lymphs Abs: 2.6 10*3/uL (ref 0.7–4.0)
MCH: 30.7 pg (ref 26.0–34.0)
MCHC: 33.6 g/dL (ref 30.0–36.0)
MCV: 91.3 fL (ref 80.0–100.0)
Monocytes Absolute: 0.4 10*3/uL (ref 0.1–1.0)
Monocytes Relative: 6 %
Neutro Abs: 2.7 10*3/uL (ref 1.7–7.7)
Neutrophils Relative %: 46 %
Platelet Count: 229 10*3/uL (ref 150–400)
RBC: 4.62 MIL/uL (ref 3.87–5.11)
RDW: 13 % (ref 11.5–15.5)
WBC Count: 5.9 10*3/uL (ref 4.0–10.5)
nRBC: 0 % (ref 0.0–0.2)

## 2023-01-26 LAB — CMP (CANCER CENTER ONLY)
ALT: 17 U/L (ref 0–44)
AST: 16 U/L (ref 15–41)
Albumin: 4.4 g/dL (ref 3.5–5.0)
Alkaline Phosphatase: 88 U/L (ref 38–126)
Anion gap: 6 (ref 5–15)
BUN: 21 mg/dL — ABNORMAL HIGH (ref 6–20)
CO2: 29 mmol/L (ref 22–32)
Calcium: 9.7 mg/dL (ref 8.9–10.3)
Chloride: 107 mmol/L (ref 98–111)
Creatinine: 0.93 mg/dL (ref 0.44–1.00)
GFR, Estimated: >60 mL/min (ref >60)
Glucose, Bld: 106 mg/dL — ABNORMAL HIGH (ref 70–99)
Potassium: 4.6 mmol/L (ref 3.5–5.1)
Sodium: 142 mmol/L (ref 135–145)
Total Bilirubin: 0.7 mg/dL (ref <1.2)
Total Protein: 6.7 g/dL (ref 6.5–8.1)

## 2023-01-26 LAB — IRON AND IRON BINDING CAPACITY (CC-WL,HP ONLY)
Iron: 127 ug/dL (ref 28–170)
Saturation Ratios: 30 % (ref 10.4–31.8)
TIBC: 426 ug/dL (ref 250–450)
UIBC: 299 ug/dL (ref 148–442)

## 2023-01-26 LAB — FERRITIN: Ferritin: 16 ng/mL (ref 11–307)

## 2023-01-26 NOTE — Telephone Encounter (Signed)
Advised via MyChart.

## 2023-01-26 NOTE — Progress Notes (Signed)
Hematology and Oncology Follow Up Visit  Rachel [name] [facility] [id number] 06/10/1965 57 y.o. 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 daily-start on 05/13/2022 --on hold starting 12/15/2022  --restarted on 01/22/2023     Interim History:  [name] is back for followup.  She still dealing with the left hand and wrist.  She is doing physical therapy.  She has a soft cast on.  She is having a slow recovery.  I would think that with the damage that she did, that she would probably need a good year or 2.  She still is having some abdominal pain and nausea.  Will get a gallbladder scan on her make sure he does not have any issues with cholecystitis.  She is to restart the Aromasin because she did not get any better being off Aromasin.  She has had no fever.  She has had no bleeding.  There is been no change in bowel or bladder habits.  She has had no cough or shortness of breath she has had no headache.  Overall, I would say performance status is probably ECOG 1.    Medications:  Current Outpatient Medications:    acetaminophen (TYLENOL) 500 MG tablet, Take 1,000 mg by mouth every 6 (six) hours as needed for moderate pain or mild pain., Disp: , Rfl:    butalbital-acetaminophen-caffeine (FIORICET, ESGIC) 50-325-40 MG per tablet, TAKE 1 TABLET BY MOUTH EVERY 6 HOURS AS NEEDED FOR HEADACHE, Disp: 20 tablet, Rfl: 0   Cholecalciferol (VITAMIN D PO), Take 1,000 Units by mouth daily., Disp: , Rfl:    exemestane (AROMASIN) 25 MG tablet, Take 1 tablet (25 mg total) by mouth daily after breakfast., Disp: 30 tablet, Rfl: 12   Fezolinetant (VEOZAH) 45 MG TABS, TAKE 1 TABLET BY MOUTH EVERY DAY, Disp: 30 tablet, Rfl: 2   fluticasone (FLONASE) 50 MCG/ACT  nasal spray, INHALE 2 SPRASY IN EACH NOSTRIL ONCE DAILY, Disp: 16 g, Rfl: 11   ibuprofen (ADVIL) 600 MG tablet, Take 600 mg by mouth every 8 (eight) hours., Disp: , Rfl:    levothyroxine (UNITHROID) 100 MCG tablet, Take 1 tablet (100 mcg total) by mouth daily before breakfast., Disp: 90 tablet, Rfl: 1   pantoprazole (PROTONIX) 40 MG tablet, Take 1 tablet (40 mg total) by mouth daily., Disp: 90 tablet, Rfl: 3   promethazine (PHENERGAN) 25 MG tablet, Take 1 tablet (25 mg total) by mouth every 8 (eight) hours as needed for nausea or vomiting., Disp: 30 tablet, Rfl: 0   rizatriptan (MAXALT) 10 MG tablet, TAKE 1 TABLET BY MOUTH EVERY DAY AS NEEDED FOR MIGRANE, Disp: 6 tablet, Rfl: 5  Current Facility-Administered Medications:    ondansetron (ZOFRAN) injection 8 mg, 8 mg, Intravenous, Once, Gwynneth Fabio, Rose Phi, MD  Allergies:  Allergies  Allergen Reactions   Anesthetics, Ester Nausea And Vomiting   Erythromycin Nausea And Vomiting    Past Medical History, Surgical history, Social history, and Family History were reviewed and updated.  Review of Systems: Review of Systems  Constitutional: Negative.   HENT:  Positive for congestion.   Eyes: Negative.   Respiratory: Negative.    Cardiovascular: Negative.   Gastrointestinal: Negative.   Genitourinary: Negative.   Musculoskeletal: Negative.   Skin: Negative.   Neurological:  Negative.   Endo/Heme/Allergies: Negative.   Psychiatric/Behavioral: Negative.       Physical Exam:  Vital signs are temperature 98.2.  Pulse 68.  Blood pressure 113/48.  Weight is 144 pounds.  Physical Exam Vitals reviewed.  Constitutional:      Comments: Her breast exam shows right mastectomy.  She has reconstruction on this now.  This is healing.  There is no erythema.  There is no warmth or swelling.  .  There is no right axillary adenopathy.  Left breast is unremarkable.  There is no mass in the left breast.  There is no left axillary adenopathy.    HENT:      Head: Normocephalic and atraumatic.  Eyes:     Pupils: Pupils are equal, round, and reactive to light.  Cardiovascular:     Rate and Rhythm: Normal rate and regular rhythm.     Heart sounds: Normal heart sounds.  Pulmonary:     Effort: Pulmonary effort is normal.     Breath sounds: Normal breath sounds.  Abdominal:     General: Bowel sounds are normal.     Palpations: Abdomen is soft.     Comments: Abdominal exam shows a well-healed reconstruction scar in the pelvic area.  She has a umbilical surgery scar that is healing.  There is no masses.  There is no tenderness.  There is no fluid wave.  Musculoskeletal:        General: No tenderness or deformity. Normal range of motion.     Cervical back: Normal range of motion.     Comments: Extremities shows a soft cast on her left forearm.  She does have the healed surgical scar.  Lymphadenopathy:     Cervical: No cervical adenopathy.  Skin:    General: Skin is warm and dry.     Findings: No erythema or rash.  Neurological:     Mental Status: She is alert and oriented to person, place, and time.  Psychiatric:        Behavior: Behavior normal.        Thought Content: Thought content normal.        Judgment: Judgment normal.     Lab Results  Component Value Date   WBC 5.9 01/26/2023   HGB 14.2 01/26/2023   HCT 42.2 01/26/2023   MCV 91.3 01/26/2023   PLT 229 01/26/2023     Chemistry      Component Value Date/Time   NA 142 01/26/2023 0844   NA 140 09/26/2016 0743   NA 143 09/21/2015 0854   K 4.6 01/26/2023 0844   K 3.8 09/26/2016 0743   K 4.4 09/21/2015 0854   CL 107 01/26/2023 0844   CL 104 09/26/2016 0743   CO2 29 01/26/2023 0844   CO2 30 09/26/2016 0743   CO2 28 09/21/2015 0854   BUN 21 (H) 01/26/2023 0844   BUN 14 09/26/2016 0743   BUN 13.9 09/21/2015 0854   CREATININE 0.93 01/26/2023 0844   CREATININE 0.87 01/01/2018 1055   CREATININE 0.8 09/21/2015 0854      Component Value Date/Time   CALCIUM 9.7 01/26/2023  0844   CALCIUM 9.4 09/26/2016 0743   CALCIUM 10.1 09/21/2015 0854   ALKPHOS 88 01/26/2023 0844   ALKPHOS 80 09/26/2016 0743   ALKPHOS 98 09/21/2015 0854   AST 16 01/26/2023 0844   AST 37 (H) 09/21/2015 0854   ALT 17 01/26/2023 0844   ALT 35 09/26/2016 0743   ALT 57 (H) 09/21/2015 5409  BILITOT 0.7 01/26/2023 0844   BILITOT 0.53 09/21/2015 0854      Impression and Plan: Rachel [name] is a 57 year old white female with a history of stage I ductal carcinoma of the right breast. She was diagnosed 22 years ago. Her tumor was ER positive. I  think that  she is cured.  Now, she has a second breast cancer.  Again I feel this is not a recurrence in the breast.  I feel this is a second breast cancer.  Again, she completed her adjuvant chemotherapy.  She went on to [place] to have surgery for the reconstruction.  She did well with this.  I feel bad that she is still having problems with the left wrist.  Again, this will take quite a bit of time.  We will plan to get her back to see [place] in about 3 months now.  We will try to get her through the Winter.  Will see what the gallbladder scan looks like.    Josph Macho, MD 12/16/202410:28 AM

## 2023-01-26 NOTE — Telephone Encounter (Signed)
-----   Message from Josph Macho sent at 01/26/2023 12:49 PM EST ----- Please call and let her know that the iron levels are okay.  Thanks.Marland Kitchen

## 2023-01-27 ENCOUNTER — Other Ambulatory Visit: Payer: Self-pay

## 2023-01-27 ENCOUNTER — Encounter: Payer: Self-pay | Admitting: *Deleted

## 2023-01-29 ENCOUNTER — Other Ambulatory Visit: Payer: Self-pay

## 2023-01-29 ENCOUNTER — Telehealth: Payer: Self-pay

## 2023-01-29 DIAGNOSIS — R112 Nausea with vomiting, unspecified: Secondary | ICD-10-CM

## 2023-01-29 NOTE — Telephone Encounter (Signed)
Per [place], financial services:   Patient scan has been denied due to your Dr is checking you for a swelling of the gallbladder causing pain. Your doctor order a special test to check your liver and gallbladder. This test is needed when ultrasound results were unclear. Based on the information we have, this test is not medical necessary.   Per Dr Myna Hidalgo, cancel scan and order abd ultrasound at this time.  Order placed.  dph

## 2023-01-30 ENCOUNTER — Telehealth (HOSPITAL_BASED_OUTPATIENT_CLINIC_OR_DEPARTMENT_OTHER): Payer: Self-pay | Admitting: Hematology & Oncology

## 2023-02-02 ENCOUNTER — Other Ambulatory Visit: Payer: Self-pay | Admitting: Hematology & Oncology

## 2023-02-02 ENCOUNTER — Telehealth: Payer: Self-pay

## 2023-02-02 ENCOUNTER — Encounter: Payer: Self-pay | Admitting: *Deleted

## 2023-02-02 ENCOUNTER — Ambulatory Visit (HOSPITAL_BASED_OUTPATIENT_CLINIC_OR_DEPARTMENT_OTHER)
Admission: RE | Admit: 2023-02-02 | Discharge: 2023-02-02 | Disposition: A | Discharge status: Home / Self Care | Payer: BC Managed Care – PPO | Source: Ambulatory Visit | Attending: Hematology & Oncology | Admitting: Hematology & Oncology

## 2023-02-02 DIAGNOSIS — R112 Nausea with vomiting, unspecified: Secondary | ICD-10-CM | POA: Insufficient documentation

## 2023-02-02 NOTE — Telephone Encounter (Signed)
Advised via MyChart.

## 2023-02-02 NOTE — Telephone Encounter (Signed)
-----   Message from Rachel [name] sent at 02/02/2023  3:28 PM EST ----- Please call and let her know that the ultrasound does not show anything with the gallbladder.  There is no gallstones.  Gallbladder wall is not thickened.  She may have a little bit of hepatic fatty tissue.  This is all related to her cholesterol which is poorly controlled.  Cindee Lame

## 2023-02-09 ENCOUNTER — Ambulatory Visit (INDEPENDENT_AMBULATORY_CARE_PROVIDER_SITE_OTHER): Payer: BC Managed Care – PPO | Admitting: Family Medicine

## 2023-02-09 ENCOUNTER — Encounter: Payer: Self-pay | Admitting: Hematology & Oncology

## 2023-02-09 VITALS — BP 118/64 | HR 63 | Temp 98.4°F | Ht 68.0 in | Wt 147.2 lb

## 2023-02-09 DIAGNOSIS — J0100 Acute maxillary sinusitis, unspecified: Secondary | ICD-10-CM | POA: Diagnosis not present

## 2023-02-09 MED ORDER — AMOXICILLIN 875 MG PO TABS: 875.0000 mg | ORAL_TABLET | Freq: Two times a day (BID) | ORAL | 0 refills | Status: AC

## 2023-02-09 NOTE — Progress Notes (Signed)
Subjective:    Patient ID: Rachel [name], female    DOB: November 06, 1965, 57 y.o.   MRN: [id number]  Patient reports a 2-day history of rhinorrhea, frontal and maxillary sinus pressure bilaterally, headaches.  She reports bloody nasal mucus when she blows her nose.  She denies any sore throat.  She denies any cough.  She denies any shortness of breath.  She has a history of recurrent sinusitis.  Her grandchildren have viral symptoms over Christmas. 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  Prior T score -2.5.  DEXA stable from prior study.   Personal history of chemotherapy 2001   Personal history of radiation therapy 2001   Pneumonia    Had 12-2017   PONV (postoperative nausea and vomiting)    Past Surgical History:  Procedure Laterality Date   BREAST EXCISIONAL BIOPSY Left 2021   BREAST LUMPECTOMY Right 2001   CESAREAN SECTION  1991   COLONOSCOPY     MASTECTOMY W/ SENTINEL NODE BIOPSY Right 06/25/2021   Procedure: RIGHT MASTECTOMY WITH SENTINEL LYMPH NODE BIOPSY;  Surgeon: Almond Lint, MD;  Location: [facility];  Service: General;  Laterality: Right;   NASAL SINUS SURGERY Right 02/20/2020   Procedure: ENDOSCOPIC SINUS SURGERY WITH RIGHT ETHMOIDECTOMY AND RIGHT MAXILLARY OSTIA ENLARGEMENT WITH FUSION;  Surgeon: Drema Halon, MD;  Location: [facility];  Service: ENT;  Laterality: Right;   OPEN REDUCTION INTERNAL FIXATION (ORIF) DISTAL RADIAL FRACTURE Left 10/21/2022   Procedure: OPEN REDUCTION INTERNAL FIXATION (ORIF) DISTAL  RADIUS FRACTURE;  Surgeon: Gomez Cleverly, MD;  Location: [facility];  Service: Orthopedics;  Laterality: Left;   PELVIC LAPAROSCOPY     endometriosis   PORTACATH PLACEMENT N/A 08/07/2021   Procedure: PORT PLACEMENT WITH ULTRASOUND GUIDANCE;  Surgeon: Almond Lint, MD;  Location: [facility];  Service: General;  Laterality: N/A;   SINUS ENDO WITH FUSION Right 02/20/2020   Procedure: SINUS ENDO WITH FUSION;  Surgeon: Drema Halon, MD;  Location:  SURGERY CENTER;  Service: ENT;  Laterality: Right;   TONSILLECTOMY  1980   UPPER GI ENDOSCOPY     WISDOM TOOTH EXTRACTION  1986   Current Outpatient Medications on File Prior to Visit  Medication Sig Dispense Refill   acetaminophen (TYLENOL) 500 MG tablet Take 1,000 mg by mouth every 6 (six) hours as needed for moderate pain or mild pain.     butalbital-acetaminophen-caffeine (FIORICET, ESGIC) 50-325-40 MG per tablet TAKE 1 TABLET BY MOUTH EVERY 6 HOURS AS NEEDED FOR HEADACHE 20 tablet 0   Cholecalciferol (VITAMIN D PO) Take 1,000 Units by mouth daily.     exemestane (AROMASIN) 25 MG tablet Take 1 tablet (25 mg total) by mouth daily after breakfast. 30 tablet 12   Fezolinetant (VEOZAH) 45 MG TABS TAKE 1 TABLET BY MOUTH EVERY DAY 30 tablet 2   fluticasone (FLONASE) 50 MCG/ACT nasal spray INHALE 2 SPRASY IN EACH NOSTRIL ONCE DAILY 16 g 11  ibuprofen (ADVIL) 600 MG tablet Take 600 mg by mouth every 8 (eight) hours.     levothyroxine (UNITHROID) 100 MCG tablet Take 1 tablet (100 mcg total) by mouth daily before breakfast. 90 tablet 1   pantoprazole (PROTONIX) 40 MG tablet Take 1 tablet (40 mg total) by mouth daily. 90 tablet 3   promethazine (PHENERGAN) 25 MG tablet Take 1 tablet (25 mg total) by mouth every 8 (eight) hours as needed for nausea or vomiting. 30 tablet 0   rizatriptan (MAXALT) 10 MG tablet TAKE 1 TABLET BY MOUTH EVERY DAY AS NEEDED FOR MIGRANE 6 tablet 5   [DISCONTINUED] prochlorperazine (COMPAZINE) 10 MG tablet  Take 1 tablet (10 mg total) by mouth every 6 (six) hours as needed (Nausea or vomiting). 30 tablet 1   Current Facility-Administered Medications on File Prior to Visit  Medication Dose Route Frequency Provider Last Rate Last Admin   ondansetron (ZOFRAN) injection 8 mg  8 mg Intravenous Once Ennever, Rose Phi, MD       Allergies  Allergen Reactions   Anesthetics, Ester Nausea And Vomiting   Erythromycin Nausea And Vomiting   Social History   Socioeconomic History   Marital status: Married    Spouse name: Not on file   Number of children: Not on file   Years of education: Not on file   Highest education level: Master's degree (e.g., MA, MS, MEng, MEd, MSW, MBA)  Occupational History   Not on file  Tobacco Use   Smoking status: Never   Smokeless tobacco: Never   Tobacco comments:    never used tobacco  Vaping Use   Vaping status: Never Used  Substance and Sexual Activity   Alcohol use: Not Currently    Alcohol/week: 0.0 standard drinks of alcohol   Drug use: No   Sexual activity: Yes    Birth control/protection: Post-menopausal    Comment: 1st intercourse 57 yo-Fewer than 5 partners  Other Topics Concern   Not on file  Social History Narrative   Not on file   Social Drivers of Health   Financial Resource Strain: Patient Declined (02/09/2023)   Overall Financial Resource Strain (CARDIA)    Difficulty of Paying Living Expenses: Patient declined  Food Insecurity: Patient Declined (02/09/2023)   Hunger Vital Sign    Worried About Running Out of Food in the Last Year: Patient declined    Ran Out of Food in the Last Year: Patient declined  Transportation Needs: Patient Declined (02/09/2023)   PRAPARE - Administrator, Civil Service (Medical): Patient declined    Lack of Transportation (Non-Medical): Patient declined  Physical Activity: Insufficiently Active (02/09/2023)   Exercise Vital Sign    Days of Exercise per Week: 3 days    Minutes of Exercise per  Session: 10 min  Stress: No Stress Concern Present (02/09/2023)   [employer] of Occupational Health - Occupational Stress Questionnaire    Feeling of Stress : Not at all  Social Connections: Unknown (02/09/2023)   Social Connection and Isolation Panel [NHANES]    Frequency of Communication with Friends and Family: Patient declined    Frequency of Social Gatherings with Friends and Family: Patient declined    Attends Religious Services: Patient declined    Database administrator or Organizations: Patient declined    Attends Banker Meetings: Not on file    Marital Status: Married  Catering manager Violence: Not on file      Review of Systems  All other  systems reviewed and are negative.      Objective:   Physical Exam Vitals reviewed.  Constitutional:      General: She is not in acute distress.    Appearance: Normal appearance. She is normal weight. She is not ill-appearing, toxic-appearing or diaphoretic.  HENT:     Head: Normocephalic and atraumatic.     Right Ear: Tympanic membrane, ear canal and external ear normal. There is no impacted cerumen.     Left Ear: Tympanic membrane, ear canal and external ear normal. There is no impacted cerumen.     Nose: Congestion and rhinorrhea present.     Right Turbinates: Swollen.     Left Turbinates: Swollen.     Right Sinus: Maxillary sinus tenderness and frontal sinus tenderness present.     Left Sinus: Maxillary sinus tenderness and frontal sinus tenderness present.     Mouth/Throat:     Pharynx: No oropharyngeal exudate or posterior oropharyngeal erythema.  Eyes:     Conjunctiva/sclera: Conjunctivae normal.  Cardiovascular:     Rate and Rhythm: Normal rate and regular rhythm.     Pulses: Normal pulses.     Heart sounds: Normal heart sounds. No murmur heard.    No friction rub. No gallop.  Pulmonary:     Effort: Pulmonary effort is normal. No respiratory distress.     Breath sounds: Normal breath sounds. No  stridor. No wheezing, rhonchi or rales.  Chest:     Chest wall: No tenderness.  Musculoskeletal:     Cervical back: Normal range of motion.  Lymphadenopathy:     Cervical: No cervical adenopathy.  Neurological:     Mental Status: She is alert.           Assessment & Plan:  Acute maxillary sinusitis, recurrence not specified Patient has symptoms of a viral upper respiratory infection as well as maxillary sinusitis.  I explained to the patient I believe this is most likely viral.  I have recommended that she take Sudafed for nasal congestion or Coricidin.  She can use Flonase 2 sprays times daily.  Also recommended nasal saline rinses.  Anticipate gradual improvement over the next 7 days.  Given her complicated medical history, if symptoms worsen, I gave her a prescription for amoxicillin to take for a sinus infection.  However, I recommended she hold onto that prescription until 7 days at least has passed or unless symptoms drastically worsen.

## 2023-02-16 ENCOUNTER — Other Ambulatory Visit: Payer: Self-pay

## 2023-02-25 ENCOUNTER — Encounter: Payer: Self-pay | Admitting: Gastroenterology

## 2023-03-18 ENCOUNTER — Other Ambulatory Visit: Payer: Self-pay | Admitting: Hematology & Oncology

## 2023-04-10 ENCOUNTER — Other Ambulatory Visit: Payer: Self-pay

## 2023-04-16 ENCOUNTER — Other Ambulatory Visit: Payer: Self-pay

## 2023-04-20 ENCOUNTER — Other Ambulatory Visit (HOSPITAL_BASED_OUTPATIENT_CLINIC_OR_DEPARTMENT_OTHER): Payer: Self-pay | Admitting: Obstetrics & Gynecology

## 2023-04-21 NOTE — Telephone Encounter (Signed)
 LMOM at 11:53 for patient to call office. Need updated labs for Veozah refill. tbw

## 2023-04-22 ENCOUNTER — Other Ambulatory Visit: Payer: Self-pay | Admitting: Hematology & Oncology

## 2023-04-22 DIAGNOSIS — Z1231 Encounter for screening mammogram for malignant neoplasm of breast: Secondary | ICD-10-CM

## 2023-04-24 NOTE — Telephone Encounter (Signed)
 Called patient. No answer. DPR reviewed. LMOM at 10:22 letting patient know that Rx will be called in after results of blood work from 3/24 appt. With Dr. Drue Dun are reviewed. SM/tbw

## 2023-04-26 ENCOUNTER — Encounter (HOSPITAL_BASED_OUTPATIENT_CLINIC_OR_DEPARTMENT_OTHER): Payer: Self-pay | Admitting: Obstetrics & Gynecology

## 2023-04-27 ENCOUNTER — Other Ambulatory Visit (HOSPITAL_BASED_OUTPATIENT_CLINIC_OR_DEPARTMENT_OTHER): Payer: Self-pay | Admitting: *Deleted

## 2023-04-27 MED ORDER — VEOZAH 45 MG PO TABS: 1.0000 | ORAL_TABLET | Freq: Every day | ORAL | 2 refills | Status: DC

## 2023-05-03 ENCOUNTER — Encounter: Payer: Self-pay | Admitting: Hematology & Oncology

## 2023-05-04 ENCOUNTER — Inpatient Hospital Stay: Payer: BC Managed Care – PPO | Attending: Hematology & Oncology

## 2023-05-04 ENCOUNTER — Encounter: Payer: Self-pay | Admitting: Hematology & Oncology

## 2023-05-04 ENCOUNTER — Inpatient Hospital Stay (HOSPITAL_BASED_OUTPATIENT_CLINIC_OR_DEPARTMENT_OTHER): Payer: BC Managed Care – PPO | Admitting: Hematology & Oncology

## 2023-05-04 VITALS — BP 97/51 | HR 71 | Temp 98.3°F | Resp 16 | Ht 68.0 in | Wt 147.0 lb

## 2023-05-04 DIAGNOSIS — C50611 Malignant neoplasm of axillary tail of right female breast: Secondary | ICD-10-CM | POA: Diagnosis not present

## 2023-05-04 DIAGNOSIS — C50911 Malignant neoplasm of unspecified site of right female breast: Secondary | ICD-10-CM | POA: Insufficient documentation

## 2023-05-04 DIAGNOSIS — Z9221 Personal history of antineoplastic chemotherapy: Secondary | ICD-10-CM | POA: Insufficient documentation

## 2023-05-04 DIAGNOSIS — Z79899 Other long term (current) drug therapy: Secondary | ICD-10-CM | POA: Insufficient documentation

## 2023-05-04 DIAGNOSIS — R232 Flushing: Secondary | ICD-10-CM | POA: Insufficient documentation

## 2023-05-04 DIAGNOSIS — E032 Hypothyroidism due to medicaments and other exogenous substances: Secondary | ICD-10-CM | POA: Diagnosis not present

## 2023-05-04 DIAGNOSIS — Z79811 Long term (current) use of aromatase inhibitors: Secondary | ICD-10-CM | POA: Insufficient documentation

## 2023-05-04 DIAGNOSIS — Z171 Estrogen receptor negative status [ER-]: Secondary | ICD-10-CM

## 2023-05-04 DIAGNOSIS — Z9011 Acquired absence of right breast and nipple: Secondary | ICD-10-CM | POA: Insufficient documentation

## 2023-05-04 DIAGNOSIS — Z170 Estrogen receptor positive status [ER+]: Secondary | ICD-10-CM | POA: Diagnosis not present

## 2023-05-04 DIAGNOSIS — R112 Nausea with vomiting, unspecified: Secondary | ICD-10-CM

## 2023-05-04 LAB — CBC WITH DIFFERENTIAL (CANCER CENTER ONLY)
Abs Immature Granulocytes: 0.01 10*3/uL (ref 0.00–0.07)
Basophils Absolute: 0.1 10*3/uL (ref 0.0–0.1)
Basophils Relative: 1 %
Eosinophils Absolute: 0.2 10*3/uL (ref 0.0–0.5)
Eosinophils Relative: 2 %
HCT: 45.3 % (ref 36.0–46.0)
Hemoglobin: 15.1 g/dL — ABNORMAL HIGH (ref 12.0–15.0)
Immature Granulocytes: 0 %
Lymphocytes Relative: 40 %
Lymphs Abs: 3.4 10*3/uL (ref 0.7–4.0)
MCH: 30.9 pg (ref 26.0–34.0)
MCHC: 33.3 g/dL (ref 30.0–36.0)
MCV: 92.8 fL (ref 80.0–100.0)
Monocytes Absolute: 0.7 10*3/uL (ref 0.1–1.0)
Monocytes Relative: 8 %
Neutro Abs: 4.1 10*3/uL (ref 1.7–7.7)
Neutrophils Relative %: 49 %
Platelet Count: 229 10*3/uL (ref 150–400)
RBC: 4.88 MIL/uL (ref 3.87–5.11)
RDW: 12.6 % (ref 11.5–15.5)
WBC Count: 8.4 10*3/uL (ref 4.0–10.5)
nRBC: 0 % (ref 0.0–0.2)

## 2023-05-04 LAB — CMP (CANCER CENTER ONLY)
ALT: 19 U/L (ref 0–44)
AST: 16 U/L (ref 15–41)
Albumin: 4.6 g/dL (ref 3.5–5.0)
Alkaline Phosphatase: 90 U/L (ref 38–126)
Anion gap: 10 (ref 5–15)
BUN: 16 mg/dL (ref 6–20)
CO2: 29 mmol/L (ref 22–32)
Calcium: 9.9 mg/dL (ref 8.9–10.3)
Chloride: 108 mmol/L (ref 98–111)
Creatinine: 0.96 mg/dL (ref 0.44–1.00)
GFR, Estimated: >60 mL/min (ref >60)
Glucose, Bld: 99 mg/dL (ref 70–99)
Potassium: 4.9 mmol/L (ref 3.5–5.1)
Sodium: 147 mmol/L — ABNORMAL HIGH (ref 135–145)
Total Bilirubin: 0.7 mg/dL (ref 0.0–1.2)
Total Protein: 6.7 g/dL (ref 6.5–8.1)

## 2023-05-04 LAB — TSH: TSH: 0.092 u[IU]/mL — ABNORMAL LOW (ref 0.350–4.500)

## 2023-05-04 NOTE — Progress Notes (Signed)
 Hematology and Oncology Follow Up Visit  Rachel [name] [facility] [id number] [birth year] 58 y.o. 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 daily-start on 05/13/2022 --on hold starting 12/15/2022  --restarted on 01/22/2023     Interim History:  Rachel [name] is back for followup.  She still dealing with the left hand and wrist.  She apparently had surgery 6 weeks ago for the left elbow.  Apparently there was a trapped nerve.  This is helped a little bit.  Now, she is having problems with her left shoulder.  She had MRI of the neck which was okay.  She will back to see the doctor today.  I do not know she will need some type of MRI of the shoulder.  Otherwise, she is managing pretty well.  The Aromasin is given some hot flashes.  She has had no cough or shortness of breath.  There is been no change in bowel or bladder habits.  She has had no leg swelling.  She has had no rashes.  She is working.  Overall, I would say that her performance status is probably ECOG 1.    Medications:  Current Outpatient Medications:    acetaminophen (TYLENOL) 500 MG tablet, Take 1,000 mg by mouth every 6 (six) hours as needed for moderate pain or mild pain., Disp: , Rfl:    butalbital-acetaminophen-caffeine (FIORICET, ESGIC) 50-325-40 MG per tablet, TAKE 1 TABLET BY MOUTH EVERY 6 HOURS AS NEEDED FOR HEADACHE, Disp: 20 tablet, Rfl: 0   Cholecalciferol (VITAMIN D PO), Take 1,000 Units by mouth daily., Disp: , Rfl:    exemestane (AROMASIN) 25 MG tablet, Take 1 tablet (25 mg total) by mouth daily after breakfast., Disp: 30 tablet, Rfl: 12   Fezolinetant (VEOZAH) 45 MG TABS, Take 1 tablet (45 mg total) by mouth daily., Disp: 30 tablet, Rfl: 2   fluticasone (FLONASE) 50  MCG/ACT nasal spray, INHALE 2 SPRASY IN EACH NOSTRIL ONCE DAILY, Disp: 16 g, Rfl: 11   ibuprofen (ADVIL) 600 MG tablet, Take 600 mg by mouth every 8 (eight) hours., Disp: , Rfl:    levothyroxine (UNITHROID) 100 MCG tablet, Take 1 tablet (100 mcg total) by mouth daily before breakfast., Disp: 90 tablet, Rfl: 1   pantoprazole (PROTONIX) 40 MG tablet, Take 1 tablet (40 mg total) by mouth daily., Disp: 90 tablet, Rfl: 3   promethazine (PHENERGAN) 25 MG tablet, Take 1 tablet (25 mg total) by mouth every 8 (eight) hours as needed for nausea or vomiting., Disp: 30 tablet, Rfl: 0   rizatriptan (MAXALT) 10 MG tablet, TAKE 1 TABLET BY MOUTH EVERY DAY AS NEEDED FOR MIGRANE, Disp: 6 tablet, Rfl: 5  Current Facility-Administered Medications:    ondansetron (ZOFRAN) injection 8 mg, 8 mg, Intravenous, Once, Aquil Duhe, Rose Phi, MD  Allergies:  Allergies  Allergen Reactions   Anesthetics, Ester Nausea And Vomiting   Erythromycin Nausea And Vomiting    Past Medical History, Surgical history, Social history, and Family History were reviewed and updated.  Review of Systems: Review of Systems  Constitutional: Negative.   HENT:  Positive for congestion.   Eyes: Negative.   Respiratory: Negative.    Cardiovascular: Negative.   Gastrointestinal: Negative.   Genitourinary: Negative.   Musculoskeletal: Negative.   Skin: Negative.  Neurological: Negative.   Endo/Heme/Allergies: Negative.   Psychiatric/Behavioral: Negative.       Physical Exam:  Vital signs are temperature 98.3.  Pulse 71.  Blood pressure 97/51.  Weight is 147 pounds.    Physical Exam Vitals reviewed.  Constitutional:      Comments: Her breast exam shows right mastectomy.  She has reconstruction on this now.  This is healing.  There is no erythema.  There is no warmth or swelling.  .  There is no right axillary adenopathy.  Left breast is unremarkable.  There is no mass in the left breast.  There is no left axillary adenopathy.     HENT:     Head: Normocephalic and atraumatic.  Eyes:     Pupils: Pupils are equal, round, and reactive to light.  Cardiovascular:     Rate and Rhythm: Normal rate and regular rhythm.     Heart sounds: Normal heart sounds.  Pulmonary:     Effort: Pulmonary effort is normal.     Breath sounds: Normal breath sounds.  Abdominal:     General: Bowel sounds are normal.     Palpations: Abdomen is soft.     Comments: Abdominal exam shows a well-healed reconstruction scar in the pelvic area.  She has a umbilical surgery scar that is healing.  There is no masses.  There is no tenderness.  There is no fluid wave.  Musculoskeletal:        General: No tenderness or deformity. Normal range of motion.     Cervical back: Normal range of motion.     Comments: Extremities shows a well-healed surgical scar in the flexor aspect of the left forearm.  She also has a surgical scar in the left elbow.  These are well-healed.  She has good pulses.  She has somewhat decreased range of motion of the fingers and wrist.    Lymphadenopathy:     Cervical: No cervical adenopathy.  Skin:    General: Skin is warm and dry.     Findings: No erythema or rash.  Neurological:     Mental Status: She is alert and oriented to person, place, and time.  Psychiatric:        Behavior: Behavior normal.        Thought Content: Thought content normal.        Judgment: Judgment normal.     Lab Results  Component Value Date   WBC 8.4 05/04/2023   HGB 15.1 (H) 05/04/2023   HCT 45.3 05/04/2023   MCV 92.8 05/04/2023   PLT 229 05/04/2023     Chemistry      Component Value Date/Time   NA 147 (H) 05/04/2023 0754   NA 140 09/26/2016 0743   NA 143 09/21/2015 0854   K 4.9 05/04/2023 0754   K 3.8 09/26/2016 0743   K 4.4 09/21/2015 0854   CL 108 05/04/2023 0754   CL 104 09/26/2016 0743   CO2 29 05/04/2023 0754   CO2 30 09/26/2016 0743   CO2 28 09/21/2015 0854   BUN 16 05/04/2023 0754   BUN 14 09/26/2016 0743   BUN 13.9  09/21/2015 0854   CREATININE 0.96 05/04/2023 0754   CREATININE 0.87 01/01/2018 1055   CREATININE 0.8 09/21/2015 0854      Component Value Date/Time   CALCIUM 9.9 05/04/2023 0754   CALCIUM 9.4 09/26/2016 0743   CALCIUM 10.1 09/21/2015 0854   ALKPHOS 90 05/04/2023 0754   ALKPHOS 80 09/26/2016 0743   ALKPHOS  98 09/21/2015 0854   AST 16 05/04/2023 0754   AST 37 (H) 09/21/2015 0854   ALT 19 05/04/2023 0754   ALT 35 09/26/2016 0743   ALT 57 (H) 09/21/2015 0854   BILITOT 0.7 05/04/2023 0754   BILITOT 0.53 09/21/2015 0854      Impression and Plan: Rachel [name] is a 58 year old white female with a history of stage I ductal carcinoma of the right breast. She was diagnosed 22 years ago. Her tumor was ER positive. I  think that  she is cured.  Now, she has a second breast cancer.  Again I feel this is not a recurrence in the original breast.  I feel this is a second breast cancer.  Again, she completed her adjuvant chemotherapy.  She went on to [place] to have surgery for the reconstruction.  She did well with this.  Again, the accident that she had in which she broke the left wrist really has caused continued problems for her.  Hopefully, orthopedic surgery will be able to help out and get her back to where she would like to be.  I will plan to see her back myself in another 4 to 5 months.  We will try to get her through the Summer.   Josph Macho, MD 3/24/20258:50 AM

## 2023-05-05 ENCOUNTER — Other Ambulatory Visit: Payer: Self-pay

## 2023-05-05 DIAGNOSIS — E032 Hypothyroidism due to medicaments and other exogenous substances: Secondary | ICD-10-CM

## 2023-05-05 MED ORDER — LEVOTHYROXINE SODIUM 75 MCG PO TABS
75.0000 ug | ORAL_TABLET | Freq: Every day | ORAL | 1 refills | Status: DC
Start: 2023-05-05 — End: 2023-09-07

## 2023-05-06 ENCOUNTER — Other Ambulatory Visit: Payer: Self-pay

## 2023-05-07 ENCOUNTER — Telehealth (HOSPITAL_BASED_OUTPATIENT_CLINIC_OR_DEPARTMENT_OTHER): Payer: Self-pay

## 2023-05-07 NOTE — Telephone Encounter (Signed)
 Patient left voicemail on 05/07/23 to schedule annual exam. Tried to call patient back, and left a voicemail on 05/07/23 @ 3:20pm, on her cell and home numbers.

## 2023-05-14 ENCOUNTER — Ambulatory Visit
Admission: RE | Admit: 2023-05-14 | Discharge: 2023-05-14 | Disposition: A | Discharge status: Home / Self Care | Payer: Self-pay | Source: Ambulatory Visit | Attending: Hematology & Oncology | Admitting: Hematology & Oncology

## 2023-05-14 DIAGNOSIS — Z1231 Encounter for screening mammogram for malignant neoplasm of breast: Secondary | ICD-10-CM

## 2023-05-19 ENCOUNTER — Ambulatory Visit: Payer: Self-pay | Admitting: Physician Assistant

## 2023-05-20 ENCOUNTER — Other Ambulatory Visit: Payer: Self-pay | Admitting: Hematology & Oncology

## 2023-05-20 DIAGNOSIS — R928 Other abnormal and inconclusive findings on diagnostic imaging of breast: Secondary | ICD-10-CM

## 2023-05-21 ENCOUNTER — Other Ambulatory Visit: Payer: Self-pay | Admitting: Hematology & Oncology

## 2023-05-28 ENCOUNTER — Other Ambulatory Visit: Payer: Self-pay | Admitting: Hematology & Oncology

## 2023-05-28 ENCOUNTER — Ambulatory Visit
Admission: RE | Admit: 2023-05-28 | Discharge: 2023-05-28 | Disposition: A | Discharge status: Home / Self Care | Source: Ambulatory Visit | Attending: Hematology & Oncology | Admitting: Hematology & Oncology

## 2023-05-28 DIAGNOSIS — R928 Other abnormal and inconclusive findings on diagnostic imaging of breast: Secondary | ICD-10-CM

## 2023-05-28 DIAGNOSIS — N6320 Unspecified lump in the left breast, unspecified quadrant: Secondary | ICD-10-CM

## 2023-06-01 ENCOUNTER — Encounter

## 2023-06-01 ENCOUNTER — Other Ambulatory Visit

## 2023-06-02 ENCOUNTER — Ambulatory Visit
Admission: RE | Admit: 2023-06-02 | Discharge: 2023-06-02 | Disposition: A | Discharge status: Home / Self Care | Source: Ambulatory Visit | Attending: Hematology & Oncology | Admitting: Hematology & Oncology

## 2023-06-02 DIAGNOSIS — N6320 Unspecified lump in the left breast, unspecified quadrant: Secondary | ICD-10-CM

## 2023-06-02 HISTORY — PX: BREAST BIOPSY: SHX20

## 2023-06-04 ENCOUNTER — Other Ambulatory Visit: Payer: Self-pay | Admitting: Hematology & Oncology

## 2023-06-04 DIAGNOSIS — N6489 Other specified disorders of breast: Secondary | ICD-10-CM

## 2023-06-04 LAB — SURGICAL PATHOLOGY

## 2023-06-11 ENCOUNTER — Encounter: Payer: Self-pay | Admitting: Hematology & Oncology

## 2023-06-18 ENCOUNTER — Other Ambulatory Visit: Payer: Self-pay | Admitting: Hematology & Oncology

## 2023-06-18 ENCOUNTER — Ambulatory Visit
Admission: RE | Admit: 2023-06-18 | Discharge: 2023-06-18 | Disposition: A | Discharge status: Home / Self Care | Source: Ambulatory Visit | Attending: Hematology & Oncology | Admitting: Hematology & Oncology

## 2023-06-18 ENCOUNTER — Encounter

## 2023-06-18 DIAGNOSIS — N6489 Other specified disorders of breast: Secondary | ICD-10-CM

## 2023-06-24 ENCOUNTER — Ambulatory Visit
Admission: RE | Admit: 2023-06-24 | Discharge: 2023-06-24 | Disposition: A | Discharge status: Home / Self Care | Source: Ambulatory Visit | Attending: Hematology & Oncology | Admitting: Hematology & Oncology

## 2023-06-24 DIAGNOSIS — N6489 Other specified disorders of breast: Secondary | ICD-10-CM

## 2023-06-24 HISTORY — PX: BREAST BIOPSY: SHX20

## 2023-06-25 LAB — SURGICAL PATHOLOGY

## 2023-06-26 ENCOUNTER — Encounter: Payer: Self-pay | Admitting: Hematology & Oncology

## 2023-07-01 ENCOUNTER — Encounter: Payer: Self-pay | Admitting: Gastroenterology

## 2023-07-01 ENCOUNTER — Other Ambulatory Visit: Payer: Self-pay

## 2023-07-01 ENCOUNTER — Ambulatory Visit (INDEPENDENT_AMBULATORY_CARE_PROVIDER_SITE_OTHER): Payer: Self-pay | Admitting: Gastroenterology

## 2023-07-01 VITALS — BP 120/74 | HR 71 | Ht 64.0 in | Wt 149.0 lb

## 2023-07-01 DIAGNOSIS — R1319 Other dysphagia: Secondary | ICD-10-CM

## 2023-07-01 DIAGNOSIS — R110 Nausea: Secondary | ICD-10-CM | POA: Diagnosis not present

## 2023-07-01 DIAGNOSIS — R1310 Dysphagia, unspecified: Secondary | ICD-10-CM | POA: Diagnosis not present

## 2023-07-01 DIAGNOSIS — Z860100 Personal history of colon polyps, unspecified: Secondary | ICD-10-CM

## 2023-07-01 MED ORDER — NA SULFATE-K SULFATE-MG SULF 17.5-3.13-1.6 GM/177ML PO SOLN: 1.0000 | Freq: Once | ORAL | 0 refills | Status: AC

## 2023-07-01 NOTE — Patient Instructions (Signed)
 We have sent the following medications to your pharmacy for you to pick up at your convenience: Suprep  You have been scheduled for an endoscopy and colonoscopy. Please follow the written instructions given to you at your visit today.  If you use inhalers (even only as needed), please bring them with you on the day of your procedure.  DO NOT TAKE 7 DAYS PRIOR TO TEST- Trulicity (dulaglutide) Ozempic, Wegovy (semaglutide) Mounjaro (tirzepatide) Bydureon Bcise (exanatide extended release)  DO NOT TAKE 1 DAY PRIOR TO YOUR TEST Rybelsus (semaglutide) Adlyxin (lixisenatide) Victoza (liraglutide) Byetta (exanatide) ___________________________________________________________________________  _______________________________________________________  If your blood pressure at your visit was 140/90 or greater, please contact your primary care physician to follow up on this.  _______________________________________________________  If you are age 57 or older, your body mass index should be between 23-30. Your Body mass index is 25.58 kg/m. If this is out of the aforementioned range listed, please consider follow up with your Primary Care Provider.  If you are age 70 or younger, your body mass index should be between 19-25. Your Body mass index is 25.58 kg/m. If this is out of the aformentioned range listed, please consider follow up with your Primary Care Provider.   ________________________________________________________  The [facility] providers would like to encourage you to use MYCHART to communicate with providers for non-urgent requests or questions.  Due to long hold times on the telephone, sending your provider a message by [facility] may be a faster and more efficient way to get a response.  Please allow 48 business hours for a response.  Please remember that this is for non-urgent requests.  _______________________________________________________

## 2023-07-01 NOTE — Progress Notes (Signed)
 [facility] Consult Note:  History: Rachel [name] [facility] 07/01/2023  Referring provider: Austine Lefort, MD  Reason for consult/chief complaint: Dysphagia (Patient is here to discuss Egd. Patient states she has trouble swallowing solid foods.)   Subjective  Prior history: Colonoscopy January 2020 -3 subcentimeter tubular adenomas EGD January 2020 for dysphagia-Schatzki ring, tissue opened with biopsy forceps EGD September 2021 for dysphagia-53 Rachel [name] dilation of Schatzki ring EGD August 2023 for dysphagia/odynophagia beginning after her first cycle of chemotherapy for breast cancer and waxing and waning during treatment.  40 French Maloney dilation did not break up in the Schatzki ring ring   _______________  History of Present Illness     Rachel [name] was here today primarily for recurrence of dysphagia.  She reports improvement after the endoscopic dilations were performed, but the dysphagia recurs at some point.  She is also being experiencing increasing symptoms of reflux with heartburn and regurgitation, including nocturnal symptoms.  She has not felt the need for regular use of acid suppression because the symptoms do not happen daily. She has also been prone to bouts of nausea ever since her breast cancer surgery.  She brought up with oncologist the possibility of this may be due to her aromatase inhibitor but they did not apparently feel that was the case. She does not vomit undigested food and those nausea episodes occur intermittently. Bowel habits are regular without rectal bleeding.  She has been bothered by about 10 pound weight gain in the last several months  ROS:  Review of Systems  Constitutional:  Positive for fatigue and unexpected weight change. Negative for appetite change.  HENT:  Negative for mouth sores and voice change.   Eyes:  Negative for pain and redness.  Respiratory:  Negative for cough and shortness of breath.   Cardiovascular:   Negative for chest pain and palpitations.  Genitourinary:  Negative for dysuria and hematuria.  Musculoskeletal:  Negative for arthralgias and myalgias.  Skin:  Negative for pallor and rash.  Neurological:  Negative for weakness and headaches.  Hematological:  Negative for adenopathy.     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  Prior T score -2.5.  DEXA stable from prior study.   Personal history of chemotherapy 2001   Personal history of radiation therapy 2001   Pneumonia    Had 12-2017   PONV (postoperative nausea and vomiting)      Past Surgical History: Past Surgical History:  Procedure Laterality Date   BREAST BIOPSY Left 06/02/2023   US  LT BREAST BX W LOC DEV EA ADD LESION IMG BX SPEC US  GUIDE 06/02/2023 [facility]   BREAST BIOPSY Left 06/02/2023   US  LT BREAST BX W LOC DEV 1ST LESION IMG BX SPEC US  GUIDE 06/02/2023 [facility]   BREAST BIOPSY Left 06/24/2023   MM LT BREAST BX W LOC DEV 1ST LESION IMAGE BX SPEC STEREO GUIDE 06/24/2023 [facility]   BREAST EXCISIONAL  BIOPSY Left 2021   BREAST LUMPECTOMY Right 2001   CESAREAN SECTION  1991   COLONOSCOPY     MASTECTOMY W/ SENTINEL NODE BIOPSY Right 06/25/2021   Procedure: RIGHT MASTECTOMY WITH SENTINEL LYMPH NODE BIOPSY;  Surgeon: Lockie Rima, MD;  Location: [facility];  Service: General;  Laterality: Right;   NASAL SINUS SURGERY Right 02/20/2020   Procedure: ENDOSCOPIC SINUS SURGERY WITH RIGHT ETHMOIDECTOMY AND RIGHT MAXILLARY OSTIA  ENLARGEMENT WITH FUSION;  Surgeon: Prescott Brodie, MD;  Location: [facility];  Service: ENT;  Laterality: Right;   OPEN REDUCTION INTERNAL FIXATION (ORIF) DISTAL RADIAL FRACTURE Left 10/21/2022   Procedure: OPEN REDUCTION INTERNAL FIXATION (ORIF) DISTAL RADIUS FRACTURE;  Surgeon: Ltanya Rummer, MD;  Location: [facility];  Service: Orthopedics;  Laterality: Left;   PELVIC LAPAROSCOPY     endometriosis   PORTACATH PLACEMENT N/A 08/07/2021   Procedure: PORT PLACEMENT WITH ULTRASOUND GUIDANCE;  Surgeon: Lockie Rima, MD;  Location: [facility];  Service: General;  Laterality: N/A;   SINUS ENDO WITH FUSION Right 02/20/2020   Procedure: SINUS ENDO WITH FUSION;  Surgeon: Prescott Brodie, MD;  Location: [facility];  Service: ENT;  Laterality: Right;   TONSILLECTOMY  1980   UPPER GI ENDOSCOPY     WISDOM TOOTH EXTRACTION  1986     Family History: Family History  Problem Relation Age of Onset   Diabetes Father    Uterine cancer Maternal Aunt 60   Colon polyps Neg Hx    Colon cancer Neg Hx    Esophageal cancer Neg Hx    Stomach cancer Neg Hx    Rectal cancer Neg Hx     Social History: Social History   Socioeconomic History   Marital status: Married    Spouse name: Not on file   Number of children: Not on file   Years of education: Not on file   Highest education level: Master's degree (e.g., MA, MS, MEng, MEd, MSW, MBA)  Occupational History   Not on file  Tobacco Use   Smoking status: Never   Smokeless tobacco: Never   Tobacco comments:    never used tobacco  Vaping Use   Vaping status: Never Used  Substance and Sexual Activity   Alcohol use: Not Currently    Alcohol/week: 0.0 standard drinks of alcohol   Drug use: No   Sexual activity: Yes    Birth control/protection: Post-menopausal    Comment: 1st intercourse 58 yo-Fewer than 5 partners  Other Topics Concern   Not on file  Social History Narrative   Not on file    Social Drivers of Health   Financial Resource Strain: Patient Declined (02/09/2023)   Overall Financial Resource Strain (CARDIA)    Difficulty of Paying Living Expenses: Patient declined  Food Insecurity: Patient Declined (02/09/2023)   Hunger Vital Sign    Worried About Running Out of Food in the Last Year: Patient declined    Ran Out of Food in the Last Year: Patient declined  Transportation Needs: Patient Declined (02/09/2023)   PRAPARE - Administrator, Civil Service (Medical): Patient declined    Lack of Transportation (Non-Medical): Patient declined  Physical Activity: Insufficiently Active (02/09/2023)   Exercise Vital Sign    Days of Exercise per Week: 3 days    Minutes of Exercise per Session: 10 min  Stress: No Stress Concern Present (02/09/2023)   [employer] of Occupational Health - Occupational Stress Questionnaire  Feeling of Stress : Not at all  Social Connections: Unknown (02/09/2023)   Social Connection and Isolation Panel [NHANES]    Frequency of Communication with Friends and Family: Patient declined    Frequency of Social Gatherings with Friends and Family: Patient declined    Attends Religious Services: Patient declined    Database administrator or Organizations: Patient declined    Attends Banker Meetings: Not on file    Marital Status: Married    Allergies: Allergies  Allergen Reactions   Anesthetics, Ester Nausea And Vomiting   Erythromycin Nausea And Vomiting    Outpatient Meds: Current Outpatient Medications  Medication Sig Dispense Refill   acetaminophen  (TYLENOL ) 500 MG tablet Take 1,000 mg by mouth every 6 (six) hours as needed for moderate pain or mild pain.     butalbital -acetaminophen -caffeine  (FIORICET, ESGIC) 50-325-40 MG per tablet TAKE 1 TABLET BY MOUTH EVERY 6 HOURS AS NEEDED FOR HEADACHE 20 tablet 0   Cholecalciferol (VITAMIN D  PO) Take 1,000 Units by mouth daily.     exemestane  (AROMASIN ) 25 MG  tablet TAKE 1 TABLET (25 MG TOTAL) BY MOUTH DAILY AFTER BREAKFAST. 90 tablet 4   Fezolinetant  (VEOZAH ) 45 MG TABS Take 1 tablet (45 mg total) by mouth daily. 30 tablet 2   fluticasone  (FLONASE ) 50 MCG/ACT nasal spray INHALE 2 SPRASY IN EACH NOSTRIL ONCE DAILY 16 g 11   ibuprofen  (ADVIL ) 600 MG tablet Take 600 mg by mouth every 8 (eight) hours.     levothyroxine  (SYNTHROID ) 75 MCG tablet Take 1 tablet (75 mcg total) by mouth daily before breakfast. 90 tablet 1   Na Sulfate-K Sulfate-Mg Sulfate concentrate (SUPREP) 17.5-3.13-1.6 GM/177ML SOLN Take 1 kit (354 mLs total) by mouth once for 1 dose. 354 mL 0   pantoprazole  (PROTONIX ) 40 MG tablet Take 1 tablet (40 mg total) by mouth daily. 90 tablet 3   promethazine  (PHENERGAN ) 25 MG tablet Take 1 tablet (25 mg total) by mouth every 8 (eight) hours as needed for nausea or vomiting. 30 tablet 0   rizatriptan  (MAXALT ) 10 MG tablet TAKE 1 TABLET BY MOUTH EVERY DAY AS NEEDED FOR MIGRANE 6 tablet 5   Current Facility-Administered Medications  Medication Dose Route Frequency Provider Last Rate Last Admin   ondansetron  (ZOFRAN ) injection 8 mg  8 mg Intravenous Once Ennever, Peter R, MD          ___________________________________________________________________ Objective   Exam:  BP 120/74   Pulse 71   Ht 5\' 4"  (1.626 m)   Wt 149 lb (67.6 kg)   LMP  (LMP Unknown)   BMI 25.58 kg/m  Wt Readings from Last 3 Encounters:  07/01/23 149 lb (67.6 kg)  05/04/23 147 lb (66.7 kg)  02/09/23 147 lb 3 oz (66.8 kg)    General: Well-appearing, normal vocal quality Eyes: sclera anicteric, no redness ENT: oral mucosa moist without lesions, no cervical or supraclavicular lymphadenopathy CV: Regular without appreciable murmur, no JVD, no peripheral edema Resp: clear to auscultation bilaterally, normal RR and effort noted GI: soft, no tenderness, with active bowel sounds. No guarding or palpable organomegaly noted. Skin; warm and dry, no rash or jaundice  noted Neuro: awake, alert and oriented x 3. Normal gross motor function and fluent speech   Labs:     Latest Ref Rng & Units 05/04/2023    7:54 AM 01/26/2023    8:44 AM 12/15/2022    7:56 AM  CBC  WBC 4.0 - 10.5 K/uL 8.4  5.9  6.3   Hemoglobin 12.0 - 15.0 g/dL 14.7  82.9  56.2   Hematocrit 36.0 - 46.0 % 45.3  42.2  43.4   Platelets 150 - 400 K/uL 229  229  205       Latest Ref Rng & Units 05/04/2023    7:54 AM 01/26/2023    8:44 AM 12/15/2022    7:56 AM  CMP  Glucose 70 - 99 mg/dL 99  130  865   BUN 6 - 20 mg/dL 16  21  18    Creatinine 0.44 - 1.00 mg/dL 7.84  6.96  2.95   Sodium 135 - 145 mmol/L 147  142  142   Potassium 3.5 - 5.1 mmol/L 4.9  4.6  4.9   Chloride 98 - 111 mmol/L 108  107  106   CO2 22 - 32 mmol/L 29  29  29    Calcium  8.9 - 10.3 mg/dL 9.9  9.7  28.4   Total Protein 6.5 - 8.1 g/dL 6.7  6.7  7.1   Total Bilirubin 0.0 - 1.2 mg/dL 0.7  0.7  0.5   Alkaline Phos 38 - 126 U/L 90  88  98   AST 15 - 41 U/L 16  16  16    ALT 0 - 44 U/L 19  17  14        Encounter Diagnoses  Name Primary?   Esophageal dysphagia Yes   Nausea in adult    Hx of colonic polyps     Assessment and Plan Assessment & Plan   Recurrence of esophageal dysphagia in the setting of reflux symptoms and known history of Schatzki ring.  Probably recurrence of ring or stricture.  Upper endoscopy warranted for endoscopic dilation.  With this, we will also see if there is esophagitis to suggest the need for regular use of acid suppression therapy or other antireflux treatments.  Intermittent nausea, does not sound like gastroparesis without vomiting.  Possible long-term effect of her cancer treatment or other dysmotility.  History of colon polyps, due for surveillance.  Colonoscopy warranted and she was agreeable after discussion of procedure and risks.  The benefits and risks of the planned procedure(s) were described in detail with the patient or (when appropriate) their health care proxy.   Risks were outlined as including, but not limited to, bleeding, infection, perforation, adverse medication reaction leading to cardiac or pulmonary decompensation, pancreatitis (if ERCP).  The limitation of incomplete mucosal visualization was also discussed.  No guarantees or warranties were given.   Thank you for the courtesy of this consult.  Please call me with any questions or concerns.  Kerby Pearson III  CC: Referring provider noted above

## 2023-07-23 ENCOUNTER — Other Ambulatory Visit (HOSPITAL_BASED_OUTPATIENT_CLINIC_OR_DEPARTMENT_OTHER): Payer: Self-pay | Admitting: Obstetrics & Gynecology

## 2023-07-23 MED ORDER — VEOZAH 45 MG PO TABS: 1.0000 | ORAL_TABLET | Freq: Every day | ORAL | 0 refills | Status: DC

## 2023-07-23 NOTE — Telephone Encounter (Signed)
 Tried calling patient to ask about blood work that needs to be drawn for medication review. Voicemail was full. tbw

## 2023-07-28 ENCOUNTER — Encounter (HOSPITAL_BASED_OUTPATIENT_CLINIC_OR_DEPARTMENT_OTHER): Payer: Self-pay | Admitting: Obstetrics & Gynecology

## 2023-07-28 ENCOUNTER — Ambulatory Visit (HOSPITAL_BASED_OUTPATIENT_CLINIC_OR_DEPARTMENT_OTHER): Admitting: Obstetrics & Gynecology

## 2023-07-28 VITALS — BP 120/57 | HR 64 | Ht 63.5 in | Wt 147.4 lb

## 2023-07-28 DIAGNOSIS — C50611 Malignant neoplasm of axillary tail of right female breast: Secondary | ICD-10-CM

## 2023-07-28 DIAGNOSIS — R232 Flushing: Secondary | ICD-10-CM

## 2023-07-28 DIAGNOSIS — Z9011 Acquired absence of right breast and nipple: Secondary | ICD-10-CM

## 2023-07-28 DIAGNOSIS — Z01419 Encounter for gynecological examination (general) (routine) without abnormal findings: Secondary | ICD-10-CM | POA: Diagnosis not present

## 2023-07-28 DIAGNOSIS — Z171 Estrogen receptor negative status [ER-]: Secondary | ICD-10-CM

## 2023-07-28 DIAGNOSIS — M85852 Other specified disorders of bone density and structure, left thigh: Secondary | ICD-10-CM | POA: Diagnosis not present

## 2023-07-28 MED ORDER — VEOZAH 45 MG PO TABS: 1.0000 | ORAL_TABLET | Freq: Every day | ORAL | 3 refills | Status: DC

## 2023-07-28 NOTE — Patient Instructions (Signed)
Call [phone] to schedule an appointment at [facility].

## 2023-07-28 NOTE — Progress Notes (Signed)
 ANNUAL EXAM Patient name: Rachel [name] MRN [id number]  Date of birth: Feb 18, 1965 Chief Complaint:   annual exam  History of Present Illness:   Rachel [name] is a 58 y.o. G57P1102 Caucasian female being seen today for a routine annual exam. H/o breast cancer, most recently s/p mastectomy 06/25/2021.  Treated with chemotherapy.  Then on Aromasin .  Then had right breast reconstruction with DIEP flap 12/17/2021.  Followed by Dr. Timmy.  Had distal radius fracture 10/2022 requiring surgery.    Family is doing well.  Going to beach with entire family and grandchildren.  Looking forward to this.    On [place]  and this has helped her hot flashes.  Desires to continue.  Has had follow up liver enzymes.     No LMP recorded (lmp unknown). Patient is postmenopausal.   Last pap 05/20/2022 Negative. Results were: NILM w/ HRHPV not done. H/O abnormal pap: yes Last mammogram: 06/18/2023. Results were: normal. Family h/o breast cancer: no Last colonoscopy: 02/23/2018. Results were: adenomatous polyps.  Follow up 5 years.  Scheduled Friday.       07/28/2023    3:48 PM 02/09/2023   11:10 AM 10/06/2022   12:37 PM 05/20/2022    3:30 PM 01/09/2022   10:02 AM  Depression screen PHQ 2/9  Decreased Interest 0 0 0 0 0  Down, Depressed, Hopeless 0 0 0 0 0  PHQ - 2 Score 0 0 0 0 0  Altered sleeping     0  Tired, decreased energy     0  Change in appetite     0  Feeling bad or failure about yourself      0  Trouble concentrating     0  Moving slowly or fidgety/restless     0  Suicidal thoughts     0  PHQ-9 Score     0  Difficult doing work/chores     Not difficult at all        01/09/2022   10:03 AM  GAD 7 : Generalized Anxiety Score  Nervous, Anxious, on Edge 0  Control/stop worrying 0  Worry too much - different things 0  Trouble relaxing 0  Restless 0  Easily annoyed or irritable 0  Afraid - awful might happen 0  Total GAD 7 Score 0  Anxiety Difficulty Not difficult at all     Review of  Systems:   Pertinent items are noted in HPI  Denies any urinary or bowel issues.  No pelvic pain. Pertinent History Reviewed:  Reviewed past medical,surgical, social and family history.  Reviewed problem list, medications and allergies. Physical Assessment:   Vitals:   07/28/23 1547  BP: (!) 120/57  Pulse: 64  Weight: 147 lb 6.4 oz (66.9 kg)  Height: 5' 3.5 (1.613 m)  Body mass index is 25.7 kg/m.        Physical Examination:   General appearance - well appearing, and in no distress  Mental status - alert, oriented to person, place, and time  Psych:  She has a normal mood and affect  Skin - warm and dry, normal color, no suspicious lesions noted  Chest - effort normal, all lung fields clear to auscultation bilaterally  Heart - normal rate and regular rhythm  Neck:  midline trachea, no thyromegaly or nodules  Breasts - well healed right reconstruction.  No masses, skin changes, or LAD  Abdomen - soft, nontender, nondistended, no masses or organomegaly  Pelvic - VULVA: normal appearing vulva with  no masses, tenderness or lesions   VAGINA: normal appearing vagina with normal color and discharge, no lesions   CERVIX: normal appearing cervix without discharge or lesions, no CMT  Thin prep pap is not indicated  UTERUS: uterus is felt to be normal size, shape, consistency and nontender   ADNEXA: No adnexal masses or tenderness noted.  Rectal - normal rectal, good sphincter tone, no masses felt.   Extremities:  No swelling or varicosities noted  Chaperone present for exam  Assessment & Plan:  1. Well woman exam with routine gynecological exam (Primary) - Pap smear 05/2022.  Not indicated today. - Mammogram 06/18/2023 - Colonoscopy 02/23/2018 - Bone mineral density ordered - lab work done with PCP, Dr. Duanne and Dr. Timmy - vaccines reviewed/updated  2. Hot flashes - Fezolinetant  (VEOZAH ) 45 MG TABS; Take 1 tablet (45 mg total) by mouth daily.  Dispense: 90 tablet; Refill:  3  3. Osteopenia of neck of left femur - DG Bone Density; Future  4. S/P right mastectomy  5. 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    Dispense:  90 tablet    Refill:  3    Follow-up: Return in about 1 year (around 07/27/2024).  Ronal GORMAN Pinal, MD 07/30/2023 7:36 PM

## 2023-07-29 ENCOUNTER — Other Ambulatory Visit: Payer: Self-pay

## 2023-07-30 ENCOUNTER — Encounter (HOSPITAL_BASED_OUTPATIENT_CLINIC_OR_DEPARTMENT_OTHER): Payer: Self-pay | Admitting: Obstetrics & Gynecology

## 2023-07-31 ENCOUNTER — Ambulatory Visit: Admitting: Gastroenterology

## 2023-07-31 ENCOUNTER — Encounter: Payer: Self-pay | Admitting: Gastroenterology

## 2023-07-31 VITALS — BP 122/82 | HR 72 | Temp 98.0°F | Resp 17 | Ht 64.0 in | Wt 149.0 lb

## 2023-07-31 DIAGNOSIS — R1310 Dysphagia, unspecified: Secondary | ICD-10-CM | POA: Diagnosis not present

## 2023-07-31 DIAGNOSIS — Z1211 Encounter for screening for malignant neoplasm of colon: Secondary | ICD-10-CM | POA: Diagnosis present

## 2023-07-31 DIAGNOSIS — K5730 Diverticulosis of large intestine without perforation or abscess without bleeding: Secondary | ICD-10-CM | POA: Diagnosis not present

## 2023-07-31 DIAGNOSIS — Z860101 Personal history of adenomatous and serrated colon polyps: Secondary | ICD-10-CM

## 2023-07-31 DIAGNOSIS — D122 Benign neoplasm of ascending colon: Secondary | ICD-10-CM

## 2023-07-31 DIAGNOSIS — R1319 Other dysphagia: Secondary | ICD-10-CM

## 2023-07-31 DIAGNOSIS — K222 Esophageal obstruction: Secondary | ICD-10-CM

## 2023-07-31 DIAGNOSIS — Z860100 Personal history of colon polyps, unspecified: Secondary | ICD-10-CM

## 2023-07-31 MED ORDER — SODIUM CHLORIDE 0.9 % IV SOLN: 500.0000 mL | Freq: Once | INTRAVENOUS | Status: DC

## 2023-07-31 NOTE — Patient Instructions (Addendum)
Resume previous diet Continue present medications Await pathology results Handouts/information given for polyps and diverticulosis.  YOU HAD AN ENDOSCOPIC PROCEDURE TODAY AT THE [facility]:   Refer to the procedure report that was given to you for any specific questions about what was found during the examination.  If the procedure report does not answer your questions, please call your gastroenterologist to clarify.  If you requested that your care partner not be given the details of your procedure findings, then the procedure report has been included in a sealed envelope for you to review at your convenience later.  YOU SHOULD EXPECT: Some feelings of bloating in the abdomen. Passage of more gas than usual.  Walking can help get rid of the air that was put into your GI tract during the procedure and reduce the bloating. If you had a lower endoscopy (such as a colonoscopy or flexible sigmoidoscopy) you may notice spotting of blood in your stool or on the toilet paper. If you underwent a bowel prep for your procedure, you may not have a normal bowel movement for a few days. Please Note:  You might notice some irritation and congestion in your nose or some drainage.  This is from the oxygen used during your procedure.  There is no need for concern and it should clear up in a day or so.  SYMPTOMS TO REPORT IMMEDIATELY:  Following lower endoscopy (colonoscopy):  Excessive amounts of blood in the stool  Significant tenderness or worsening of abdominal pains  Swelling of the abdomen that is new, acute  Fever of 100F or higher Following upper endoscopy (EGD)  Vomiting of blood or coffee ground material  New chest pain or pain under the shoulder blades  Painful or persistently difficult swallowing  New shortness of breath  Black, tarry-looking stools  For urgent or emergent issues, a gastroenterologist can be reached at any hour by calling [phone]. Do not use MyChart messaging  for urgent concerns.   DIET:  We do recommend a small meal at first, but then you may proceed to your regular diet.  Drink plenty of fluids but you should avoid alcoholic beverages for 24 hours.  ACTIVITY:  You should plan to take it easy for the rest of today and you should NOT DRIVE or use heavy machinery until tomorrow (because of the sedation medicines used during the test).    FOLLOW UP: Our staff will call the number listed on your records the next business day following your procedure.  We will call around 7:15- 8:00 am to check on you and address any questions or concerns that you may have regarding the information given to you following your procedure. If we do not reach you, we will leave a message.     If any biopsies were taken you will be contacted by phone or by letter within the next 1-3 weeks.  Please call us at [phone] if you have not heard about the biopsies in 3 weeks.    SIGNATURES/CONFIDENTIALITY: You and/or your care partner have signed paperwork which will be entered into your electronic medical record.  These signatures attest to the fact that that the information above on your After Visit Summary has been reviewed and is understood.  Full responsibility of the confidentiality of this discharge information lies with you and/or your care-partner.

## 2023-07-31 NOTE — Progress Notes (Signed)
 Called to room to assist during endoscopic procedure.  Patient ID and intended procedure confirmed with present staff. Received instructions for my participation in the procedure from the performing physician.

## 2023-07-31 NOTE — Op Note (Signed)
 [facility] Patient Name: Rachel [name] Procedure Date: 07/31/2023 1:26 PM MRN: [id number] Endoscopist: Ace Abu L. Dominic Friendly , MD, [id number] Age: 58 Referring MD:  Date of Birth: 01/16/1966 Gender: Female Account #: [id number] Procedure:                Colonoscopy Indications:              Surveillance: Personal history of adenomatous                            polyps on last colonoscopy > 5 years ago                           3 subcentimeter tubular adenomas January 2020 Medicines:                Monitored Anesthesia Care Procedure:                Pre-Anesthesia Assessment:                           - Prior to the procedure, a History and Physical                            was performed, and patient medications and                            allergies were reviewed. The patient's tolerance of                            previous anesthesia was also reviewed. The risks                            and benefits of the procedure and the sedation                            options and risks were discussed with the patient.                            All questions were answered, and informed consent                            was obtained. Prior Anticoagulants: The patient has                            taken no anticoagulant or antiplatelet agents. ASA                            Grade Assessment: II - A patient with mild systemic                            disease. After reviewing the risks and benefits,                            the patient was deemed in satisfactory condition to  undergo the procedure.                           After obtaining informed consent, the colonoscope                            was passed under direct vision. Throughout the                            procedure, the patient's blood pressure, pulse, and                            oxygen saturations were monitored continuously. The                            Colonoscope was introduced  through the anus and                            advanced to the the terminal ileum, with                            identification of the appendiceal orifice and IC                            valve. The colonoscopy was performed without                            difficulty. The patient tolerated the procedure                            well. The quality of the bowel preparation was good                            after additional lavage of bilious fluid. The                            terminal ileum, ileocecal valve, appendiceal                            orifice, and rectum were photographed. The bowel                            preparation used was SUPREP. Scope In: 2:01:25 PM Scope Out: 2:12:58 PM Scope Withdrawal Time: 0 hours 9 minutes 12 seconds  Total Procedure Duration: 0 hours 11 minutes 33 seconds  Findings:                 The perianal and digital rectal examinations were                            normal.                           The terminal ileum appeared normal.  Repeat examination of right colon under NBI                            performed.                           Two sessile polyps were found in the ascending                            colon. The polyps were diminutive in size. These                            polyps were removed with a cold snare. Resection                            and retrieval were complete.                           Diverticula were found in the left colon.                           The exam was otherwise without abnormality on                            direct and retroflexion views. Complications:            No immediate complications. Estimated Blood Loss:     Estimated blood loss was minimal. Impression:               - The examined portion of the ileum was normal.                           - Two diminutive polyps in the ascending colon,                            removed with a cold snare. Resected and  retrieved.                           - Diverticulosis in the left colon.                           - The examination was otherwise normal on direct                            and retroflexion views. Recommendation:           - Patient has a contact number available for                            emergencies. The signs and symptoms of potential                            delayed complications were discussed with the                            patient. Return to  normal activities tomorrow.                            Written discharge instructions were provided to the                            patient.                           - Resume previous diet.                           - Continue present medications.                           - Await pathology results.                           - Repeat colonoscopy is recommended for                            surveillance. The colonoscopy date will be                            determined after pathology results from today's                            exam become available for review. Romello Hoehn L. Dominic Friendly, MD 07/31/2023 2:23:33 PM This report has been signed electronically.

## 2023-07-31 NOTE — Progress Notes (Signed)
 No significant changes to clinical history since GI office visit on 07/01/23.  Prior history: Colonoscopy January 2020 -3 subcentimeter tubular adenomas EGD January 2020 for dysphagia-Schatzki ring, tissue opened with biopsy forceps EGD September 2021 for dysphagia-52 [name] dilation of Schatzki ring EGD August 2023 for dysphagia/odynophagia beginning after her first cycle of chemotherapy for breast cancer and waxing and waning during treatment.  52 French Maloney dilation did not break up in the Schatzki ring ring  The patient is appropriate for an endoscopic procedure in the ambulatory setting.  - Lorella Roles, MD

## 2023-07-31 NOTE — Op Note (Signed)
 [facility] Patient Name: Rachel [name] Procedure Date: 07/31/2023 1:27 PM MRN: [id number] Endoscopist: Ace Abu L. Dominic Friendly , MD, [id number] Age: 58 Referring MD:  Date of Birth: [birth year] Gender: Female Account #: [id number] Procedure:                Upper GI endoscopy Indications:              Esophageal dysphagia                           Hx of Schatzki ring with multiple prior dilations Medicines:                Monitored Anesthesia Care Procedure:                Pre-Anesthesia Assessment:                           - Prior to the procedure, a History and Physical                            was performed, and patient medications and                            allergies were reviewed. The patient's tolerance of                            previous anesthesia was also reviewed. The risks                            and benefits of the procedure and the sedation                            options and risks were discussed with the patient.                            All questions were answered, and informed consent                            was obtained. Prior Anticoagulants: The patient has                            taken no anticoagulant or antiplatelet agents. ASA                            Grade Assessment: II - A patient with mild systemic                            disease. After reviewing the risks and benefits,                            the patient was deemed in satisfactory condition to                            undergo the procedure.  After obtaining informed consent, the endoscope was                            passed under direct vision. Throughout the                            procedure, the patient's blood pressure, pulse, and                            oxygen saturations were monitored continuously. The                            Endoscope was introduced through the mouth, and                            advanced to the second part of  duodenum. The upper                            GI endoscopy was accomplished without difficulty.                            The patient tolerated the procedure well. Scope In: Scope Out: Findings:                 A low-grade of narrowing Schatzki ring was found at                            the gastroesophageal junction. A guidewire was                            placed and the scope was withdrawn. Dilation was                            performed with a Savary dilator with mild                            resistance at 17 mm. The dilation site was examined                            and showed no change.                           The stomach was normal.                           The cardia and gastric fundus were normal on                            retroflexion.                           The examined duodenum was normal. Complications:            No immediate complications. Estimated Blood Loss:     Estimated blood loss: none. Impression:               -  Low-grade of narrowing Schatzki ring. Dilated.                           - Normal stomach.                           - Normal examined duodenum.                           - No specimens collected. Recommendation:           - Patient has a contact number available for                            emergencies. The signs and symptoms of potential                            delayed complications were discussed with the                            patient. Return to normal activities tomorrow.                            Written discharge instructions were provided to the                            patient.                           - Resume previous diet.                           - Continue present medications.                           - See the other procedure note for documentation of                            additional recommendations. Shonn Farruggia L. Dominic Friendly, MD 07/31/2023 2:19:44 PM This report has been signed electronically.

## 2023-07-31 NOTE — Progress Notes (Signed)
 Pt's states no medical or surgical changes since previsit or office visit.

## 2023-07-31 NOTE — Progress Notes (Signed)
 Pt sedate, gd SR's, VSS, report to RN

## 2023-08-03 ENCOUNTER — Telehealth: Payer: Self-pay | Admitting: *Deleted

## 2023-08-03 NOTE — Telephone Encounter (Signed)
  Follow up Call-     07/31/2023    1:17 PM 10/03/2021    1:50 PM  Call back number  Post procedure Call Back phone  # [phone]  Permission to leave phone message Yes Yes    Post procedure follow up phone call. No answer at number given.  Left message on voicemail.

## 2023-08-05 LAB — SURGICAL PATHOLOGY

## 2023-08-06 ENCOUNTER — Ambulatory Visit: Payer: Self-pay | Admitting: Gastroenterology

## 2023-08-11 ENCOUNTER — Ambulatory Visit (HOSPITAL_BASED_OUTPATIENT_CLINIC_OR_DEPARTMENT_OTHER)
Admission: RE | Admit: 2023-08-11 | Discharge: 2023-08-11 | Disposition: A | Discharge status: Home / Self Care | Source: Ambulatory Visit | Attending: Obstetrics & Gynecology | Admitting: Obstetrics & Gynecology

## 2023-08-11 DIAGNOSIS — M85852 Other specified disorders of bone density and structure, left thigh: Secondary | ICD-10-CM | POA: Diagnosis present

## 2023-08-12 ENCOUNTER — Ambulatory Visit (HOSPITAL_BASED_OUTPATIENT_CLINIC_OR_DEPARTMENT_OTHER): Payer: Self-pay | Admitting: Obstetrics & Gynecology

## 2023-09-05 ENCOUNTER — Other Ambulatory Visit: Payer: Self-pay | Admitting: Hematology & Oncology

## 2023-09-05 DIAGNOSIS — E032 Hypothyroidism due to medicaments and other exogenous substances: Secondary | ICD-10-CM

## 2023-09-07 ENCOUNTER — Encounter: Payer: Self-pay | Admitting: Hematology & Oncology

## 2023-10-07 ENCOUNTER — Other Ambulatory Visit: Payer: Self-pay | Admitting: Family Medicine

## 2023-10-09 NOTE — Telephone Encounter (Signed)
 Requested medications are due for refill today.  yes  Requested medications are on the active medications list.  yes  Last refill. 10/06/2022 #90 3 rf  Future visit scheduled.   no  Notes to clinic.  Pt is overdue for a CPE.    Requested Prescriptions  Pending Prescriptions Disp Refills   pantoprazole  (PROTONIX ) 40 MG tablet [Pharmacy Med Name: PANTOPRAZOLE  SOD DR 40 MG TAB] 90 tablet 3    Sig: TAKE 1 TABLET BY MOUTH EVERY DAY     Gastroenterology: Proton Pump Inhibitors Passed - 10/09/2023 12:57 PM      Passed - Valid encounter within last 12 months    Recent Outpatient Visits           8 months ago Acute maxillary sinusitis, recurrence not specified   [facility] Pickard, Butler DASEN, MD   1 year ago Pure hypercholesterolemia   [facility] Pickard, Butler DASEN, MD   1 year ago Acute bacterial rhinosinusitis   [facility] Kayla Jeoffrey RAMAN, FNP

## 2023-10-19 ENCOUNTER — Inpatient Hospital Stay: Attending: Hematology & Oncology

## 2023-10-19 ENCOUNTER — Ambulatory Visit: Payer: Self-pay | Admitting: Hematology & Oncology

## 2023-10-19 ENCOUNTER — Encounter: Payer: Self-pay | Admitting: Hematology & Oncology

## 2023-10-19 ENCOUNTER — Inpatient Hospital Stay (HOSPITAL_BASED_OUTPATIENT_CLINIC_OR_DEPARTMENT_OTHER): Admitting: Hematology & Oncology

## 2023-10-19 VITALS — BP 105/65 | HR 60 | Temp 98.2°F | Resp 19 | Ht 68.0 in | Wt 144.4 lb

## 2023-10-19 DIAGNOSIS — Z171 Estrogen receptor negative status [ER-]: Secondary | ICD-10-CM

## 2023-10-19 DIAGNOSIS — Z170 Estrogen receptor positive status [ER+]: Secondary | ICD-10-CM | POA: Insufficient documentation

## 2023-10-19 DIAGNOSIS — Z79811 Long term (current) use of aromatase inhibitors: Secondary | ICD-10-CM | POA: Insufficient documentation

## 2023-10-19 DIAGNOSIS — Z9011 Acquired absence of right breast and nipple: Secondary | ICD-10-CM | POA: Insufficient documentation

## 2023-10-19 DIAGNOSIS — E032 Hypothyroidism due to medicaments and other exogenous substances: Secondary | ICD-10-CM

## 2023-10-19 DIAGNOSIS — C50911 Malignant neoplasm of unspecified site of right female breast: Secondary | ICD-10-CM | POA: Insufficient documentation

## 2023-10-19 DIAGNOSIS — Z79899 Other long term (current) drug therapy: Secondary | ICD-10-CM | POA: Insufficient documentation

## 2023-10-19 DIAGNOSIS — C50611 Malignant neoplasm of axillary tail of right female breast: Secondary | ICD-10-CM

## 2023-10-19 DIAGNOSIS — Z9221 Personal history of antineoplastic chemotherapy: Secondary | ICD-10-CM | POA: Insufficient documentation

## 2023-10-19 DIAGNOSIS — E7800 Pure hypercholesterolemia, unspecified: Secondary | ICD-10-CM | POA: Diagnosis not present

## 2023-10-19 LAB — CBC WITH DIFFERENTIAL (CANCER CENTER ONLY)
Abs Immature Granulocytes: 0.02 K/uL (ref 0.00–0.07)
Basophils Absolute: 0.1 K/uL (ref 0.0–0.1)
Basophils Relative: 1 %
Eosinophils Absolute: 0.1 K/uL (ref 0.0–0.5)
Eosinophils Relative: 1 %
HCT: 44.4 % (ref 36.0–46.0)
Hemoglobin: 15 g/dL (ref 12.0–15.0)
Immature Granulocytes: 0 %
Lymphocytes Relative: 49 %
Lymphs Abs: 3.5 K/uL (ref 0.7–4.0)
MCH: 30.7 pg (ref 26.0–34.0)
MCHC: 33.8 g/dL (ref 30.0–36.0)
MCV: 90.8 fL (ref 80.0–100.0)
Monocytes Absolute: 0.4 K/uL (ref 0.1–1.0)
Monocytes Relative: 6 %
Neutro Abs: 3.1 K/uL (ref 1.7–7.7)
Neutrophils Relative %: 43 %
Platelet Count: 217 K/uL (ref 150–400)
RBC: 4.89 MIL/uL (ref 3.87–5.11)
RDW: 12.7 % (ref 11.5–15.5)
WBC Count: 7.2 K/uL (ref 4.0–10.5)
nRBC: 0 % (ref 0.0–0.2)

## 2023-10-19 LAB — CMP (CANCER CENTER ONLY)
ALT: 23 U/L (ref 0–44)
AST: 22 U/L (ref 15–41)
Albumin: 4.6 g/dL (ref 3.5–5.0)
Alkaline Phosphatase: 101 U/L (ref 38–126)
Anion gap: 14 (ref 5–15)
BUN: 19 mg/dL (ref 6–20)
CO2: 22 mmol/L (ref 22–32)
Calcium: 9.8 mg/dL (ref 8.9–10.3)
Chloride: 106 mmol/L (ref 98–111)
Creatinine: 0.94 mg/dL (ref 0.44–1.00)
GFR, Estimated: >60 mL/min (ref >60)
Glucose, Bld: 123 mg/dL — ABNORMAL HIGH (ref 70–99)
Potassium: 4.1 mmol/L (ref 3.5–5.1)
Sodium: 142 mmol/L (ref 135–145)
Total Bilirubin: 0.5 mg/dL (ref 0.0–1.2)
Total Protein: 6.9 g/dL (ref 6.5–8.1)

## 2023-10-19 LAB — TSH: TSH: 5.66 u[IU]/mL — ABNORMAL HIGH (ref 0.350–4.500)

## 2023-10-19 LAB — LIPID PANEL
Cholesterol: 275 mg/dL — ABNORMAL HIGH (ref 0–200)
HDL: 41 mg/dL (ref >40)
LDL Cholesterol: 208 mg/dL — ABNORMAL HIGH (ref 0–99)
Total CHOL/HDL Ratio: 6.7 ratio
Triglycerides: 130 mg/dL (ref <150)
VLDL: 26 mg/dL (ref 0–40)

## 2023-10-19 NOTE — Progress Notes (Signed)
 Hematology and Oncology Follow Up Visit  [facility] [phone] [birth year] 58 y.o. 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 daily-start on 05/13/2022 --on hold starting 12/15/2022  --restarted on 01/22/2023     Interim History:  Ms.  Colgate is back for followup.  Currently, she seems to be managing fairly well.  Thankfully, she has not had any issues with her joints outside of her left shoulder.  It sounds like she has a rotator cuff tear.  She does not want to have any surgery.  She is doing well on the Aromasin .  She has had no problems with arthralgias or myalgias.  She does have her cholesterol issues.  I know that she is being followed by her family doctor.  She really wants to avoid cholesterol medications.  She has had breast biopsies.  I think she had a breast biopsy on 06/24/2023.  The pathology report [id number]) showed benign breast parenchyma with fat necrosis.  Thankfully, there is no issues with respect to recurrent breast cancer.  She has had no issues with nausea or vomiting.  She has had no change in bowel or bladder habits.  She has had no mouth sores.  Overall, I will have to say that her performance status is probably ECOG 1.    Medications:  Current Outpatient Medications:    acetaminophen  (TYLENOL ) 500 MG tablet, Take 1,000 mg by mouth every 6 (six) hours as needed for moderate pain or mild pain., Disp: , Rfl:    butalbital -acetaminophen -caffeine  (FIORICET, ESGIC) 50-325-40 MG per tablet, TAKE 1 TABLET BY MOUTH EVERY 6 HOURS AS NEEDED FOR HEADACHE, Disp: 20 tablet, Rfl: 0   Cholecalciferol (VITAMIN D  PO), Take 1,000 Units by mouth daily., Disp: , Rfl:    exemestane  (AROMASIN ) 25 MG tablet, TAKE 1 TABLET  (25 MG TOTAL) BY MOUTH DAILY AFTER BREAKFAST., Disp: 90 tablet, Rfl: 4   Fezolinetant  (VEOZAH ) 45 MG TABS, Take 1 tablet (45 mg total) by mouth daily., Disp: 90 tablet, Rfl: 3   fluticasone  (FLONASE ) 50 MCG/ACT nasal spray, INHALE 2 SPRASY IN EACH NOSTRIL ONCE DAILY, Disp: 16 g, Rfl: 11   ibuprofen  (ADVIL ) 600 MG tablet, Take 600 mg by mouth every 8 (eight) hours., Disp: , Rfl:    pantoprazole  (PROTONIX ) 40 MG tablet, Take 1 tablet (40 mg total) by mouth daily. (Patient taking differently: Take 40 mg by mouth daily as needed.), Disp: 90 tablet, Rfl: 3   promethazine  (PHENERGAN ) 25 MG tablet, Take 1 tablet (25 mg total) by mouth every 8 (eight) hours as needed for nausea or vomiting., Disp: 30 tablet, Rfl: 0   rizatriptan  (MAXALT ) 10 MG tablet, TAKE 1 TABLET BY MOUTH EVERY DAY AS NEEDED FOR MIGRANE, Disp: 6 tablet, Rfl: 5   UNITHROID  75 MCG tablet, TAKE 1 TABLET BY MOUTH DAILY BEFORE BREAKFAST., Disp: 90 tablet, Rfl: 1  Allergies:  Allergies  Allergen Reactions   Anesthetics, Ester Nausea And Vomiting   Erythromycin Nausea And Vomiting    Past Medical History, Surgical history, Social history, and Family History were reviewed and updated.  Review of Systems: Review of Systems  Constitutional: Negative.   HENT:  Positive for congestion.   Eyes: Negative.   Respiratory: Negative.    Cardiovascular: Negative.   Gastrointestinal:  Negative.   Genitourinary: Negative.   Musculoskeletal: Negative.   Skin: Negative.   Neurological: Negative.   Endo/Heme/Allergies: Negative.   Psychiatric/Behavioral: Negative.       Physical Exam:  Vital signs are temperature 98.2.  Pulse 60.  Blood pressure 105/65.  Weight is 144 pounds.    Physical Exam Vitals reviewed.  Constitutional:      Comments: Her breast exam shows right mastectomy.  She has reconstruction on this now.  This is healing.  There is no erythema.  There is no warmth or swelling.  .  There is no right axillary adenopathy.  Left  breast is unremarkable.  There is no mass in the left breast.  There is no left axillary adenopathy.    HENT:     Head: Normocephalic and atraumatic.  Eyes:     Pupils: Pupils are equal, round, and reactive to light.  Cardiovascular:     Rate and Rhythm: Normal rate and regular rhythm.     Heart sounds: Normal heart sounds.  Pulmonary:     Effort: Pulmonary effort is normal.     Breath sounds: Normal breath sounds.  Abdominal:     General: Bowel sounds are normal.     Palpations: Abdomen is soft.     Comments: Abdominal exam shows a well-healed reconstruction scar in the pelvic area.  She has a umbilical surgery scar that is healing.  There is no masses.  There is no tenderness.  There is no fluid wave.  Musculoskeletal:        General: No tenderness or deformity. Normal range of motion.     Cervical back: Normal range of motion.     Comments: Extremities shows a well-healed surgical scar in the flexor aspect of the left forearm.  She also has a surgical scar in the left elbow.  These are well-healed.  She has good pulses.  She has somewhat decreased range of motion of the fingers and wrist.    Lymphadenopathy:     Cervical: No cervical adenopathy.  Skin:    General: Skin is warm and dry.     Findings: No erythema or rash.  Neurological:     Mental Status: She is alert and oriented to person, place, and time.  Psychiatric:        Behavior: Behavior normal.        Thought Content: Thought content normal.        Judgment: Judgment normal.     Lab Results  Component Value Date   WBC 7.2 10/19/2023   HGB 15.0 10/19/2023   HCT 44.4 10/19/2023   MCV 90.8 10/19/2023   PLT 217 10/19/2023     Chemistry      Component Value Date/Time   NA 147 (H) 05/04/2023 0754   NA 140 09/26/2016 0743   NA 143 09/21/2015 0854   K 4.9 05/04/2023 0754   K 3.8 09/26/2016 0743   K 4.4 09/21/2015 0854   CL 108 05/04/2023 0754   CL 104 09/26/2016 0743   CO2 29 05/04/2023 0754   CO2 30  09/26/2016 0743   CO2 28 09/21/2015 0854   BUN 16 05/04/2023 0754   BUN 14 09/26/2016 0743   BUN 13.9 09/21/2015 0854   CREATININE 0.96 05/04/2023 0754   CREATININE 0.87 01/01/2018 1055   CREATININE 0.8 09/21/2015 0854      Component Value Date/Time   CALCIUM  9.9 05/04/2023 0754   CALCIUM  9.4 09/26/2016 0743   CALCIUM  10.1 09/21/2015 0854  ALKPHOS 90 05/04/2023 0754   ALKPHOS 80 09/26/2016 0743   ALKPHOS 98 09/21/2015 0854   AST 16 05/04/2023 0754   AST 37 (H) 09/21/2015 0854   ALT 19 05/04/2023 0754   ALT 35 09/26/2016 0743   ALT 57 (H) 09/21/2015 0854   BILITOT 0.7 05/04/2023 0754   BILITOT 0.53 09/21/2015 0854      Impression and Plan: Ms. Navarrete is a 58 year old white female with a history of stage I ductal carcinoma of the right breast. She was diagnosed 22 years ago. Her tumor was ER positive. I  think that  she is cured.  Now, she has had a second breast cancer.  Again I feel this is not a recurrence in the original breast.  I feel this is a second breast cancer.  Again, she completed her adjuvant chemotherapy.  She went on to [place] to have surgery for the reconstruction.  She did well with this.  Hopefully, she will have a quiet Fall.  I know that she is still involved with education.  It sounds like she is going to try to avoid having surgery for the left shoulder.  She actually had pretty decent range of motion of the shoulder.  We are always following her cholesterol levels.  When we last saw her, her cholesterol was 257.  Her cholesterol/HDL ratio unfortunately was 6.  This is trending higher and higher.  She has incredibly low triglycerides.  I will still plan to get her back in another 6 months.  We will get her through the Holiday season.    9/8/20259:26 AM

## 2023-11-16 ENCOUNTER — Ambulatory Visit: Payer: Self-pay

## 2023-11-16 NOTE — Telephone Encounter (Signed)
 FYI Only or Action Required?: FYI only for provider.  Patient was last seen in primary care on 02/09/2023 by Duanne Butler DASEN, MD.  Called Nurse Triage reporting Sinus Problem.  Symptoms began several days ago.  Interventions attempted: OTC medications: advil  sinus.  Symptoms are: unchanged.  Triage Disposition: See Physician Within 24 Hours  Patient/caregiver understands and will follow disposition?: Yes   Copied from CRM #[id number]. Topic: Clinical - Red Word Triage >> Nov 16, 2023  9:54 AM Willma SAUNDERS wrote: Red Word that prompted transfer to Nurse Triage: Patient states she hasn't been feeling well since Saturday morning. Has sore throat, headache, congestion, cough, and pain in both ears but left worse. Reason for Disposition  Earache  Answer Assessment - Initial Assessment Questions States COVID negative. Only wants to see Dr. Duanne.   1. LOCATION: Where does it hurt?      Cheeks mostly, across the eye browns 2. ONSET: When did the sinus pain start?  (e.g., hours, days)      Saturday morning 3. SEVERITY: How bad is the pain?   (Scale 0-10; or none, mild, moderate or severe)     Not really pain, just a discomfort 3 4. RECURRENT SYMPTOM: Have you ever had sinus problems before? If Yes, ask: When was the last time? and What happened that time?      Yes all the time, 2 surgeries for sinus issues 5. NASAL CONGESTION: Is the nose blocked? If Yes, ask: Can you open it or must you breathe through your mouth?     Yes, having to breathe out of mouth 6. NASAL DISCHARGE: Do you have discharge from your nose? If so ask, What color?     Yes, most times clear, sometimes greenish color 7. FEVER: Do you have a fever? If Yes, ask: What is it, how was it measured, and when did it start?      99.2 is the hightest 8. OTHER SYMPTOMS: Do you have any other symptoms? (e.g., sore throat, cough, earache, difficulty breathing)     Sore throat, cough w/greenish-yellow  phlegm, earache more the left than the right  Protocols used: Sinus Pain or Congestion-A-AH

## 2023-11-17 ENCOUNTER — Ambulatory Visit: Admitting: Family Medicine

## 2023-11-17 VITALS — BP 118/62 | HR 77 | Temp 98.2°F | Ht 68.0 in | Wt 145.4 lb

## 2023-11-17 DIAGNOSIS — J069 Acute upper respiratory infection, unspecified: Secondary | ICD-10-CM

## 2023-11-17 MED ORDER — HYDROCODONE BIT-HOMATROP MBR 5-1.5 MG/5ML PO SOLN: 5.0000 mL | Freq: Three times a day (TID) | ORAL | 0 refills | Status: AC | PRN

## 2023-11-17 MED ORDER — PANTOPRAZOLE SODIUM 40 MG PO TBEC: 40.0000 mg | DELAYED_RELEASE_TABLET | Freq: Every day | ORAL | 3 refills | Status: AC

## 2023-11-17 MED ORDER — AMOXICILLIN 875 MG PO TABS: 875.0000 mg | ORAL_TABLET | Freq: Two times a day (BID) | ORAL | 0 refills | Status: AC

## 2023-11-17 NOTE — Progress Notes (Signed)
 Subjective:    Patient ID: Rachel [name], female    DOB: 01-22-1966, 58 y.o.   MRN: [id number]  Patient reports a 4-day history of rhinorrhea, frontal and maxillary sinus pressure bilaterally, headaches.  She also reports persistent cough, she has sore throat.  She works in school.  She has been exposed to several different upper respiratory infections.  She has tested negative for COVID at home. 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  Prior T score -2.5.  DEXA stable from prior study.   Personal history of chemotherapy 2001   Personal history of radiation therapy 2001   Pneumonia    Had 12-2017   PONV (postoperative nausea and vomiting)    Past Surgical History:  Procedure Laterality Date   BREAST BIOPSY Left 06/02/2023   US  LT BREAST BX W LOC DEV EA ADD LESION IMG BX SPEC US  GUIDE 06/02/2023 [facility]   BREAST BIOPSY Left 06/02/2023   US  LT BREAST BX W LOC DEV 1ST LESION IMG BX SPEC US  GUIDE 06/02/2023 [facility]   BREAST BIOPSY Left 06/24/2023   MM LT BREAST BX W LOC DEV 1ST LESION IMAGE BX SPEC STEREO GUIDE 06/24/2023 [facility]   BREAST EXCISIONAL BIOPSY Left 2021   BREAST LUMPECTOMY Right 2001   BREAST RECONSTRUCTION  12/17/2021   DIEP flap   CESAREAN SECTION  1991   COLONOSCOPY     MASTECTOMY W/ SENTINEL NODE BIOPSY Right 06/25/2021   Procedure: RIGHT MASTECTOMY WITH SENTINEL LYMPH NODE BIOPSY;  Surgeon: Aron Shoulders, MD;  Location: [facility];  Service: General;  Laterality: Right;   NASAL SINUS SURGERY Right  02/20/2020   Procedure: ENDOSCOPIC SINUS SURGERY WITH RIGHT ETHMOIDECTOMY AND RIGHT MAXILLARY OSTIA ENLARGEMENT WITH FUSION;  Surgeon: Ethyl Lonni BRAVO, MD;  Location: [facility];  Service: ENT;  Laterality: Right;   OPEN REDUCTION INTERNAL FIXATION (ORIF) DISTAL RADIAL FRACTURE Left 10/21/2022   Procedure: OPEN REDUCTION INTERNAL FIXATION (ORIF) DISTAL RADIUS FRACTURE;  Surgeon: Alyse Agent, MD;  Location: [facility];  Service: Orthopedics;  Laterality: Left;   PELVIC LAPAROSCOPY     endometriosis   PORTACATH PLACEMENT N/A 08/07/2021   Procedure: PORT PLACEMENT WITH ULTRASOUND GUIDANCE;  Surgeon: Aron Shoulders, MD;  Location: [facility];  Service: General;  Laterality: N/A;   SINUS ENDO WITH FUSION Right 02/20/2020   Procedure: SINUS ENDO WITH FUSION;  Surgeon: Ethyl Lonni BRAVO, MD;  Location: [facility];  Service: ENT;  Laterality: Right;   TONSILLECTOMY  1980   UPPER GASTROINTESTINAL ENDOSCOPY     UPPER GI ENDOSCOPY     WISDOM TOOTH EXTRACTION  1986   Current Outpatient Medications on File Prior to Visit  Medication Sig Dispense Refill   acetaminophen  (TYLENOL ) 500 MG tablet Take 1,000 mg by mouth every 6 (six) hours as needed for moderate pain or mild pain.     butalbital -acetaminophen -caffeine  (FIORICET, ESGIC) 50-325-40 MG per tablet TAKE 1 TABLET BY  MOUTH EVERY 6 HOURS AS NEEDED FOR HEADACHE 20 tablet 0   Cholecalciferol (VITAMIN D  PO) Take 1,000 Units by mouth daily.     exemestane  (AROMASIN ) 25 MG tablet TAKE 1 TABLET (25 MG TOTAL) BY MOUTH DAILY AFTER BREAKFAST. 90 tablet 4   Fezolinetant  (VEOZAH ) 45 MG TABS Take 1 tablet (45 mg total) by mouth daily. 90 tablet 3   fluticasone  (FLONASE ) 50 MCG/ACT nasal spray INHALE 2 SPRASY IN EACH NOSTRIL ONCE DAILY 16 g 11   ibuprofen  (ADVIL ) 600 MG tablet Take 600 mg by mouth every 8 (eight) hours.     pantoprazole  (PROTONIX ) 40 MG tablet Take 1 tablet (40 mg total) by mouth daily. (Patient  taking differently: Take 40 mg by mouth daily as needed.) 90 tablet 3   promethazine  (PHENERGAN ) 25 MG tablet Take 1 tablet (25 mg total) by mouth every 8 (eight) hours as needed for nausea or vomiting. 30 tablet 0   rizatriptan  (MAXALT ) 10 MG tablet TAKE 1 TABLET BY MOUTH EVERY DAY AS NEEDED FOR MIGRANE 6 tablet 5   UNITHROID  75 MCG tablet TAKE 1 TABLET BY MOUTH DAILY BEFORE BREAKFAST. 90 tablet 1   [DISCONTINUED] prochlorperazine  (COMPAZINE ) 10 MG tablet Take 1 tablet (10 mg total) by mouth every 6 (six) hours as needed (Nausea or vomiting). 30 tablet 1   No current facility-administered medications on file prior to visit.   Allergies  Allergen Reactions   Anesthetics, Ester Nausea And Vomiting   Erythromycin Nausea And Vomiting   Social History   Socioeconomic History   Marital status: Married    Spouse name: Not on file   Number of children: Not on file   Years of education: Not on file   Highest education level: Master's degree (e.g., MA, MS, MEng, MEd, MSW, MBA)  Occupational History   Not on file  Tobacco Use   Smoking status: Never   Smokeless tobacco: Never   Tobacco comments:    never used tobacco  Vaping Use   Vaping status: Never Used  Substance and Sexual Activity   Alcohol use: Not Currently    Alcohol/week: 0.0 standard drinks of alcohol   Drug use: No   Sexual activity: Yes    Birth control/protection: Post-menopausal    Comment: 1st intercourse 58 yo-Fewer than 5 partners  Other Topics Concern   Not on file  Social History Narrative   Not on file   Social Drivers of Health   Financial Resource Strain: Patient Declined (11/17/2023)   Overall Financial Resource Strain (CARDIA)    Difficulty of Paying Living Expenses: Patient declined  Food Insecurity: Patient Declined (11/17/2023)   Hunger Vital Sign    Worried About Running Out of Food in the Last Year: Patient declined    Ran Out of Food in the Last Year: Patient declined  Transportation Needs:  Patient Declined (11/17/2023)   PRAPARE - Administrator, Civil Service (Medical): Patient declined    Lack of Transportation (Non-Medical): Patient declined  Physical Activity: Insufficiently Active (11/17/2023)   Exercise Vital Sign    Days of Exercise per Week: 3 days    Minutes of Exercise per Session: 20 min  Stress: No Stress Concern Present (11/17/2023)   [employer] of Occupational Health - Occupational Stress Questionnaire    Feeling of Stress: Not at all  Social Connections: Unknown (11/17/2023)   Social Connection and Isolation Panel    Frequency of Communication with Friends and Family: Patient declined    Frequency of  Social Gatherings with Friends and Family: Patient declined    Attends Religious Services: Patient declined    Database administrator or Organizations: Patient declined    Attends Engineer, structural: Not on file    Marital Status: Married  Catering manager Violence: Not on file      Review of Systems  All other systems reviewed and are negative.      Objective:   Physical Exam Vitals reviewed.  Constitutional:      General: She is not in acute distress.    Appearance: Normal appearance. She is normal weight. She is not ill-appearing, toxic-appearing or diaphoretic.  HENT:     Head: Normocephalic and atraumatic.     Right Ear: Tympanic membrane, ear canal and external ear normal. There is no impacted cerumen.     Left Ear: Tympanic membrane, ear canal and external ear normal. There is no impacted cerumen.     Nose: Congestion and rhinorrhea present.     Right Turbinates: Swollen.     Left Turbinates: Swollen.     Right Sinus: Maxillary sinus tenderness and frontal sinus tenderness present.     Left Sinus: Maxillary sinus tenderness and frontal sinus tenderness present.     Mouth/Throat:     Pharynx: No oropharyngeal exudate or posterior oropharyngeal erythema.  Eyes:     Conjunctiva/sclera: Conjunctivae normal.   Cardiovascular:     Rate and Rhythm: Normal rate and regular rhythm.     Pulses: Normal pulses.     Heart sounds: Normal heart sounds. No murmur heard.    No friction rub. No gallop.  Pulmonary:     Effort: Pulmonary effort is normal. No respiratory distress.     Breath sounds: Normal breath sounds. No stridor. No wheezing, rhonchi or rales.  Chest:     Chest wall: No tenderness.  Musculoskeletal:     Cervical back: Normal range of motion.  Lymphadenopathy:     Cervical: No cervical adenopathy.  Neurological:     Mental Status: She is alert.           Assessment & Plan:  Viral URI  Patient has symptoms of a viral upper respiratory infection as well as maxillary sinusitis.  I explained to the patient I believe this is most likely viral.  I have recommended that she take Sudafed for nasal congestion or Coricidin.  She can use Flonase  2 sprays times daily.  Also recommended nasal saline rinses.  She can use Hycodan 1 teaspoon every 6 hours as needed for cough.  Anticipate gradual improvement over the next 7 days.  Given her complicated medical history, if symptoms worsen, I gave her a prescription for amoxicillin  to take for a sinus infection.  However, I recommended she hold onto that prescription until 7 days at least has passed or unless symptoms drastically worsen.

## 2023-11-24 ENCOUNTER — Encounter (HOSPITAL_BASED_OUTPATIENT_CLINIC_OR_DEPARTMENT_OTHER): Payer: Self-pay | Admitting: Obstetrics & Gynecology

## 2023-11-24 ENCOUNTER — Other Ambulatory Visit (HOSPITAL_BASED_OUTPATIENT_CLINIC_OR_DEPARTMENT_OTHER): Payer: Self-pay | Admitting: Obstetrics & Gynecology

## 2023-11-24 DIAGNOSIS — R232 Flushing: Secondary | ICD-10-CM

## 2023-11-24 MED ORDER — VEOZAH 45 MG PO TABS: 1.0000 | ORAL_TABLET | Freq: Every day | ORAL | 3 refills | Status: AC

## 2023-11-24 NOTE — Telephone Encounter (Signed)
 Prior-Authorization started. tbw

## 2023-12-04 ENCOUNTER — Inpatient Hospital Stay: Attending: Hematology & Oncology

## 2023-12-04 DIAGNOSIS — Z9011 Acquired absence of right breast and nipple: Secondary | ICD-10-CM | POA: Insufficient documentation

## 2023-12-04 DIAGNOSIS — Z79811 Long term (current) use of aromatase inhibitors: Secondary | ICD-10-CM | POA: Diagnosis not present

## 2023-12-04 DIAGNOSIS — E039 Hypothyroidism, unspecified: Secondary | ICD-10-CM | POA: Insufficient documentation

## 2023-12-04 DIAGNOSIS — E032 Hypothyroidism due to medicaments and other exogenous substances: Secondary | ICD-10-CM

## 2023-12-04 DIAGNOSIS — C50911 Malignant neoplasm of unspecified site of right female breast: Secondary | ICD-10-CM | POA: Diagnosis present

## 2023-12-04 DIAGNOSIS — Z170 Estrogen receptor positive status [ER+]: Secondary | ICD-10-CM | POA: Insufficient documentation

## 2023-12-04 DIAGNOSIS — E7800 Pure hypercholesterolemia, unspecified: Secondary | ICD-10-CM | POA: Insufficient documentation

## 2023-12-04 DIAGNOSIS — Z171 Estrogen receptor negative status [ER-]: Secondary | ICD-10-CM

## 2023-12-04 DIAGNOSIS — Z79899 Other long term (current) drug therapy: Secondary | ICD-10-CM | POA: Insufficient documentation

## 2023-12-04 DIAGNOSIS — Z9221 Personal history of antineoplastic chemotherapy: Secondary | ICD-10-CM | POA: Insufficient documentation

## 2023-12-04 LAB — CBC WITH DIFFERENTIAL (CANCER CENTER ONLY)
Abs Immature Granulocytes: 0 K/uL (ref 0.00–0.07)
Basophils Absolute: 0.1 K/uL (ref 0.0–0.1)
Basophils Relative: 1 %
Eosinophils Absolute: 0.2 K/uL (ref 0.0–0.5)
Eosinophils Relative: 3 %
HCT: 43.5 % (ref 36.0–46.0)
Hemoglobin: 14.7 g/dL (ref 12.0–15.0)
Immature Granulocytes: 0 %
Lymphocytes Relative: 47 %
Lymphs Abs: 2.6 K/uL (ref 0.7–4.0)
MCH: 30.9 pg (ref 26.0–34.0)
MCHC: 33.8 g/dL (ref 30.0–36.0)
MCV: 91.4 fL (ref 80.0–100.0)
Monocytes Absolute: 0.3 K/uL (ref 0.1–1.0)
Monocytes Relative: 6 %
Neutro Abs: 2.4 K/uL (ref 1.7–7.7)
Neutrophils Relative %: 43 %
Platelet Count: 231 K/uL (ref 150–400)
RBC: 4.76 MIL/uL (ref 3.87–5.11)
RDW: 12.6 % (ref 11.5–15.5)
WBC Count: 5.5 K/uL (ref 4.0–10.5)
nRBC: 0 % (ref 0.0–0.2)

## 2023-12-04 LAB — LIPID PANEL
Cholesterol: 259 mg/dL — ABNORMAL HIGH (ref 0–200)
HDL: 46 mg/dL (ref >40)
LDL Cholesterol: 186 mg/dL — ABNORMAL HIGH (ref 0–99)
Total CHOL/HDL Ratio: 5.6 ratio
Triglycerides: 135 mg/dL (ref <150)
VLDL: 27 mg/dL (ref 0–40)

## 2023-12-04 LAB — CMP (CANCER CENTER ONLY)
ALT: 41 U/L (ref 0–44)
AST: 28 U/L (ref 15–41)
Albumin: 4.4 g/dL (ref 3.5–5.0)
Alkaline Phosphatase: 103 U/L (ref 38–126)
Anion gap: 8 (ref 5–15)
BUN: 17 mg/dL (ref 6–20)
CO2: 27 mmol/L (ref 22–32)
Calcium: 9.9 mg/dL (ref 8.9–10.3)
Chloride: 107 mmol/L (ref 98–111)
Creatinine: 0.85 mg/dL (ref 0.44–1.00)
GFR, Estimated: >60 mL/min (ref >60)
Glucose, Bld: 111 mg/dL — ABNORMAL HIGH (ref 70–99)
Potassium: 5.7 mmol/L — ABNORMAL HIGH (ref 3.5–5.1)
Sodium: 142 mmol/L (ref 135–145)
Total Bilirubin: 0.4 mg/dL (ref 0.0–1.2)
Total Protein: 6.5 g/dL (ref 6.5–8.1)

## 2023-12-04 LAB — TSH: TSH: 9.56 u[IU]/mL — ABNORMAL HIGH (ref 0.350–4.500)

## 2023-12-05 ENCOUNTER — Ambulatory Visit: Payer: Self-pay | Admitting: Hematology & Oncology

## 2023-12-07 ENCOUNTER — Other Ambulatory Visit: Payer: Self-pay | Admitting: *Deleted

## 2023-12-07 DIAGNOSIS — E032 Hypothyroidism due to medicaments and other exogenous substances: Secondary | ICD-10-CM

## 2023-12-07 DIAGNOSIS — E034 Atrophy of thyroid (acquired): Secondary | ICD-10-CM

## 2023-12-07 MED ORDER — LEVOTHYROXINE SODIUM 100 MCG PO TABS: 100.0000 ug | ORAL_TABLET | Freq: Every day | ORAL | 3 refills | Status: DC

## 2023-12-09 ENCOUNTER — Other Ambulatory Visit: Payer: Self-pay | Admitting: Hematology & Oncology

## 2023-12-09 DIAGNOSIS — E032 Hypothyroidism due to medicaments and other exogenous substances: Secondary | ICD-10-CM

## 2023-12-09 DIAGNOSIS — E034 Atrophy of thyroid (acquired): Secondary | ICD-10-CM

## 2024-04-15 ENCOUNTER — Other Ambulatory Visit

## 2024-04-15 ENCOUNTER — Ambulatory Visit: Admitting: Hematology & Oncology

## 2024-08-02 ENCOUNTER — Ambulatory Visit (HOSPITAL_BASED_OUTPATIENT_CLINIC_OR_DEPARTMENT_OTHER): Admitting: Obstetrics & Gynecology
